# Patient Record
Sex: Female | Born: 1950 | Race: White | Hispanic: No | Smoking: Current every day smoker
Health system: Southern US, Community
[De-identification: ages and names within clinical notes are randomized; demographics above are authoritative.]

## PROBLEM LIST (undated history)

## (undated) DIAGNOSIS — C801 Malignant (primary) neoplasm, unspecified: Secondary | ICD-10-CM

## (undated) DIAGNOSIS — F319 Bipolar disorder, unspecified: Secondary | ICD-10-CM

## (undated) DIAGNOSIS — E785 Hyperlipidemia, unspecified: Secondary | ICD-10-CM

## (undated) DIAGNOSIS — F209 Schizophrenia, unspecified: Secondary | ICD-10-CM

## (undated) DIAGNOSIS — E041 Nontoxic single thyroid nodule: Secondary | ICD-10-CM

## (undated) DIAGNOSIS — I517 Cardiomegaly: Secondary | ICD-10-CM

## (undated) DIAGNOSIS — N809 Endometriosis, unspecified: Secondary | ICD-10-CM

## (undated) DIAGNOSIS — E559 Vitamin D deficiency, unspecified: Secondary | ICD-10-CM

## (undated) DIAGNOSIS — F259 Schizoaffective disorder, unspecified: Secondary | ICD-10-CM

## (undated) DIAGNOSIS — F329 Major depressive disorder, single episode, unspecified: Secondary | ICD-10-CM

## (undated) DIAGNOSIS — M810 Age-related osteoporosis without current pathological fracture: Secondary | ICD-10-CM

## (undated) DIAGNOSIS — C50919 Malignant neoplasm of unspecified site of unspecified female breast: Secondary | ICD-10-CM

## (undated) DIAGNOSIS — F32A Depression, unspecified: Secondary | ICD-10-CM

## (undated) DIAGNOSIS — J439 Emphysema, unspecified: Secondary | ICD-10-CM

## (undated) HISTORY — DX: Bipolar disorder, unspecified: F31.9

## (undated) HISTORY — DX: Schizoaffective disorder, unspecified: F25.9

## (undated) HISTORY — PX: TENDON REPAIR: SHX5111

## (undated) HISTORY — PX: ABDOMINAL HYSTERECTOMY: SHX81

## (undated) HISTORY — PX: TONSILLECTOMY: SHX5217

## (undated) HISTORY — DX: Nontoxic single thyroid nodule: E04.1

## (undated) HISTORY — DX: Vitamin D deficiency, unspecified: E55.9

## (undated) HISTORY — DX: Major depressive disorder, single episode, unspecified: F32.9

## (undated) HISTORY — PX: DILATION AND CURETTAGE OF UTERUS: SHX78

## (undated) HISTORY — DX: Depression, unspecified: F32.A

## (undated) HISTORY — DX: Hyperlipidemia, unspecified: E78.5

## (undated) HISTORY — DX: Schizophrenia, unspecified: F20.9

## (undated) HISTORY — PX: TUBAL LIGATION: SHX77

## (undated) HISTORY — DX: Age-related osteoporosis without current pathological fracture: M81.0

## (undated) HISTORY — DX: Cardiomegaly: I51.7

## (undated) HISTORY — DX: Emphysema, unspecified: J43.9

## (undated) HISTORY — DX: Endometriosis, unspecified: N80.9

## (undated) HISTORY — DX: Malignant (primary) neoplasm, unspecified: C80.1

---

## 2014-04-20 HISTORY — PX: MASTECTOMY: SHX3

## 2014-12-04 HISTORY — PX: DIAGNOSTIC MAMMOGRAM: HXRAD719

## 2014-12-11 ENCOUNTER — Encounter: Payer: Self-pay | Admitting: General Surgery

## 2014-12-11 ENCOUNTER — Ambulatory Visit (INDEPENDENT_AMBULATORY_CARE_PROVIDER_SITE_OTHER): Payer: Medicaid Other | Admitting: General Surgery

## 2014-12-11 ENCOUNTER — Other Ambulatory Visit: Payer: Medicaid Other

## 2014-12-11 VITALS — BP 110/70 | HR 84 | Resp 16 | Ht 64.0 in | Wt 123.0 lb

## 2014-12-11 DIAGNOSIS — N63 Unspecified lump in breast: Secondary | ICD-10-CM | POA: Diagnosis not present

## 2014-12-11 DIAGNOSIS — C801 Malignant (primary) neoplasm, unspecified: Secondary | ICD-10-CM

## 2014-12-11 DIAGNOSIS — N6320 Unspecified lump in the left breast, unspecified quadrant: Secondary | ICD-10-CM

## 2014-12-11 HISTORY — DX: Malignant (primary) neoplasm, unspecified: C80.1

## 2014-12-11 HISTORY — PX: BREAST BIOPSY: SHX20

## 2014-12-11 NOTE — Progress Notes (Signed)
Patient ID: Selena Pittman, female   DOB: 1951-03-10, 64 y.o.   MRN: [id number]  Chief Complaint  Patient presents with  . Other    Evaluation of Left Breast Mammogram    HPI Selena Pittman is a 64 y.o. female.  who presents for a breast evaluation. The most recent mammogram was done on 11-01-14.  She can not feel anything different in the breast.  Left breast ultrasound was 12-04-14.  She is a resident of [facility]. She is here today with her caregiver, [facility].  Mother with history of uterine cancer.   HPI  Past Medical History  Diagnosis Date  . Vitamin D deficiency   . Schizophrenia   . Bipolar affective   . Schizoaffective disorder   . Osteoporosis   . Cardiomegaly   . Emphysema of lung   . Depression   . Hyperlipidemia   . Thyroid nodule   . Endometriosis     Past Surgical History  Procedure Laterality Date  . Diagnostic mammogram  12/04/2014    Done at [facility] Imaging Category 5-Left Breast  . Tendon repair Right     hand  . Dilation and curettage of uterus    . Tubal ligation    . Tonsillectomy    . Abdominal hysterectomy      Family History  Problem Relation Age of Onset  . Cancer Mother     uterine  . Heart attack Father     Social History Social History  Substance Use Topics  . Smoking status: Current Every Day Smoker -- 0.20 packs/day for 45 years    Types: Cigarettes  . Smokeless tobacco: Never Used  . Alcohol Use: No    No Known Allergies  Current Outpatient Prescriptions  Medication Sig Dispense Refill  . alendronate (FOSAMAX) 70 MG tablet Take 70 mg by mouth once a week. Take with a full glass of water on an empty stomach.    . cetirizine (ZYRTEC) 10 MG tablet Take 10 mg by mouth daily.    . cholecalciferol (VITAMIN D) 1000 UNITS tablet Take 2,000 Units by mouth daily.    . cloZAPine (CLOZARIL) 100 MG tablet Take 400 mg by mouth daily.    . hydrOXYzine (ATARAX/VISTARIL) 25 MG tablet Take 25 mg by mouth 2 (two) times  daily.    Marland Kitchen ketoconazole (NIZORAL) 2 % cream Apply 1 application topically daily as needed for irritation.    Marland Kitchen LORazepam (ATIVAN) 0.5 MG tablet Take 0.5 mg by mouth at bedtime.    Marland Kitchen omeprazole (PRILOSEC) 20 MG capsule Take 20 mg by mouth daily.    . simvastatin (ZOCOR) 20 MG tablet Take 20 mg by mouth daily.     No current facility-administered medications for this visit.    Review of Systems Review of Systems  Constitutional: Negative.   Respiratory: Negative.   Cardiovascular: Negative.     Blood pressure 110/70, pulse 84, resp. rate 16, height 5\' 4"  (1.626 m), weight 123 lb (55.792 kg).  Physical Exam Physical Exam  Constitutional: She appears well-developed.  HENT:  Mouth/Throat: Oropharynx is clear and moist.  Eyes: Conjunctivae are normal. No scleral icterus.  Neck: Neck supple.  Cardiovascular: Normal rate, regular rhythm and normal heart sounds.   Pulmonary/Chest: Effort normal. Right breast exhibits no inverted nipple, no mass, no nipple discharge, no skin change and no tenderness. Left breast exhibits no inverted nipple, no mass, no nipple discharge, no skin change and no tenderness.  Abdominal: Soft. Bowel sounds are normal. There  is no hepatomegaly. There is no tenderness.  Lymphadenopathy:    She has no cervical adenopathy.    She has no axillary adenopathy.  Skin: Skin is warm and dry.    Data Reviewed Mammogram and ultrasound reviewed. 2 adjoining masses in UOQ of left breast, suspicious in appearance.  Assessment    Abnormal imaging of left breast.      Plan    Core biopsy indicated and discussed with pt. She was agreeable and procedure completed today. Follow up to be announced after pathology available.      PCP:  [facility]  [facility] G 12/11/2014, 1:06 PM

## 2014-12-11 NOTE — Patient Instructions (Signed)

## 2014-12-12 ENCOUNTER — Telehealth: Payer: Self-pay | Admitting: *Deleted

## 2014-12-12 NOTE — Telephone Encounter (Signed)
Call from Dr Saralyn Pilar, recent left breast biopsy showed invasive carcinoma and in situ.

## 2014-12-12 NOTE — Progress Notes (Signed)
Patient has been scheduled for an appointment on 12-20-14 at 3:30 pm.

## 2014-12-20 ENCOUNTER — Encounter: Payer: Self-pay | Admitting: General Surgery

## 2014-12-20 ENCOUNTER — Ambulatory Visit (INDEPENDENT_AMBULATORY_CARE_PROVIDER_SITE_OTHER): Payer: Medicaid Other | Admitting: General Surgery

## 2014-12-20 VITALS — BP 132/78 | HR 88 | Resp 16 | Ht 64.0 in | Wt 124.0 lb

## 2014-12-20 DIAGNOSIS — C50912 Malignant neoplasm of unspecified site of left female breast: Secondary | ICD-10-CM | POA: Diagnosis not present

## 2014-12-20 NOTE — Patient Instructions (Signed)
Patient's surgery has been scheduled for 12-31-14 at [facility].

## 2014-12-20 NOTE — Progress Notes (Signed)
Patient ID: Selena [facility]man, female   DOB: [birth year], 64 y.o.   MRN: [id number]  Chief Complaint  Patient presents with  . Follow-up    HPI Selena Pittman is a 64 y.o. female.  Here today to discuss treatment options for left breast cancer. She is here today with her son, [name] and her daughter, Maudie Mercury.   HPI  Past Medical History  Diagnosis Date  . Vitamin D deficiency   . Schizophrenia   . Bipolar affective   . Schizoaffective disorder   . Osteoporosis   . Cardiomegaly   . Emphysema of lung   . Depression   . Hyperlipidemia   . Thyroid nodule   . Endometriosis   . Cancer 12-11-14    INVASIVE MAMMARY CARCINOMA/.left breast    Past Surgical History  Procedure Laterality Date  . Diagnostic mammogram  12/04/2014    Done at [facility] Imaging Category 5-Left Breast  . Tendon repair Right     hand  . Dilation and curettage of uterus    . Tubal ligation    . Tonsillectomy    . Abdominal hysterectomy    . Breast biopsy Left 12-11-14    INVASIVE MAMMARY CARCINOMA.    Family History  Problem Relation Age of Onset  . Cancer Mother     uterine  . Heart attack Father     Social History Social History  Substance Use Topics  . Smoking status: Current Every Day Smoker -- 0.20 packs/day for 45 years    Types: Cigarettes  . Smokeless tobacco: Never Used  . Alcohol Use: No    No Known Allergies  Current Outpatient Prescriptions  Medication Sig Dispense Refill  . alendronate (FOSAMAX) 70 MG tablet Take 70 mg by mouth once a week. Take with a full glass of water on an empty stomach.    . cetirizine (ZYRTEC) 10 MG tablet Take 10 mg by mouth daily.    . cholecalciferol (VITAMIN D) 1000 UNITS tablet Take 2,000 Units by mouth daily.    . cloZAPine (CLOZARIL) 100 MG tablet Take 400 mg by mouth daily.    . hydrOXYzine (ATARAX/VISTARIL) 25 MG tablet Take 25 mg by mouth 2 (two) times daily.    Marland Kitchen ketoconazole (NIZORAL) 2 % cream Apply 1 application topically daily as needed for  irritation.    Marland Kitchen LORazepam (ATIVAN) 0.5 MG tablet Take 0.5 mg by mouth at bedtime.    Marland Kitchen omeprazole (PRILOSEC) 20 MG capsule Take 20 mg by mouth daily.    . simvastatin (ZOCOR) 20 MG tablet Take 20 mg by mouth daily.     No current facility-administered medications for this visit.    Review of Systems Review of Systems  Constitutional: Negative.   Respiratory: Negative.   Cardiovascular: Negative.   Gastrointestinal: Positive for nausea.    Blood pressure 132/78, pulse 88, resp. rate 16, height 5\' 4"  (1.626 m), weight 124 lb (56.246 kg).  Physical Exam Physical Exam  Constitutional: She is oriented to person, place, and time. She appears well-developed and well-nourished.  Pulmonary/Chest:  Left breast biopsy site with steristrips intact, small bruise noted.  Neurological: She is alert and oriented to person, place, and time.  Skin: Skin is warm and dry.  Psychiatric: She has a normal mood and affect.    Data Reviewed Pathology.  Assessment    Left breast cancer ER/PR pos, Her 2 negative     Plan    Discussed risk and benefits in detail regarding treatment options. Schedule left breast  lumpectomy with SN biopsy this was recommended and pt agreed.  Procedure explained to her   She, her son and daughter are aware of possible need for axillary dissection, role of radiation. Need for chemo can be assessed after local treatment and final staging is complete. Patient's surgery has been scheduled for 12-31-14 at [facility].   PCP:  Lorelee Market  [facility] G 12/20/2014, 4:08 PM

## 2014-12-21 ENCOUNTER — Telehealth: Payer: Self-pay

## 2014-12-21 NOTE — Telephone Encounter (Signed)
Spoke with Axel Filler, patient's caregiver and [name], the patient's son and guardian regarding her surgery. Patient is scheduled for surgery at [facility] on 12/31/14. She will pre admit at the hospital on 12/27/14 at 9:30 am. She is to arrive on 12/31/14 at 8:45 am at the Radiology desk. The patient's caregiver and son are aware of dates, times, and instructions.

## 2014-12-27 ENCOUNTER — Encounter
Admission: RE | Admit: 2014-12-27 | Discharge: 2014-12-27 | Disposition: A | Discharge status: Home / Self Care | Payer: Medicaid Other | Source: Ambulatory Visit | Attending: General Surgery | Admitting: General Surgery

## 2014-12-27 DIAGNOSIS — Z01812 Encounter for preprocedural laboratory examination: Secondary | ICD-10-CM | POA: Diagnosis not present

## 2014-12-27 DIAGNOSIS — Z01810 Encounter for preprocedural cardiovascular examination: Secondary | ICD-10-CM | POA: Diagnosis present

## 2014-12-27 DIAGNOSIS — I517 Cardiomegaly: Secondary | ICD-10-CM | POA: Insufficient documentation

## 2014-12-27 LAB — BASIC METABOLIC PANEL
Anion gap: 10 (ref 5–15)
BUN: 9 mg/dL (ref 6–20)
CALCIUM: 9.2 mg/dL (ref 8.9–10.3)
CO2: 23 mmol/L (ref 22–32)
CREATININE: 0.84 mg/dL (ref 0.44–1.00)
Chloride: 109 mmol/L (ref 101–111)
GFR calc non Af Amer: >60 mL/min (ref >60)
GLUCOSE: 180 mg/dL — AB (ref 65–99)
Potassium: 3 mmol/L — ABNORMAL LOW (ref 3.5–5.1)
Sodium: 142 mmol/L (ref 135–145)

## 2014-12-27 LAB — CBC
HEMATOCRIT: 41.7 % (ref 35.0–47.0)
Hemoglobin: 14 g/dL (ref 12.0–16.0)
MCH: 30.8 pg (ref 26.0–34.0)
MCHC: 33.6 g/dL (ref 32.0–36.0)
MCV: 91.8 fL (ref 80.0–100.0)
Platelets: 162 10*3/uL (ref 150–440)
RBC: 4.54 MIL/uL (ref 3.80–5.20)
RDW: 12.4 % (ref 11.5–14.5)
WBC: 4.1 10*3/uL (ref 3.6–11.0)

## 2014-12-27 NOTE — Patient Instructions (Addendum)
  Your procedure is scheduled on: Monday Sept 12, 2016 at 8:30am Report to radiology-nuclear medicine desk.   Remember: Instructions that are not followed completely may result in serious medical risk, up to and including death, or upon the discretion of your surgeon and anesthesiologist your surgery may need to be rescheduled.    _x___ 1. Do not eat food or drink liquids after midnight. No gum chewing or hard candies.     ____ 2. No Alcohol for 24 hours before or after surgery.   ____ 3. Bring all medications with you on the day of surgery if instructed.    _x___ 4. Notify your doctor if there is any change in your medical condition     (cold, fever, infections).     Do not wear jewelry, make-up, hairpins, clips or nail polish.  Do not wear lotions, powders, or perfumes. You may wear deodorant.  Do not shave 48 hours prior to surgery. Men may shave face and neck.  Do not bring valuables to the hospital.    [facility] is not responsible for any belongings or valuables.               Contacts, dentures or bridgework may not be worn into surgery.  Leave your suitcase in the car. After surgery it may be brought to your room.  For patients admitted to the hospital, discharge time is determined by your treatment team.   Patients discharged the day of surgery will not be allowed to drive home.    Please read over the following fact sheets that you were given:   [facility] Preparing for Surgery  _x___ Take these medicines the morning of surgery with A SIP OF WATER:    1. cloZAPine (CLOZARIL)  2. hydrOXYzine (ATARAX/VISTARIL)  3. omeprazole (PRILOSEC  4.simvastatin (ZOCOR) Please take the above meds is they are scheduled in the am with just enough water to get them down.  If the meds are schedule in the pm, take them as usual.  ____ Fleet Enema (as directed)   _x__ Use CHG Soap as directed  ____ Use inhalers on the day of surgery  ____ Stop metformin 2 days prior to  surgery    ____ Take 1/2 of usual insulin dose the night before surgery and none on the morning of surgery.   ____ Stop Coumadin/Plavix/aspirin on does not apply  _x___ Stop Anti-inflammatories on does not apply.  Tylenol ok to take for  Pain.   ____ Stop supplements until after surgery.    ____ Bring C-Pap to the hospital.

## 2014-12-28 ENCOUNTER — Telehealth: Payer: Self-pay

## 2014-12-28 DIAGNOSIS — E876 Hypokalemia: Secondary | ICD-10-CM

## 2014-12-28 MED ORDER — POTASSIUM CHLORIDE 20 MEQ PO PACK: 20.0000 meq | PACK | Freq: Two times a day (BID) | ORAL | Status: DC

## 2014-12-28 NOTE — Telephone Encounter (Signed)
Spoke with patient's caregiver Zigmund Daniel and let her know that she is to start the potassium supplement today. She is aware and will pick this up for the patient.

## 2014-12-28 NOTE — Telephone Encounter (Signed)
-----   Message from Christene Lye, MD sent at 12/27/2014  6:50 PM EDT ----- Need to correct potassium-.kcl 59meq bid, # 20.please inform pt's caregiver. I put the order in

## 2014-12-28 NOTE — Telephone Encounter (Signed)
Dr Jamal Collin unable to place order. Order placed for kcl 20 meq, bid, # 20.

## 2014-12-28 NOTE — Telephone Encounter (Signed)
-----   Message from Christene Lye, MD sent at 12/27/2014  6:50 PM EDT ----- Need to correct potassium-.kcl 70meq bid, # 20.please inform pt's caregiver. I put the order in

## 2014-12-31 ENCOUNTER — Ambulatory Visit: Payer: Medicaid Other | Admitting: Anesthesiology

## 2014-12-31 ENCOUNTER — Ambulatory Visit: Payer: Medicaid Other

## 2014-12-31 ENCOUNTER — Ambulatory Visit
Admission: RE | Admit: 2014-12-31 | Discharge: 2014-12-31 | Disposition: A | Discharge status: Home / Self Care | Payer: Medicaid Other | Source: Ambulatory Visit | Attending: General Surgery | Admitting: General Surgery

## 2014-12-31 ENCOUNTER — Encounter: Payer: Self-pay | Admitting: *Deleted

## 2014-12-31 ENCOUNTER — Encounter (HOSPITAL_BASED_OUTPATIENT_CLINIC_OR_DEPARTMENT_OTHER): Payer: Medicaid Other | Admitting: General Surgery

## 2014-12-31 ENCOUNTER — Encounter
Admission: RE | Disposition: A | Discharge status: Home / Self Care | Payer: Self-pay | Source: Ambulatory Visit | Attending: General Surgery

## 2014-12-31 DIAGNOSIS — D0502 Lobular carcinoma in situ of left breast: Secondary | ICD-10-CM | POA: Insufficient documentation

## 2014-12-31 DIAGNOSIS — F209 Schizophrenia, unspecified: Secondary | ICD-10-CM | POA: Diagnosis not present

## 2014-12-31 DIAGNOSIS — F319 Bipolar disorder, unspecified: Secondary | ICD-10-CM | POA: Insufficient documentation

## 2014-12-31 DIAGNOSIS — E785 Hyperlipidemia, unspecified: Secondary | ICD-10-CM | POA: Insufficient documentation

## 2014-12-31 DIAGNOSIS — Z170 Estrogen receptor positive status [ER+]: Secondary | ICD-10-CM | POA: Diagnosis not present

## 2014-12-31 DIAGNOSIS — J439 Emphysema, unspecified: Secondary | ICD-10-CM | POA: Insufficient documentation

## 2014-12-31 DIAGNOSIS — C50912 Malignant neoplasm of unspecified site of left female breast: Secondary | ICD-10-CM

## 2014-12-31 DIAGNOSIS — E559 Vitamin D deficiency, unspecified: Secondary | ICD-10-CM | POA: Insufficient documentation

## 2014-12-31 DIAGNOSIS — F17210 Nicotine dependence, cigarettes, uncomplicated: Secondary | ICD-10-CM | POA: Diagnosis not present

## 2014-12-31 DIAGNOSIS — Z90710 Acquired absence of both cervix and uterus: Secondary | ICD-10-CM | POA: Diagnosis not present

## 2014-12-31 DIAGNOSIS — F259 Schizoaffective disorder, unspecified: Secondary | ICD-10-CM | POA: Diagnosis not present

## 2014-12-31 DIAGNOSIS — N6320 Unspecified lump in the left breast, unspecified quadrant: Secondary | ICD-10-CM

## 2014-12-31 DIAGNOSIS — R928 Other abnormal and inconclusive findings on diagnostic imaging of breast: Secondary | ICD-10-CM | POA: Diagnosis present

## 2014-12-31 DIAGNOSIS — Z79899 Other long term (current) drug therapy: Secondary | ICD-10-CM | POA: Insufficient documentation

## 2014-12-31 DIAGNOSIS — F329 Major depressive disorder, single episode, unspecified: Secondary | ICD-10-CM | POA: Insufficient documentation

## 2014-12-31 DIAGNOSIS — I517 Cardiomegaly: Secondary | ICD-10-CM | POA: Insufficient documentation

## 2014-12-31 DIAGNOSIS — M810 Age-related osteoporosis without current pathological fracture: Secondary | ICD-10-CM | POA: Insufficient documentation

## 2014-12-31 HISTORY — PX: BREAST LUMPECTOMY WITH SENTINEL LYMPH NODE BIOPSY: SHX5597

## 2014-12-31 LAB — POCT I-STAT 4, (NA,K, GLUC, HGB,HCT): Potassium: 4 mmol/L

## 2014-12-31 SURGERY — BREAST LUMPECTOMY WITH SENTINEL LYMPH NODE BX
Anesthesia: General | Laterality: Left | Wound class: Clean

## 2014-12-31 MED ORDER — ONDANSETRON HCL 4 MG/2ML IJ SOLN: 4.0000 mg | Freq: Once | INTRAMUSCULAR | Status: DC | PRN

## 2014-12-31 MED ORDER — TRAMADOL HCL 50 MG PO TABS
50.0000 mg | ORAL_TABLET | Freq: Four times a day (QID) | ORAL | Status: DC | PRN
Start: 2014-12-31 — End: 2015-02-12

## 2014-12-31 MED ORDER — ACETAMINOPHEN 10 MG/ML IV SOLN
INTRAVENOUS | Status: AC
  Filled 2014-12-31: qty 100

## 2014-12-31 MED ORDER — METHYLENE BLUE 1 % INJ SOLN
INTRAMUSCULAR | Status: AC
  Filled 2014-12-31: qty 10

## 2014-12-31 MED ORDER — CEFAZOLIN SODIUM-DEXTROSE 2-3 GM-% IV SOLR
2.0000 g | INTRAVENOUS | Status: AC
  Administered 2014-12-31: 2 g via INTRAVENOUS

## 2014-12-31 MED ORDER — LIDOCAINE HCL (CARDIAC) 20 MG/ML IV SOLN
INTRAVENOUS | Status: DC | PRN
  Administered 2014-12-31: 50 mg via INTRAVENOUS

## 2014-12-31 MED ORDER — ACETAMINOPHEN 10 MG/ML IV SOLN
INTRAVENOUS | Status: DC | PRN
  Administered 2014-12-31: 1000 mg via INTRAVENOUS

## 2014-12-31 MED ORDER — PROPOFOL 10 MG/ML IV BOLUS
INTRAVENOUS | Status: DC | PRN
  Administered 2014-12-31: 120 mg via INTRAVENOUS

## 2014-12-31 MED ORDER — FENTANYL CITRATE (PF) 100 MCG/2ML IJ SOLN
INTRAMUSCULAR | Status: DC | PRN
  Administered 2014-12-31 (×2): 50 ug via INTRAVENOUS
  Administered 2014-12-31: 100 ug via INTRAVENOUS

## 2014-12-31 MED ORDER — FENTANYL CITRATE (PF) 100 MCG/2ML IJ SOLN: 25.0000 ug | INTRAMUSCULAR | Status: DC | PRN

## 2014-12-31 MED ORDER — CEFAZOLIN SODIUM-DEXTROSE 2-3 GM-% IV SOLR
INTRAVENOUS | Status: AC
  Administered 2014-12-31: 2 g via INTRAVENOUS
  Filled 2014-12-31: qty 50

## 2014-12-31 MED ORDER — BUPIVACAINE HCL (PF) 0.5 % IJ SOLN
INTRAMUSCULAR | Status: AC
  Filled 2014-12-31: qty 30

## 2014-12-31 MED ORDER — CHLORHEXIDINE GLUCONATE 4 % EX LIQD: 1.0000 "application " | Freq: Once | CUTANEOUS | Status: DC

## 2014-12-31 MED ORDER — MIDAZOLAM HCL 2 MG/2ML IJ SOLN
INTRAMUSCULAR | Status: DC | PRN
  Administered 2014-12-31: 2 mg via INTRAVENOUS

## 2014-12-31 MED ORDER — ONDANSETRON HCL 4 MG/2ML IJ SOLN
INTRAMUSCULAR | Status: DC | PRN
  Administered 2014-12-31: 4 mg via INTRAVENOUS

## 2014-12-31 MED ORDER — TECHNETIUM TC 99M SULFUR COLLOID
1.0200 | Freq: Once | INTRAVENOUS | Status: DC | PRN
  Administered 2014-12-31: 1.02 via INTRAVENOUS
  Filled 2014-12-31: qty 1.02

## 2014-12-31 MED ORDER — LACTATED RINGERS IV SOLN
INTRAVENOUS | Status: DC
  Administered 2014-12-31: 10:00:00 via INTRAVENOUS

## 2014-12-31 MED ORDER — SODIUM CHLORIDE 0.9 % IJ SOLN
INTRAMUSCULAR | Status: AC
  Filled 2014-12-31: qty 10

## 2014-12-31 MED ORDER — SUCCINYLCHOLINE CHLORIDE 20 MG/ML IJ SOLN
INTRAMUSCULAR | Status: DC | PRN
  Administered 2014-12-31: 80 mg via INTRAVENOUS

## 2014-12-31 MED ORDER — LIDOCAINE HCL (PF) 1 % IJ SOLN
INTRAMUSCULAR | Status: AC
  Filled 2014-12-31: qty 2

## 2014-12-31 SURGICAL SUPPLY — 33 items
BLADE SURG 15 STRL SS SAFETY (BLADE) ×6 IMPLANT
BULB RESERV EVAC DRAIN JP 100C (MISCELLANEOUS) IMPLANT
CANISTER SUCT 1200ML W/VALVE (MISCELLANEOUS) ×3 IMPLANT
CHLORAPREP W/TINT 26ML (MISCELLANEOUS) ×3 IMPLANT
CLOSURE WOUND 1/2 X4 (GAUZE/BANDAGES/DRESSINGS)
CNTNR SPEC 2.5X3XGRAD LEK (MISCELLANEOUS) ×1
CONT SPEC 4OZ STER OR WHT (MISCELLANEOUS) ×2
CONTAINER SPEC 2.5X3XGRAD LEK (MISCELLANEOUS) ×1 IMPLANT
COVER PROBE FLX POLY STRL (MISCELLANEOUS) ×3 IMPLANT
DEVICE LOCALIZATION ULTRAWIRE (WIRE) ×2 IMPLANT
DRAIN CHANNEL JP 15F RND 16 (MISCELLANEOUS) IMPLANT
DRAPE LAPAROTOMY TRNSV 106X77 (MISCELLANEOUS) ×3 IMPLANT
GLOVE BIO SURGEON STRL SZ7 (GLOVE) ×15 IMPLANT
GOWN STRL REUS W/ TWL LRG LVL3 (GOWN DISPOSABLE) ×3 IMPLANT
GOWN STRL REUS W/TWL LRG LVL3 (GOWN DISPOSABLE) ×6
HARMONIC SCALPEL FOCUS (MISCELLANEOUS) IMPLANT
KIT RM TURNOVER STRD PROC AR (KITS) ×3 IMPLANT
LABEL OR SOLS (LABEL) ×3 IMPLANT
LIQUID BAND (GAUZE/BANDAGES/DRESSINGS) ×3 IMPLANT
MARGIN MAP 10MM (MISCELLANEOUS) ×3 IMPLANT
NDL SAFETY 22GX1.5 (NEEDLE) ×3 IMPLANT
NEEDLE HYPO 25X1 1.5 SAFETY (NEEDLE) ×3 IMPLANT
PACK BASIN MINOR ARMC (MISCELLANEOUS) ×3 IMPLANT
PAD GROUND ADULT SPLIT (MISCELLANEOUS) ×3 IMPLANT
SLEVE PROBE SENORX GAMMA FIND (MISCELLANEOUS) ×3 IMPLANT
STRIP CLOSURE SKIN 1/2X4 (GAUZE/BANDAGES/DRESSINGS) IMPLANT
SUT ETH BLK MONO 3 0 FS 1 12/B (SUTURE) ×3 IMPLANT
SUT MNCRL AB 3-0 PS2 27 (SUTURE) ×9 IMPLANT
SUT VIC AB 2-0 BRD 54 (SUTURE) ×3 IMPLANT
SUT VIC AB 2-0 CT2 27 (SUTURE) ×6 IMPLANT
SYRINGE 10CC LL (SYRINGE) ×3 IMPLANT
ULTRAWIRE LOCALIZATION DEVICE (WIRE) ×6
WATER STERILE IRR 1000ML POUR (IV SOLUTION) ×3 IMPLANT

## 2014-12-31 NOTE — Interval H&P Note (Signed)
History and Physical Interval Note:  12/31/2014 11:38 AM  [facility]  has presented today for surgery, with the diagnosis of CANCER LEFT BREAST  The various methods of treatment have been discussed with the patient and family. After consideration of risks, benefits and other options for treatment, the patient has consented to  Procedure(s): BREAST LUMPECTOMY WITH SENTINEL LYMPH NODE BX (Left) as a surgical intervention .  The patient's history has been reviewed, patient examined, no change in status, stable for surgery.  I have reviewed the patient's chart and labs.  Questions were answered to the patient's satisfaction.     SANKAR,SEEPLAPUTHUR G

## 2014-12-31 NOTE — Anesthesia Preprocedure Evaluation (Addendum)
Anesthesia Evaluation  Patient identified by MRN, date of birth, ID band Patient awake    Reviewed: Allergy & Precautions, NPO status , Patient's Chart, lab work & pertinent test results  Airway Mallampati: III       Dental  (+) Poor Dentition, Missing   Pulmonary COPD,  COPD inhaler, Current Smoker,    Pulmonary exam normal breath sounds clear to auscultation       Cardiovascular negative cardio ROS Normal cardiovascular exam     Neuro/Psych Depression Bipolar Disorder Schizophrenia    GI/Hepatic Neg liver ROS, GERD  Medicated and Controlled,  Endo/Other  negative endocrine ROS  Renal/GU negative Renal ROS  negative genitourinary   Musculoskeletal negative musculoskeletal ROS (+)   Abdominal Normal abdominal exam  (+)   Peds negative pediatric ROS (+)  Hematology negative hematology ROS (+)   Anesthesia Other Findings   Reproductive/Obstetrics                            Anesthesia Physical Anesthesia Plan  ASA: III  Anesthesia Plan: General   Post-op Pain Management:    Induction: Intravenous  Airway Management Planned: Oral ETT  Additional Equipment:   Intra-op Plan:   Post-operative Plan: Extubation in OR  Informed Consent: I have reviewed the patients History and Physical, chart, labs and discussed the procedure including the risks, benefits and alternatives for the proposed anesthesia with the patient or authorized representative who has indicated his/her understanding and acceptance.   Dental advisory given  Plan Discussed with: CRNA and Surgeon  Anesthesia Plan Comments:         Anesthesia Quick Evaluation

## 2014-12-31 NOTE — Anesthesia Postprocedure Evaluation (Signed)
  Anesthesia Post-op Note  Patient: [facility]  Procedure(s) Performed: Procedure(s): LEFT BREAST LUMPECTOMY WITH ULTRASOUND GUIDED NEEDLE LOCALIZATION, SENTINEL LYMPH NODE BIOPSY  (Left)  Anesthesia type:General  Patient location: PACU  Post pain: Pain level controlled  Post assessment: Post-op Vital signs reviewed, Patient's Cardiovascular Status Stable, Respiratory Function Stable, Patent Airway and No signs of Nausea or vomiting  Post vital signs: Reviewed and stable  Last Vitals:  Filed Vitals:   12/31/14 1339  BP: 128/85  Pulse: 90  Temp: 36.4 C  Resp: 16    Level of consciousness: awake, alert  and patient cooperative  Complications: No apparent anesthesia complications

## 2014-12-31 NOTE — Transfer of Care (Signed)
Immediate Anesthesia Transfer of Care Note  Patient: [facility]  Procedure(s) Performed: Procedure(s): LEFT BREAST LUMPECTOMY WITH ULTRASOUND GUIDED NEEDLE LOCALIZATION, SENTINEL LYMPH NODE BIOPSY  (Left)  Patient Location: PACU  Anesthesia Type:General  Level of Consciousness: sedated and responds to stimulation  Airway & Oxygen Therapy: Patient Spontanous Breathing and Patient connected to face mask oxygen  Post-op Assessment: Report given to RN and Post -op Vital signs reviewed and stable  Post vital signs: Reviewed and stable  Last Vitals:  Filed Vitals:   12/31/14 1339  BP: 128/85  Pulse: 90  Temp: 36.4 C  Resp: 16    Complications: No apparent anesthesia complications

## 2014-12-31 NOTE — Anesthesia Procedure Notes (Signed)
Procedure Name: Intubation Date/Time: 12/31/2014 11:56 AM Performed by: Jonna Clark Pre-anesthesia Checklist: Patient identified, Patient being monitored, Timeout performed, Emergency Drugs available and Suction available Patient Re-evaluated:Patient Re-evaluated prior to inductionOxygen Delivery Method: Circle system utilized Preoxygenation: Pre-oxygenation with 100% oxygen Intubation Type: IV induction Ventilation: Mask ventilation without difficulty Laryngoscope Size: Mac and 3 Grade View: Grade I Tube type: Oral Tube size: 7.0 mm Number of attempts: 1 Placement Confirmation: ETT inserted through vocal cords under direct vision,  positive ETCO2 and breath sounds checked- equal and bilateral Secured at: 21 cm Tube secured with: Tape Dental Injury: Teeth and Oropharynx as per pre-operative assessment

## 2014-12-31 NOTE — Op Note (Signed)
Preop diagnosis carcinoma left breast  Post op diagnosis: Same  Operation: Left breast lumpectomy with sentinel node biopsy. Wire localization of left breast mass with ultrasound guidance  Surgeon: S.G.Tayari Yankee  Assistant:     Anesthesia: Gen.  Complications: None  EBL: About 25 mL  Drains: None  Description: Patient underwent nuclear contrast injection preoperatively. She was then brought the operating room and put to sleep with an endotracheal tube.        Patient had 2 masses very close to each other 1 of which was biopsied showing invasive cancer. These were all located about the level about the 1:00 position the periphery of the left breast. With ultrasound guidance the mass with the clip in its place was identified and Ultra Wire positioned going through this. The breast and axilla were prepped and draped as sterile field. Sentinel node was performed first. Patient had intense signal activity in the anterior inferior axilla just behind the pectoral fold. Skin incision was made and deepened through the layers down to the fat pad. And a single 1 cm node was identified with intense signal activity. This was excised out and sent off as sentinel node. Subsequent frozen section did not reveal any metastatic disease. A second 3-4 mm node was identified with no signal activity and this was sent off as a non-sentinel node. Signal activity was completely resolved after removal of the initial sentinel node and there were no other visible or palpable findings in the axilla. The wound was closed with 2-0 Vicryl in the deeper tissue and the skin with subcuticular 3-0 Monocryl. A curvilinear incision was made from 112 to 1:30 positioned going just a little past the wire entrance which was the lateral aspect. Incision was deepened through the subcutaneous tissue was elevated partially on both sides. Using finger palpation and the wire guidance an adequate lumpectomy was performed containing 2 palpable  masses. The closest margin of any was probably and the skin but it appeared to be uninvolved with in pathologic evaluation suggested subsequently that the this was close but likely not involved. After ensuring hemostasis the deeper tissue was closed 2-0 Vicryl skin closed with subcuticular 3-0 Monocryl. [place]  ban was applied on both incisions. Patient subsequently returned recovery room stable condition.

## 2014-12-31 NOTE — H&P (View-Only) (Signed)
Patient ID: Selena [facility]man, female   DOB: 02/17/1951, 64 y.o.   MRN: [id number]  Chief Complaint  Patient presents with  . Other    Evaluation of Left Breast Mammogram    HPI Selena [facility]man is a 64 y.o. female.  who presents for a breast evaluation. The most recent mammogram was done on 11-01-14.  She can not feel anything different in the breast.  Left breast ultrasound was 12-04-14.  She is a resident of [facility]. She is here today with her caregiver, Linda.  Mother with history of uterine cancer.   HPI  Past Medical History  Diagnosis Date  . Vitamin D deficiency   . Schizophrenia   . Bipolar affective   . Schizoaffective disorder   . Osteoporosis   . Cardiomegaly   . Emphysema of lung   . Depression   . Hyperlipidemia   . Thyroid nodule   . Endometriosis     Past Surgical History  Procedure Laterality Date  . Diagnostic mammogram  12/04/2014    Done at [facility] Imaging Category 5-Left Breast  . Tendon repair Right     hand  . Dilation and curettage of uterus    . Tubal ligation    . Tonsillectomy    . Abdominal hysterectomy      Family History  Problem Relation Age of Onset  . Cancer Mother     uterine  . Heart attack Father     Social History Social History  Substance Use Topics  . Smoking status: Current Every Day Smoker -- 0.20 packs/day for 45 years    Types: Cigarettes  . Smokeless tobacco: Never Used  . Alcohol Use: No    No Known Allergies  Current Outpatient Prescriptions  Medication Sig Dispense Refill  . alendronate (FOSAMAX) 70 MG tablet Take 70 mg by mouth once a week. Take with a full glass of water on an empty stomach.    . cetirizine (ZYRTEC) 10 MG tablet Take 10 mg by mouth daily.    . cholecalciferol (VITAMIN D) 1000 UNITS tablet Take 2,000 Units by mouth daily.    . cloZAPine (CLOZARIL) 100 MG tablet Take 400 mg by mouth daily.    . hydrOXYzine (ATARAX/VISTARIL) 25 MG tablet Take 25 mg by mouth 2 (two) times  daily.    . ketoconazole (NIZORAL) 2 % cream Apply 1 application topically daily as needed for irritation.    . LORazepam (ATIVAN) 0.5 MG tablet Take 0.5 mg by mouth at bedtime.    . omeprazole (PRILOSEC) 20 MG capsule Take 20 mg by mouth daily.    . simvastatin (ZOCOR) 20 MG tablet Take 20 mg by mouth daily.     No current facility-administered medications for this visit.    Review of Systems Review of Systems  Constitutional: Negative.   Respiratory: Negative.   Cardiovascular: Negative.     Blood pressure 110/70, pulse 84, resp. rate 16, height 5' 4" (1.626 m), weight 123 lb (55.792 kg).  Physical Exam Physical Exam  Constitutional: She appears well-developed.  HENT:  Mouth/Throat: Oropharynx is clear and moist.  Eyes: Conjunctivae are normal. No scleral icterus.  Neck: Neck supple.  Cardiovascular: Normal rate, regular rhythm and normal heart sounds.   Pulmonary/Chest: Effort normal. Right breast exhibits no inverted nipple, no mass, no nipple discharge, no skin change and no tenderness. Left breast exhibits no inverted nipple, no mass, no nipple discharge, no skin change and no tenderness.  Abdominal: Soft. Bowel sounds are normal. There   is no hepatomegaly. There is no tenderness.  Lymphadenopathy:    She has no cervical adenopathy.    She has no axillary adenopathy.  Skin: Skin is warm and dry.    Data Reviewed Mammogram and ultrasound reviewed. 2 adjoining masses in UOQ of left breast, suspicious in appearance.  Assessment    Abnormal imaging of left breast.      Plan    Core biopsy indicated and discussed with pt. She was agreeable and procedure completed today. Follow up to be announced after pathology available.      PCP:  Niemeyer, Meindert  SANKAR,SEEPLAPUTHUR G 12/11/2014, 1:06 PM    

## 2014-12-31 NOTE — Discharge Instructions (Signed)

## 2015-01-01 ENCOUNTER — Encounter: Payer: Self-pay | Admitting: General Surgery

## 2015-01-03 LAB — SURGICAL PATHOLOGY

## 2015-01-07 ENCOUNTER — Ambulatory Visit (INDEPENDENT_AMBULATORY_CARE_PROVIDER_SITE_OTHER): Payer: Medicaid Other | Admitting: General Surgery

## 2015-01-07 VITALS — BP 120/78 | HR 68 | Resp 12 | Ht 64.0 in | Wt 123.0 lb

## 2015-01-07 DIAGNOSIS — C50912 Malignant neoplasm of unspecified site of left female breast: Secondary | ICD-10-CM

## 2015-01-07 NOTE — Progress Notes (Signed)
This is a 64 year old female here today for her post op left lumpectomy done on 12/31/14. Patient states she is doing well.  Left breast incision is clean and healing well.  Path report- the adjoining masses were in fact one, measuring over 2cm. Margins were positive. 2 ax nodes negative. Discussed need for either reexcision with hope of negative margins or a mastectomy. All aspects regarding these two procedures were discussed. Pt is not inclined to have mastectomy. Also noted that she likely will have a notably deformity of breast with reexcision and still may end up with positve margins   Patient to discuss all options with her son and daughter. Await her final decision.

## 2015-01-07 NOTE — Patient Instructions (Addendum)
Follow up appointment to be announced.  

## 2015-01-09 ENCOUNTER — Telehealth: Payer: Self-pay | Admitting: *Deleted

## 2015-01-09 ENCOUNTER — Other Ambulatory Visit: Payer: Self-pay | Admitting: General Surgery

## 2015-01-09 ENCOUNTER — Encounter: Payer: Self-pay | Admitting: General Surgery

## 2015-01-09 DIAGNOSIS — C50912 Malignant neoplasm of unspecified site of left female breast: Secondary | ICD-10-CM

## 2015-01-09 NOTE — Telephone Encounter (Signed)
Patient's son called the office to report that they have decided on a left total mastectomy.   Arrangements have been made for this to take place on Monday, 01-14-15.

## 2015-01-11 ENCOUNTER — Encounter: Payer: Self-pay | Admitting: *Deleted

## 2015-01-11 ENCOUNTER — Inpatient Hospital Stay: Admission: RE | Admit: 2015-01-11 | Payer: Self-pay | Source: Ambulatory Visit

## 2015-01-11 NOTE — Patient Instructions (Signed)
  Your procedure is scheduled on:01/14/15 Report to Day Surgery.medical mall second floor To find out your arrival time please call [phone] between 1PM - 3PM on 01/11/15  Remember: Instructions that are not followed completely may result in serious medical risk, up to and including death, or upon the discretion of your surgeon and anesthesiologist your surgery may need to be rescheduled.    __x__ 1. Do not eat food or drink liquids after midnight. No gum chewing or hard candies.     _x___ 2. No Alcohol for 24 hours before or after surgery.   ____ 3. Bring all medications with you on the day of surgery if instructed.    __x__ 4. Notify your doctor if there is any change in your medical condition     (cold, fever, infections).     Do not wear jewelry, make-up, hairpins, clips or nail polish.  Do not wear lotions, powders, or perfumes. You may wear deodorant.  Do not shave 48 hours prior to surgery. Men may shave face and neck.  Do not bring valuables to the hospital.    [facility] is not responsible for any belongings or valuables.               Contacts, dentures or bridgework may not be worn into surgery.  Leave your suitcase in the car. After surgery it may be brought to your room.  For patients admitted to the hospital, discharge time is determined by your                treatment team.   Patients discharged the day of surgery will not be allowed to drive home.   Please read over the following fact sheets that you were given:   Surgical Site Infection Prevention   ____ Take these medicines the morning of surgery with A SIP OF WATER:    1. omeprazole  2.   3.   4.  5.  6.  ____ Fleet Enema (as directed)   ____ Use CHG Soap as directed  ____ Use inhalers on the day of surgery  ____ Stop metformin 2 days prior to surgery    ____ Take 1/2 of usual insulin dose the night before surgery and none on the morning of surgery.   ____ Stop Coumadin/Plavix/aspirin on    ____ Stop Anti-inflammatories on   ____ Stop supplements until after surgery.    ____ Bring C-Pap to the hospital.

## 2015-01-14 ENCOUNTER — Ambulatory Visit: Payer: Medicaid Other | Admitting: *Deleted

## 2015-01-14 ENCOUNTER — Encounter (HOSPITAL_BASED_OUTPATIENT_CLINIC_OR_DEPARTMENT_OTHER): Payer: Medicaid Other | Admitting: General Surgery

## 2015-01-14 ENCOUNTER — Ambulatory Visit
Admission: RE | Admit: 2015-01-14 | Discharge: 2015-01-14 | Disposition: A | Discharge status: Home / Self Care | Payer: Medicaid Other | Source: Ambulatory Visit | Attending: General Surgery | Admitting: General Surgery

## 2015-01-14 ENCOUNTER — Encounter
Admission: RE | Disposition: A | Discharge status: Home / Self Care | Payer: Self-pay | Source: Ambulatory Visit | Attending: General Surgery

## 2015-01-14 ENCOUNTER — Encounter: Payer: Self-pay | Admitting: *Deleted

## 2015-01-14 DIAGNOSIS — F259 Schizoaffective disorder, unspecified: Secondary | ICD-10-CM | POA: Diagnosis not present

## 2015-01-14 DIAGNOSIS — F17210 Nicotine dependence, cigarettes, uncomplicated: Secondary | ICD-10-CM | POA: Diagnosis not present

## 2015-01-14 DIAGNOSIS — Z8049 Family history of malignant neoplasm of other genital organs: Secondary | ICD-10-CM | POA: Insufficient documentation

## 2015-01-14 DIAGNOSIS — C50912 Malignant neoplasm of unspecified site of left female breast: Secondary | ICD-10-CM | POA: Diagnosis not present

## 2015-01-14 DIAGNOSIS — K219 Gastro-esophageal reflux disease without esophagitis: Secondary | ICD-10-CM | POA: Diagnosis not present

## 2015-01-14 DIAGNOSIS — E559 Vitamin D deficiency, unspecified: Secondary | ICD-10-CM | POA: Insufficient documentation

## 2015-01-14 DIAGNOSIS — E785 Hyperlipidemia, unspecified: Secondary | ICD-10-CM | POA: Diagnosis not present

## 2015-01-14 DIAGNOSIS — F319 Bipolar disorder, unspecified: Secondary | ICD-10-CM | POA: Insufficient documentation

## 2015-01-14 DIAGNOSIS — Z8249 Family history of ischemic heart disease and other diseases of the circulatory system: Secondary | ICD-10-CM | POA: Insufficient documentation

## 2015-01-14 DIAGNOSIS — I517 Cardiomegaly: Secondary | ICD-10-CM | POA: Diagnosis not present

## 2015-01-14 DIAGNOSIS — J439 Emphysema, unspecified: Secondary | ICD-10-CM | POA: Insufficient documentation

## 2015-01-14 DIAGNOSIS — N809 Endometriosis, unspecified: Secondary | ICD-10-CM | POA: Diagnosis not present

## 2015-01-14 DIAGNOSIS — Z79899 Other long term (current) drug therapy: Secondary | ICD-10-CM | POA: Insufficient documentation

## 2015-01-14 DIAGNOSIS — M810 Age-related osteoporosis without current pathological fracture: Secondary | ICD-10-CM | POA: Diagnosis not present

## 2015-01-14 DIAGNOSIS — C50412 Malignant neoplasm of upper-outer quadrant of left female breast: Secondary | ICD-10-CM | POA: Diagnosis not present

## 2015-01-14 DIAGNOSIS — Z170 Estrogen receptor positive status [ER+]: Secondary | ICD-10-CM | POA: Insufficient documentation

## 2015-01-14 DIAGNOSIS — Z90710 Acquired absence of both cervix and uterus: Secondary | ICD-10-CM | POA: Insufficient documentation

## 2015-01-14 HISTORY — PX: SIMPLE MASTECTOMY WITH AXILLARY SENTINEL NODE BIOPSY: SHX6098

## 2015-01-14 SURGERY — SIMPLE MASTECTOMY
Anesthesia: General | Laterality: Left | Wound class: Clean Contaminated

## 2015-01-14 MED ORDER — PHENYLEPHRINE HCL 10 MG/ML IJ SOLN
INTRAMUSCULAR | Status: DC | PRN
  Administered 2015-01-14: 100 ug via INTRAVENOUS

## 2015-01-14 MED ORDER — CHLORHEXIDINE GLUCONATE 4 % EX LIQD: 1.0000 "application " | Freq: Once | CUTANEOUS | Status: DC

## 2015-01-14 MED ORDER — ACETAMINOPHEN 10 MG/ML IV SOLN
INTRAVENOUS | Status: AC
Start: 2015-01-14 — End: 2015-01-14
  Filled 2015-01-14: qty 100

## 2015-01-14 MED ORDER — KETOROLAC TROMETHAMINE 30 MG/ML IJ SOLN
INTRAMUSCULAR | Status: DC | PRN
  Administered 2015-01-14: 30 mg via INTRAVENOUS

## 2015-01-14 MED ORDER — HYDROMORPHONE HCL 1 MG/ML IJ SOLN
0.2500 mg | INTRAMUSCULAR | Status: DC | PRN
  Administered 2015-01-14 (×4): 0.5 mg via INTRAVENOUS

## 2015-01-14 MED ORDER — DEXAMETHASONE SODIUM PHOSPHATE 4 MG/ML IJ SOLN
8.0000 mg | Freq: Once | INTRAMUSCULAR | Status: DC | PRN
  Filled 2015-01-14: qty 2

## 2015-01-14 MED ORDER — FENTANYL CITRATE (PF) 100 MCG/2ML IJ SOLN
INTRAMUSCULAR | Status: DC | PRN
  Administered 2015-01-14: 50 ug via INTRAVENOUS
  Administered 2015-01-14: 100 ug via INTRAVENOUS
  Administered 2015-01-14 (×2): 50 ug via INTRAVENOUS

## 2015-01-14 MED ORDER — LACTATED RINGERS IV SOLN
INTRAVENOUS | Status: DC
Start: 2015-01-14 — End: 2015-01-14
  Administered 2015-01-14: 07:00:00 via INTRAVENOUS

## 2015-01-14 MED ORDER — CEFAZOLIN SODIUM-DEXTROSE 2-3 GM-% IV SOLR
2.0000 g | INTRAVENOUS | Status: AC
  Administered 2015-01-14: 2 g via INTRAVENOUS

## 2015-01-14 MED ORDER — ONDANSETRON HCL 4 MG/2ML IJ SOLN
INTRAMUSCULAR | Status: DC | PRN
  Administered 2015-01-14: 4 mg via INTRAVENOUS

## 2015-01-14 MED ORDER — ACETAMINOPHEN 10 MG/ML IV SOLN
INTRAVENOUS | Status: DC | PRN
  Administered 2015-01-14: 1000 mg via INTRAVENOUS

## 2015-01-14 MED ORDER — OXYCODONE-ACETAMINOPHEN 5-325 MG PO TABS: 1.0000 | ORAL_TABLET | ORAL | Status: DC | PRN

## 2015-01-14 MED ORDER — MIDAZOLAM HCL 2 MG/2ML IJ SOLN
INTRAMUSCULAR | Status: DC | PRN
  Administered 2015-01-14: 1 mg via INTRAVENOUS

## 2015-01-14 MED ORDER — PROPOFOL 10 MG/ML IV BOLUS
INTRAVENOUS | Status: DC | PRN
Start: 2015-01-14 — End: 2015-01-14
  Administered 2015-01-14: 120 mg via INTRAVENOUS

## 2015-01-14 MED ORDER — CEFAZOLIN SODIUM-DEXTROSE 2-3 GM-% IV SOLR
INTRAVENOUS | Status: AC
  Administered 2015-01-14: 2 g via INTRAVENOUS
  Filled 2015-01-14: qty 50

## 2015-01-14 MED ORDER — HYDROMORPHONE HCL 1 MG/ML IJ SOLN
INTRAMUSCULAR | Status: AC
  Filled 2015-01-14: qty 1

## 2015-01-14 MED ORDER — HYDROMORPHONE HCL 1 MG/ML IJ SOLN
INTRAMUSCULAR | Status: DC
Start: 2015-01-14 — End: 2015-01-14
  Filled 2015-01-14: qty 1

## 2015-01-14 SURGICAL SUPPLY — 50 items
APPLIER CLIP 11 MED OPEN (CLIP)
APPLIER CLIP 13 LRG OPEN (CLIP)
BLADE SURG 10 STRL SS SAFETY (BLADE) ×3 IMPLANT
BULB RESERV EVAC DRAIN JP 100C (MISCELLANEOUS) ×3 IMPLANT
CANISTER SUCT 1200ML W/VALVE (MISCELLANEOUS) ×3 IMPLANT
CHLORAPREP W/TINT 26ML (MISCELLANEOUS) ×6 IMPLANT
CLIP APPLIE 11 MED OPEN (CLIP) IMPLANT
CLIP APPLIE 13 LRG OPEN (CLIP) IMPLANT
CNTNR SPEC 2.5X3XGRAD LEK (MISCELLANEOUS)
CONT SPEC 4OZ STER OR WHT (MISCELLANEOUS)
CONTAINER SPEC 2.5X3XGRAD LEK (MISCELLANEOUS) IMPLANT
DEVICE DISSECT PLASMABLAD 3.0S (MISCELLANEOUS) ×1 IMPLANT
DRAIN CHANNEL JP 15F RND 16 (MISCELLANEOUS) ×3 IMPLANT
DRAPE LAPAROTOMY TRNSV 106X77 (MISCELLANEOUS) ×3 IMPLANT
DRSG TEGADERM 2-3/8X2-3/4 SM (GAUZE/BANDAGES/DRESSINGS) ×3 IMPLANT
DRSG TELFA 3X8 NADH (GAUZE/BANDAGES/DRESSINGS) ×3 IMPLANT
ELECT CAUTERY BLADE 6.4 (BLADE) ×3 IMPLANT
GAUZE FLUFF 18X24 1PLY STRL (GAUZE/BANDAGES/DRESSINGS) ×3 IMPLANT
GAUZE SPONGE 4X4 12PLY STRL (GAUZE/BANDAGES/DRESSINGS) ×3 IMPLANT
GAUZE SPONGE NON-WVN 2X2 STRL (MISCELLANEOUS) IMPLANT
GLOVE BIO SURGEON STRL SZ7 (GLOVE) ×21 IMPLANT
GOWN STRL REUS W/ TWL LRG LVL3 (GOWN DISPOSABLE) ×4 IMPLANT
GOWN STRL REUS W/TWL LRG LVL3 (GOWN DISPOSABLE) ×8
HARMONIC SCALPEL FOCUS (MISCELLANEOUS) IMPLANT
KIT RM TURNOVER STRD PROC AR (KITS) ×3 IMPLANT
LABEL OR SOLS (LABEL) ×3 IMPLANT
LIQUID BAND (GAUZE/BANDAGES/DRESSINGS) ×3 IMPLANT
PACK BASIN MINOR ARMC (MISCELLANEOUS) ×3 IMPLANT
PAD ABD DERMACEA PRESS 5X9 (GAUZE/BANDAGES/DRESSINGS) ×3 IMPLANT
PAD GROUND ADULT SPLIT (MISCELLANEOUS) ×3 IMPLANT
PLASMABLADE 3.0S (MISCELLANEOUS) ×3
SLEVE PROBE SENORX GAMMA FIND (MISCELLANEOUS) IMPLANT
SPONGE LAP 18X18 5 PK (GAUZE/BANDAGES/DRESSINGS) ×3 IMPLANT
SPONGE VERSALON 2X2 STRL (MISCELLANEOUS)
SURGI-BRA LG (MISCELLANEOUS) ×3 IMPLANT
SUT ETHILON 3-0 FS-10 30 BLK (SUTURE) ×3
SUT MNCRL AB 3-0 PS2 27 (SUTURE) ×6 IMPLANT
SUT SILK 2 0 (SUTURE)
SUT SILK 2-0 18XBRD TIE 12 (SUTURE) IMPLANT
SUT SILK 3 0 (SUTURE) ×2
SUT SILK 3-0 18XBRD TIE 12 (SUTURE) ×1 IMPLANT
SUT SILK 4 0 (SUTURE)
SUT SILK 4-0 18XBRD TIE 12 (SUTURE) IMPLANT
SUT VIC AB 2-0 CT1 27 (SUTURE) ×2
SUT VIC AB 2-0 CT1 TAPERPNT 27 (SUTURE) ×1 IMPLANT
SUT VICRYL+ 3-0 144IN (SUTURE) ×3 IMPLANT
SUTURE EHLN 3-0 FS-10 30 BLK (SUTURE) ×1 IMPLANT
TUBING CONNECTING 10 (TUBING) ×2 IMPLANT
TUBING CONNECTING 10' (TUBING) ×1
WATER STERILE IRR 1000ML POUR (IV SOLUTION) ×3 IMPLANT

## 2015-01-14 NOTE — Anesthesia Postprocedure Evaluation (Signed)
  Anesthesia Post-op Note  Patient: Selena Pittman  Procedure(s) Performed: Procedure(s): SIMPLE MASTECTOMY (Left)  Anesthesia type:General  Patient location: PACU  Post pain: Pain level controlled  Post assessment: Post-op Vital signs reviewed, Patient's Cardiovascular Status Stable, Respiratory Function Stable, Patent Airway and No signs of Nausea or vomiting  Post vital signs: Reviewed and stable  Last Vitals:  Filed Vitals:   01/14/15 1020  BP: 116/73  Pulse: 84  Temp: 35.7 C  Resp: 16    Level of consciousness: awake, alert  and patient cooperative  Complications: No apparent anesthesia complications

## 2015-01-14 NOTE — Discharge Instructions (Signed)
Breast Biopsy A breast biopsy is a procedure where a sample of breast tissue is removed from your breast. The tissue is examined under a microscope to see if cancerous cells are present. A breast biopsy is done when there is:  Any undiagnosed breast mass (tumor).  Nipple abnormalities, dimpling, crusting, or ulcerations.  Abnormal discharge from the nipple, especially blood.  Redness, swelling, and pain of the breast.  Calcium deposits (calcifications) or abnormalities seen on a mammogram, ultrasound result, or results of magnetic resonance imaging (MRI).  Suspicious changes in the breast seen on your mammogram. If the tumor is found to be cancerous (malignant), a breast biopsy can help to determine what the best treatment is for you. There are many different types of breast biopsies. Talk to your caregiver about your options and which type is best for you. LET YOUR CAREGIVER KNOW ABOUT:  Allergies to food or medicine.  Medicines taken, including vitamins, herbs, eyedrops, over-the-counter medicines, and creams.  Use of steroids (by mouth or creams).  Previous problems with anesthetics or numbing medicines.  History of bleeding problems or blood clots.  Previous surgery.  Other health problems, including diabetes and kidney problems.  Any recent colds or infections.  Possibility of pregnancy, if this applies. RISKS AND COMPLICATIONS   Bleeding.  Infection.  Allergy to medicines.  Bruising and swelling of the breast.  Alteration in the shape of the breast.  Not finding the lump or abnormality.  Needing more surgery. BEFORE THE PROCEDURE  Arrange for someone to drive you home after the procedure.  Do not smoke for 2 weeks before the procedure. Stop smoking, if you smoke.  Do not drink alcohol for 24 hours before procedure.  Wear a good support bra to the procedure. PROCEDURE  You may be given a medicine to numb the breast area (local anesthesia) or a medicine  to make you sleep (general anesthesia) during the procedure. The following are the different types of biopsies that can be performed.   Fine-needle aspiration--A thin needle is attached to a syringe and inserted into the breast lump. Fluid and cells are removed and then looked at under a microscope. If the breast lump cannot be felt, an ultrasound may be used to help locate the lump and place the needle in the correct area.   Core needle biopsy--A wide, hollow needle (core needle) is inserted into the breast lump 3-6 times to get tissue samples or cores. The samples are removed. The needle is usually placed in the correct area by using an ultrasound or X-ray.   Stereotactic biopsy--X-ray equipment and a computer are used to analyze X-ray pictures of the breast lump. The computer then finds exactly where the core needle needs to be inserted. Tissue samples are removed.   Vacuum-assisted biopsy--A small incision (less than  inch) is made in your breast. A biopsy device that includes a hollow needle and vacuum is passed through the incision and into the breast tissue. The vacuum gently draws abnormal breast tissue into the needle to remove it. This type of biopsy removes a larger tissue sample than a regular core needle biopsy. No stitches are needed, and there is usually little scarring.  Ultrasound-guided core needle biopsy--A high frequency ultrasound helps guide the core needle to the area of the mass or abnormality. An incision is made to insert the needle. Tissue samples are removed.  Open biopsy--A larger incision is made in the breast. Your caregiver will attempt to remove the whole breast lump or  as much as possible. AFTER THE PROCEDURE  You will be taken to the recovery area. If you are doing well and have no problems, you will be allowed to go home.  You may notice bruising on your breast. This is normal.  Your caregiver may apply a pressure dressing on your breast for 24-48 hours. A  pressure dressing is a bandage that is wrapped tightly around the chest to stop fluid from collecting underneath tissues. Document Released: 04/06/2005 Document Revised: 08/01/2012 Document Reviewed: 05/07/2011 [facility] Patient Information 2015 [place], [place]. This information is not intended to replace advice given to you by your health care provider. Make sure you discuss any questions you have with your health care provider.  Breast Cancer Breast cancer is an abnormal growth of tissue (tumor) in the breast that is cancerous (malignant). Unlike noncancerous (benign) tumors, malignant tumors can spread to other parts of your body. The most common type of female breast cancer begins in the milk ducts (ductal carcinoma). Breast cancer is one of the most common types of cancer in women. CAUSES  The exact cause of female breast cancer is unknown.  RISK FACTORS  Age older than 59 years.  Family history of breast cancer.  Having the BRCA1 and BRCA2 genes.  Personal history of radiation exposure.  Obesity.  Menstrual periods that begin before age 25 years.  Menopause that begins after age 57 years.  Pregnant for the first time at the age of 48 years or older.  Using hormone therapy.  Drinking more than one alcoholic drink per day. SIGNS AND SYMPTOMS   A painless lump in your breast.  Changes in the size or shape of your breast.  Breast skin changes, such as puckering or dimpling.  Nipple abnormalities, such as scaling, crustiness, redness, or pulling in (retraction).  Nipple discharge that is bloody or clear. DIAGNOSIS  Your health care provider will ask about your medical history. He or she may also perform a number of procedures, such as:  A physical exam. This will involve feeling the tissue around the breast and under the arms.  Taking a sample of nipple discharge. The sample will be examined under a microscope.  Breast X-rays (mammogram), breast ultrasound exams, or an  MRI.  Taking a tissue sample (biopsy) from the breast. The sample will be examined under a microscope to look for cancer cells. Your cancer will be staged to determine its severity and extent. Staging is a careful attempt to find out the size of the tumor, whether the cancer has spread, and if so, to what parts of the body. You may need to have more tests to determine the stage of your cancer:  Stage 0--The tumor has not spread to other breast tissue.  Stage I--The cancer is only found in the breast. The tumor may be up to  in (2 cm) wide.  Stage II--The cancer has spread to nearby lymph nodes. The tumor may be up to 2 in (5 cm) wide.  Stage III--The cancer has spread to more distant lymph nodes. The tumor may be larger than 2 in (5 cm) wide.  Stage IV--The cancer has spread to other parts of the body, such as the bones, brain, liver, or lungs. TREATMENT  Depending on the type and stage, female breast cancer may be treated with one or more of the following therapies:  Surgery to remove just the tumor (lumpectomy) or the entire breast (mastectomy). Lymph nodes may also be removed.  Radiation therapy, which uses  high-energy rays to kill cancer cells.  Chemotherapy, which is the use of drugs to kill cancer cells.  Hormone therapy, which involves taking medicine to adjust the hormone levels in your body. You may take medicine to decrease your estrogen levels. This can help stop cancer cells from growing. HOME CARE INSTRUCTIONS   Take medicines only as directed by your health care provider.  Maintain a healthy diet.  Consider joining a support group. This may help you learn to cope with the stress of having breast cancer.  Keep all follow-up appointments as directed by your health care provider. SEEK MEDICAL CARE IF:  You have a sudden increase in pain.  You notice a new lump in either breast or under your arm.  You develop swelling in either arm or hand.  You lose weight without  trying.  You have a fever.  You notice new fatigue or weakness. SEEK IMMEDIATE MEDICAL CARE IF:   You have chest pain or trouble breathing.  You faint. Document Released: 07/15/2005 Document Revised: 08/21/2013 Document Reviewed: 05/31/2013 [facility] Patient Information 2015 [place], [place]. This information is not intended to replace advice given to you by your health care provider. Make sure you discuss any questions you have with your health care provider.

## 2015-01-14 NOTE — Transfer of Care (Signed)
Immediate Anesthesia Transfer of Care Note  Patient: Selena Pittman  Procedure(s) Performed: Procedure(s): SIMPLE MASTECTOMY (Left)  Patient Location: PACU  Anesthesia Type:General  Level of Consciousness: sedated and responds to stimulation  Airway & Oxygen Therapy: Patient Spontanous Breathing and Patient connected to face mask oxygen  Post-op Assessment: Report given to RN and Post -op Vital signs reviewed and stable  Post vital signs: Reviewed and stable  Last Vitals:  Filed Vitals:   01/14/15 0909  BP: 98/77  Pulse:   Temp: 35.7 C  Resp: 11    Complications: No apparent anesthesia complications

## 2015-01-14 NOTE — Op Note (Signed)
Preop diagnosis: Cancer of the left breast  Post op diagnosis: Same  Operation: Left total mastectomy  Surgeon: S.G.Sankar  Assistant:     Anesthesia: Gen.  Complications: None  EBL: Less than 50 mL  Drains: Blake drain  Description: This patient had recently undergone a left breast lumpectomy showing a large invasive lobular cancer with multiple positive margins. Given the small size of her breast and additional reexcision that wouldn't likely cause significant deformity and therefore mastectomy is being performed. She was brought to the operating room put to sleep in supine position the operating table. Timeout was performed. Left breast the adnexa regions were prepped and draped sterile field elliptical skin incision was mapped out to include the previous lumpectomy incision in the upper-outer quadrant, plasma knife was utilized skin incision was made and superior inferior flaps were created. Bleeding was controlled with the plasma knife and also with ligatures of 3-0 Vicryl. Superior dissection was performed to the infraclavicular space and inferiorly to the upper rectus fascia. The breast along the pectoral fascia was dissected dissected off the pectoralis muscle from medial to lateral aspect and removed. The lateral end of the mastectomy specimen was tagged and sent to pathology. After ensuring hemostasis the wound was irrigated and drained with a Blake drain brought out through a stab incision inferiorly. Drain was fastened to the skin with a nylon stitch. Wound was approximated with 2-0 Vicryl the subcutaneous tissue interrupted. Skin closed with 2 running subcuticulars sutures of 3-0 Monocryl. Area was covered with liqui  Ban. A Telfa along with the fluffs and ABDs and surgical bra used as dressing. Patient subsequently returned recovery room stable condition

## 2015-01-14 NOTE — Anesthesia Preprocedure Evaluation (Signed)
Anesthesia Evaluation  Patient identified by MRN, date of birth, ID band Patient awake    Reviewed: Allergy & Precautions, NPO status , Patient's Chart, lab work & pertinent test results  Airway Mallampati: I  TM Distance: >3 FB Neck ROM: Limited    Dental  (+) Poor Dentition, Missing   Pulmonary COPD,  COPD inhaler, Current Smoker,    Pulmonary exam normal        Cardiovascular negative cardio ROS Normal cardiovascular exam     Neuro/Psych Depression Schizophrenia    GI/Hepatic GERD  Medicated and Controlled,  Endo/Other    Renal/GU      Musculoskeletal   Abdominal (+)  Abdomen: soft.    Peds  Hematology   Anesthesia Other Findings   Reproductive/Obstetrics                             Anesthesia Physical Anesthesia Plan  ASA: III  Anesthesia Plan: General   Post-op Pain Management:    Induction: Intravenous  Airway Management Planned: LMA  Additional Equipment:   Intra-op Plan:   Post-operative Plan: Extubation in OR  Informed Consent: I have reviewed the patients History and Physical, chart, labs and discussed the procedure including the risks, benefits and alternatives for the proposed anesthesia with the patient or authorized representative who has indicated his/her understanding and acceptance.     Plan Discussed with: CRNA  Anesthesia Plan Comments:         Anesthesia Quick Evaluation

## 2015-01-14 NOTE — H&P (Signed)
Selena Pittman is an 64 y.o. female.   Chief Complaint: here for planned mastectomy left.  HPI: Pt with invasive lobular cancer left breast. Lumpectomy showed multiple pos margins. Given her small breast size and need for negative margins mastectomy was felt to be more acceptable. Pt has agreed.   Past Medical History  Diagnosis Date  . Vitamin D deficiency   . Schizophrenia   . Bipolar affective   . Schizoaffective disorder   . Osteoporosis   . Cardiomegaly   . Emphysema of lung   . Depression   . Hyperlipidemia   . Thyroid nodule   . Endometriosis   . Cancer 12-11-14    INVASIVE MAMMARY CARCINOMA/.left breast    Past Surgical History  Procedure Laterality Date  . Diagnostic mammogram  12/04/2014    Done at [facility] Category 5-Left Breast  . Tendon repair Right     hand  . Dilation and curettage of uterus    . Tubal ligation    . Tonsillectomy    . Abdominal hysterectomy    . Breast biopsy Left 12-11-14    INVASIVE MAMMARY CARCINOMA.  . Breast lumpectomy with sentinel lymph node biopsy Left 12/31/2014    Procedure: LEFT BREAST LUMPECTOMY WITH ULTRASOUND GUIDED NEEDLE LOCALIZATION, SENTINEL LYMPH NODE BIOPSY ;  Surgeon: Christene Lye, MD;  Location: [facility] ORS;  Service: General;  Laterality: Left;    Family History  Problem Relation Age of Onset  . Cancer Mother     uterine  . Heart attack Father    Social History:  reports that she has been smoking Cigarettes.  She has a 9 pack-year smoking history. She has never used smokeless tobacco. She reports that she does not drink alcohol or use illicit drugs.  Allergies: No Known Allergies  Medications Prior to Admission  Medication Sig Dispense Refill  . alendronate (FOSAMAX) 70 MG tablet Take 70 mg by mouth once a week. Take with a full glass of water on an empty stomach.    . cetirizine (ZYRTEC) 10 MG tablet Take 10 mg by mouth daily.    . cholecalciferol (VITAMIN D) 1000 UNITS tablet Take 2,000 Units by  mouth daily.    . cloZAPine (CLOZARIL) 100 MG tablet Take 400 mg by mouth daily.    . hydrOXYzine (ATARAX/VISTARIL) 25 MG tablet Take 25 mg by mouth 2 (two) times daily.    Marland Kitchen ketoconazole (NIZORAL) 2 % cream Apply 1 application topically daily as needed for irritation.    Marland Kitchen LORazepam (ATIVAN) 0.5 MG tablet Take 0.5 mg by mouth at bedtime.    Marland Kitchen omeprazole (PRILOSEC) 20 MG capsule Take 20 mg by mouth daily.    . potassium chloride (KLOR-CON) 20 MEQ packet Take 20 mEq by mouth 2 (two) times daily. 20 tablet 0  . simvastatin (ZOCOR) 20 MG tablet Take 20 mg by mouth daily.    . traMADol (ULTRAM) 50 MG tablet Take 1 tablet (50 mg total) by mouth every 6 (six) hours as needed. 30 tablet 0    No results found for this or any previous visit (from the past 48 hour(s)). No results found.  Review of Systems  Constitutional: Negative.   HENT: Negative.   Respiratory: Negative.   Cardiovascular: Negative.     Blood pressure 110/72, pulse 100, temperature 97.6 F (36.4 C), temperature source Oral, resp. rate 16, height 5\' 4"  (1.626 m), weight 123 lb (55.792 kg), SpO2 100 %. Physical Exam  Constitutional: She appears well-developed and well-nourished.  Eyes: Conjunctivae are normal.  Neck: Neck supple.  Cardiovascular: Normal rate, regular rhythm and normal heart sounds.   Respiratory: Effort normal and breath sounds normal.    GI: Soft. Bowel sounds are normal. There is no hepatomegaly. There is no tenderness.  Lymphadenopathy:    She has no cervical adenopathy.    She has no axillary adenopathy.     Assessment/Plan Invasive lobular cancer left breast,T2,N0. Pt decided on mastectomy. Will proceed accordingly.  SANKAR,SEEPLAPUTHUR G 01/14/2015, 7:00 AM

## 2015-01-14 NOTE — Anesthesia Procedure Notes (Signed)
Procedure Name: LMA Insertion Date/Time: 01/14/2015 7:32 AM Performed by: Jonna Clark Pre-anesthesia Checklist: Patient identified, Patient being monitored, Timeout performed, Emergency Drugs available and Suction available Patient Re-evaluated:Patient Re-evaluated prior to inductionOxygen Delivery Method: Circle system utilized Preoxygenation: Pre-oxygenation with 100% oxygen Intubation Type: IV induction Ventilation: Mask ventilation without difficulty LMA: LMA inserted LMA Size: 3.5 Tube type: Oral Number of attempts: 1 Placement Confirmation: positive ETCO2 and breath sounds checked- equal and bilateral Tube secured with: Tape Dental Injury: Teeth and Oropharynx as per pre-operative assessment

## 2015-01-17 ENCOUNTER — Ambulatory Visit (INDEPENDENT_AMBULATORY_CARE_PROVIDER_SITE_OTHER): Payer: Medicaid Other | Admitting: *Deleted

## 2015-01-17 DIAGNOSIS — C50912 Malignant neoplasm of unspecified site of left female breast: Secondary | ICD-10-CM

## 2015-01-17 LAB — SURGICAL PATHOLOGY

## 2015-01-17 NOTE — Progress Notes (Signed)
Patient came in today for a wound check.  The wound is clean, with no signs of infection noted. Derma bond intact. Drainage has been over 30 ml a day. Continue drain care. Follow up as scheduled.

## 2015-01-17 NOTE — Patient Instructions (Signed)
The patient is aware to call back for any questions or concerns.  

## 2015-01-21 ENCOUNTER — Encounter: Payer: Self-pay | Admitting: General Surgery

## 2015-01-21 ENCOUNTER — Ambulatory Visit (INDEPENDENT_AMBULATORY_CARE_PROVIDER_SITE_OTHER): Payer: Medicaid Other | Admitting: General Surgery

## 2015-01-21 VITALS — BP 116/66 | HR 68 | Resp 14 | Ht 64.0 in | Wt 121.0 lb

## 2015-01-21 DIAGNOSIS — C50919 Malignant neoplasm of unspecified site of unspecified female breast: Secondary | ICD-10-CM

## 2015-01-21 DIAGNOSIS — IMO0001 Reserved for inherently not codable concepts without codable children: Secondary | ICD-10-CM

## 2015-01-21 DIAGNOSIS — T814XXA Infection following a procedure, initial encounter: Principal | ICD-10-CM

## 2015-01-21 MED ORDER — DOXYCYCLINE HYCLATE 100 MG PO CAPS: 100.0000 mg | ORAL_CAPSULE | Freq: Two times a day (BID) | ORAL | Status: DC

## 2015-01-21 NOTE — Patient Instructions (Signed)
Patient tom return one week

## 2015-01-21 NOTE — Progress Notes (Signed)
This is a 64 year old female here today for her post op left Mastectomy done on 01/14/15. Patient states she is doing well. Drain sheet present.   Drainage 20-79ml per day but it is a bit cloudy. Mild redness around drain site. C/S sent.  Also has a loculted fluid collection superior to incision.  Rx with Doxycycline, pending culture report. Reassess in 1 week. Path showed a 2.4cm Invasive lobular CA. T2,N0.  Possibility of chemo. Will make referral to oncology.

## 2015-01-23 ENCOUNTER — Encounter: Payer: Self-pay | Admitting: General Surgery

## 2015-01-23 ENCOUNTER — Encounter: Payer: Self-pay | Admitting: *Deleted

## 2015-01-23 NOTE — Progress Notes (Signed)
MammaPrint ordered (forms faxed) per Dr Jamal Collin.

## 2015-01-24 ENCOUNTER — Telehealth: Payer: Self-pay | Admitting: *Deleted

## 2015-01-24 MED ORDER — LEVOFLOXACIN 500 MG PO TABS: 500.0000 mg | ORAL_TABLET | Freq: Every day | ORAL | Status: AC

## 2015-01-24 NOTE — Telephone Encounter (Signed)
Notified patient caregiver as instructed, patient caregiver agrees. Discussed follow-up appointments.

## 2015-01-24 NOTE — Telephone Encounter (Signed)
-----   Message from Christene Lye, MD sent at 01/24/2015  9:34 AM EDT ----- Needs to stop doxycycline. Rx with Levaquin 500 mg daily

## 2015-01-25 LAB — ANAEROBIC AND AEROBIC CULTURE

## 2015-01-31 ENCOUNTER — Ambulatory Visit (INDEPENDENT_AMBULATORY_CARE_PROVIDER_SITE_OTHER): Payer: Medicaid Other | Admitting: General Surgery

## 2015-01-31 ENCOUNTER — Encounter: Payer: Self-pay | Admitting: General Surgery

## 2015-01-31 VITALS — BP 110/62 | HR 84 | Resp 14 | Ht 64.0 in | Wt 122.0 lb

## 2015-01-31 DIAGNOSIS — C50912 Malignant neoplasm of unspecified site of left female breast: Secondary | ICD-10-CM

## 2015-01-31 NOTE — Patient Instructions (Addendum)
The patient is aware to call back for any questions or concerns. Return to return in one month.  Patient to see Dr. Oliva Bustard at the [facility] on 02-12-15 at 8 am.

## 2015-01-31 NOTE — Progress Notes (Signed)
Here today for her postoperative visit, left mastectomy done 01-14-15. Drain sheet present. Drain removed . Patient to return in one month She had a T2,N0 invasive lobular CA.  Patient to see Dr. Oliva Bustard at the [facility] for consideration of chemo or mammoprint.

## 2015-02-12 ENCOUNTER — Inpatient Hospital Stay: Payer: Medicaid Other | Attending: Oncology | Admitting: Oncology

## 2015-02-12 ENCOUNTER — Encounter: Payer: Self-pay | Admitting: *Deleted

## 2015-02-12 ENCOUNTER — Encounter: Payer: Self-pay | Admitting: Oncology

## 2015-02-12 VITALS — BP 115/84 | HR 103 | Temp 95.7°F | Wt 121.3 lb

## 2015-02-12 DIAGNOSIS — E041 Nontoxic single thyroid nodule: Secondary | ICD-10-CM | POA: Insufficient documentation

## 2015-02-12 DIAGNOSIS — E559 Vitamin D deficiency, unspecified: Secondary | ICD-10-CM | POA: Insufficient documentation

## 2015-02-12 DIAGNOSIS — J439 Emphysema, unspecified: Secondary | ICD-10-CM | POA: Diagnosis not present

## 2015-02-12 DIAGNOSIS — Z170 Estrogen receptor positive status [ER+]: Secondary | ICD-10-CM

## 2015-02-12 DIAGNOSIS — F319 Bipolar disorder, unspecified: Secondary | ICD-10-CM | POA: Insufficient documentation

## 2015-02-12 DIAGNOSIS — I517 Cardiomegaly: Secondary | ICD-10-CM | POA: Insufficient documentation

## 2015-02-12 DIAGNOSIS — Z915 Personal history of self-harm: Secondary | ICD-10-CM | POA: Diagnosis not present

## 2015-02-12 DIAGNOSIS — F329 Major depressive disorder, single episode, unspecified: Secondary | ICD-10-CM | POA: Diagnosis not present

## 2015-02-12 DIAGNOSIS — E785 Hyperlipidemia, unspecified: Secondary | ICD-10-CM | POA: Insufficient documentation

## 2015-02-12 DIAGNOSIS — C50919 Malignant neoplasm of unspecified site of unspecified female breast: Secondary | ICD-10-CM

## 2015-02-12 DIAGNOSIS — M818 Other osteoporosis without current pathological fracture: Secondary | ICD-10-CM

## 2015-02-12 DIAGNOSIS — Z8049 Family history of malignant neoplasm of other genital organs: Secondary | ICD-10-CM | POA: Diagnosis not present

## 2015-02-12 DIAGNOSIS — F209 Schizophrenia, unspecified: Secondary | ICD-10-CM | POA: Diagnosis not present

## 2015-02-12 DIAGNOSIS — Z79899 Other long term (current) drug therapy: Secondary | ICD-10-CM | POA: Diagnosis not present

## 2015-02-12 DIAGNOSIS — Z9012 Acquired absence of left breast and nipple: Secondary | ICD-10-CM | POA: Insufficient documentation

## 2015-02-12 DIAGNOSIS — F17210 Nicotine dependence, cigarettes, uncomplicated: Secondary | ICD-10-CM | POA: Diagnosis not present

## 2015-02-12 DIAGNOSIS — C50912 Malignant neoplasm of unspecified site of left female breast: Secondary | ICD-10-CM | POA: Insufficient documentation

## 2015-02-12 MED ORDER — LETROZOLE 2.5 MG PO TABS: 2.5000 mg | ORAL_TABLET | Freq: Every day | ORAL | Status: DC

## 2015-02-12 NOTE — Progress Notes (Signed)
Selena Pittman @ [facility] Telephone:(336) [phone]  Fax:(336) [place]  Selena Pittman OB: 02/20/1951  MR#: [id number]  WVP#:[id number]  Patient Care Team: Selena Market, MD as PCP - General (Family Medicine) Seeplaputhur Selena Haines, MD (General Surgery) Selena Pittman, [place] (Family Medicine)  CHIEF COMPLAINT: No chief complaint on file.  carcinoma of left breast status post modified radical mastectomy (October, 2016) T2 N0 M0 tumor estrogen receptor positive progesterone receptor positive. Multigated analysis with Mammo print has been reported to be low risk Invasive lobular carcinoma Estrogen receptor positive.  Progesterone receptor positive.  HER-2 receptor by fish negative VISIT DIAGNOSIS:  Carcinoma of left breast    No history exists.    Oncology Flowsheet 12/31/2014 01/14/2015  ondansetron (ZOFRAN) IV - -    INTERVAL HISTORY: 64 year old lady with a history of schizophrenia Patient is in family care for last 10 years.  Patient had a one episode of suicidal attempt.  Because of that she has developed disability of right upper extremity. Patient is a chronic smoker.  Had an abnormal mammogram of the left breast underwent biopsy which was positive for lobular carcinoma attempted lumpectomy had a positive margin patient underwent mastectomy.  Sentinel lymph nodes were negative.  Tumor was 24 mm in size.  Was estrogen progesterone receptor positive and HER-2 receptor negative Patient is here for ongoing evaluation and treatment consideration  According to caregiver patient had been seen by primary care physician with number of tests done in his office Results are not available. REVIEW OF SYSTEMS:   Gen. status: Patient lives in the family home care.  Not any acute distress. Consults all the questions appropriately. GENERAL:  Feels good.  Active.  No fevers, sweats or weight loss. PERFORMANCE STATUS (ECOG):  1 HEENT:  No visual changes, runny nose, sore  throat, mouth sores or tenderness. Lungs: No shortness of breath or cough.  No hemoptysis. Cardiac:  No chest pain, palpitations, orthopnea, or PND. GI:  No nausea, vomiting, diarrhea, constipation, melena or hematochezia. GU:  No urgency, frequency, dysuria, or hematuria.  Previous history of hysterectomy. Musculoskeletal:  No back pain.  No joint pain.  No muscle tenderness. Extremities:  No pain or swelling. Skin:  No rashes or skin changes. Neuro:  No headache, numbness or weakness, balance or coordination issues. Endocrine:  No diabetes, thyroid issues, hot flashes or night sweats. Psych:  No mood changes, depression or anxiety. Pain:  No focal pain. Review of systems:  All other systems reviewed and found to be negative.  As per HPI. Otherwise, a complete review of systems is negatve.  PAST MEDICAL HISTORY: Past Medical History  Diagnosis Date  . Vitamin D deficiency   . Schizophrenia ([place])   . Bipolar affective ([place])   . Schizoaffective disorder ([place])   . Osteoporosis   . Cardiomegaly   . Emphysema of lung ([place])   . Depression   . Hyperlipidemia   . Thyroid nodule   . Endometriosis   . Cancer ([place]) 12-11-14    INVASIVE MAMMARY CARCINOMA/.left breast    PAST SURGICAL HISTORY: Past Surgical History  Procedure Laterality Date  . Diagnostic mammogram  12/04/2014    Done at [facility] Imaging Category 5-Left Breast  . Tendon repair Right     hand  . Dilation and curettage of uterus    . Tubal ligation    . Tonsillectomy    . Abdominal hysterectomy    . Breast biopsy Left 12-11-14    INVASIVE MAMMARY CARCINOMA.  Selena Pittman  Breast lumpectomy with sentinel lymph node biopsy Left 12/31/2014    Procedure: LEFT BREAST LUMPECTOMY WITH ULTRASOUND GUIDED NEEDLE LOCALIZATION, SENTINEL LYMPH NODE BIOPSY ;  Surgeon: Selena Lye, MD;  Location: [facility] ORS;  Service: General;  Laterality: Left;  . Simple mastectomy with axillary sentinel node biopsy Left 01/14/2015    Procedure: SIMPLE  MASTECTOMY;  Surgeon: Selena Lye, MD;  Location: [facility] ORS;  Service: General;  Laterality: Left;    FAMILY HISTORY Family History  Problem Relation Age of Onset  . Cancer Mother     uterine  . Heart attack Father     GYNECOLOGIC HISTORY:  No LMP recorded. Patient has had a hysterectomy.     ADVANCED DIRECTIVES:    HEALTH MAINTENANCE: Social History  Substance Use Topics  . Smoking status: Current Every Day Smoker -- 0.20 packs/day for 45 years    Types: Cigarettes  . Smokeless tobacco: Never Used  . Alcohol Use: No     No Known Allergies  Current Outpatient Prescriptions  Medication Sig Dispense Refill  . alendronate (FOSAMAX) 70 MG tablet Take 70 mg by mouth once a week. Take with a full glass of water on an empty stomach.    . cetirizine (ZYRTEC) 10 MG tablet Take 10 mg by mouth daily.    . cholecalciferol (VITAMIN D) 1000 UNITS tablet Take 2,000 Units by mouth daily.    . cloZAPine (CLOZARIL) 100 MG tablet Take 400 mg by mouth daily.    Selena Pittman doxycycline (VIBRAMYCIN) 100 MG capsule Take 1 capsule (100 mg total) by mouth 2 (two) times daily. 20 capsule 0  . hydrOXYzine (ATARAX/VISTARIL) 25 MG tablet Take 25 mg by mouth 2 (two) times daily.    Selena Pittman ketoconazole (NIZORAL) 2 % cream Apply 1 application topically daily as needed for irritation.    Selena Pittman LORazepam (ATIVAN) 0.5 MG tablet Take 0.5 mg by mouth at bedtime.    Selena Pittman omeprazole (PRILOSEC) 20 MG capsule Take 20 mg by mouth daily.    Selena Pittman oxyCODONE-acetaminophen (ROXICET) 5-325 MG per tablet Take 1 tablet by mouth every 4 (four) hours as needed. 30 tablet 0  . potassium chloride (KLOR-CON) 20 MEQ packet Take 20 mEq by mouth 2 (two) times daily. 20 tablet 0  . simvastatin (ZOCOR) 20 MG tablet Take 20 mg by mouth daily.    . traMADol (ULTRAM) 50 MG tablet Take 1 tablet (50 mg total) by mouth every 6 (six) hours as needed. 30 tablet 0   No current facility-administered medications for this visit.    OBJECTIVE: PHYSICAL  EXAM: GENERAL:  Well developed, well nourished, sitting comfortably in the exam room in no acute distress. MENTAL STATUS:  Alert and oriented to person, place and time.  ENT:  Oropharynx clear without lesion.  Tongue normal. Mucous membranes moist.  RESPIRATORY:  Clear to auscultation without rales, wheezes or rhonchi. CARDIOVASCULAR:  Regular rate and rhythm without murmur, rub or gallop. BREAST:  Right breast without masses, skin changes or nipple discharge.  Left breast mastectomy wound is healing well ABDOMEN:  Soft, non-tender, with active bowel sounds, and no hepatosplenomegaly.  No masses. BACK:  No CVA tenderness.  No tenderness on percussion of the back or rib cage. SKIN:  No rashes, ulcers or lesions. EXTREMITIES: No edema, no skin discoloration or tenderness.  No palpable cords. LYMPH NODES: No palpable cervical, supraclavicular, axillary or inguinal adenopathy  NEUROLOGICAL: Unremarkable. PSYCH:  Appropriate.  Vital signs have been reviewed    ECOG FS:1 - Symptomatic  but completely ambulatory  LAB RESULTS:  All lab data and pathology has been reviewed   STUDIES: Mammogram reveals 1.9 x 1.4 cm asymmetry in the left breast (September, 2016) Diagnostic mammogram reveals a 9 mm largest asymmetry.  ASSESSMENT:   Carcinoma of left breast status post mastectomies 2 negative lymph node. Tumor is 2.4 cm in size is estrogen progesterone receptor positive HER-2 receptor negative  Mammo print has been reported to be low risk  PLAN: Considering patient's multiple previous illnesses including comorbid condition like schizophrenia bipolar disorder That would be prudent for patient to go on letrozole therapy I do not see any need for chemotherapy or radiation therapy at present time Patient is already on vitamin D.  Bone density study probably has been done by primary care physician reserved for preop pain if it is not done bone density study would be done.  We discussed side effect  of letrozole therapy Reevaluate patient in 4 months I discussed her eligibility for our available clinical trial.  Patient is not eligible for trial because of comorbid condition All previous mammogram is been reviewed independently. She chronic smoker does not want to quit smoking has been counseled She may qualify for over lung cancer screening program Patient expressed understanding and was in agreement with this plan. She also understands that She can call clinic at any time with any questions, concerns, or complaints.    No matching staging information was found for the patient.  Forest Gleason, MD   02/12/2015 8:17 AM

## 2015-02-12 NOTE — Progress Notes (Signed)
Patient here today as new evaluation referred by Dr. Jamal Collin for breast ca.

## 2015-02-12 NOTE — Progress Notes (Signed)
  Oncology Nurse Navigator Documentation  Referral date to RadOnc/MedOnc: 02/12/15 (02/12/15 0900) Navigator Encounter Type: Initial MedOnc (02/12/15 0900) Patient Visit Type: Medonc (02/12/15 0900) Treatment Phase: Other ([place]) (02/12/15 0900) Barriers/Navigation Needs: Education (02/12/15 0900)    Patient lives in a family care home.  Her care provider is present today during the treatment plan discussion and exam.  Patient refused educational literature, but did accept information regarding survivorship.  They are to call if they have any questions or needs.  Prescription for a breast prosthesis and mastectomy bras given to the patient per her request.       Education Method: Written (02/12/15 0900)      Time Spent with Patient: 60 (02/12/15 0900)

## 2015-02-20 ENCOUNTER — Encounter: Payer: Self-pay | Admitting: General Surgery

## 2015-03-05 ENCOUNTER — Ambulatory Visit (INDEPENDENT_AMBULATORY_CARE_PROVIDER_SITE_OTHER): Payer: Medicaid Other | Admitting: General Surgery

## 2015-03-05 ENCOUNTER — Encounter: Payer: Self-pay | Admitting: General Surgery

## 2015-03-05 VITALS — BP 126/70 | HR 68 | Resp 14 | Ht 64.0 in | Wt 121.0 lb

## 2015-03-05 DIAGNOSIS — C50912 Malignant neoplasm of unspecified site of left female breast: Secondary | ICD-10-CM

## 2015-03-05 NOTE — Patient Instructions (Signed)
Patient to return on three months.  

## 2015-03-05 NOTE — Progress Notes (Signed)
Here today for her postoperative visit, left mastectomy done 01-14-15.  Patient states she is doing well.    Left mastectomy site is clean and well healed.  Currently on Letrozole. Mammoprint showed low score  Patient to return three months.

## 2015-03-20 ENCOUNTER — Encounter: Payer: Self-pay | Admitting: General Surgery

## 2015-06-05 ENCOUNTER — Encounter: Payer: Self-pay | Admitting: General Surgery

## 2015-06-05 ENCOUNTER — Ambulatory Visit (INDEPENDENT_AMBULATORY_CARE_PROVIDER_SITE_OTHER): Payer: Medicaid Other | Admitting: General Surgery

## 2015-06-05 VITALS — BP 120/74 | HR 72 | Resp 14 | Ht 66.0 in | Wt 121.0 lb

## 2015-06-05 DIAGNOSIS — C50912 Malignant neoplasm of unspecified site of left female breast: Secondary | ICD-10-CM | POA: Diagnosis not present

## 2015-06-05 NOTE — Patient Instructions (Signed)
Patient to return in six Continue self breast exams. Call office for any new breast issues or concerns. month right breast diagnotic mammogram.

## 2015-06-05 NOTE — Progress Notes (Signed)
Patient ID: Selena Pittman, female   DOB: May 27, 1950, 65 y.o.   MRN: [id number]  Chief Complaint  Patient presents with  . Follow-up    left mastectomy    HPI Selena Pittman is a 65 y.o. female here today for her follow up left mastectomy done 01-14-15.Patient states she is doing well. Currently on Letrozole I have reviewed the history of present illness with the patient. HPI  Past Medical History  Diagnosis Date  . Vitamin D deficiency   . Schizophrenia ([place])   . Bipolar affective ([place])   . Schizoaffective disorder ([place])   . Osteoporosis   . Cardiomegaly   . Emphysema of lung ([place])   . Depression   . Hyperlipidemia   . Thyroid nodule   . Endometriosis   . Cancer ([place]) 12-11-14    INVASIVE MAMMARY CARCINOMA/.left breast    Past Surgical History  Procedure Laterality Date  . Diagnostic mammogram  12/04/2014    Done at [facility] Imaging Category 5-Left Breast  . Tendon repair Right     hand  . Dilation and curettage of uterus    . Tubal ligation    . Tonsillectomy    . Abdominal hysterectomy    . Breast biopsy Left 12-11-14    INVASIVE MAMMARY CARCINOMA.  . Breast lumpectomy with sentinel lymph node biopsy Left 12/31/2014    Procedure: LEFT BREAST LUMPECTOMY WITH ULTRASOUND GUIDED NEEDLE LOCALIZATION, SENTINEL LYMPH NODE BIOPSY ;  Surgeon: Christene Lye, MD;  Location: [facility] ORS;  Service: General;  Laterality: Left;  . Simple mastectomy with axillary sentinel node biopsy Left 01/14/2015    Procedure: SIMPLE MASTECTOMY;  Surgeon: Christene Lye, MD;  Location: [facility] ORS;  Service: General;  Laterality: Left;    Family History  Problem Relation Age of Onset  . Cancer Mother     uterine  . Heart attack Father     Social History Social History  Substance Use Topics  . Smoking status: Current Every Day Smoker -- 0.20 packs/day for 45 years    Types: Cigarettes  . Smokeless tobacco: Never Used  . Alcohol Use: No    No Known Allergies  Current  Outpatient Prescriptions  Medication Sig Dispense Refill  . cholecalciferol (VITAMIN D) 1000 UNITS tablet Take 2,000 Units by mouth daily.    . cloZAPine (CLOZARIL) 100 MG tablet Take 400 mg by mouth daily.    Marland Kitchen letrozole (FEMARA) 2.5 MG tablet Take 1 tablet (2.5 mg total) by mouth daily. 30 tablet 6  . Linaclotide (LINZESS) 145 MCG CAPS capsule Take 145 mcg by mouth daily.    . mometasone (ELOCON) 0.1 % cream Apply 1 application topically daily.    Marland Kitchen omeprazole (PRILOSEC) 20 MG capsule Take 20 mg by mouth daily.    . simvastatin (ZOCOR) 20 MG tablet Take 20 mg by mouth daily.     No current facility-administered medications for this visit.    Review of Systems Review of Systems  Constitutional: Negative.   Respiratory: Negative.   Cardiovascular: Negative.     Blood pressure 120/74, pulse 72, resp. rate 14, height 5\' 6"  (1.676 m), weight 121 lb (54.885 kg).  Physical Exam Physical Exam  Constitutional: She is oriented to person, place, and time. She appears well-developed and well-nourished.  Eyes: Conjunctivae are normal. No scleral icterus.  Neck: Neck supple.  Cardiovascular: Normal rate, regular rhythm and normal heart sounds.   Pulmonary/Chest: Effort normal and breath sounds normal. Right breast exhibits no inverted nipple, no mass, no  nipple discharge, no skin change and no tenderness.  Left mastectomy well healed no sign of recurrent disease.  Abdominal: Soft. Bowel sounds are normal. There is no tenderness.  Lymphadenopathy:    She has no cervical adenopathy.    She has no axillary adenopathy.  Neurological: She is alert and oriented to person, place, and time.  Skin: Skin is warm and dry.    Data Reviewed Notes reviewed  Assessment    Stable exam, six month post left total mastectomy- T2,N0 invasive lobular CA. Continued on Letrazole    Plan    Patient to return in six month right breast diagnotic mammogram.     PCP:  Brunetta Genera This information has been  scribed by Gaspar Cola CMA.    Tryone Kille G 06/05/2015, 12:26 PM

## 2015-06-17 ENCOUNTER — Inpatient Hospital Stay: Payer: Medicaid Other

## 2015-06-17 ENCOUNTER — Inpatient Hospital Stay: Payer: Medicaid Other | Admitting: Oncology

## 2015-06-21 ENCOUNTER — Inpatient Hospital Stay: Payer: Medicaid Other | Admitting: Oncology

## 2015-06-21 ENCOUNTER — Inpatient Hospital Stay: Payer: Medicaid Other

## 2015-07-01 ENCOUNTER — Inpatient Hospital Stay: Payer: Medicaid Other | Admitting: Oncology

## 2015-07-01 ENCOUNTER — Inpatient Hospital Stay: Payer: Medicaid Other

## 2015-08-06 ENCOUNTER — Encounter: Payer: Self-pay | Admitting: Family Medicine

## 2015-08-06 ENCOUNTER — Inpatient Hospital Stay: Payer: Medicaid Other | Attending: Oncology

## 2015-08-06 ENCOUNTER — Inpatient Hospital Stay (HOSPITAL_BASED_OUTPATIENT_CLINIC_OR_DEPARTMENT_OTHER): Payer: Medicaid Other | Admitting: Family Medicine

## 2015-08-06 VITALS — BP 126/82 | HR 108 | Temp 98.2°F | Wt 119.9 lb

## 2015-08-06 DIAGNOSIS — Z170 Estrogen receptor positive status [ER+]: Secondary | ICD-10-CM | POA: Insufficient documentation

## 2015-08-06 DIAGNOSIS — F209 Schizophrenia, unspecified: Secondary | ICD-10-CM | POA: Diagnosis not present

## 2015-08-06 DIAGNOSIS — Z79899 Other long term (current) drug therapy: Secondary | ICD-10-CM | POA: Insufficient documentation

## 2015-08-06 DIAGNOSIS — I517 Cardiomegaly: Secondary | ICD-10-CM | POA: Insufficient documentation

## 2015-08-06 DIAGNOSIS — E559 Vitamin D deficiency, unspecified: Secondary | ICD-10-CM

## 2015-08-06 DIAGNOSIS — Z9012 Acquired absence of left breast and nipple: Secondary | ICD-10-CM | POA: Insufficient documentation

## 2015-08-06 DIAGNOSIS — F319 Bipolar disorder, unspecified: Secondary | ICD-10-CM | POA: Insufficient documentation

## 2015-08-06 DIAGNOSIS — E785 Hyperlipidemia, unspecified: Secondary | ICD-10-CM | POA: Insufficient documentation

## 2015-08-06 DIAGNOSIS — Z8049 Family history of malignant neoplasm of other genital organs: Secondary | ICD-10-CM | POA: Diagnosis not present

## 2015-08-06 DIAGNOSIS — F418 Other specified anxiety disorders: Secondary | ICD-10-CM | POA: Diagnosis not present

## 2015-08-06 DIAGNOSIS — C50919 Malignant neoplasm of unspecified site of unspecified female breast: Secondary | ICD-10-CM

## 2015-08-06 DIAGNOSIS — E041 Nontoxic single thyroid nodule: Secondary | ICD-10-CM | POA: Insufficient documentation

## 2015-08-06 DIAGNOSIS — M818 Other osteoporosis without current pathological fracture: Secondary | ICD-10-CM

## 2015-08-06 DIAGNOSIS — F17210 Nicotine dependence, cigarettes, uncomplicated: Secondary | ICD-10-CM

## 2015-08-06 DIAGNOSIS — Z79811 Long term (current) use of aromatase inhibitors: Secondary | ICD-10-CM

## 2015-08-06 DIAGNOSIS — C50912 Malignant neoplasm of unspecified site of left female breast: Secondary | ICD-10-CM | POA: Insufficient documentation

## 2015-08-06 LAB — CBC WITH DIFFERENTIAL/PLATELET
BASOS PCT: 0 %
Basophils Absolute: 0 10*3/uL (ref 0–0.1)
EOS ABS: 0.2 10*3/uL (ref 0–0.7)
Eosinophils Relative: 4 %
HCT: 42.6 % (ref 35.0–47.0)
Hemoglobin: 14.6 g/dL (ref 12.0–16.0)
Lymphocytes Relative: 21 %
Lymphs Abs: 0.9 10*3/uL — ABNORMAL LOW (ref 1.0–3.6)
MCH: 31.1 pg (ref 26.0–34.0)
MCHC: 34.3 g/dL (ref 32.0–36.0)
MCV: 90.6 fL (ref 80.0–100.0)
MONO ABS: 0.3 10*3/uL (ref 0.2–0.9)
MONOS PCT: 6 %
Neutro Abs: 2.9 10*3/uL (ref 1.4–6.5)
Neutrophils Relative %: 69 %
Platelets: 182 10*3/uL (ref 150–440)
RBC: 4.7 MIL/uL (ref 3.80–5.20)
RDW: 12.9 % (ref 11.5–14.5)
WBC: 4.2 10*3/uL (ref 3.6–11.0)

## 2015-08-06 LAB — COMPREHENSIVE METABOLIC PANEL
ALBUMIN: 4.3 g/dL (ref 3.5–5.0)
ALT: 18 U/L (ref 14–54)
ANION GAP: 6 (ref 5–15)
AST: 29 U/L (ref 15–41)
Alkaline Phosphatase: 110 U/L (ref 38–126)
BILIRUBIN TOTAL: 0.8 mg/dL (ref 0.3–1.2)
BUN: 9 mg/dL (ref 6–20)
CO2: 28 mmol/L (ref 22–32)
Calcium: 9.3 mg/dL (ref 8.9–10.3)
Chloride: 106 mmol/L (ref 101–111)
Creatinine, Ser: 0.86 mg/dL (ref 0.44–1.00)
GFR calc non Af Amer: >60 mL/min (ref >60)
GLUCOSE: 144 mg/dL — AB (ref 65–99)
POTASSIUM: 3.6 mmol/L (ref 3.5–5.1)
SODIUM: 140 mmol/L (ref 135–145)
TOTAL PROTEIN: 6.9 g/dL (ref 6.5–8.1)

## 2015-08-06 NOTE — Progress Notes (Signed)
[facility]  Telephone:(336) [phone]  Fax:(336) [phone]     Selena Pittman DOB: November 08, 1950  MR#: [id number]  TAV#:[id number]  Patient Care Team: Lorelee Market, MD as PCP - General (Family Medicine) Seeplaputhur Robinette Haines, MD (General Surgery) Vista Mink, FNP (Family Medicine)  CHIEF COMPLAINT:  Chief Complaint  Patient presents with  . Malignant neoplasm of female breast, unspecified laterality,    INTERVAL HISTORY:  Patient is here for further follow-up and treatment consideration regarding carcinoma of left breast. Patient was originally diagnosed in September 2016 with a ER/PR positive, HER-2/neu negative invasive lobular carcinoma. She underwent modified radical mastectomy in September 2016. She is currently on letrozole as well as calcium and vitamin D supplements. Patient doesn't have in a family care home and caregiver is at her side. She reports that patient has been in her normal state of health as patient's only a fair historian. Her most recent mammogram was August 2016 and reported as negative.  REVIEW OF SYSTEMS:   Review of Systems  Constitutional: Negative for fever, chills, weight loss, malaise/fatigue and diaphoresis.  HENT: Negative.   Eyes: Negative.   Respiratory: Negative for cough, hemoptysis, sputum production, shortness of breath and wheezing.   Cardiovascular: Negative for chest pain, palpitations, orthopnea, claudication, leg swelling and PND.  Gastrointestinal: Negative for heartburn, nausea, vomiting, abdominal pain, diarrhea, constipation, blood in stool and melena.  Genitourinary: Negative.   Musculoskeletal: Negative.   Skin: Negative.   Neurological: Negative for dizziness, tingling, focal weakness, seizures and weakness.  Endo/Heme/Allergies: Does not bruise/bleed easily.  Psychiatric/Behavioral: Positive for depression. The patient is nervous/anxious. The patient does not have insomnia.     As per HPI.  Otherwise, a complete review of systems is negatve.  ONCOLOGY HISTORY: Oncology History   Carcinoma of left breast status post modified radical mastectomy (October, 2016) T2 N0 M0 tumor estrogen receptor positive progesterone receptor positive. Multigated analysis with Mammo print has been reported to be low risk Invasive lobular carcinoma Estrogen receptor positive. Progesterone receptor positive. HER-2 receptor by fish negative     Malignant neoplasm of left female breast ([place])   06/05/2015 Initial Diagnosis Malignant neoplasm of left female breast [facility])    PAST MEDICAL HISTORY: Past Medical History  Diagnosis Date  . Vitamin D deficiency   . Schizophrenia ([place])   . Bipolar affective ([place])   . Schizoaffective disorder ([place])   . Osteoporosis   . Cardiomegaly   . Emphysema of lung ([place])   . Depression   . Hyperlipidemia   . Thyroid nodule   . Endometriosis   . Cancer ([place]) 12-11-14    INVASIVE MAMMARY CARCINOMA/.left breast    PAST SURGICAL HISTORY: Past Surgical History  Procedure Laterality Date  . Diagnostic mammogram  12/04/2014    Done at [facility] Imaging Category 5-Left Breast  . Tendon repair Right     hand  . Dilation and curettage of uterus    . Tubal ligation    . Tonsillectomy    . Abdominal hysterectomy    . Breast biopsy Left 12-11-14    INVASIVE MAMMARY CARCINOMA.  . Breast lumpectomy with sentinel lymph node biopsy Left 12/31/2014    Procedure: LEFT BREAST LUMPECTOMY WITH ULTRASOUND GUIDED NEEDLE LOCALIZATION, SENTINEL LYMPH NODE BIOPSY ;  Surgeon: Christene Lye, MD;  Location: [facility] ORS;  Service: General;  Laterality: Left;  . Simple mastectomy with axillary sentinel node biopsy Left 01/14/2015    Procedure: SIMPLE MASTECTOMY;  Surgeon: Christene Lye, MD;  Location:  [facility] ORS;  Service: General;  Laterality: Left;    FAMILY HISTORY Family History  Problem Relation Age of Onset  . Cancer Mother     uterine  . Heart attack Father      GYNECOLOGIC HISTORY:  No LMP recorded. Patient has had a hysterectomy.     ADVANCED DIRECTIVES:    HEALTH MAINTENANCE: Social History  Substance Use Topics  . Smoking status: Current Every Day Smoker -- 0.20 packs/day for 45 years    Types: Cigarettes  . Smokeless tobacco: Never Used  . Alcohol Use: No     Mammogram:11/2014  No Known Allergies  Current Outpatient Prescriptions  Medication Sig Dispense Refill  . alendronate (FOSAMAX) 70 MG tablet Take 70 mg by mouth once a week. Take with a full glass of water on an empty stomach.    . cetirizine (ZYRTEC) 10 MG tablet Take 10 mg by mouth daily.    . cholecalciferol (VITAMIN D) 1000 UNITS tablet Take 2,000 Units by mouth daily.    . cloZAPine (CLOZARIL) 100 MG tablet Take 400 mg by mouth daily.    . hydrOXYzine (ATARAX/VISTARIL) 25 MG tablet Take 25 mg by mouth 3 (three) times daily as needed.    Marland Kitchen letrozole (FEMARA) 2.5 MG tablet Take 1 tablet (2.5 mg total) by mouth daily. 30 tablet 6  . Linaclotide (LINZESS) 145 MCG CAPS capsule Take 145 mcg by mouth daily.    Marland Kitchen omeprazole (PRILOSEC) 20 MG capsule Take 20 mg by mouth daily.    . simvastatin (ZOCOR) 20 MG tablet Take 20 mg by mouth daily.     No current facility-administered medications for this visit.    OBJECTIVE: BP 126/82 mmHg  Pulse 108  Temp(Src) 98.2 F (36.8 C) (Oral)  Wt 119 lb 14.9 oz (54.4 kg)  SpO2 99%   Body mass index is 19.37 kg/(m^2).    ECOG FS:0 - Asymptomatic  General: Well-developed, well-nourished, no acute distress. Eyes: Pink conjunctiva, anicteric sclera. HEENT: Normocephalic, poor dental hygiene.  Lungs: Clear to auscultation bilaterally. Heart: Regular rate and rhythm. No rubs, murmurs, or gallops. Abdomen: Soft, nontender, nondistended. No organomegaly noted, normoactive bowel sounds. Breast: Left chest wall free of masses. Right breast palpated in a circular manner in the sitting and supine positions.  No masses or fullness palpated.   Axilla palpated in both positions with no masses or fullness palpated.  Musculoskeletal: No edema, cyanosis, or clubbing. Neuro: Alert, answering all questions appropriately. Cranial nerves grossly intact. Skin: Dry flaky skin. Psych: Normal affect. Lymphatics: No cervical, clavicular, or axillary LAD.   LAB RESULTS:  Appointment on 08/06/2015  Component Date Value Ref Range Status  . WBC 08/06/2015 4.2  3.6 - 11.0 K/uL Final  . RBC 08/06/2015 4.70  3.80 - 5.20 MIL/uL Final  . Hemoglobin 08/06/2015 14.6  12.0 - 16.0 g/dL Final  . HCT 08/06/2015 42.6  35.0 - 47.0 % Final  . MCV 08/06/2015 90.6  80.0 - 100.0 fL Final  . MCH 08/06/2015 31.1  26.0 - 34.0 pg Final  . MCHC 08/06/2015 34.3  32.0 - 36.0 g/dL Final  . RDW 08/06/2015 12.9  11.5 - 14.5 % Final  . Platelets 08/06/2015 182  150 - 440 K/uL Final  . Neutrophils Relative % 08/06/2015 69   Final  . Neutro Abs 08/06/2015 2.9  1.4 - 6.5 K/uL Final  . Lymphocytes Relative 08/06/2015 21   Final  . Lymphs Abs 08/06/2015 0.9* 1.0 - 3.6 K/uL Final  . Monocytes Relative  08/06/2015 6   Final  . Monocytes Absolute 08/06/2015 0.3  0.2 - 0.9 K/uL Final  . Eosinophils Relative 08/06/2015 4   Final  . Eosinophils Absolute 08/06/2015 0.2  0 - 0.7 K/uL Final  . Basophils Relative 08/06/2015 0   Final  . Basophils Absolute 08/06/2015 0.0  0 - 0.1 K/uL Final  . Sodium 08/06/2015 140  135 - 145 mmol/L Final  . Potassium 08/06/2015 3.6  3.5 - 5.1 mmol/L Final  . Chloride 08/06/2015 106  101 - 111 mmol/L Final  . CO2 08/06/2015 28  22 - 32 mmol/L Final  . Glucose, Bld 08/06/2015 144* 65 - 99 mg/dL Final  . BUN 08/06/2015 9  6 - 20 mg/dL Final  . Creatinine, Ser 08/06/2015 0.86  0.44 - 1.00 mg/dL Final  . Calcium 08/06/2015 9.3  8.9 - 10.3 mg/dL Final  . Total Protein 08/06/2015 6.9  6.5 - 8.1 g/dL Final  . Albumin 08/06/2015 4.3  3.5 - 5.0 g/dL Final  . AST 08/06/2015 29  15 - 41 U/L Final  . ALT 08/06/2015 18  14 - 54 U/L Final  . Alkaline  Phosphatase 08/06/2015 110  38 - 126 U/L Final  . Total Bilirubin 08/06/2015 0.8  0.3 - 1.2 mg/dL Final  . GFR calc non Af Amer 08/06/2015 >60  >60 mL/min Final  . GFR calc Af Amer 08/06/2015 >60  >60 mL/min Final   Comment: (NOTE) The eGFR has been calculated using the CKD EPI equation. This calculation has not been validated in all clinical situations. eGFR's persistently <60 mL/min signify possible Chronic Kidney Disease.   . Anion gap 08/06/2015 6  5 - 15 Final    STUDIES: No results found.  ASSESSMENT:  Carcinoma of left breast, T2 N0 M0, stage II.  PLAN:   1. Carcinoma of left breast. Patient was originally diagnosed in September 2016 and had a subsequent left modified radical mastectomy. She continues to follow with Dr. Jamal Collin and surgical incision has healed completely. She denies any complaints of pain at the incision site. Patient is currently on letrozole with calcium and vitamin D and tolerating very well. Her mammogram was last performed in August 2016 and reported as negative. Patient does smoke 5 cigarettes per day as she is a resident of the family care home. Possibility of lung cancer screening program was discussed but patient and interested. Patient is a poor per story and and has multiple comorbidities including schizophrenia, bipolar disorder. She will require next mammogram in August 2017 and caregiver reports that Dr. Angie Fava office orders these for her. Patient also continues with close follow-up with primary care provider. Copy of lab report was given to caregiver for records. We will continue with routine follow-up in approximately 6 months.  Patient expressed understanding and was in agreement with this plan. She also understands that She can call clinic at any time with any questions, concerns, or complaints.   Dr. Oliva Bustard was available for consultation and review of plan of care for this patient.  Stage II, T2 N0 M0.  Evlyn Kanner, NP   08/06/2015 9:40  AM

## 2015-08-07 ENCOUNTER — Other Ambulatory Visit: Payer: Self-pay | Admitting: *Deleted

## 2015-08-07 DIAGNOSIS — C50919 Malignant neoplasm of unspecified site of unspecified female breast: Secondary | ICD-10-CM

## 2015-08-07 MED ORDER — LETROZOLE 2.5 MG PO TABS: 2.5000 mg | ORAL_TABLET | Freq: Every day | ORAL | Status: DC

## 2015-11-11 ENCOUNTER — Encounter: Payer: Self-pay | Admitting: General Surgery

## 2015-11-14 ENCOUNTER — Encounter: Payer: Self-pay | Admitting: *Deleted

## 2015-11-21 ENCOUNTER — Ambulatory Visit (INDEPENDENT_AMBULATORY_CARE_PROVIDER_SITE_OTHER): Payer: Medicaid Other | Admitting: General Surgery

## 2015-11-21 ENCOUNTER — Encounter: Payer: Self-pay | Admitting: General Surgery

## 2015-11-21 VITALS — BP 118/78 | HR 72 | Resp 12 | Ht 66.0 in | Wt 115.0 lb

## 2015-11-21 DIAGNOSIS — C50912 Malignant neoplasm of unspecified site of left female breast: Secondary | ICD-10-CM | POA: Diagnosis not present

## 2015-11-21 NOTE — Progress Notes (Signed)
Patient ID: Selena Pittman, female   DOB: December 04, 1950, 65 y.o.   MRN: [id number]  Chief Complaint  Patient presents with  . Follow-up    mammogram    HPI Selena Pittman is a 65 y.o. female who presents for a breast evaluation s/p mammogram for invasive mammary carcinoma (Stage IIA). The most recent mammogram was done on 11/08/15. Patient does perform regular self breast checks and gets regular mammograms done. She takes letrozole as prescribed and is tolerating the medication well. No new medical conditions. She is accompanied by a guardian, who was present through the duration of the visit. I have reviewed the history of present illness with the patient.   HPI  Past Medical History:  Diagnosis Date  . Bipolar affective ([place])   . Cancer ([place]) 12-11-14   INVASIVE MAMMARY CARCINOMA/.left breast/ T2 N0  . Cardiomegaly   . Depression   . Emphysema of lung ([place])   . Endometriosis   . Hyperlipidemia   . Osteoporosis   . Schizoaffective disorder ([place])   . Schizophrenia ([place])   . Thyroid nodule   . Vitamin D deficiency     Past Surgical History:  Procedure Laterality Date  . ABDOMINAL HYSTERECTOMY    . BREAST BIOPSY Left 12-11-14   INVASIVE MAMMARY CARCINOMA.  Marland Kitchen BREAST LUMPECTOMY WITH SENTINEL LYMPH NODE BIOPSY Left 12/31/2014   Procedure: LEFT BREAST LUMPECTOMY WITH ULTRASOUND GUIDED NEEDLE LOCALIZATION, SENTINEL LYMPH NODE BIOPSY ;  Surgeon: Christene Lye, MD;  Location: [facility] ORS;  Service: General;  Laterality: Left;  . DIAGNOSTIC MAMMOGRAM  12/04/2014   Done at [facility] Imaging Category 5-Left Breast  . DILATION AND CURETTAGE OF UTERUS    . SIMPLE MASTECTOMY WITH AXILLARY SENTINEL NODE BIOPSY Left 01/14/2015   Procedure: SIMPLE MASTECTOMY;  Surgeon: Christene Lye, MD;  Location: [facility] ORS;  Service: General;  Laterality: Left;  . TENDON REPAIR Right    hand  . TONSILLECTOMY    . TUBAL LIGATION      Family History  Problem Relation Age of Onset  . Cancer  Mother     uterine  . Heart attack Father     Social History Social History  Substance Use Topics  . Smoking status: Current Every Day Smoker    Packs/day: 0.20    Years: 45.00    Types: Cigarettes  . Smokeless tobacco: Never Used  . Alcohol use No    No Known Allergies  Current Outpatient Prescriptions  Medication Sig Dispense Refill  . alendronate (FOSAMAX) 70 MG tablet Take 70 mg by mouth once a week. Take with a full glass of water on an empty stomach.    . cetirizine (ZYRTEC) 10 MG tablet Take 10 mg by mouth daily.    . cholecalciferol (VITAMIN D) 1000 UNITS tablet Take 2,000 Units by mouth daily.    . cloZAPine (CLOZARIL) 100 MG tablet Take 400 mg by mouth daily.    . hydrOXYzine (ATARAX/VISTARIL) 25 MG tablet Take 25 mg by mouth 3 (three) times daily as needed.    Marland Kitchen letrozole (FEMARA) 2.5 MG tablet Take 1 tablet (2.5 mg total) by mouth daily. 90 tablet 1  . Linaclotide (LINZESS) 145 MCG CAPS capsule Take 145 mcg by mouth daily.    Marland Kitchen omeprazole (PRILOSEC) 20 MG capsule Take 20 mg by mouth daily.    . simvastatin (ZOCOR) 20 MG tablet Take 20 mg by mouth daily.     No current facility-administered medications for this visit.     Review of  Systems Review of Systems  Constitutional: Negative.   Respiratory: Negative.   Cardiovascular: Negative.     Blood pressure 118/78, pulse 72, resp. rate 12, height 5\' 6"  (1.676 m), weight 115 lb (52.2 kg).  Physical Exam Physical Exam  Constitutional: She is oriented to person, place, and time. She appears well-developed and well-nourished.  Eyes: Conjunctivae are normal.  Neck: Neck supple.  Cardiovascular: Normal rate, regular rhythm and normal heart sounds.   Pulmonary/Chest: Effort normal and breath sounds normal. Right breast exhibits no inverted nipple, no mass, no nipple discharge, no skin change and no tenderness.    Abdominal: Soft. Bowel sounds are normal.  Lymphadenopathy:    She has no cervical adenopathy.     She has no axillary adenopathy.  Neurological: She is alert and oriented to person, place, and time.  Skin: Skin is warm and dry.  Psychiatric: She has a normal mood and affect. Her behavior is normal. Thought content normal.    Data Reviewed Previous notes, mammogram reviewed, surgical pathology reviewed.  Assessment    Invasive mammary carcinoma,T2,N0 s/p mastectomy, currently on letrozole.    Plan    Patient to return in 6 months for an office visit.  CEA and CA 27.29 ordered. To be drawn at [place] with orders per PCP     This information has been scribed by Gaspar Cola CMA.   SANKAR,SEEPLAPUTHUR G 11/21/2015, 11:48 AM

## 2015-11-21 NOTE — Patient Instructions (Signed)
The patient is aware to call back for any questions or concerns.  

## 2015-11-26 ENCOUNTER — Telehealth: Payer: Self-pay | Admitting: *Deleted

## 2015-11-26 LAB — CEA: CEA: 5.6 ng/mL — ABNORMAL HIGH (ref 0.0–4.7)

## 2015-11-26 LAB — CANCER ANTIGEN 27.29: CA 27.29: 16.2 U/mL (ref 0.0–38.6)

## 2015-11-26 NOTE — Progress Notes (Signed)
Inform pt labs are normal. F/u as scheduled

## 2015-11-26 NOTE — Telephone Encounter (Signed)
-----   Message from Christene Lye, MD sent at 11/26/2015  8:34 AM EDT ----- Inform pt labs are normal. F/u as scheduled

## 2015-11-26 NOTE — Telephone Encounter (Signed)
Notified patient as instructed, patient pleased. Discussed follow-up appointments, patient agrees  

## 2016-02-04 ENCOUNTER — Inpatient Hospital Stay: Payer: Medicaid Other | Admitting: Hematology and Oncology

## 2016-02-04 ENCOUNTER — Inpatient Hospital Stay: Payer: Medicaid Other

## 2016-02-04 NOTE — Progress Notes (Unsigned)
[facility] day:  02/04/2016  Chief Complaint: Selena Pittman is a 65 y.o. female with stage IIA left breast cancer who is seen for reassessment.  HPI: She presented with an abnormal mammogram.  1.9 x 1.4 cm left breast asymmetry.  Lumpectomy with sentinel lymph node biopsy on 12/31/2014 by Dr. Jamal Collin.  Pathology revealed a 2.4 cm.  Two sentinel lymph nodes were negative.  Positive margin then She underwent mastectomy on 01/14/2015.  carcinoma of left breast status post modified radical mastectomy (October, 2016) T2 N0 M0 tumor estrogen receptor positive progesterone receptor positive. Multigated analysis with Mammo print has been reported to be low risk Invasive lobular carcinoma Estrogen receptor positive.  Progesterone receptor positive.  HER-2 receptor by fish negative  She is on letrozole.  Past Medical History:  Diagnosis Date  . Bipolar affective ([place])   . Cancer ([place]) 12-11-14   INVASIVE MAMMARY CARCINOMA/.left breast/ T2 N0  . Cardiomegaly   . Depression   . Emphysema of lung ([place])   . Endometriosis   . Hyperlipidemia   . Osteoporosis   . Schizoaffective disorder ([place])   . Schizophrenia ([place])   . Thyroid nodule   . Vitamin D deficiency     Past Surgical History:  Procedure Laterality Date  . ABDOMINAL HYSTERECTOMY    . BREAST BIOPSY Left 12-11-14   INVASIVE MAMMARY CARCINOMA.  Marland Kitchen BREAST LUMPECTOMY WITH SENTINEL LYMPH NODE BIOPSY Left 12/31/2014   Procedure: LEFT BREAST LUMPECTOMY WITH ULTRASOUND GUIDED NEEDLE LOCALIZATION, SENTINEL LYMPH NODE BIOPSY ;  Surgeon: Christene Lye, MD;  Location: [facility] ORS;  Service: General;  Laterality: Left;  . DIAGNOSTIC MAMMOGRAM  12/04/2014   Done at [facility] Imaging Category 5-Left Breast  . DILATION AND CURETTAGE OF UTERUS    . SIMPLE MASTECTOMY WITH AXILLARY SENTINEL NODE BIOPSY Left 01/14/2015   Procedure: SIMPLE MASTECTOMY;  Surgeon: Christene Lye, MD;  Location: [facility] ORS;   Service: General;  Laterality: Left;  . TENDON REPAIR Right    hand  . TONSILLECTOMY    . TUBAL LIGATION      Family History  Problem Relation Age of Onset  . Cancer Mother     uterine  . Heart attack Father     Social History:  reports that she has been smoking Cigarettes.  She has a 9.00 pack-year smoking history. She has never used smokeless tobacco. She reports that she does not drink alcohol or use drugs.  The patient has schizophrenia and has been in family care for 10 years. The patient is accompanied by *** alone today.  Allergies: No Known Allergies  Current Medications: Current Outpatient Prescriptions  Medication Sig Dispense Refill  . alendronate (FOSAMAX) 70 MG tablet Take 70 mg by mouth once a week. Take with a full glass of water on an empty stomach.    . cetirizine (ZYRTEC) 10 MG tablet Take 10 mg by mouth daily.    . cholecalciferol (VITAMIN D) 1000 UNITS tablet Take 2,000 Units by mouth daily.    . cloZAPine (CLOZARIL) 100 MG tablet Take 400 mg by mouth daily.    . hydrOXYzine (ATARAX/VISTARIL) 25 MG tablet Take 25 mg by mouth 3 (three) times daily as needed.    Marland Kitchen letrozole (FEMARA) 2.5 MG tablet Take 1 tablet (2.5 mg total) by mouth daily. 90 tablet 1  . Linaclotide (LINZESS) 145 MCG CAPS capsule Take 145 mcg by mouth daily.    Marland Kitchen omeprazole (PRILOSEC) 20 MG capsule Take 20  mg by mouth daily.    . simvastatin (ZOCOR) 20 MG tablet Take 20 mg by mouth daily.     No current facility-administered medications for this visit.     Review of Systems:  GENERAL:  Feels good.  Active.  No fevers, sweats or weight loss. PERFORMANCE STATUS (ECOG):  *** HEENT:  No visual changes, runny nose, sore throat, mouth sores or tenderness. Lungs: No shortness of breath or cough.  No hemoptysis. Cardiac:  No chest pain, palpitations, orthopnea, or PND. GI:  No nausea, vomiting, diarrhea, constipation, melena or hematochezia. GU:  No urgency, frequency, dysuria, or  hematuria. Musculoskeletal:  No back pain.  No joint pain.  No muscle tenderness. Extremities:  No pain or swelling. Skin:  No rashes or skin changes. Neuro:  No headache, numbness or weakness, balance or coordination issues. Endocrine:  No diabetes, thyroid issues, hot flashes or night sweats. Psych:  No mood changes, depression or anxiety. Pain:  No focal pain. Review of systems:  All other systems reviewed and found to be negative.   Physical Exam: There were no vitals taken for this visit. GENERAL:  Well developed, well nourished, sitting comfortably in the exam room in no acute distress. MENTAL STATUS:  Alert and oriented to person, place and time. HEAD:  *** hair.  Normocephalic, atraumatic, face symmetric, no Cushingoid features. EYES:  *** eyes.  Pupils equal round and reactive to light and accomodation.  No conjunctivitis or scleral icterus. ENT:  Oropharynx clear without lesion.  Tongue normal. Mucous membranes moist.  RESPIRATORY:  Clear to auscultation without rales, wheezes or rhonchi. CARDIOVASCULAR:  Regular rate and rhythm without murmur, rub or gallop. ABDOMEN:  Soft, non-tender, with active bowel sounds, and no hepatosplenomegaly.  No masses. SKIN:  No rashes, ulcers or lesions. EXTREMITIES: No edema, no skin discoloration or tenderness.  No palpable cords. LYMPH NODES: No palpable cervical, supraclavicular, axillary or inguinal adenopathy  NEUROLOGICAL: Unremarkable. PSYCH:  Appropriate.  No visits with results within 3 Day(s) from this visit.  Latest known visit with results is:  Office Visit on 11/21/2015  Component Date Value Ref Range Status  . CA 27.29 11/26/2015 16.2  0.0 - 38.6 U/mL Final  . CEA 11/26/2015 5.6* 0.0 - 4.7 ng/mL Final   Comment:        Roche ECLIA methodology       Nonsmokers  <3.9                                      Smokers     <5.6     Assessment:  Selena Pittman is a 65 y.o. female  ***  Plan: 1. *** 2. *** 3. *** 4. *** 5. ***  Lequita Asal, MD  02/04/2016, 5:13 AM

## 2016-02-18 ENCOUNTER — Inpatient Hospital Stay: Payer: Medicaid Other | Attending: Hematology and Oncology

## 2016-02-18 ENCOUNTER — Inpatient Hospital Stay: Payer: Medicaid Other | Admitting: Hematology and Oncology

## 2016-02-18 NOTE — Progress Notes (Deleted)
[facility] day:  02/18/2016  Chief Complaint: Selena Pittman is a 65 y.o. female with stage IIA left breast cancer who is seen for reassessment.  HPI: She presented with an abnormal mammogram.  1.9 x 1.4 cm left breast asymmetry.  Lumpectomy with sentinel lymph node biopsy on 12/31/2014 by Dr. Jamal Collin.  Pathology revealed a 2.4 cm.  Two sentinel lymph nodes were negative.  Positive margin then She underwent mastectomy on 01/14/2015.  carcinoma of left breast status post modified radical mastectomy (October, 2016) T2 N0 M0 tumor estrogen receptor positive progesterone receptor positive. Multigated analysis with Mammo print has been reported to be low risk Invasive lobular carcinoma Estrogen receptor positive.  Progesterone receptor positive.  HER-2 receptor by fish negative  She is on letrozole.  Past Medical History:  Diagnosis Date  . Bipolar affective ([place])   . Cancer ([place]) 12-11-14   INVASIVE MAMMARY CARCINOMA/.left breast/ T2 N0  . Cardiomegaly   . Depression   . Emphysema of lung ([place])   . Endometriosis   . Hyperlipidemia   . Osteoporosis   . Schizoaffective disorder ([place])   . Schizophrenia ([place])   . Thyroid nodule   . Vitamin D deficiency     Past Surgical History:  Procedure Laterality Date  . ABDOMINAL HYSTERECTOMY    . BREAST BIOPSY Left 12-11-14   INVASIVE MAMMARY CARCINOMA.  Marland Kitchen BREAST LUMPECTOMY WITH SENTINEL LYMPH NODE BIOPSY Left 12/31/2014   Procedure: LEFT BREAST LUMPECTOMY WITH ULTRASOUND GUIDED NEEDLE LOCALIZATION, SENTINEL LYMPH NODE BIOPSY ;  Surgeon: Christene Lye, MD;  Location: [facility] ORS;  Service: General;  Laterality: Left;  . DIAGNOSTIC MAMMOGRAM  12/04/2014   Done at [facility] Imaging Category 5-Left Breast  . DILATION AND CURETTAGE OF UTERUS    . SIMPLE MASTECTOMY WITH AXILLARY SENTINEL NODE BIOPSY Left 01/14/2015   Procedure: SIMPLE MASTECTOMY;  Surgeon: Christene Lye, MD;  Location: [facility] ORS;   Service: General;  Laterality: Left;  . TENDON REPAIR Right    hand  . TONSILLECTOMY    . TUBAL LIGATION      Family History  Problem Relation Age of Onset  . Cancer Mother     uterine  . Heart attack Father     Social History:  reports that she has been smoking Cigarettes.  She has a 9.00 pack-year smoking history. She has never used smokeless tobacco. She reports that she does not drink alcohol or use drugs.  The patient has schizophrenia and has been in family care for 10 years. The patient is accompanied by *** alone today.  Allergies: No Known Allergies  Current Medications: Current Outpatient Prescriptions  Medication Sig Dispense Refill  . alendronate (FOSAMAX) 70 MG tablet Take 70 mg by mouth once a week. Take with a full glass of water on an empty stomach.    . cetirizine (ZYRTEC) 10 MG tablet Take 10 mg by mouth daily.    . cholecalciferol (VITAMIN D) 1000 UNITS tablet Take 2,000 Units by mouth daily.    . cloZAPine (CLOZARIL) 100 MG tablet Take 400 mg by mouth daily.    . hydrOXYzine (ATARAX/VISTARIL) 25 MG tablet Take 25 mg by mouth 3 (three) times daily as needed.    Marland Kitchen letrozole (FEMARA) 2.5 MG tablet Take 1 tablet (2.5 mg total) by mouth daily. 90 tablet 1  . Linaclotide (LINZESS) 145 MCG CAPS capsule Take 145 mcg by mouth daily.    Marland Kitchen omeprazole (PRILOSEC) 20 MG capsule Take 20  mg by mouth daily.    . simvastatin (ZOCOR) 20 MG tablet Take 20 mg by mouth daily.     No current facility-administered medications for this visit.     Review of Systems:  GENERAL:  Feels good.  Active.  No fevers, sweats or weight loss. PERFORMANCE STATUS (ECOG):  *** HEENT:  No visual changes, runny nose, sore throat, mouth sores or tenderness. Lungs: No shortness of breath or cough.  No hemoptysis. Cardiac:  No chest pain, palpitations, orthopnea, or PND. GI:  No nausea, vomiting, diarrhea, constipation, melena or hematochezia. GU:  No urgency, frequency, dysuria, or  hematuria. Musculoskeletal:  No back pain.  No joint pain.  No muscle tenderness. Extremities:  No pain or swelling. Skin:  No rashes or skin changes. Neuro:  No headache, numbness or weakness, balance or coordination issues. Endocrine:  No diabetes, thyroid issues, hot flashes or night sweats. Psych:  No mood changes, depression or anxiety. Pain:  No focal pain. Review of systems:  All other systems reviewed and found to be negative.   Physical Exam: There were no vitals taken for this visit. GENERAL:  Well developed, well nourished, sitting comfortably in the exam room in no acute distress. MENTAL STATUS:  Alert and oriented to person, place and time. HEAD:  *** hair.  Normocephalic, atraumatic, face symmetric, no Cushingoid features. EYES:  *** eyes.  Pupils equal round and reactive to light and accomodation.  No conjunctivitis or scleral icterus. ENT:  Oropharynx clear without lesion.  Tongue normal. Mucous membranes moist.  RESPIRATORY:  Clear to auscultation without rales, wheezes or rhonchi. CARDIOVASCULAR:  Regular rate and rhythm without murmur, rub or gallop. ABDOMEN:  Soft, non-tender, with active bowel sounds, and no hepatosplenomegaly.  No masses. SKIN:  No rashes, ulcers or lesions. EXTREMITIES: No edema, no skin discoloration or tenderness.  No palpable cords. LYMPH NODES: No palpable cervical, supraclavicular, axillary or inguinal adenopathy  NEUROLOGICAL: Unremarkable. PSYCH:  Appropriate.  No visits with results within 3 Day(s) from this visit.  Latest known visit with results is:  Office Visit on 11/21/2015  Component Date Value Ref Range Status  . CA 27.29 11/26/2015 16.2  0.0 - 38.6 U/mL Final  . CEA 11/26/2015 5.6* 0.0 - 4.7 ng/mL Final   Comment:        Roche ECLIA methodology       Nonsmokers  <3.9                                      Smokers     <5.6     Assessment:  Selena Pittman is a 65 y.o. female  ***  Plan: 1. *** 2. *** 3. *** 4. *** 5. ***  Lequita Asal, MD  02/18/2016, 5:08 AM

## 2016-05-14 ENCOUNTER — Encounter: Payer: Self-pay | Admitting: *Deleted

## 2016-05-20 ENCOUNTER — Ambulatory Visit: Payer: Medicaid Other | Admitting: General Surgery

## 2016-07-16 ENCOUNTER — Encounter: Payer: Self-pay | Admitting: *Deleted

## 2016-07-20 ENCOUNTER — Ambulatory Visit: Payer: Medicaid Other | Admitting: General Surgery

## 2016-07-22 ENCOUNTER — Ambulatory Visit (INDEPENDENT_AMBULATORY_CARE_PROVIDER_SITE_OTHER): Payer: Medicaid Other | Admitting: General Surgery

## 2016-07-22 ENCOUNTER — Encounter: Payer: Self-pay | Admitting: General Surgery

## 2016-07-22 VITALS — BP 114/70 | HR 84 | Resp 16 | Ht 64.0 in | Wt 108.0 lb

## 2016-07-22 DIAGNOSIS — C50212 Malignant neoplasm of upper-inner quadrant of left female breast: Secondary | ICD-10-CM

## 2016-07-22 DIAGNOSIS — Z170 Estrogen receptor positive status [ER+]: Secondary | ICD-10-CM

## 2016-07-22 NOTE — Patient Instructions (Signed)
This information has been scribed by Karie Fetch RN, BSN,BC.

## 2016-07-22 NOTE — Progress Notes (Signed)
Patient ID: Selena Pittman, female   DOB: Jan 22, 1951, 66 y.o.   MRN: [id number]  Chief Complaint  Patient presents with  . Follow-up    HPI Selena Pittman is a 66 y.o. female.  who presents for her follow up left breast cancer and a breast evaluation. The most recent mammogram was done in July 2017 .  Patient does perform regular self breast checks and gets regular mammograms done.   Tolerating the Femara.   HPI  Past Medical History:  Diagnosis Date  . Bipolar affective ([place])   . Cancer ([place]) 12-11-14   INVASIVE MAMMARY CARCINOMA/.left breast/ T2 N0  . Cardiomegaly   . Depression   . Emphysema of lung ([place])   . Endometriosis   . Hyperlipidemia   . Osteoporosis   . Schizoaffective disorder ([place])   . Schizophrenia ([place])   . Thyroid nodule   . Vitamin D deficiency     Past Surgical History:  Procedure Laterality Date  . ABDOMINAL HYSTERECTOMY    . BREAST BIOPSY Left 12-11-14   INVASIVE MAMMARY CARCINOMA.  Marland Kitchen BREAST LUMPECTOMY WITH SENTINEL LYMPH NODE BIOPSY Left 12/31/2014   Procedure: LEFT BREAST LUMPECTOMY WITH ULTRASOUND GUIDED NEEDLE LOCALIZATION, SENTINEL LYMPH NODE BIOPSY ;  Surgeon: Christene Lye, MD;  Location: [facility] ORS;  Service: General;  Laterality: Left;  . DIAGNOSTIC MAMMOGRAM  12/04/2014   Done at [facility] Imaging Category 5-Left Breast  . DILATION AND CURETTAGE OF UTERUS    . SIMPLE MASTECTOMY WITH AXILLARY SENTINEL NODE BIOPSY Left 01/14/2015   Procedure: SIMPLE MASTECTOMY;  Surgeon: Christene Lye, MD;  Location: [facility] ORS;  Service: General;  Laterality: Left;  . TENDON REPAIR Right    hand  . TONSILLECTOMY    . TUBAL LIGATION      Family History  Problem Relation Age of Onset  . Cancer Mother     uterine  . Heart attack Father     Social History Social History  Substance Use Topics  . Smoking status: Current Every Day Smoker    Packs/day: 0.20    Years: 45.00    Types: Cigarettes  . Smokeless tobacco: Never Used  . Alcohol  use No    No Known Allergies  Current Outpatient Prescriptions  Medication Sig Dispense Refill  . alendronate (FOSAMAX) 70 MG tablet Take 70 mg by mouth once a week. Take with a full glass of water on an empty stomach.    . cetirizine (ZYRTEC) 10 MG tablet Take 10 mg by mouth daily.    . cholecalciferol (VITAMIN D) 1000 UNITS tablet Take 2,000 Units by mouth daily.    . cloZAPine (CLOZARIL) 100 MG tablet Take 400 mg by mouth daily.    . hydrOXYzine (ATARAX/VISTARIL) 25 MG tablet Take 25 mg by mouth 3 (three) times daily as needed.    Marland Kitchen letrozole (FEMARA) 2.5 MG tablet Take 1 tablet (2.5 mg total) by mouth daily. 90 tablet 1  . Linaclotide (LINZESS) 145 MCG CAPS capsule Take 145 mcg by mouth daily.    Marland Kitchen omeprazole (PRILOSEC) 20 MG capsule Take 20 mg by mouth daily.    . simvastatin (ZOCOR) 20 MG tablet Take 20 mg by mouth daily.     No current facility-administered medications for this visit.     Review of Systems Review of Systems  Blood pressure 114/70, pulse 84, resp. rate 16, height 5\' 4"  (1.626 m), weight 108 lb (49 kg).  Physical Exam Physical Exam  Constitutional: She is oriented to person, place, and  time. She appears well-developed and well-nourished.  Eyes: Conjunctivae are normal. No scleral icterus.  Neck: Neck supple.  Cardiovascular: Normal rate, regular rhythm and normal heart sounds.   Pulmonary/Chest: Effort normal and breath sounds normal. Right breast exhibits no inverted nipple, no mass, no nipple discharge, no skin change and no tenderness.  Left mastectomy site remains well healed with no sign of local recurrence  Abdominal: Soft. Bowel sounds are normal. There is no hepatomegaly. There is no tenderness.  Lymphadenopathy:    She has no cervical adenopathy.  Neurological: She is alert and oriented to person, place, and time.  Skin: Skin is warm and dry.    Data Reviewed Prior notes  Assessment    S/P left mastectomy, 2.4cm Invasive lobular CA. T2,N0.  She is 20 mos out from her initial treatment    Plan    Obtain CEA CA 27.29 today The patient has been asked to return to the office in 5 months with a unilateral right breast diagnostic mammogram.      This information has been scribed by Karie Fetch RN, BSN,BC.  I have completed the exam and reviewed the above documentation for accuracy and completeness.  I agree with the above.  Haematologist has been used and any errors in dictation or transcription are unintentional.  [place] Camps G. Jamal Collin, M.D., F.A.C.S.  Junie Panning G 07/27/2016, 2:27 PM

## 2016-07-27 ENCOUNTER — Encounter: Payer: Self-pay | Admitting: General Surgery

## 2016-08-12 ENCOUNTER — Other Ambulatory Visit: Payer: Self-pay | Admitting: Oncology

## 2016-08-12 DIAGNOSIS — C50919 Malignant neoplasm of unspecified site of unspecified female breast: Secondary | ICD-10-CM

## 2016-09-12 LAB — CEA: CEA: 5.8 ng/mL — AB (ref 0.0–4.7)

## 2016-09-12 LAB — CANCER ANTIGEN 27.29: CA 27.29: 20.4 U/mL (ref 0.0–38.6)

## 2016-10-09 ENCOUNTER — Other Ambulatory Visit: Payer: Self-pay | Admitting: Oncology

## 2016-10-09 DIAGNOSIS — C50919 Malignant neoplasm of unspecified site of unspecified female breast: Secondary | ICD-10-CM

## 2016-10-15 ENCOUNTER — Telehealth: Payer: Self-pay | Admitting: *Deleted

## 2016-10-15 NOTE — Telephone Encounter (Signed)
May discontinue percocet and tramadol medications per Dr Jamal Collin, not filled since 2016.

## 2016-10-27 ENCOUNTER — Other Ambulatory Visit: Payer: Self-pay

## 2016-10-27 DIAGNOSIS — Z1231 Encounter for screening mammogram for malignant neoplasm of breast: Secondary | ICD-10-CM

## 2016-10-29 IMAGING — MG MM BREAST SURGICAL SPECIMEN
2 series · 2 of 2 positions shown · non-contrast
Comparison: none

[L SPECIMEN (1 of 2)]
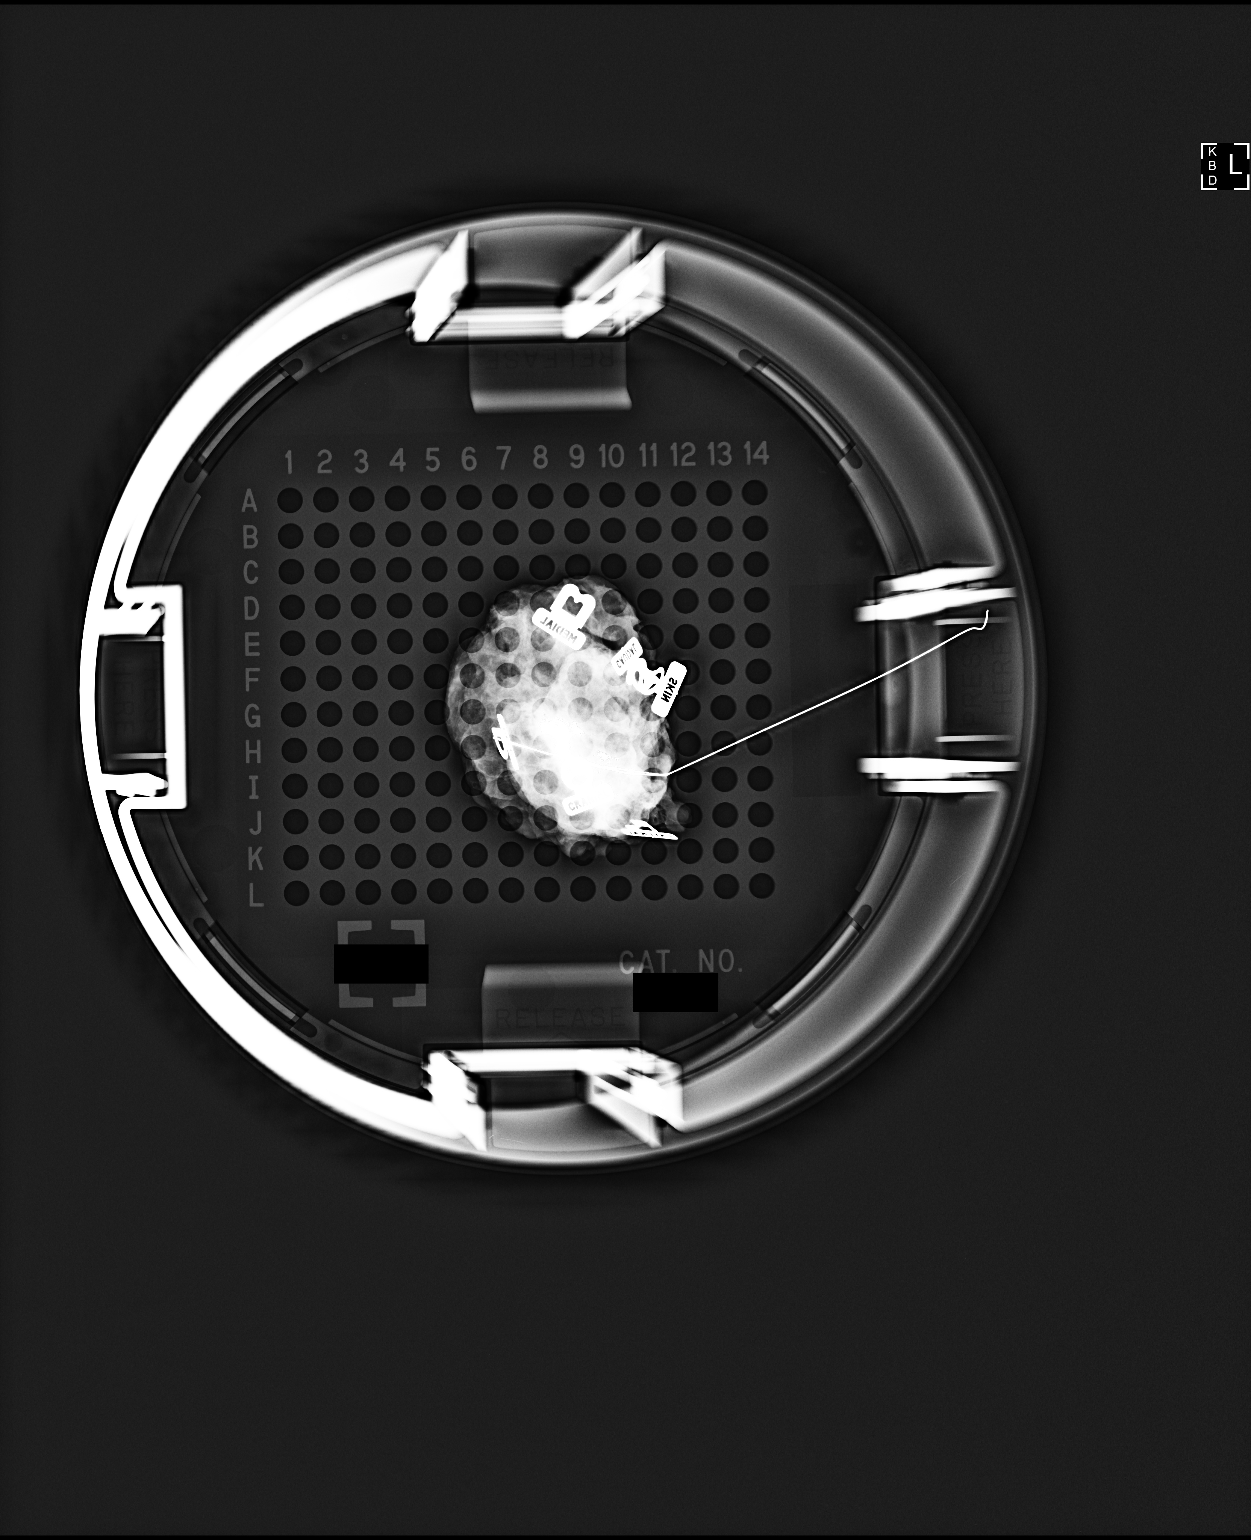

[L SPECIMEN (2 of 2)]
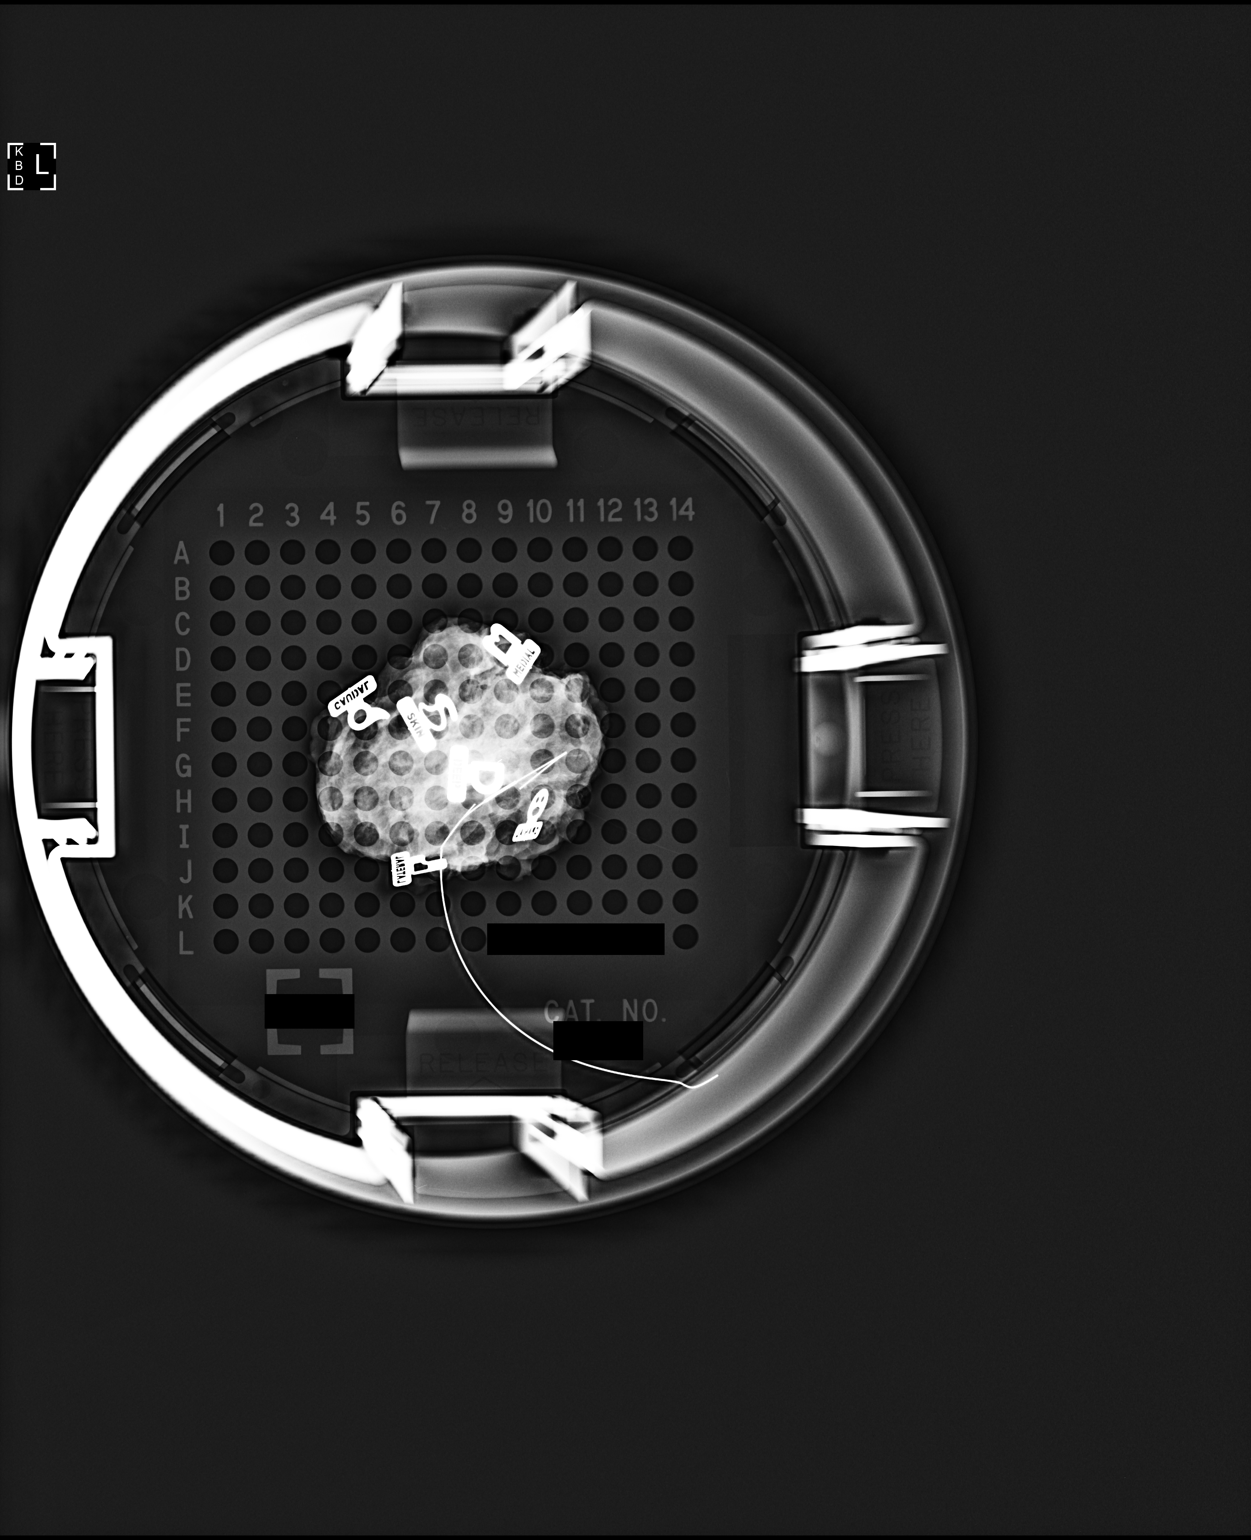

[2 of 2 positions shown; findings below may reference images not displayed]

Canned report from images found in remote index.

Refer to host system for actual result text.

## 2016-10-30 ENCOUNTER — Other Ambulatory Visit: Payer: Self-pay | Admitting: *Deleted

## 2016-10-30 ENCOUNTER — Inpatient Hospital Stay
Admission: RE | Admit: 2016-10-30 | Discharge: 2016-10-30 | Disposition: A | Discharge status: Home / Self Care | Payer: Self-pay | Source: Ambulatory Visit | Attending: *Deleted | Admitting: *Deleted

## 2016-10-30 DIAGNOSIS — Z9289 Personal history of other medical treatment: Secondary | ICD-10-CM

## 2016-11-13 ENCOUNTER — Other Ambulatory Visit: Payer: Self-pay | Admitting: Oncology

## 2016-11-13 DIAGNOSIS — C50919 Malignant neoplasm of unspecified site of unspecified female breast: Secondary | ICD-10-CM

## 2016-11-19 ENCOUNTER — Ambulatory Visit
Admission: RE | Admit: 2016-11-19 | Discharge: 2016-11-19 | Disposition: A | Discharge status: Home / Self Care | Payer: Medicaid Other | Source: Ambulatory Visit | Attending: General Surgery | Admitting: General Surgery

## 2016-11-19 DIAGNOSIS — Z1231 Encounter for screening mammogram for malignant neoplasm of breast: Secondary | ICD-10-CM | POA: Diagnosis not present

## 2016-12-01 ENCOUNTER — Ambulatory Visit (INDEPENDENT_AMBULATORY_CARE_PROVIDER_SITE_OTHER): Payer: Medicaid Other | Admitting: General Surgery

## 2016-12-01 ENCOUNTER — Encounter: Payer: Self-pay | Admitting: General Surgery

## 2016-12-01 VITALS — BP 118/74 | HR 76 | Resp 12 | Ht 62.0 in | Wt 106.0 lb

## 2016-12-01 DIAGNOSIS — C50212 Malignant neoplasm of upper-inner quadrant of left female breast: Secondary | ICD-10-CM

## 2016-12-01 DIAGNOSIS — Z170 Estrogen receptor positive status [ER+]: Secondary | ICD-10-CM | POA: Diagnosis not present

## 2016-12-01 NOTE — Progress Notes (Signed)
Patient ID: Selena Pittman, female   DOB: 15-Jun-1950, 66 y.o.   MRN: [id number]  Chief Complaint  Patient presents with  . Follow-up    mammogram    HPI Selena Pittman is a 66 y.o. female who presents for a breast cancer follow up. The most recent mammogram was done on 11/19/16. Patient does perform regular self breast checks and gets regular mammograms done.  Patient states no new breast issues.  Selena Pittman her caregiver is present.    HPI  Past Medical History:  Diagnosis Date  . Bipolar affective ([place])   . Cancer ([place]) 12-11-14   INVASIVE MAMMARY CARCINOMA/.left breast/ T2 N0  . Cardiomegaly   . Depression   . Emphysema of lung ([place])   . Endometriosis   . Hyperlipidemia   . Osteoporosis   . Schizoaffective disorder ([place])   . Schizophrenia ([place])   . Thyroid nodule   . Vitamin D deficiency     Past Surgical History:  Procedure Laterality Date  . ABDOMINAL HYSTERECTOMY    . BREAST BIOPSY Left 12-11-14   INVASIVE MAMMARY CARCINOMA.  Marland Kitchen BREAST LUMPECTOMY WITH SENTINEL LYMPH NODE BIOPSY Left 12/31/2014   Procedure: LEFT BREAST LUMPECTOMY WITH ULTRASOUND GUIDED NEEDLE LOCALIZATION, SENTINEL LYMPH NODE BIOPSY ;  Surgeon: Christene Lye, MD;  Location: [facility] ORS;  Service: General;  Laterality: Left;  . DIAGNOSTIC MAMMOGRAM  12/04/2014   Done at [facility] Category 5-Left Breast  . DILATION AND CURETTAGE OF UTERUS    . SIMPLE MASTECTOMY WITH AXILLARY SENTINEL NODE BIOPSY Left 01/14/2015   Procedure: SIMPLE MASTECTOMY;  Surgeon: Christene Lye, MD;  Location: [facility] ORS;  Service: General;  Laterality: Left;  . TENDON REPAIR Right    hand  . TONSILLECTOMY    . TUBAL LIGATION      Family History  Problem Relation Age of Onset  . Cancer Mother        uterine  . Heart attack Father     Social History Social History  Substance Use Topics  . Smoking status: Current Every Day Smoker    Packs/day: 0.20    Years: 45.00    Types: Cigarettes  . Smokeless tobacco:  Never Used  . Alcohol use No    No Known Allergies  Current Outpatient Prescriptions  Medication Sig Dispense Refill  . alendronate (FOSAMAX) 70 MG tablet Take 70 mg by mouth once a week. Take with a full glass of water on an empty stomach.    . cetirizine (ZYRTEC) 10 MG tablet Take 10 mg by mouth daily.    . cholecalciferol (VITAMIN D) 1000 UNITS tablet Take 2,000 Units by mouth daily.    . cloZAPine (CLOZARIL) 100 MG tablet Take 400 mg by mouth daily.    . hydrOXYzine (ATARAX/VISTARIL) 25 MG tablet Take 25 mg by mouth 3 (three) times daily as needed.    Marland Kitchen letrozole (FEMARA) 2.5 MG tablet Take 1 tablet (2.5 mg total) by mouth daily. 90 tablet 1  . Linaclotide (LINZESS) 145 MCG CAPS capsule Take 145 mcg by mouth daily.    Marland Kitchen omeprazole (PRILOSEC) 20 MG capsule Take 20 mg by mouth daily.    . simvastatin (ZOCOR) 20 MG tablet Take 20 mg by mouth daily.     No current facility-administered medications for this visit.     Review of Systems Review of Systems  Constitutional: Negative.   Respiratory: Negative.   Cardiovascular: Negative.     Blood pressure 118/74, pulse 76, resp. rate 12, height 5\' 2"  (  1.575 m), weight 106 lb (48.1 kg).  Physical Exam Physical Exam  Constitutional: She is oriented to person, place, and time. She appears well-developed and well-nourished.  Eyes: Conjunctivae are normal. No scleral icterus.  Neck: Neck supple.  Cardiovascular: Normal rate, regular rhythm and normal heart sounds.   Pulmonary/Chest: Effort normal and breath sounds normal. Right breast exhibits no inverted nipple, no mass, no nipple discharge, no skin change and no tenderness.    Abdominal: Soft. Bowel sounds are normal. There is no tenderness.  Lymphadenopathy:    She has no cervical adenopathy.    She has no axillary adenopathy.  Neurological: She is alert and oriented to person, place, and time.  Skin: Skin is warm and dry.    Data Reviewed  prior notes  CEA and CA 27-29 done  in may of 2018.   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     Plan Follow-up in 1 year with Dr. Bary Castilla, will be due for right screening mammogram      HPI, Physical Exam, Assessment and Plan have been scribed under the direction and in the presence of Mckinley Jewel, MD.  Verlene Mayer, CMA  I have completed the exam and reviewed the above documentation for accuracy and completeness.  I agree with the above.  Haematologist has been used and any errors in dictation or transcription are unintentional.  Seeplaputhur G. Jamal Collin, M.D., F.A.C.S.  Junie Panning G 12/01/2016, 12:05 PM

## 2016-12-01 NOTE — Patient Instructions (Addendum)
Follow-up in 1 year with Dr. Bary Castilla, will be due for unilateral right screening mammogram

## 2017-10-13 ENCOUNTER — Other Ambulatory Visit: Payer: Self-pay

## 2017-10-13 DIAGNOSIS — Z1231 Encounter for screening mammogram for malignant neoplasm of breast: Secondary | ICD-10-CM

## 2017-11-23 ENCOUNTER — Ambulatory Visit: Payer: Medicaid Other | Attending: General Surgery

## 2017-11-24 ENCOUNTER — Telehealth: Payer: Self-pay | Admitting: *Deleted

## 2017-11-24 NOTE — Telephone Encounter (Signed)
Called patient to reschedule her appointment on 11/30/17 for f/u mammogram, patient did not have her mammogram, I was unable to leave a message due to the only number being disconnected

## 2017-11-30 ENCOUNTER — Ambulatory Visit: Payer: Self-pay | Admitting: General Surgery

## 2017-12-07 ENCOUNTER — Ambulatory Visit
Admission: RE | Admit: 2017-12-07 | Discharge: 2017-12-07 | Disposition: A | Discharge status: Home / Self Care | Payer: Medicare Other | Source: Ambulatory Visit | Attending: General Surgery | Admitting: General Surgery

## 2017-12-07 DIAGNOSIS — Z1231 Encounter for screening mammogram for malignant neoplasm of breast: Secondary | ICD-10-CM | POA: Insufficient documentation

## 2017-12-07 HISTORY — DX: Malignant neoplasm of unspecified site of unspecified female breast: C50.919

## 2018-01-18 ENCOUNTER — Encounter: Payer: Self-pay | Admitting: General Surgery

## 2018-01-18 ENCOUNTER — Ambulatory Visit (INDEPENDENT_AMBULATORY_CARE_PROVIDER_SITE_OTHER): Payer: Medicare Other | Admitting: General Surgery

## 2018-01-18 VITALS — BP 126/72 | HR 92 | Resp 18 | Ht 62.0 in | Wt 104.0 lb

## 2018-01-18 DIAGNOSIS — C50212 Malignant neoplasm of upper-inner quadrant of left female breast: Secondary | ICD-10-CM

## 2018-01-18 DIAGNOSIS — Z170 Estrogen receptor positive status [ER+]: Secondary | ICD-10-CM

## 2018-01-18 NOTE — Progress Notes (Signed)
Patient ID: Selena Pittman, female   DOB: Oct 08, 1950, 67 y.o.   MRN: [id number]  Chief Complaint  Patient presents with  . Follow-up    HPI Selena Pittman is a 67 y.o. female who presents for her follow up left breast cancer and a breast evaluation, former patient of Dr Jamal Collin. The most recent mammogram was done on 12/07/2017.   Patient does perform regular self breast checks and gets regular mammograms done.  No new breast issues. She is here with caregiver of the [place].  HPI  Past Medical History:  Diagnosis Date  . Bipolar affective ([place])   . Breast cancer ([place])   . Cancer ([place]) 12-11-14   INVASIVE MAMMARY CARCINOMA/.left breast/ T2 N0  . Cardiomegaly   . Depression   . Emphysema of lung ([place])   . Endometriosis   . Hyperlipidemia   . Osteoporosis   . Schizoaffective disorder ([place])   . Schizophrenia ([place])   . Thyroid nodule   . Vitamin D deficiency     Past Surgical History:  Procedure Laterality Date  . ABDOMINAL HYSTERECTOMY    . BREAST BIOPSY Left 12-11-14   INVASIVE MAMMARY CARCINOMA.  Marland Kitchen BREAST LUMPECTOMY WITH SENTINEL LYMPH NODE BIOPSY Left 12/31/2014   Procedure: LEFT BREAST LUMPECTOMY WITH ULTRASOUND GUIDED NEEDLE LOCALIZATION, SENTINEL LYMPH NODE BIOPSY ;  Surgeon: Christene Lye, MD;  Location: [facility] ORS;  Service: General;  Laterality: Left;  . DIAGNOSTIC MAMMOGRAM  12/04/2014   Done at [facility] Category 5-Left Breast  . DILATION AND CURETTAGE OF UTERUS    . MASTECTOMY Left 2016  . SIMPLE MASTECTOMY WITH AXILLARY SENTINEL NODE BIOPSY Left 01/14/2015   Procedure: SIMPLE MASTECTOMY;  Surgeon: Christene Lye, MD;  Location: [facility] ORS;  Service: General;  Laterality: Left;  . TENDON REPAIR Right    hand  . TONSILLECTOMY    . TUBAL LIGATION      Family History  Problem Relation Age of Onset  . Cancer Mother        uterine  . Heart attack Father   . Breast cancer Neg Hx     Social History Social History    Tobacco Use  . Smoking status: Current Every Day Smoker    Packs/day: 0.20    Years: 45.00    Pack years: 9.00    Types: Cigarettes  . Smokeless tobacco: Never Used  Substance Use Topics  . Alcohol use: No    Alcohol/week: 0.0 standard drinks  . Drug use: No    No Known Allergies  Current Outpatient Medications  Medication Sig Dispense Refill  . alendronate (FOSAMAX) 70 MG tablet Take 70 mg by mouth once a week. Take with a full glass of water on an empty stomach.    . cetirizine (ZYRTEC) 10 MG tablet Take 10 mg by mouth daily.    . cholecalciferol (VITAMIN D) 1000 UNITS tablet Take 2,000 Units by mouth daily.    . cloZAPine (CLOZARIL) 100 MG tablet Take 400 mg by mouth daily.    . hydrOXYzine (ATARAX/VISTARIL) 25 MG tablet Take 25 mg by mouth 3 (three) times daily as needed.    Marland Kitchen letrozole (FEMARA) 2.5 MG tablet Take 1 tablet (2.5 mg total) by mouth daily. 90 tablet 1  . Linaclotide (LINZESS) 145 MCG CAPS capsule Take 280 mcg by mouth daily.     Marland Kitchen nystatin-triamcinolone (MYCOLOG II) cream Apply 1 application topically 2 (two) times daily.   2  . omeprazole (PRILOSEC) 20 MG capsule Take  20 mg by mouth daily.    . simvastatin (ZOCOR) 20 MG tablet Take 20 mg by mouth daily.     No current facility-administered medications for this visit.     Review of Systems Review of Systems  Constitutional: Negative.   Respiratory: Negative.   Cardiovascular: Negative.     Blood pressure 126/72, pulse 92, resp. rate 18, height 5\' 2"  (1.575 m), weight 104 lb (47.2 kg), SpO2 95 %.  Physical Exam Physical Exam  Constitutional: She is oriented to person, place, and time. She appears well-developed and well-nourished.  HENT:  Mouth/Throat: Oropharynx is clear and moist.  Eyes: Conjunctivae are normal. No scleral icterus.  Neck: Neck supple.  Cardiovascular: Normal rate, regular rhythm and normal heart sounds.  Pulmonary/Chest: Effort normal and breath sounds normal. Right breast  exhibits no inverted nipple, no mass, no nipple discharge, no skin change and no tenderness. Left breast exhibits no inverted nipple, no mass, no nipple discharge, no skin change and no tenderness.    Lymphadenopathy:    She has no cervical adenopathy.    She has no axillary adenopathy.       Right: No supraclavicular adenopathy present.       Left: No supraclavicular adenopathy present.  Neurological: She is alert and oriented to person, place, and time.  Skin: Skin is warm and dry.  Psychiatric: Her behavior is normal.    Data Reviewed Screening right breast mammogram dated December 07, 2017 was reviewed.  BI-RADS-1.  Assessment    Doing well post left mastectomy.  No evidence of contralateral disease.    Plan    Patient will be asked to return to the office in one year with a right screening mammogram.  The patient continues to tolerate letrozole well.  This will be continued for an additional 2 years.      HPI, Physical Exam, Assessment and Plan have been scribed under the direction and in the presence of Robert Bellow, MD. Karie Fetch, RN   I have completed the exam and reviewed the above documentation for accuracy and completeness.  I agree with the above.  Haematologist has been used and any errors in dictation or transcription are unintentional.  Hervey Ard, M.D., F.A.C.S.  Forest Gleason Senta Kantor 01/19/2018, 9:18 PM

## 2018-01-18 NOTE — Patient Instructions (Addendum)
The patient is aware to call back for any questions or new concerns. Patient will be asked to return to the office in one year with a right screening mammogram.

## 2018-07-21 ENCOUNTER — Emergency Department
Admission: EM | Admit: 2018-07-21 | Discharge: 2018-07-21 | Disposition: A | Discharge status: Home / Self Care | Payer: Medicare Other | Attending: Emergency Medicine | Admitting: Emergency Medicine

## 2018-07-21 ENCOUNTER — Emergency Department: Payer: Medicare Other

## 2018-07-21 ENCOUNTER — Other Ambulatory Visit: Payer: Self-pay

## 2018-07-21 DIAGNOSIS — Y929 Unspecified place or not applicable: Secondary | ICD-10-CM | POA: Insufficient documentation

## 2018-07-21 DIAGNOSIS — S42212A Unspecified displaced fracture of surgical neck of left humerus, initial encounter for closed fracture: Secondary | ICD-10-CM | POA: Diagnosis not present

## 2018-07-21 DIAGNOSIS — Z79899 Other long term (current) drug therapy: Secondary | ICD-10-CM | POA: Insufficient documentation

## 2018-07-21 DIAGNOSIS — I6789 Other cerebrovascular disease: Secondary | ICD-10-CM | POA: Diagnosis not present

## 2018-07-21 DIAGNOSIS — Y939 Activity, unspecified: Secondary | ICD-10-CM | POA: Diagnosis not present

## 2018-07-21 DIAGNOSIS — M5030 Other cervical disc degeneration, unspecified cervical region: Secondary | ICD-10-CM | POA: Insufficient documentation

## 2018-07-21 DIAGNOSIS — Z853 Personal history of malignant neoplasm of breast: Secondary | ICD-10-CM | POA: Insufficient documentation

## 2018-07-21 DIAGNOSIS — S40922A Unspecified superficial injury of left upper arm, initial encounter: Secondary | ICD-10-CM | POA: Diagnosis present

## 2018-07-21 DIAGNOSIS — Y999 Unspecified external cause status: Secondary | ICD-10-CM | POA: Diagnosis not present

## 2018-07-21 DIAGNOSIS — F17210 Nicotine dependence, cigarettes, uncomplicated: Secondary | ICD-10-CM | POA: Diagnosis not present

## 2018-07-21 MED ORDER — OXYCODONE-ACETAMINOPHEN 5-325 MG PO TABS: 1.0000 | ORAL_TABLET | ORAL | 0 refills | Status: AC | PRN

## 2018-07-21 MED ORDER — OXYCODONE-ACETAMINOPHEN 5-325 MG PO TABS: 1.0000 | ORAL_TABLET | ORAL | 0 refills | Status: DC | PRN

## 2018-07-21 MED ORDER — OXYCODONE-ACETAMINOPHEN 5-325 MG PO TABS
1.0000 | ORAL_TABLET | Freq: Once | ORAL | Status: AC
  Administered 2018-07-21: 20:00:00 1 via ORAL
  Filled 2018-07-21: qty 1
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
## 2018-07-21 NOTE — ED Notes (Signed)
Patient transported to CT 
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
## 2018-07-21 NOTE — Discharge Instructions (Addendum)
Please seek medical attention for any high fevers, chest pain, shortness of breath, change in behavior, persistent vomiting, bloody stool or any other new or concerning symptoms.  

## 2018-07-21 NOTE — ED Notes (Signed)
Patient transported to X-ray & CT °

## 2018-07-21 NOTE — ED Notes (Signed)
Called to [facility] report given. Confirmed pharmacy and resent prescription to correct pharmacy

## 2018-11-07 ENCOUNTER — Encounter: Payer: Self-pay | Admitting: General Surgery

## 2018-11-10 ENCOUNTER — Other Ambulatory Visit: Payer: Self-pay

## 2018-11-10 DIAGNOSIS — Z1231 Encounter for screening mammogram for malignant neoplasm of breast: Secondary | ICD-10-CM

## 2018-11-15 ENCOUNTER — Telehealth: Payer: Self-pay | Admitting: *Deleted

## 2018-11-15 NOTE — Telephone Encounter (Signed)
Patient states she no longer want to have her mammogram and dose not want to see any other doctor in your office

## 2018-12-12 ENCOUNTER — Ambulatory Visit: Payer: Medicare Other

## 2019-12-11 ENCOUNTER — Ambulatory Visit (INDEPENDENT_AMBULATORY_CARE_PROVIDER_SITE_OTHER): Payer: Medicare Other | Admitting: Dermatology

## 2019-12-11 ENCOUNTER — Other Ambulatory Visit: Payer: Self-pay

## 2019-12-11 DIAGNOSIS — L409 Psoriasis, unspecified: Secondary | ICD-10-CM | POA: Diagnosis not present

## 2019-12-11 MED ORDER — SALICYLIC ACID 2 % EX SHAM: MEDICATED_SHAMPOO | CUTANEOUS | 5 refills | Status: DC

## 2019-12-11 MED ORDER — MOMETASONE FUROATE 0.1 % EX SOLN: CUTANEOUS | 2 refills | Status: DC

## 2019-12-11 MED ORDER — KETOCONAZOLE 2 % EX SHAM: MEDICATED_SHAMPOO | CUTANEOUS | 5 refills | Status: DC

## 2019-12-11 NOTE — Progress Notes (Signed)
   New Patient Visit  Subjective  Selena Pittman is a 69 y.o. female who presents for the following: Skin Problem (Patient here today for itching and scale at the scalp. She is using Selsun Blue shampoo every other day and leaves it on for 5 minutes before rinsing. ). Patient's paperwork also shows ketoconazole 2% shampoo but patient advises she is using [employer].   The following portions of the chart were reviewed this encounter and updated as appropriate:  Tobacco  Allergies  Meds  Problems  Med Hx  Surg Hx  Fam Hx     Review of Systems:  No other skin or systemic complaints except as noted in HPI or Assessment and Plan.  Objective  Well appearing patient in no apparent distress; mood and affect are within normal limits.  A focused examination was performed including scalp. Relevant physical exam findings are noted in the Assessment and Plan.  Objective  Scalp: Hyperkeratotic scale at scalp and forehead.   Images       Assessment & Plan  Psoriasis - severe Scalp  Start ketoconazole 2% shampoo Monday, Wednesday and Friday Start salicylic shampoo Tuesday, Thursday and Saturday Continue Selsun Blue or Head & Shoulders on Sunday  Start mometasone solution once daily at bedtime Monday through Friday (5 days per week).   coal tar-salicylic acid 2 % shampoo - Scalp - Wednesdays to wash scalp  ketoconazole (NIZORAL) 2 % shampoo - Scalp  mometasone (ELOCON) 0.1 % lotion - Scalp  Return in about 6 weeks (around 01/22/2020).  Graciella Belton, RMA, am acting as scribe for Sarina Ser, MD . Documentation: I have reviewed the above documentation for accuracy and completeness, and I agree with the above.  Sarina Ser, MD

## 2019-12-11 NOTE — Patient Instructions (Addendum)
Start ketoconazole 2% shampoo Monday, Wednesday and Friday Start salicylic shampoo (T-Sal recommended) Tuesday, Thursday and Saturday Continue Selsun Blue or Head & Shoulders on Sunday  Start mometasone solution once daily at bedtime Monday through Friday.

## 2019-12-19 ENCOUNTER — Encounter: Payer: Self-pay | Admitting: Dermatology

## 2020-01-22 ENCOUNTER — Ambulatory Visit (INDEPENDENT_AMBULATORY_CARE_PROVIDER_SITE_OTHER): Payer: Medicare Other | Admitting: Dermatology

## 2020-01-22 ENCOUNTER — Other Ambulatory Visit: Payer: Self-pay

## 2020-01-22 DIAGNOSIS — L409 Psoriasis, unspecified: Secondary | ICD-10-CM | POA: Diagnosis not present

## 2020-01-22 NOTE — Progress Notes (Signed)
   Follow-Up Visit   Subjective  Selena Pittman is a 69 y.o. female who presents for the following: Psoriasis (6 week follow up of scalp - Ketconazole 2% shampoo 3 times per week, Sal acid shampoo 3 times per week, Selsun Blue shampoo 1 time per week, Mometasone lotion 5 times per week - she feels like it is doing better).  The following portions of the chart were reviewed this encounter and updated as appropriate:  Tobacco  Allergies  Meds  Problems  Med Hx  Surg Hx  Fam Hx     Review of Systems:  No other skin or systemic complaints except as noted in HPI or Assessment and Plan.  Objective  Well appearing patient in no apparent distress; mood and affect are within normal limits.  A focused examination was performed including scalp. Relevant physical exam findings are noted in the Assessment and Plan.  Objective  Scalp: Some scale of scalp.  Images       Assessment & Plan  Psoriasis Scalp Improved but not clear.   Continue Ketoconazole 2% shampoo 3 times per week, Sal acid shampoo 3 times per week and Selsun Blue shampoo on Sunday.  Continue Mometasone lotion 5 times per week.  coal tar-salicylic acid 2 % shampoo - Scalp  ketoconazole (NIZORAL) 2 % shampoo - Scalp  mometasone (ELOCON) 0.1 % lotion - Scalp  Return in about 6 months (around 07/22/2020).   I, Ashok Cordia, CMA, am acting as scribe for Sarina Ser, MD .  Documentation: I have reviewed the above documentation for accuracy and completeness, and I agree with the above.  Sarina Ser, MD

## 2020-01-23 ENCOUNTER — Encounter: Payer: Self-pay | Admitting: Dermatology

## 2020-02-20 ENCOUNTER — Other Ambulatory Visit: Payer: Self-pay | Admitting: Dermatology

## 2020-02-20 DIAGNOSIS — L409 Psoriasis, unspecified: Secondary | ICD-10-CM

## 2020-09-10 ENCOUNTER — Other Ambulatory Visit: Payer: Self-pay | Admitting: Dermatology

## 2020-09-10 DIAGNOSIS — L409 Psoriasis, unspecified: Secondary | ICD-10-CM

## 2021-05-15 ENCOUNTER — Other Ambulatory Visit: Payer: Self-pay | Admitting: Family Medicine

## 2021-05-15 DIAGNOSIS — Z1231 Encounter for screening mammogram for malignant neoplasm of breast: Secondary | ICD-10-CM

## 2021-06-20 ENCOUNTER — Other Ambulatory Visit: Payer: Self-pay | Admitting: Family Medicine

## 2021-06-20 DIAGNOSIS — F17200 Nicotine dependence, unspecified, uncomplicated: Secondary | ICD-10-CM

## 2021-08-06 ENCOUNTER — Ambulatory Visit
Admission: RE | Admit: 2021-08-06 | Discharge: 2021-08-06 | Disposition: A | Discharge status: Home / Self Care | Payer: Medicare Other | Source: Ambulatory Visit | Attending: Family Medicine | Admitting: Family Medicine

## 2021-08-06 DIAGNOSIS — Z1231 Encounter for screening mammogram for malignant neoplasm of breast: Secondary | ICD-10-CM | POA: Diagnosis present

## 2021-12-03 ENCOUNTER — Other Ambulatory Visit: Payer: Self-pay

## 2021-12-03 ENCOUNTER — Emergency Department: Payer: Medicare Other

## 2021-12-03 ENCOUNTER — Emergency Department
Admission: EM | Admit: 2021-12-03 | Discharge: 2021-12-03 | Disposition: A | Discharge status: Home / Self Care | Payer: Medicare Other | Attending: Emergency Medicine | Admitting: Emergency Medicine

## 2021-12-03 DIAGNOSIS — R531 Weakness: Secondary | ICD-10-CM

## 2021-12-03 DIAGNOSIS — N3000 Acute cystitis without hematuria: Secondary | ICD-10-CM

## 2021-12-03 DIAGNOSIS — G8929 Other chronic pain: Secondary | ICD-10-CM | POA: Diagnosis not present

## 2021-12-03 LAB — BASIC METABOLIC PANEL
Anion gap: 7 (ref 5–15)
BUN: 8 mg/dL (ref 8–23)
CO2: 29 mmol/L (ref 22–32)
Calcium: 9.5 mg/dL (ref 8.9–10.3)
Chloride: 107 mmol/L (ref 98–111)
Creatinine, Ser: 0.78 mg/dL (ref 0.44–1.00)
GFR, Estimated: >60 mL/min (ref >60)
Glucose, Bld: 110 mg/dL — ABNORMAL HIGH (ref 70–99)
Potassium: 3.8 mmol/L (ref 3.5–5.1)
Sodium: 143 mmol/L (ref 135–145)

## 2021-12-03 LAB — URINALYSIS, ROUTINE W REFLEX MICROSCOPIC
Bilirubin Urine: NEGATIVE
Glucose, UA: NEGATIVE mg/dL
Hgb urine dipstick: NEGATIVE
Ketones, ur: NEGATIVE mg/dL
Nitrite: NEGATIVE
Protein, ur: NEGATIVE mg/dL
Specific Gravity, Urine: 1.005 (ref 1.005–1.030)
pH: 7 (ref 5.0–8.0)

## 2021-12-03 LAB — CBC WITH DIFFERENTIAL/PLATELET
Abs Immature Granulocytes: 0.02 10*3/uL (ref 0.00–0.07)
Basophils Absolute: 0 10*3/uL (ref 0.0–0.1)
Basophils Relative: 1 %
Eosinophils Absolute: 0.1 10*3/uL (ref 0.0–0.5)
Eosinophils Relative: 4 %
HCT: 44.7 % (ref 36.0–46.0)
Hemoglobin: 14.7 g/dL (ref 12.0–15.0)
Immature Granulocytes: 1 %
Lymphocytes Relative: 22 %
Lymphs Abs: 0.7 10*3/uL (ref 0.7–4.0)
MCH: 29.8 pg (ref 26.0–34.0)
MCHC: 32.9 g/dL (ref 30.0–36.0)
MCV: 90.5 fL (ref 80.0–100.0)
Monocytes Absolute: 0.3 10*3/uL (ref 0.1–1.0)
Monocytes Relative: 9 %
Neutro Abs: 2.1 10*3/uL (ref 1.7–7.7)
Neutrophils Relative %: 63 %
Platelets: 215 10*3/uL (ref 150–400)
RBC: 4.94 MIL/uL (ref 3.87–5.11)
RDW: 12.7 % (ref 11.5–15.5)
WBC: 3.3 10*3/uL — ABNORMAL LOW (ref 4.0–10.5)
nRBC: 0 % (ref 0.0–0.2)

## 2021-12-03 LAB — TROPONIN I (HIGH SENSITIVITY): Troponin I (High Sensitivity): 3 ng/L (ref <18)

## 2021-12-03 MED ORDER — CEPHALEXIN 500 MG PO CAPS: 500.0000 mg | ORAL_CAPSULE | Freq: Two times a day (BID) | ORAL | 0 refills | Status: AC

## 2021-12-03 MED ORDER — CEPHALEXIN 500 MG PO CAPS: 500.0000 mg | ORAL_CAPSULE | Freq: Once | ORAL | Status: DC

## 2021-12-03 NOTE — ED Notes (Addendum)
Pt assisted OOB multiple times to urinate, pt maintained steady gait as well.   Pt given lunch tray.   Pt also had small BM with no issues.

## 2021-12-03 NOTE — ED Notes (Signed)
Ninfa Linden (pt's caregiver) given report and is here to transport pt back to retirement home

## 2021-12-03 NOTE — ED Notes (Signed)
Pt is up for D/C at this time, this RN spoke to Lynette's husband who is the pt's caregiver. Caregiver educated on that pt does not qualify for a EMS ride due to pt being ambulatory, caregiver states they will come and get pt within the hour.

## 2021-12-03 NOTE — ED Provider Notes (Addendum)
[facility] Provider Note    Event Date/Time   First MD Initiated Contact with Patient 12/03/21 1105     (approximate)   History   Weakness   HPI Selena Pittman is a 71 y.o. female medical history of bipolar, cancer, emphysema of the lung, schizoaffective who comes in from a nursing facility  Weakness and pain chief complaint, from nursing facility, she states that the symptoms are chronic and unchanged.  Unknown why presenting today, patient states that she has had the symptoms for years and nothing is new today " I have 14 different medical problems" Denies falls, trauma, fever, chest pain, shortness of breath - no acute complaints.       Physical Exam   Triage Vital Signs: ED Triage Vitals  Enc Vitals Group     BP 12/03/21 1056 (!) 115/92     Pulse Rate 12/03/21 1056 89     Resp 12/03/21 1056 17     Temp 12/03/21 1056 98 F (36.7 C)     Temp Source 12/03/21 1056 Oral     SpO2 12/03/21 1056 98 %     Weight --      Height --      Head Circumference --      Peak Flow --      Pain Score 12/03/21 1057 10     Pain Loc --      Pain Edu? --      Excl. in [place]? --     Most recent vital signs: Vitals:   12/03/21 1400 12/03/21 1500  BP: 124/86 120/80  Pulse: 88 82  Resp: 19 17  Temp: 98.2 F (36.8 C)   SpO2: 99% 100%     General: Awake, no distress.  Signs of head trauma.  Supple neck with full range of motion. CV:  Good peripheral perfusion.  Resp:  Normal effort.  Abd:  No distention.  Soft and nontender to deep palpation in all quadrants. Other:  Moves all extremities, extraocular movements intact, no facial asymmetry.   ED Results / Procedures / Treatments   Labs (all labs ordered are listed, but only abnormal results are displayed) Labs Reviewed  BASIC METABOLIC PANEL - Abnormal; Notable for the following components:      Result Value   Glucose, Bld 110 (*)    All other components within normal limits  CBC WITH  DIFFERENTIAL/PLATELET - Abnormal; Notable for the following components:   WBC 3.3 (*)    All other components within normal limits  URINALYSIS, ROUTINE W REFLEX MICROSCOPIC - Abnormal; Notable for the following components:   Color, Urine YELLOW (*)    APPearance HAZY (*)    Leukocytes,Ua MODERATE (*)    Bacteria, UA FEW (*)    All other components within normal limits  URINE CULTURE  TROPONIN I (HIGH SENSITIVITY)    EKG  ED ECG REPORT I, Lucillie Garfinkel, the attending physician, personally viewed and interpreted this ECG.   Date: 12/03/2021  EKG Time: 1058  Rate: 89  Rhythm: normal EKG, normal sinus rhythm, unchanged from previous tracings, nonspecific ST and T waves changes  Axis: Normal  Intervals:none  ST&T Change: Nonspecific T wave changes V1 and V2    RADIOLOGY I independently reviewed and interpreted chest x-ray and see no overt pneumothorax or focal consolidation   PROCEDURES:  Critical Care performed: No  Procedures   MEDICATIONS ORDERED IN ED: Medications  cephALEXin (KEFLEX) capsule 500 mg (has no administration in time range)  IMPRESSION / MDM / ASSESSMENT AND PLAN / ED COURSE  I reviewed the triage vital signs and the nursing notes.                              Differential diagnosis includes, but is not limited to chronic pain, Bolick derangement, urinary tract infection, pneumonia, considered but less likely cardiac or traumatic.  Patient's presentation is most consistent with acute presentation with potential threat to life or bodily function.    I reviewed labs noted a white blood cell count, consistent with prior.  Patient remains in stable condition.  Chronic symptoms ongoing for many years.  No acute presenting symptoms per patient who looks well and is ambulating with a steady gait in the emergency department.  Reviewed labs and imaging as plan above. I considered hospitalization and observation for this patient but given chronicity of  symptoms and  findings assistant with urinary tract infection and this otherwise stable patient on work-up today, safe for discharge back to facility with antibiotics.       FINAL CLINICAL IMPRESSION(S) / ED DIAGNOSES   Final diagnoses:  Generalized weakness  Other chronic pain  Acute cystitis without hematuria     Rx / DC Orders   ED Discharge Orders          Ordered    cephALEXin (KEFLEX) 500 MG capsule  2 times daily        12/03/21 1441             Note:  This document was prepared using Dragon voice recognition software and may include unintentional dictation errors.    Lucillie Garfinkel, MD 12/03/21 4270    Lucillie Garfinkel, MD 12/03/21 [phone]

## 2021-12-03 NOTE — ED Triage Notes (Addendum)
Pt presents to ED with c/o of weakness and generalized pain for the past 26 years. Pt is A&Ox4. Pt denies any changes in weakness or pain.

## 2021-12-03 NOTE — Discharge Instructions (Addendum)
Take antibiotics for full course as prescribed.

## 2021-12-05 LAB — URINE CULTURE: Culture: >=100000 — AB

## 2021-12-11 ENCOUNTER — Encounter: Payer: Self-pay | Admitting: Intensive Care

## 2021-12-11 ENCOUNTER — Emergency Department
Admission: EM | Admit: 2021-12-11 | Discharge: 2021-12-11 | Disposition: A | Discharge status: Skilled Nursing Facility | Payer: Medicare Other | Attending: Emergency Medicine | Admitting: Emergency Medicine

## 2021-12-11 ENCOUNTER — Emergency Department: Payer: Medicare Other

## 2021-12-11 ENCOUNTER — Other Ambulatory Visit: Payer: Self-pay

## 2021-12-11 DIAGNOSIS — Z9012 Acquired absence of left breast and nipple: Secondary | ICD-10-CM | POA: Diagnosis not present

## 2021-12-11 DIAGNOSIS — Z87440 Personal history of urinary (tract) infections: Secondary | ICD-10-CM | POA: Insufficient documentation

## 2021-12-11 DIAGNOSIS — R109 Unspecified abdominal pain: Secondary | ICD-10-CM | POA: Insufficient documentation

## 2021-12-11 DIAGNOSIS — M47816 Spondylosis without myelopathy or radiculopathy, lumbar region: Secondary | ICD-10-CM | POA: Diagnosis not present

## 2021-12-11 DIAGNOSIS — R110 Nausea: Secondary | ICD-10-CM | POA: Insufficient documentation

## 2021-12-11 DIAGNOSIS — R531 Weakness: Secondary | ICD-10-CM | POA: Diagnosis not present

## 2021-12-11 DIAGNOSIS — R197 Diarrhea, unspecified: Secondary | ICD-10-CM | POA: Diagnosis not present

## 2021-12-11 DIAGNOSIS — M4316 Spondylolisthesis, lumbar region: Secondary | ICD-10-CM | POA: Insufficient documentation

## 2021-12-11 DIAGNOSIS — I700 Atherosclerosis of aorta: Secondary | ICD-10-CM | POA: Diagnosis not present

## 2021-12-11 DIAGNOSIS — Z90710 Acquired absence of both cervix and uterus: Secondary | ICD-10-CM | POA: Diagnosis not present

## 2021-12-11 DIAGNOSIS — Z20822 Contact with and (suspected) exposure to covid-19: Secondary | ICD-10-CM | POA: Insufficient documentation

## 2021-12-11 LAB — COMPREHENSIVE METABOLIC PANEL
ALT: 16 U/L (ref 0–44)
AST: 17 U/L (ref 15–41)
Albumin: 3.8 g/dL (ref 3.5–5.0)
Alkaline Phosphatase: 76 U/L (ref 38–126)
Anion gap: 10 (ref 5–15)
BUN: 11 mg/dL (ref 8–23)
CO2: 23 mmol/L (ref 22–32)
Calcium: 8.9 mg/dL (ref 8.9–10.3)
Chloride: 110 mmol/L (ref 98–111)
Creatinine, Ser: 0.79 mg/dL (ref 0.44–1.00)
GFR, Estimated: >60 mL/min (ref >60)
Glucose, Bld: 94 mg/dL (ref 70–99)
Potassium: 4.3 mmol/L (ref 3.5–5.1)
Sodium: 143 mmol/L (ref 135–145)
Total Bilirubin: 1 mg/dL (ref 0.3–1.2)
Total Protein: 6.2 g/dL — ABNORMAL LOW (ref 6.5–8.1)

## 2021-12-11 LAB — URINALYSIS, COMPLETE (UACMP) WITH MICROSCOPIC
Bacteria, UA: NONE SEEN
Bilirubin Urine: NEGATIVE
Glucose, UA: NEGATIVE mg/dL
Hgb urine dipstick: NEGATIVE
Ketones, ur: 20 mg/dL — AB
Leukocytes,Ua: NEGATIVE
Nitrite: NEGATIVE
Protein, ur: NEGATIVE mg/dL
Specific Gravity, Urine: 1.012 (ref 1.005–1.030)
Squamous Epithelial / HPF: NONE SEEN (ref 0–5)
pH: 6 (ref 5.0–8.0)

## 2021-12-11 LAB — SARS CORONAVIRUS 2 BY RT PCR: SARS Coronavirus 2 by RT PCR: NEGATIVE

## 2021-12-11 LAB — CBC
HCT: 45.6 % (ref 36.0–46.0)
Hemoglobin: 15 g/dL (ref 12.0–15.0)
MCH: 29.8 pg (ref 26.0–34.0)
MCHC: 32.9 g/dL (ref 30.0–36.0)
MCV: 90.7 fL (ref 80.0–100.0)
Platelets: 213 10*3/uL (ref 150–400)
RBC: 5.03 MIL/uL (ref 3.87–5.11)
RDW: 12.7 % (ref 11.5–15.5)
WBC: 3.9 10*3/uL — ABNORMAL LOW (ref 4.0–10.5)
nRBC: 0 % (ref 0.0–0.2)

## 2021-12-11 LAB — TROPONIN I (HIGH SENSITIVITY): Troponin I (High Sensitivity): 3 ng/L (ref <18)

## 2021-12-11 MED ORDER — SODIUM CHLORIDE 0.9 % IV BOLUS
1000.0000 mL | Freq: Once | INTRAVENOUS | Status: AC
  Administered 2021-12-11: 1000 mL via INTRAVENOUS

## 2021-12-11 MED ORDER — IOHEXOL 300 MG/ML  SOLN
75.0000 mL | Freq: Once | INTRAMUSCULAR | Status: AC | PRN
  Administered 2021-12-11: 75 mL via INTRAVENOUS

## 2021-12-11 MED ORDER — ONDANSETRON HCL 4 MG/2ML IJ SOLN
4.0000 mg | Freq: Once | INTRAMUSCULAR | Status: AC
  Administered 2021-12-11: 4 mg via INTRAVENOUS
  Filled 2021-12-11: qty 2

## 2021-12-11 MED ORDER — ACETAMINOPHEN 500 MG PO TABS
1000.0000 mg | ORAL_TABLET | Freq: Once | ORAL | Status: AC
  Administered 2021-12-11: 1000 mg via ORAL
  Filled 2021-12-11: qty 2

## 2021-12-11 NOTE — ED Notes (Signed)
[facility]  CALLED  FOR  TRANSPORT 

## 2021-12-11 NOTE — ED Triage Notes (Signed)
Arrived from [facility] by [employer]. Staff called for weakness. Seen for same on 12/03/21.   Patient reports she has not been getting out of bed to go eat due to feeling sick. Patient reports some diarrhea for months. Staff reports patient has been getting antibiotic for UTI  Blood sugar 116 142/92 blood pressure 98% RA

## 2021-12-11 NOTE — ED Notes (Signed)
Pt taken to CT.

## 2021-12-11 NOTE — ED Notes (Signed)
Pt refused to sign DC instructions.

## 2021-12-11 NOTE — ED Notes (Signed)
Pt back from CT

## 2021-12-11 NOTE — ED Provider Notes (Signed)
[facility] Provider Note    Event Date/Time   First MD Initiated Contact with Patient 12/11/21 [phone]     (approximate)  History   Chief Complaint: Weakness  HPI  Selena Pittman is a 71 y.o. female with a past medical history of bipolar, hyperlipidemia, schizophrenia, presents from her nursing facility for reported weakness.  According to EMS staff at the patient's nursing facility reported that the patient seemed more weak over the last few days.  They also state patient has been experiencing loose stool at times states this has been ongoing for months, patient states 1 year of diarrhea intermittently.  Denies any vomiting although states nausea this morning.  Per report patient recently placed on a antibiotic for urinary tract infection.  Patient denies any fever cough or congestion.  Overall the patient appears well, she is in no distress answers questions appropriately and is requesting that we turn her TV on to [employer].  Physical Exam   Triage Vital Signs: ED Triage Vitals  Enc Vitals Group     BP 12/11/21 0854 (!) 149/88     Pulse Rate 12/11/21 0854 (!) 108     Resp 12/11/21 0854 20     Temp 12/11/21 0854 98.2 F (36.8 C)     Temp Source 12/11/21 0854 Oral     SpO2 12/11/21 0854 98 %     Weight 12/11/21 0848 119 lb (54 kg)     Height 12/11/21 0848 '5\' 4"'$  (1.626 m)     Head Circumference --      Peak Flow --      Pain Score 12/11/21 0848 10     Pain Loc --      Pain Edu? --      Excl. in [place]? --     Most recent vital signs: Vitals:   12/11/21 0854  BP: (!) 149/88  Pulse: (!) 108  Resp: 20  Temp: 98.2 F (36.8 C)  SpO2: 98%    General: Awake, no distress.  CV:  Good peripheral perfusion.  Regular rate and rhythm  Resp:  Normal effort.  Equal breath sounds bilaterally.  Abd:  No distention.  Soft, nontender.  No rebound or guarding. Other:  Chronic appearing right upper extremity contracture.   ED Results / Procedures / Treatments    EKG  EKG viewed and interpreted by myself shows sinus tachycardia at 108 bpm with a narrow QRS, normal axis, normal intervals, nonspecific but no concerning ST changes.   MEDICATIONS ORDERED IN ED: Medications  sodium chloride 0.9 % bolus 1,000 mL (has no administration in time range)     IMPRESSION / MDM / ASSESSMENT AND PLAN / ED COURSE  I reviewed the triage vital signs and the nursing notes.  Patient's presentation is most consistent with acute presentation with potential threat to life or bodily function.  Patient presents to the emergency department for reported increased weakness.  Overall the patient appears well, no distress, reassuring physical exam.  Slight tachycardia otherwise reassuring vitals.  We will check labs including blood work, urinalysis, COVID swab.  We will IV hydrate while awaiting results.  Patient agreeable to plan.  Patient's work-up is reassuring.  Patient's troponin is negative.  CBC is normal, chemistry does not show any concerning findings.  COVID test is negative.  Patient's urinalysis is normal, no signs of urinary tract infection at least.  Patient continues to appear well.  Patient is eating lunch.  Given the patient's reassuring work-up I believe  the patient is safe for discharge home/to her nursing facility with PCP follow-up.  Patient will be discharged shortly.  FINAL CLINICAL IMPRESSION(S) / ED DIAGNOSES   Weakness   Note:  This document was prepared using Dragon voice recognition software and may include unintentional dictation errors.   Harvest Dark, MD 12/11/21 1323

## 2021-12-11 NOTE — ED Notes (Signed)
Pt was requesting food at this time. Pt was given graham crackers and cola. Pt tolerating food well.

## 2021-12-11 NOTE — ED Notes (Signed)
Ems  called to transport patient to [facility]

## 2021-12-11 NOTE — ED Notes (Signed)
ED Provider at bedside. PT has been dc'd. EMS is at bedside and pt is refusing to leave. Pt is yelling and screaming. Charge RN is at bedside.

## 2021-12-11 NOTE — ED Notes (Signed)
This RN called and spoke with care home owner, Willette Cluster. Per Willette Cluster, they have not been able to get in touch with pts psychiatrist and they are concerned because pt has not been getting out of bed to eat. Willette Cluster was informed that pt has been eating and asking for food while she has been here. Willette Cluster states that she thinks that pt needs to be evaluated by psych since she is not able to get in touch with the patients regular psychiatrist. Willette Cluster denies the patient having psych issues at this time, and states that pt has medication and it does not need to be refilled. Willette Cluster states that she is concerned for dehydration which is why she sent the pt here. Willette Cluster was informed that pts labs did not indicate that pt was dehydrated. Willette Cluster stated that she would accept pt back but if the patient refused to get out of bed and eat breakfast in the morning that she was going to call EMS again for pt to come back to the hospital.

## 2022-03-28 ENCOUNTER — Emergency Department: Payer: Medicare Other

## 2022-03-28 ENCOUNTER — Emergency Department
Admission: EM | Admit: 2022-03-28 | Discharge: 2022-03-28 | Disposition: A | Discharge status: Home / Self Care | Payer: Medicare Other | Attending: Emergency Medicine | Admitting: Emergency Medicine

## 2022-03-28 ENCOUNTER — Encounter: Payer: Self-pay | Admitting: Emergency Medicine

## 2022-03-28 DIAGNOSIS — S0181XA Laceration without foreign body of other part of head, initial encounter: Secondary | ICD-10-CM | POA: Diagnosis not present

## 2022-03-28 DIAGNOSIS — W01198A Fall on same level from slipping, tripping and stumbling with subsequent striking against other object, initial encounter: Secondary | ICD-10-CM | POA: Insufficient documentation

## 2022-03-28 DIAGNOSIS — R42 Dizziness and giddiness: Secondary | ICD-10-CM | POA: Diagnosis present

## 2022-03-28 DIAGNOSIS — W19XXXA Unspecified fall, initial encounter: Secondary | ICD-10-CM

## 2022-03-28 NOTE — ED Notes (Signed)
Retirement home was called. They will be here in about 20 minutes to pick up the patient.

## 2022-03-28 NOTE — ED Triage Notes (Signed)
Pt presents via POV with complaints of facial laceration on the right side of her face following a fall tonight. Pt states that she stood up to go the restroom and lost her balance and hit her head on the wall. Not on thinners, no LOC. Denies dizziness, CP, or SOB.

## 2022-03-28 NOTE — ED Provider Triage Note (Signed)
Emergency Medicine Provider Triage Evaluation Note  Selena Pittman , a 71 y.o. female  was evaluated in triage.  Pt complains of fall and forehead laceration.  Reports some "months and months" of chronic abdominal pain without any changes.  Reports getting up to void this morning when she got up too quickly and accidentally fell forward and struck her forehead on a nearby wall.  Denies any syncope.  She called out and her husband found her on the ground without signs of syncope or seizure activity.  They saw the blood on her right brow and brought her to the ED.  She reports feeling fine and he acknowledges that she seems at baseline..  Review of Systems  Positive:  Negative:   Physical Exam  Ht '5\' 4"'$  (1.626 m)   Wt 55.3 kg   BMI 20.93 kg/m  Gen:   Awake, no distress   Resp:  Normal effort  MSK:   Moves extremities without difficulty  Other:    Medical Decision Making  Medically screening exam initiated at 3:53 AM.  Appropriate orders placed.  Selena Pittman was informed that the remainder of the evaluation will be completed by another provider, this initial triage assessment does not replace that evaluation, and the importance of remaining in the ED until their evaluation is complete.  Twelve-lead EKG and CT head to begin   Selena Crofts, MD 03/28/22 [phone]

## 2022-03-28 NOTE — ED Notes (Signed)
Discharged by other provider. Facility came and picked up pt.

## 2022-03-28 NOTE — ED Notes (Signed)
Facility contacted again in regards to transportation for this patient. Was informed that the patient was on the way home. I informed the caregiver that this was indeed not the fact and that the patient was still waiting for transportation home in the ED. Was again informed that the caregivers husband had "left 20 minutes ago" but that they would call them and see what was going on.

## 2022-03-28 NOTE — Discharge Instructions (Addendum)
Patient had reassuring CT head and CT cervical spine, laceration closed with skin glue

## 2022-03-28 NOTE — ED Provider Notes (Signed)
[facility] Provider Note    Event Date/Time   First MD Initiated Contact with Patient 03/28/22 [phone]     (approximate)   History   Fall   HPI  Selena Pittman is a 71 y.o. female with a history of schizophrenia who presents after a fall.  Patient reports he subsequently got lightheaded and fell and hit her head.  Not on blood thinners.  No other injuries reported.     Physical Exam   Triage Vital Signs: ED Triage Vitals  Enc Vitals Group     BP 03/28/22 0348 110/74     Pulse Rate 03/28/22 0348 (!) 106     Resp 03/28/22 0348 16     Temp 03/28/22 0348 97.9 F (36.6 C)     Temp Source 03/28/22 0348 Oral     SpO2 03/28/22 0348 96 %     Weight 03/28/22 0344 55.3 kg (121 lb 14.6 oz)     Height 03/28/22 0344 1.626 m ('5\' 4"'$ )     Head Circumference --      Peak Flow --      Pain Score 03/28/22 0826 0     Pain Loc --      Pain Edu? --      Excl. in [place]? --     Most recent vital signs: Vitals:   03/28/22 0348  BP: 110/74  Pulse: (!) 106  Resp: 16  Temp: 97.9 F (36.6 C)  SpO2: 96%     General: Awake, no distress.  CV:  Good peripheral perfusion.  Resp:  Normal effort.  Abd:  No distention.  Other:  Laceration above the right eye approximately 3 cm, moves all extremities well, no pain with axial load on both legs/hips.  No vertebral tenderness palpation.  No abdominal tenderness to palpation.  No chest wall tenderness palpation.   ED Results / Procedures / Treatments   Labs (all labs ordered are listed, but only abnormal results are displayed) Labs Reviewed - No data to display   EKG  ED ECG REPORT I, Lavonia Drafts, the attending physician, personally viewed and interpreted this ECG.  Date: 03/28/2022  Rhythm: normal sinus rhythm QRS Axis: normal Intervals: normal ST/T Wave abnormalities: normal Narrative Interpretation: no evidence of acute ischemia    RADIOLOGY CT head viewed interpret by me, no  ICH    PROCEDURES:  Critical Care performed:   Marland KitchenMarland KitchenLaceration Repair  Date/Time: 03/28/2022 8:31 AM  Performed by: Lavonia Drafts, MD Authorized by: Lavonia Drafts, MD   Consent:    Consent obtained:  Verbal   Risks discussed:  Pain and infection Anesthesia:    Anesthesia method:  None Laceration details:    Location:  Face   Face location:  R eyebrow   Length (cm):  3 Pre-procedure details:    Preparation:  Imaging obtained to evaluate for foreign bodies Treatment:    Amount of cleaning:  Standard   Irrigation solution:  Sterile water Skin repair:    Repair method:  Tissue adhesive Approximation:    Approximation:  Close Repair type:    Repair type:  Simple Post-procedure details:    Dressing:  Open (no dressing)   Procedure completion:  Tolerated    MEDICATIONS ORDERED IN ED: Medications - No data to display   IMPRESSION / MDM / [place] / ED COURSE  I reviewed the triage vital signs and the nursing notes. Patient's presentation is most consistent with acute presentation with potential threat to life  or bodily function.  Patient presents after a fall as described above with head injury.  Not on blood thinners.  Laceration above the right eye.  CT imaging is reassuring, no evidence of ICH.  Neuro and physical exam is reassuring.  Laceration repaired        FINAL CLINICAL IMPRESSION(S) / ED DIAGNOSES   Final diagnoses:  Fall, initial encounter  Facial laceration, initial encounter     Rx / DC Orders   ED Discharge Orders     None        Note:  This document was prepared using Dragon voice recognition software and may include unintentional dictation errors.   Lavonia Drafts, MD 03/28/22 [phone]

## 2022-03-31 ENCOUNTER — Ambulatory Visit: Payer: Medicare Other | Admitting: Family Medicine

## 2022-04-02 ENCOUNTER — Inpatient Hospital Stay
Admission: EM | Admit: 2022-04-02 | Discharge: 2022-04-12 | DRG: 871 | Disposition: A | Discharge status: Nursing Home (Includes ICF) | Payer: Medicare Other | Source: Skilled Nursing Facility | Attending: Internal Medicine | Admitting: Internal Medicine

## 2022-04-02 ENCOUNTER — Emergency Department: Payer: Medicare Other

## 2022-04-02 ENCOUNTER — Other Ambulatory Visit: Payer: Self-pay

## 2022-04-02 DIAGNOSIS — F17210 Nicotine dependence, cigarettes, uncomplicated: Secondary | ICD-10-CM | POA: Diagnosis present

## 2022-04-02 DIAGNOSIS — R531 Weakness: Secondary | ICD-10-CM | POA: Diagnosis not present

## 2022-04-02 DIAGNOSIS — E8809 Other disorders of plasma-protein metabolism, not elsewhere classified: Secondary | ICD-10-CM | POA: Diagnosis present

## 2022-04-02 DIAGNOSIS — R918 Other nonspecific abnormal finding of lung field: Secondary | ICD-10-CM | POA: Diagnosis not present

## 2022-04-02 DIAGNOSIS — L821 Other seborrheic keratosis: Secondary | ICD-10-CM | POA: Diagnosis present

## 2022-04-02 DIAGNOSIS — J850 Gangrene and necrosis of lung: Secondary | ICD-10-CM | POA: Diagnosis present

## 2022-04-02 DIAGNOSIS — Z720 Tobacco use: Secondary | ICD-10-CM | POA: Diagnosis not present

## 2022-04-02 DIAGNOSIS — Z9012 Acquired absence of left breast and nipple: Secondary | ICD-10-CM

## 2022-04-02 DIAGNOSIS — Z79811 Long term (current) use of aromatase inhibitors: Secondary | ICD-10-CM

## 2022-04-02 DIAGNOSIS — E559 Vitamin D deficiency, unspecified: Secondary | ICD-10-CM | POA: Diagnosis present

## 2022-04-02 DIAGNOSIS — Z170 Estrogen receptor positive status [ER+]: Secondary | ICD-10-CM | POA: Diagnosis not present

## 2022-04-02 DIAGNOSIS — J851 Abscess of lung with pneumonia: Secondary | ICD-10-CM | POA: Diagnosis present

## 2022-04-02 DIAGNOSIS — J439 Emphysema, unspecified: Secondary | ICD-10-CM | POA: Diagnosis present

## 2022-04-02 DIAGNOSIS — Z853 Personal history of malignant neoplasm of breast: Secondary | ICD-10-CM

## 2022-04-02 DIAGNOSIS — B961 Klebsiella pneumoniae [K. pneumoniae] as the cause of diseases classified elsewhere: Secondary | ICD-10-CM | POA: Diagnosis present

## 2022-04-02 DIAGNOSIS — E785 Hyperlipidemia, unspecified: Secondary | ICD-10-CM | POA: Diagnosis present

## 2022-04-02 DIAGNOSIS — R6520 Severe sepsis without septic shock: Secondary | ICD-10-CM

## 2022-04-02 DIAGNOSIS — F209 Schizophrenia, unspecified: Secondary | ICD-10-CM | POA: Diagnosis not present

## 2022-04-02 DIAGNOSIS — Z7983 Long term (current) use of bisphosphonates: Secondary | ICD-10-CM

## 2022-04-02 DIAGNOSIS — Z90710 Acquired absence of both cervix and uterus: Secondary | ICD-10-CM | POA: Diagnosis not present

## 2022-04-02 DIAGNOSIS — Z8049 Family history of malignant neoplasm of other genital organs: Secondary | ICD-10-CM | POA: Diagnosis not present

## 2022-04-02 DIAGNOSIS — D649 Anemia, unspecified: Secondary | ICD-10-CM | POA: Diagnosis present

## 2022-04-02 DIAGNOSIS — M810 Age-related osteoporosis without current pathological fracture: Secondary | ICD-10-CM | POA: Diagnosis present

## 2022-04-02 DIAGNOSIS — A419 Sepsis, unspecified organism: Secondary | ICD-10-CM

## 2022-04-02 DIAGNOSIS — D72829 Elevated white blood cell count, unspecified: Secondary | ICD-10-CM | POA: Insufficient documentation

## 2022-04-02 DIAGNOSIS — K529 Noninfective gastroenteritis and colitis, unspecified: Secondary | ICD-10-CM | POA: Diagnosis present

## 2022-04-02 DIAGNOSIS — C50912 Malignant neoplasm of unspecified site of left female breast: Secondary | ICD-10-CM | POA: Diagnosis present

## 2022-04-02 DIAGNOSIS — Z1152 Encounter for screening for COVID-19: Secondary | ICD-10-CM

## 2022-04-02 DIAGNOSIS — F259 Schizoaffective disorder, unspecified: Secondary | ICD-10-CM | POA: Diagnosis present

## 2022-04-02 DIAGNOSIS — E876 Hypokalemia: Secondary | ICD-10-CM | POA: Diagnosis present

## 2022-04-02 DIAGNOSIS — Z8249 Family history of ischemic heart disease and other diseases of the circulatory system: Secondary | ICD-10-CM | POA: Diagnosis not present

## 2022-04-02 DIAGNOSIS — K5900 Constipation, unspecified: Secondary | ICD-10-CM | POA: Diagnosis present

## 2022-04-02 DIAGNOSIS — N3000 Acute cystitis without hematuria: Secondary | ICD-10-CM | POA: Diagnosis present

## 2022-04-02 DIAGNOSIS — D75838 Other thrombocytosis: Secondary | ICD-10-CM | POA: Diagnosis present

## 2022-04-02 DIAGNOSIS — K219 Gastro-esophageal reflux disease without esophagitis: Secondary | ICD-10-CM | POA: Diagnosis present

## 2022-04-02 HISTORY — DX: Sepsis, unspecified organism: A41.9

## 2022-04-02 HISTORY — DX: Severe sepsis without septic shock: R65.20

## 2022-04-02 LAB — RESPIRATORY PANEL BY PCR

## 2022-04-02 LAB — COMPREHENSIVE METABOLIC PANEL
ALT: 40 U/L (ref 0–44)
AST: 43 U/L — ABNORMAL HIGH (ref 15–41)
Albumin: 2.6 g/dL — ABNORMAL LOW (ref 3.5–5.0)
Alkaline Phosphatase: 88 U/L (ref 38–126)
Anion gap: 8 (ref 5–15)
BUN: 17 mg/dL (ref 8–23)
CO2: 27 mmol/L (ref 22–32)
Calcium: 7.8 mg/dL — ABNORMAL LOW (ref 8.9–10.3)
Chloride: 104 mmol/L (ref 98–111)
Creatinine, Ser: 0.8 mg/dL (ref 0.44–1.00)
GFR, Estimated: >60 mL/min (ref >60)
Glucose, Bld: 108 mg/dL — ABNORMAL HIGH (ref 70–99)
Potassium: 2.4 mmol/L — CL (ref 3.5–5.1)
Sodium: 139 mmol/L (ref 135–145)
Total Bilirubin: 0.6 mg/dL (ref 0.3–1.2)
Total Protein: 6.6 g/dL (ref 6.5–8.1)

## 2022-04-02 LAB — CBC WITH DIFFERENTIAL/PLATELET
Abs Immature Granulocytes: 0.13 10*3/uL — ABNORMAL HIGH (ref 0.00–0.07)
Basophils Absolute: 0 10*3/uL (ref 0.0–0.1)
Basophils Relative: 0 %
Eosinophils Absolute: 0 10*3/uL (ref 0.0–0.5)
Eosinophils Relative: 0 %
HCT: 34.8 % — ABNORMAL LOW (ref 36.0–46.0)
Hemoglobin: 11.3 g/dL — ABNORMAL LOW (ref 12.0–15.0)
Immature Granulocytes: 1 %
Lymphocytes Relative: 4 %
Lymphs Abs: 0.6 10*3/uL — ABNORMAL LOW (ref 0.7–4.0)
MCH: 29.4 pg (ref 26.0–34.0)
MCHC: 32.5 g/dL (ref 30.0–36.0)
MCV: 90.4 fL (ref 80.0–100.0)
Monocytes Absolute: 1 10*3/uL (ref 0.1–1.0)
Monocytes Relative: 6 %
Neutro Abs: 15.1 10*3/uL — ABNORMAL HIGH (ref 1.7–7.7)
Neutrophils Relative %: 89 %
Platelets: 473 10*3/uL — ABNORMAL HIGH (ref 150–400)
RBC: 3.85 MIL/uL — ABNORMAL LOW (ref 3.87–5.11)
RDW: 12.8 % (ref 11.5–15.5)
WBC: 17 10*3/uL — ABNORMAL HIGH (ref 4.0–10.5)
nRBC: 0 % (ref 0.0–0.2)

## 2022-04-02 LAB — PROCALCITONIN
Procalcitonin: <0.1 ng/mL
Procalcitonin: 0.1 ng/mL

## 2022-04-02 LAB — URINALYSIS, ROUTINE W REFLEX MICROSCOPIC
Bilirubin Urine: NEGATIVE
Glucose, UA: NEGATIVE mg/dL
Hgb urine dipstick: NEGATIVE
Ketones, ur: 5 mg/dL — AB
Nitrite: NEGATIVE
Protein, ur: 100 mg/dL — AB
Specific Gravity, Urine: 1.032 — ABNORMAL HIGH (ref 1.005–1.030)
WBC, UA: >50 WBC/hpf — ABNORMAL HIGH (ref 0–5)
pH: 5 (ref 5.0–8.0)

## 2022-04-02 LAB — MRSA NEXT GEN BY PCR, NASAL: MRSA by PCR Next Gen: DETECTED — AB

## 2022-04-02 LAB — TROPONIN I (HIGH SENSITIVITY)
Troponin I (High Sensitivity): 6 ng/L (ref <18)
Troponin I (High Sensitivity): 5 ng/L (ref <18)

## 2022-04-02 LAB — MAGNESIUM: Magnesium: 2.1 mg/dL (ref 1.7–2.4)

## 2022-04-02 LAB — HIV ANTIBODY (ROUTINE TESTING W REFLEX): HIV Screen 4th Generation wRfx: NONREACTIVE

## 2022-04-02 LAB — C-REACTIVE PROTEIN: CRP: 22.4 mg/dL — ABNORMAL HIGH (ref <1.0)

## 2022-04-02 LAB — RESP PANEL BY RT-PCR (RSV, FLU A&B, COVID)  RVPGX2
Influenza A by PCR: NEGATIVE
Influenza B by PCR: NEGATIVE
Resp Syncytial Virus by PCR: NEGATIVE
SARS Coronavirus 2 by RT PCR: NEGATIVE

## 2022-04-02 LAB — POTASSIUM: Potassium: 3.1 mmol/L — ABNORMAL LOW (ref 3.5–5.1)

## 2022-04-02 MED ORDER — ACETAMINOPHEN 325 MG PO TABS: 650.0000 mg | ORAL_TABLET | Freq: Four times a day (QID) | ORAL | Status: AC | PRN

## 2022-04-02 MED ORDER — SIMVASTATIN 20 MG PO TABS
20.0000 mg | ORAL_TABLET | Freq: Every day | ORAL | Status: DC
  Administered 2022-04-03 – 2022-04-12 (×10): 20 mg via ORAL
  Filled 2022-04-02 (×4): qty 1
  Filled 2022-04-02: qty 2
  Filled 2022-04-02 (×6): qty 1

## 2022-04-02 MED ORDER — POTASSIUM CHLORIDE CRYS ER 20 MEQ PO TBCR
40.0000 meq | EXTENDED_RELEASE_TABLET | Freq: Once | ORAL | Status: AC
  Administered 2022-04-02: 40 meq via ORAL
  Filled 2022-04-02: qty 2

## 2022-04-02 MED ORDER — CLOZAPINE 100 MG PO TABS
400.0000 mg | ORAL_TABLET | Freq: Every day | ORAL | Status: DC
  Administered 2022-04-02 – 2022-04-11 (×10): 400 mg via ORAL
  Filled 2022-04-02 (×11): qty 4

## 2022-04-02 MED ORDER — LETROZOLE 2.5 MG PO TABS
2.5000 mg | ORAL_TABLET | Freq: Every day | ORAL | Status: DC
  Administered 2022-04-03 – 2022-04-12 (×10): 2.5 mg via ORAL
  Filled 2022-04-02 (×11): qty 1

## 2022-04-02 MED ORDER — ONDANSETRON HCL 4 MG PO TABS: 4.0000 mg | ORAL_TABLET | Freq: Four times a day (QID) | ORAL | Status: AC | PRN

## 2022-04-02 MED ORDER — POTASSIUM CHLORIDE CRYS ER 20 MEQ PO TBCR: 40.0000 meq | EXTENDED_RELEASE_TABLET | Freq: Once | ORAL | Status: DC

## 2022-04-02 MED ORDER — LACTATED RINGERS IV BOLUS
1000.0000 mL | Freq: Once | INTRAVENOUS | Status: AC
  Administered 2022-04-02: 1000 mL via INTRAVENOUS

## 2022-04-02 MED ORDER — MAGNESIUM SULFATE 2 GM/50ML IV SOLN
2.0000 g | Freq: Once | INTRAVENOUS | Status: AC
  Administered 2022-04-02: 2 g via INTRAVENOUS
  Filled 2022-04-02: qty 50

## 2022-04-02 MED ORDER — HYDROXYZINE HCL 25 MG PO TABS
25.0000 mg | ORAL_TABLET | Freq: Two times a day (BID) | ORAL | Status: DC
  Administered 2022-04-03 – 2022-04-12 (×19): 25 mg via ORAL
  Filled 2022-04-02: qty 1
  Filled 2022-04-02: qty 3
  Filled 2022-04-02 (×2): qty 1
  Filled 2022-04-02 (×6): qty 3
  Filled 2022-04-02: qty 1
  Filled 2022-04-02 (×10): qty 3

## 2022-04-02 MED ORDER — SODIUM CHLORIDE 0.9 % IV SOLN: 3.0000 g | Freq: Once | INTRAVENOUS | Status: DC

## 2022-04-02 MED ORDER — ACETAMINOPHEN 650 MG RE SUPP: 650.0000 mg | Freq: Four times a day (QID) | RECTAL | Status: AC | PRN

## 2022-04-02 MED ORDER — MONTELUKAST SODIUM 5 MG PO CHEW
5.0000 mg | CHEWABLE_TABLET | Freq: Every day | ORAL | Status: DC
  Filled 2022-04-02 (×2): qty 1

## 2022-04-02 MED ORDER — PANTOPRAZOLE SODIUM 40 MG PO TBEC
40.0000 mg | DELAYED_RELEASE_TABLET | Freq: Every day | ORAL | Status: DC
  Administered 2022-04-03 – 2022-04-12 (×10): 40 mg via ORAL
  Filled 2022-04-02 (×11): qty 1

## 2022-04-02 MED ORDER — PIPERACILLIN-TAZOBACTAM 3.375 G IVPB
3.3750 g | Freq: Three times a day (TID) | INTRAVENOUS | Status: AC
  Administered 2022-04-02 – 2022-04-03 (×4): 3.375 g via INTRAVENOUS
  Filled 2022-04-02 (×4): qty 50

## 2022-04-02 MED ORDER — SODIUM CHLORIDE 0.9 % IV SOLN: 2.0000 g | Freq: Once | INTRAVENOUS | Status: DC

## 2022-04-02 MED ORDER — VANCOMYCIN HCL IN DEXTROSE 1-5 GM/200ML-% IV SOLN: 1000.0000 mg | INTRAVENOUS | Status: DC

## 2022-04-02 MED ORDER — SODIUM CHLORIDE 0.9 % IV SOLN: 2.0000 g | Freq: Two times a day (BID) | INTRAVENOUS | Status: DC

## 2022-04-02 MED ORDER — IOHEXOL 300 MG/ML  SOLN
75.0000 mL | Freq: Once | INTRAMUSCULAR | Status: AC | PRN
  Administered 2022-04-02: 75 mL via INTRAVENOUS

## 2022-04-02 MED ORDER — VITAMIN D 25 MCG (1000 UNIT) PO TABS
2000.0000 [IU] | ORAL_TABLET | Freq: Every day | ORAL | Status: DC
  Administered 2022-04-03 – 2022-04-12 (×10): 2000 [IU] via ORAL
  Filled 2022-04-02 (×10): qty 2

## 2022-04-02 MED ORDER — NICOTINE 21 MG/24HR TD PT24
21.0000 mg | MEDICATED_PATCH | Freq: Every day | TRANSDERMAL | Status: DC | PRN
  Administered 2022-04-07: 21 mg via TRANSDERMAL
  Filled 2022-04-02: qty 1

## 2022-04-02 MED ORDER — POTASSIUM CHLORIDE 10 MEQ/100ML IV SOLN
10.0000 meq | INTRAVENOUS | Status: DC
  Administered 2022-04-02 (×3): 10 meq via INTRAVENOUS
  Filled 2022-04-02 (×3): qty 100

## 2022-04-02 MED ORDER — SENNOSIDES-DOCUSATE SODIUM 8.6-50 MG PO TABS: 1.0000 | ORAL_TABLET | Freq: Every evening | ORAL | Status: DC | PRN

## 2022-04-02 MED ORDER — VANCOMYCIN HCL IN DEXTROSE 1-5 GM/200ML-% IV SOLN
1000.0000 mg | Freq: Once | INTRAVENOUS | Status: AC
  Administered 2022-04-02: 1000 mg via INTRAVENOUS
  Filled 2022-04-02: qty 200

## 2022-04-02 MED ORDER — ONDANSETRON HCL 4 MG/2ML IJ SOLN
4.0000 mg | Freq: Four times a day (QID) | INTRAMUSCULAR | Status: AC | PRN
  Administered 2022-04-05 – 2022-04-06 (×2): 4 mg via INTRAVENOUS
  Filled 2022-04-02 (×2): qty 2

## 2022-04-02 MED ORDER — LINACLOTIDE 145 MCG PO CAPS
145.0000 ug | ORAL_CAPSULE | Freq: Every day | ORAL | Status: DC
  Administered 2022-04-03 – 2022-04-12 (×10): 145 ug via ORAL
  Filled 2022-04-02 (×10): qty 1

## 2022-04-02 NOTE — Consult Note (Signed)
PULMONOLOGY         Date: 04/02/2022,   MRN# [id number] Selena Pittman 1950-05-21     AdmissionWeight: 55.3 kg                 CurrentWeight: 55.3 kg  Referring provider: Dr. Tobie Poet   CHIEF COMPLAINT:   Lung mass versus abscess   HISTORY OF PRESENT ILLNESS   This is a pleasant 71 year old female with a history of bipolar disorder, history of breast cancer, major depressive disorder, COPD with emphysema, dyslipidemia, osteoporosis, schizophrenia, thyroid nodules, vitamin D deficiency who came in due to undue fatigue and disequilibrium.  She apparently does have some confusion at baseline per family.  She did have a mechanical fall but denies having loss of consciousness or any injury.  She reports being too weak to be ambulatory and has been on bedrest for the last few days.  She does report having cough denies vomiting abdominal pain.  While in the ER she did have vitals done which were essentially normal except for tachypnea in the upper 20s.  She does have SpO2 of over 95% on room air.  She had urinalysis performed in the ER with a large amount of leukocyte Estrace and many bacteria consistent with possible UTI.  Her COVID-19 testing is negative.  CBC shows leukocytosis with a white count of 17,000, chronic anemia with a hemoglobin of 11 with a baseline around 15 and reactive thrombocytosis suggestive of ongoing bleed versus IDA.  CMP was performed with hypokalemia, hypoalbuminemia as well as transaminitis with increased AST. CT chest was performed which was reviewed by me independently with findings of right upper lobe anterior mass which is new from previous study which was abdominal CT scan performed in August with absence of this mass.  PAST MEDICAL HISTORY   Past Medical History:  Diagnosis Date   Bipolar affective ([place])    Breast cancer ([place])    Cancer ([place]) 12-11-14   INVASIVE MAMMARY CARCINOMA/.left breast/ T2 N0   Cardiomegaly    Depression    Emphysema of  lung ([place])    Endometriosis    Hyperlipidemia    Osteoporosis    Schizoaffective disorder ([place])    Schizophrenia ([facility])    Thyroid nodule    Vitamin D deficiency      SURGICAL HISTORY   Past Surgical History:  Procedure Laterality Date   ABDOMINAL HYSTERECTOMY     BREAST BIOPSY Left 12-11-14   INVASIVE MAMMARY CARCINOMA.   BREAST LUMPECTOMY WITH SENTINEL LYMPH NODE BIOPSY Left 12/31/2014   Procedure: LEFT BREAST LUMPECTOMY WITH ULTRASOUND GUIDED NEEDLE LOCALIZATION, SENTINEL LYMPH NODE BIOPSY ;  Surgeon: Christene Lye, MD;  Location: [facility] ORS;  Service: General;  Laterality: Left;   DIAGNOSTIC MAMMOGRAM  12/04/2014   Done at [facility] Category 5-Left Breast   DILATION AND CURETTAGE OF UTERUS     MASTECTOMY Left 2016   SIMPLE MASTECTOMY WITH AXILLARY SENTINEL NODE BIOPSY Left 01/14/2015   Procedure: SIMPLE MASTECTOMY;  Surgeon: Christene Lye, MD;  Location: [facility] ORS;  Service: General;  Laterality: Left;   TENDON REPAIR Right    hand   TONSILLECTOMY     TUBAL LIGATION       FAMILY HISTORY   Family History  Problem Relation Age of Onset   Cancer Mother        uterine   Heart attack Father    Breast cancer Neg Hx      SOCIAL HISTORY   Social  History   Tobacco Use   Smoking status: Every Day    Packs/day: 0.20    Years: 45.00    Total pack years: 9.00    Types: Cigarettes   Smokeless tobacco: Never  Vaping Use   Vaping Use: Never used  Substance Use Topics   Alcohol use: No    Alcohol/week: 0.0 standard drinks of alcohol   Drug use: No     MEDICATIONS    Home Medication:  Current Outpatient Rx   Order #: [id number] Class: Historical Med   Order #: [id number] Class: Historical Med   Order #: [id number] Class: Historical Med   Order #: [id number] Class: Historical Med   Order #: [id number] Class: Normal   Order #: [id number] Class: Historical Med   Order #: [id number] Class: Historical Med   Order #: [id number] Class: Historical Med   Order #:  [id number] Class: Normal   Order #: [id number] Class: Normal   Order #: [id number] Class: Historical Med   Order #: [id number] Class: Normal   Order #: [id number] Class: Historical Med   Order #: [id number] Class: Historical Med   Order #: [id number] Class: Historical Med    Current Medication:  Current Facility-Administered Medications:    acetaminophen (TYLENOL) tablet 650 mg, 650 mg, Oral, Q6H PRN **OR** acetaminophen (TYLENOL) suppository 650 mg, 650 mg, Rectal, Q6H PRN, Cox, Amy N, DO   Ampicillin-Sulbactam (UNASYN) 3 g in sodium chloride 0.9 % 100 mL IVPB, 3 g, Intravenous, Once, Cox, Amy N, DO   magnesium sulfate IVPB 2 g 50 mL, 2 g, Intravenous, Once, Blake Divine, MD, Last Rate: 50 mL/hr at 04/02/22 1438, 2 g at 04/02/22 1438   ondansetron (ZOFRAN) tablet 4 mg, 4 mg, Oral, Q6H PRN **OR** ondansetron (ZOFRAN) injection 4 mg, 4 mg, Intravenous, Q6H PRN, Cox, Amy N, DO   potassium chloride 10 mEq in 100 mL IVPB, 10 mEq, Intravenous, Q1 Hr x 4, Cox, Amy N, DO, Last Rate: 100 mL/hr at 04/02/22 1438, 10 mEq at 04/02/22 1438   potassium chloride SA (KLOR-CON M) CR tablet 40 mEq, 40 mEq, Oral, Once, Blake Divine, MD   senna-docusate (Senokot-S) tablet 1 tablet, 1 tablet, Oral, QHS PRN, Cox, Amy N, DO   vancomycin (VANCOCIN) IVPB 1000 mg/200 mL premix, 1,000 mg, Intravenous, Once, Cox, Amy N, DO  Current Outpatient Medications:    alendronate (FOSAMAX) 70 MG tablet, Take 70 mg by mouth once a week. Take with a full glass of water on an empty stomach., Disp: , Rfl:    cetirizine (ZYRTEC) 10 MG tablet, Take 10 mg by mouth daily., Disp: , Rfl:    cholecalciferol (VITAMIN D) 1000 UNITS tablet, Take 2,000 Units by mouth daily., Disp: , Rfl:    cloZAPine (CLOZARIL) 100 MG tablet, Take 400 mg by mouth daily., Disp: , Rfl:    coal tar-salicylic acid 2 % shampoo, Use to wash scalp Tuesday, Thursday and Saturdays., Disp: 120 mL, Rfl: 5   hydrOXYzine (ATARAX/VISTARIL) 25 MG tablet, Take 25 mg by mouth 3  (three) times daily as needed., Disp: , Rfl:    hydrOXYzine (VISTARIL) 25 MG capsule, Take 25 mg by mouth 2 (two) times daily., Disp: , Rfl:    ketoconazole (NIZORAL) 2 % cream, Apply 1 application topically 2 (two) times daily., Disp: , Rfl:    ketoconazole (NIZORAL) 2 % shampoo, APPLY TO AFFECTED AREA TWICE A WEEK FOR 8 WEEKS; THEN USE AS NEEDED., Disp: 120 mL, Rfl: 1   letrozole (FEMARA) 2.5 MG tablet, Take 1 tablet (2.5 mg total) by mouth daily., Disp: 90 tablet, Rfl:  1   LINZESS 290 MCG CAPS capsule, Take 290 mcg by mouth daily., Disp: , Rfl:    mometasone (ELOCON) 0.1 % lotion, Apply to scalp at bedtime Monday - Fridays, Disp: 60 mL, Rfl: 3   nystatin-triamcinolone (MYCOLOG II) cream, Apply 1 application topically 2 (two) times daily. , Disp: , Rfl: 2   omeprazole (PRILOSEC) 20 MG capsule, Take 20 mg by mouth daily., Disp: , Rfl:    simvastatin (ZOCOR) 20 MG tablet, Take 20 mg by mouth daily., Disp: , Rfl:     ALLERGIES   Cat hair extract, Milk-related compounds, Mixed ragweed, and Peanut-containing drug products     REVIEW OF SYSTEMS    Review of Systems:  Gen:  Denies  fever, sweats, chills weigh loss  HEENT: Denies blurred vision, double vision, ear pain, eye pain, hearing loss, nose bleeds, sore throat Cardiac:  No dizziness, chest pain or heaviness, chest tightness,edema Resp:   reports dyspnea chronically  Gi: Denies swallowing difficulty, stomach pain, nausea or vomiting, diarrhea, constipation, bowel incontinence Gu:  Denies bladder incontinence, burning urine Ext:   Denies Joint pain, stiffness or swelling Skin: Denies  skin rash, easy bruising or bleeding or hives Endoc:  Denies polyuria, polydipsia , polyphagia or weight change Psych:   Denies depression, insomnia or hallucinations   Other:  All other systems negative   VS: BP 110/75   Pulse 94   Temp 98.5 F (36.9 C) (Oral)   Resp (!) 22   Ht '5\' 4"'$  (1.626 m)   Wt 55.3 kg   SpO2 100%   BMI 20.93 kg/m       PHYSICAL EXAM    GENERAL:NAD, no fevers, chills, no weakness no fatigue HEAD: Normocephalic, atraumatic.  EYES: Pupils equal, round, reactive to light. Extraocular muscles intact. No scleral icterus.  MOUTH: Moist mucosal membrane. Dentition intact. No abscess noted.  EAR, NOSE, THROAT: Clear without exudates. No external lesions.  NECK: Supple. No thyromegaly. No nodules. No JVD.  PULMONARY: decreased breath sounds with mild rhonchi worse at bases bilaterally.  CARDIOVASCULAR: S1 and S2. Regular rate and rhythm. No murmurs, rubs, or gallops. No edema. Pedal pulses 2+ bilaterally.  GASTROINTESTINAL: Soft, nontender, nondistended. No masses. Positive bowel sounds. No hepatosplenomegaly.  MUSCULOSKELETAL: No swelling, clubbing, or edema. Range of motion full in all extremities.  NEUROLOGIC: Cranial nerves II through XII are intact. No gross focal neurological deficits. Sensation intact. Reflexes intact.  SKIN: No ulceration, lesions, rashes, or cyanosis. Skin warm and dry. Turgor intact.  PSYCHIATRIC: Mood, affect within normal limits. The patient is awake, alert and oriented x 3. Insight, judgment intact.       IMAGING     ASSESSMENT/PLAN   Large anterior right lung mass -Appears to be new since August  -Patient does have moderate pretest probability for lung cancer -Due to elevated WBC count would empirically treat for possible abscess -Patient does have urinalysis suggestive of UTI and may have leukocytosis related to that as well -Currently the patient is on Unasyn and vancomycin there has been consultation placed for infectious disease-input is appreciated. -Will order respiratory culture -Procalcitonin trend -MRSA PCR has been done would recommend de-escalation of vancomycin if negative -Blood cultures x 2 have been ordered -Aspiratory viral panel should be also done -Additional etiologies include fungus and aspergilloma, will perform Aspergillus antibodies via  serology -Possible histoplasma exposure will perform galactomannan testing as well as urine histoplasma antigen -Due to mental illness and COPD will also obtain cryptococcal antigen -Sputum cytology for  possible malignancy -Likely will need PET scan on outpatient basis -Due to altered mental status may consider MRI brain to rule out metastatic lesions   Thank you for allowing me to participate in the care of this patient.   Patient/Family are satisfied with care plan and all questions have been answered.    Provider disclosure: Patient with at least one acute or chronic illness or injury that poses a threat to life or bodily function and is being managed actively during this encounter.  All of the below services have been performed independently by signing provider:  review of prior documentation from internal and or external health records.  Review of previous and current lab results.  Interview and comprehensive assessment during patient visit today. Review of current and previous chest radiographs/CT scans. Discussion of management and test interpretation with health care team and patient/family.   This document was prepared using Dragon voice recognition software and may include unintentional dictation errors.     Ottie Glazier, M.D.  Division of Pulmonary & Critical Care Medicine

## 2022-04-02 NOTE — ED Notes (Signed)
Verbal report given to Alyssa, RN.

## 2022-04-02 NOTE — Assessment & Plan Note (Addendum)
-   Well circumscribed heterogeneous mass anteromedially in the right hemothorax with central low density suspicious for necrosis versus pulmonary abscess or other atypical infection - Pulmonologist and infectious disease specialist were consulted - Cefepime and vancomycin per pharmacy - Check Legionella antigen, procalcitonin, 20 pathogen respiratory panel - Check MRSA PCR, it is positive, we will continue with vancomycin - Admit to inpatient, progressive cardiac

## 2022-04-02 NOTE — Hospital Course (Addendum)
Selena Pittman is a 71 year old female with history of bipolar disorder,, hyperlipidemia, schizophrenia, to hospital with generalized weakness and ambulatory dysfunction.  In the ED patient had stable vitals.  Laboratory data showed potassium of 2.4.  WBC was elevated at 17.0.  COVID and influenza RSV PCR was negative.  Magnesium was 2.1.  Chest x-ray showed large possibly pleural-based mass projecting over the right perihilar region.  CT scan of the chest was recommended which showed a well-circumscribed homogeneous mass anteromedially in the right hemithorax with central low-density suspicious for necrosis.  Pulmonary was notified.  In the ED, patient received 1 L of IV fluid bolus, magnesium sulfate 2 g, potassium and was admitted to the hospital for further evaluation and treatment.    Assessment and plan.   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  Unable to rule out pulmonary abscess/atypical infection.  Pulmonary and infectious disease been consulted.  On cefepime and vancomycin.  Blood cultures negative in 12 hours.  Respiratory viral panel was negative.  COVID and influenza negative as well.  Pending Legionella Aspergillus, cryptococcal, histoplasma tests.  Procalcitonin was 0.1.  MRSA PCR positive.  HIV was nonreactive.  CRP elevated at 22.4.   Sepsis - Patient met sepsis criteria with elevated respiration rate, heart rate, leukocytosis of 17, possible source  pulmonary abscess Continue vancomycin and cefepime.  Follow pulmonary recommendations  Hypokalemia Improved at this time.  Potassium of 4.2 today.   Leukocytosis WBC at 13.6.  Blood cultures negative so far.  Urinalysis abnormal.  Nitrate negative.   Schizophrenia ([place]) Continue clozapine.   GERD (gastroesophageal reflux disease) Continue Protonix.   Tobacco use On nicotine patch.    Malignant neoplasm of left female breast Continue letrozole from home.

## 2022-04-02 NOTE — ED Triage Notes (Signed)
Pt to ED via [facility]. Family stated pt had new onset immobility, generalized weakness. HR 106, RR 25, BP 106/70.

## 2022-04-02 NOTE — Assessment & Plan Note (Signed)
-   Repeat CBC in a.m. - Blood cultures x 2 have been ordered and pending collection

## 2022-04-02 NOTE — Assessment & Plan Note (Signed)
-   Patient meets sepsis criteria with elevated respiration rate, heart rate, leukocytosis of 17, possible source is pulmonary abscess - Treat per above

## 2022-04-02 NOTE — Assessment & Plan Note (Signed)
-   As needed nicotine patch ordered ?

## 2022-04-02 NOTE — Assessment & Plan Note (Signed)
PPI ?

## 2022-04-02 NOTE — Assessment & Plan Note (Addendum)
-   Status post potassium chloride 10 mill equivalent, every hour, 4 doses ordered by EDP - I have ordered potassium chloride 40 mEq p.o. one-time dose - Check magnesium level daily - Schedule potassium recheck at 1800 on day of admission - Repeat BMP in the a.m.

## 2022-04-02 NOTE — H&P (Addendum)
History and Physical   Selena Pittman XBD:[id number] DOB: Oct 12, 1950 DOA: 04/02/2022  PCP: Remi Haggard, FNP  Patient coming from: [facility]  I have personally briefly reviewed patient's old medical records in [place].  Chief Concern: Generalized weakness  HPI: Selena Pittman is a 71 year old female with history of bipolar, hyperlipidemia, schizophrenia, who presents emergency department for chief concerns of generalized weakness and new onset immobility.  Initial vitals in the emergency department showed temperature of 98.5, respiration rate 20, heart rate of 99, blood pressure 115/79, SpO2 of 97% on room air.  Serum sodium is 139, potassium 2.4, chloride 104, bicarb 27, BUN of 17, serum creatinine of 0.80.  Nonfasting blood glucose is 108.  eGFR greater than 60.  WBC was 17, hemoglobin 11.3, platelets of 473.  COVID/influenza A/influenza B/RSV PCR were negative.  Magnesium level was 2.1 on admission.  ED treatment: LR 1 L bolus, magnesium 2 g IV, potassium chloride 10 mill equivalent x 4, Augmentin 3 g IV.  At bedside patient was able to tell me her name, her age, she knows she is in the hospital.  Patient has a flat affect.  She reports she is in the hospital because she was feeling sick.  She endorses abdominal pain.  She denies cough, shortness of breath, chest pain.  She endorses loss of appetite.  She endorses chronic diarrhea.  She does not know how long she has lost her appetite.  She reports no changes to her diarrhea.  Social history: Patient is from [facility].  She endorses current tobacco use, smoking half pack per day.  She denies EtOH and recreational drug use.  ROS: Constitutional: no weight change, no fever ENT/Mouth: no sore throat, no rhinorrhea Eyes: no eye pain, no vision changes Cardiovascular: no chest pain, no dyspnea,  no edema, no palpitations Respiratory: no cough, no sputum, no wheezing Gastrointestinal: no nausea,  no vomiting, no diarrhea, no constipation Genitourinary: no urinary incontinence, no dysuria, no hematuria Musculoskeletal: no arthralgias, no myalgias Skin: no skin lesions, no pruritus, Neuro: + weakness, no loss of consciousness, no syncope Psych: no anxiety, no depression, + decrease appetite Heme/Lymph: no bruising, no bleeding  ED Course: Discussed with emergency medicine provider, patient requiring hospitalization for chief concerns of large thoracic mass, concerning for pulmonary abscess.  Assessment/Plan  Principal Problem:   Pulmonary parenchymal mass Active Problems:   Malignant neoplasm of left female breast (HCC)   Sepsis (HCC)   Tobacco use   GERD (gastroesophageal reflux disease)   Schizophrenia (HCC)   Leukocytosis   Hypokalemia   Assessment and Plan:  * Pulmonary parenchymal mass - Well circumscribed heterogeneous mass anteromedially in the right hemothorax with central low density suspicious for necrosis versus pulmonary abscess or other atypical infection - Pulmonologist and infectious disease specialist were consulted - Cefepime and vancomycin per pharmacy - Check Legionella antigen, procalcitonin, 20 pathogen respiratory panel - Check MRSA PCR, it is positive, we will continue with vancomycin - Admit to inpatient, progressive cardiac  Hypokalemia - Status post potassium chloride 10 mill equivalent, every hour, 4 doses ordered by EDP - I have ordered potassium chloride 40 mEq p.o. one-time dose - Check magnesium level daily - Schedule potassium recheck at 1800 on day of admission - Repeat BMP in the a.m.  Leukocytosis - Repeat CBC in a.m. - Blood cultures x 2 have been ordered and pending collection  Schizophrenia ([place]) - Resumed home clozapine 400 mg daily  GERD (gastroesophageal reflux disease) - PPI  Tobacco use - As needed nicotine patch ordered  Sepsis ([place]) - Patient meets sepsis criteria with elevated respiration rate, heart rate,  leukocytosis of 17, possible source is pulmonary abscess - Treat per above  Malignant neoplasm of left female breast (HCC) - Resumed home letrozole 2.5 mg daily  Chart reviewed.   DVT prophylaxis: TED hose; pharmacologic DVT prophylaxis not initiated on admission.  Patient may need thoracentesis versus bronchoscopy and pending pulmonology evaluation.  AM team to initiate pharmacologic DVT prophylaxis when the benefits outweigh the risk. Code Status: Full code Diet: Heart healthy Family Communication: No Disposition Plan: Pending clinical course Consults called: Neurology, infectious disease Admission status: Progressive cardiac, inpatient  Past Medical History:  Diagnosis Date   Bipolar affective ([place])    Breast cancer ([place])    Cancer ([place]) 12-11-14   INVASIVE MAMMARY CARCINOMA/.left breast/ T2 N0   Cardiomegaly    Depression    Emphysema of lung ([place])    Endometriosis    Hyperlipidemia    Osteoporosis    Schizoaffective disorder ([place])    Schizophrenia ([place])    Thyroid nodule    Vitamin D deficiency    Past Surgical History:  Procedure Laterality Date   ABDOMINAL HYSTERECTOMY     BREAST BIOPSY Left 12-11-14   INVASIVE MAMMARY CARCINOMA.   BREAST LUMPECTOMY WITH SENTINEL LYMPH NODE BIOPSY Left 12/31/2014   Procedure: LEFT BREAST LUMPECTOMY WITH ULTRASOUND GUIDED NEEDLE LOCALIZATION, SENTINEL LYMPH NODE BIOPSY ;  Surgeon: Christene Lye, MD;  Location: [facility] ORS;  Service: General;  Laterality: Left;   DIAGNOSTIC MAMMOGRAM  12/04/2014   Done at [facility] Category 5-Left Breast   DILATION AND CURETTAGE OF UTERUS     MASTECTOMY Left 2016   SIMPLE MASTECTOMY WITH AXILLARY SENTINEL NODE BIOPSY Left 01/14/2015   Procedure: SIMPLE MASTECTOMY;  Surgeon: Christene Lye, MD;  Location: [facility] ORS;  Service: General;  Laterality: Left;   TENDON REPAIR Right    hand   TONSILLECTOMY     TUBAL LIGATION     Social History:  reports that she has been smoking cigarettes. She  has a 9.00 pack-year smoking history. She has never used smokeless tobacco. She reports that she does not drink alcohol and does not use drugs.  Allergies  Allergen Reactions   Cat Hair Extract    Milk-Related Compounds    Mixed Ragweed    Peanut-Containing Drug Products    Family History  Problem Relation Age of Onset   Cancer Mother        uterine   Heart attack Father    Breast cancer Neg Hx    Family history: Family history reviewed and not pertinent  Prior to Admission medications   Medication Sig Start Date End Date Taking? Authorizing Provider  alendronate (FOSAMAX) 70 MG tablet Take 70 mg by mouth once a week. Take with a full glass of water on an empty stomach.    [provider]  cetirizine (ZYRTEC) 10 MG tablet Take 10 mg by mouth daily.    [provider]  cholecalciferol (VITAMIN D) 1000 UNITS tablet Take 2,000 Units by mouth daily.    [provider]  cloZAPine (CLOZARIL) 100 MG tablet Take 400 mg by mouth daily.    [provider]  coal tar-salicylic acid 2 % shampoo Use to wash scalp Tuesday, Thursday and Saturdays. 12/11/19   Ralene Bathe, MD  hydrOXYzine (ATARAX/VISTARIL) 25 MG tablet Take 25 mg by mouth 3 (three) times daily as needed.  [provider]  hydrOXYzine (VISTARIL) 25 MG capsule Take 25 mg by mouth 2 (two) times daily. 07/06/18   [provider]  ketoconazole (NIZORAL) 2 % cream Apply 1 application topically 2 (two) times daily. 07/13/18   [provider]  ketoconazole (NIZORAL) 2 % shampoo APPLY TO AFFECTED AREA TWICE A WEEK FOR 8 WEEKS; THEN USE AS NEEDED. 09/10/20   Ralene Bathe, MD  letrozole [facility]) 2.5 MG tablet Take 1 tablet (2.5 mg total) by mouth daily. 08/07/15   Forest Gleason, MD  LINZESS 290 MCG CAPS capsule Take 290 mcg by mouth daily. 07/06/18   [provider]  mometasone (ELOCON) 0.1 % lotion Apply to scalp at bedtime Monday - Fridays 02/20/20   Ralene Bathe,  MD  nystatin-triamcinolone [facility]) cream Apply 1 application topically 2 (two) times daily.  11/19/17   [provider]  omeprazole (PRILOSEC) 20 MG capsule Take 20 mg by mouth daily.    [provider]  simvastatin (ZOCOR) 20 MG tablet Take 20 mg by mouth daily.    [provider]   Physical Exam: Vitals:   04/02/22 1430 04/02/22 1730 04/02/22 1745 04/02/22 1800  BP: 110/75 105/67  109/76  Pulse: 94 93  93  Resp: (!) 22 (!) 23  (!) 25  Temp:   99.8 F (37.7 C)   TempSrc:   Oral   SpO2: 100% 98%  100%  Weight:      Height:       Constitutional: appears frail, older than chronological age, cachectic, NAD, calm, comfortable Eyes: PERRL, lids and conjunctivae normal ENMT: Mucous membranes are moist. Posterior pharynx clear of any exudate or lesions. Age-appropriate dentition. Hearing appropriate Neck: normal, supple, no masses, no thyromegaly Respiratory:no wheezing, no crackles. Normal respiratory effort. No accessory muscle use.  Decreased lung sounds on the right middle lobe. Cardiovascular: Regular rate and rhythm, no murmurs / rubs / gallops. No extremity edema. 2+ pedal pulses. No carotid bruits.  Abdomen: no tenderness, no masses palpated, no hepatosplenomegaly. Bowel sounds positive.  Musculoskeletal: no clubbing / cyanosis. No joint deformity upper and lower extremities. Good ROM, no contractures, no atrophy. Normal muscle tone.  Skin: no rashes, lesions, ulcers. No induration Neurologic: Sensation intact. Strength 5/5 in all 4.  Psychiatric: Lacks judgment and insight. Alert and oriented x 3.  Flat affect.  Depressed mood.   EKG: independently reviewed, showing sinus tachycardia with rate of 100, QTc 483.  Chest x-ray on Admission: I personally reviewed and I agree with radiologist reading as below.  CT Chest W Contrast  Result Date: 04/02/2022 CLINICAL DATA:  Abnormal x-ray. Right chest mass. * Tracking Code: BO * EXAM: CT CHEST WITH CONTRAST  TECHNIQUE: Multidetector CT imaging of the chest was performed during intravenous contrast administration. RADIATION DOSE REDUCTION: This exam was performed according to the departmental dose-optimization program which includes automated exposure control, adjustment of the mA and/or kV according to patient size and/or use of iterative reconstruction technique. CONTRAST:  16m OMNIPAQUE IOHEXOL 300 MG/ML  SOLN COMPARISON:  Chest radiographs 04/02/2022 and 12/03/2021. Abdominal CT 12/11/2021. FINDINGS: Cardiovascular: No acute vascular findings. Diffuse coronary artery atherosclerosis with lesser involvement of the aorta and great vessels. The heart size is normal. There is a small amount of pericardial fluid. Mediastinum/Nodes: There are no enlarged mediastinal, hilar or axillary lymph nodes. The thyroid gland, trachea and esophagus demonstrate no significant findings. Lungs/Pleura: No pleural effusion or pneumothorax. As seen on earlier radiographs, there is a well-circumscribed mass  anteromedially in the right hemithorax which measures approximately 7.7 x 5.4 x 8.2 cm. This abuts the anterior chest wall and anterior mediastinum and has components within both the upper and middle lobes. This mass is heterogeneous with central low-density suspicious for necrosis. There is a peripheral irregular thick wall. There is no air within this collection. Minimal surrounding airspace disease which may reflect atelectasis. There is additional mild dependent atelectasis in both lung bases. Mild underlying centrilobular and paraseptal emphysema. No other suspicious pulmonary nodules. Upper abdomen: No significant findings are demonstrated within the visualized upper abdomen. Musculoskeletal/Chest wall: There is no chest wall mass or suspicious osseous finding. No chest wall involvement by the mass is demonstrated. Thoracolumbar spondylosis noted. IMPRESSION: 1. Well-circumscribed heterogeneous mass anteromedially in the right  hemithorax with central low-density suspicious for necrosis. By imaging, this is highly suspicious for malignancy. However, this mass is not apparent on the portable chest x-ray or abdominal CT done less than 4 months ago such that pulmonary abscess or other atypical infection should be considered in this patient presenting with a cough. Management options include short-term CT follow-up after appropriate antibiotic therapy and tissue sampling to assess for infection and malignancy. 2. No evidence of metastatic disease. 3. No pleural effusion or pneumothorax. 4. Diffuse coronary artery atherosclerosis. Aortic Atherosclerosis (ICD10-I70.0) and Emphysema (ICD10-J43.9). Electronically Signed   By: Richardean Sale M.D.   On: 04/02/2022 14:31   CT Head Wo Contrast  Result Date: 04/02/2022 CLINICAL DATA:  Head trauma, minor (Age >= 65y); Neck trauma (Age >= 65y). Generalized weakness EXAM: CT HEAD WITHOUT CONTRAST CT CERVICAL SPINE WITHOUT CONTRAST TECHNIQUE: Multidetector CT imaging of the head and cervical spine was performed following the standard protocol without intravenous contrast. Multiplanar CT image reconstructions of the cervical spine were also generated. RADIATION DOSE REDUCTION: This exam was performed according to the departmental dose-optimization program which includes automated exposure control, adjustment of the mA and/or kV according to patient size and/or use of iterative reconstruction technique. COMPARISON:  03/28/2022 FINDINGS: CT HEAD FINDINGS Brain: No evidence of acute infarction, hemorrhage, hydrocephalus, extra-axial collection or mass lesion/mass effect. Moderate low-density changes within the periventricular and subcortical white matter compatible with chronic microvascular ischemic change. Mild diffuse cerebral volume loss. Vascular: No hyperdense vessel or unexpected calcification. Skull: Normal. Negative for fracture or focal lesion. Sinuses/Orbits: No acute finding. Other: Negative  for scalp hematoma. CT CERVICAL SPINE FINDINGS Alignment: Facet joints are aligned without dislocation or traumatic listhesis. Dens and lateral masses are aligned. Unchanged reversal of the cervical lordosis. Skull base and vertebrae: No acute fracture. No primary bone lesion or focal pathologic process. Soft tissues and spinal canal: No prevertebral fluid or swelling. No visible canal hematoma. Disc levels: Advanced multilevel degenerative disc disease, unchanged. Upper chest: Included lung apices are clear. Other: None. IMPRESSION: 1. No acute intracranial abnormality. 2. No acute fracture or subluxation of the cervical spine. 3. Chronic microvascular ischemic change and cerebral volume loss. 4. 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 Electronically Signed   By: Davina Poke D.O.   On: 04/02/2022 13:29   CT Cervical Spine Wo Contrast  Result Date: 04/02/2022 CLINICAL DATA:  Head trauma, minor (Age >= 65y); Neck trauma (Age >= 65y). Generalized weakness EXAM: CT HEAD WITHOUT CONTRAST CT CERVICAL SPINE WITHOUT CONTRAST TECHNIQUE: Multidetector CT imaging of the head and cervical spine was performed following the standard protocol without intravenous contrast. Multiplanar CT image reconstructions of the cervical spine were also generated. RADIATION DOSE REDUCTION:  This exam was performed according to the departmental dose-optimization program which includes automated exposure control, adjustment of the mA and/or kV according to patient size and/or use of iterative reconstruction technique. COMPARISON:  03/28/2022 FINDINGS: CT HEAD FINDINGS Brain: No evidence of acute infarction, hemorrhage, hydrocephalus, extra-axial collection or mass lesion/mass effect. Moderate low-density changes within the periventricular and subcortical white matter compatible with chronic microvascular ischemic change. Mild diffuse cerebral volume loss. Vascular: No hyperdense vessel or  unexpected calcification. Skull: Normal. Negative for fracture or focal lesion. Sinuses/Orbits: No acute finding. Other: Negative for scalp hematoma. CT CERVICAL SPINE FINDINGS Alignment: Facet joints are aligned without dislocation or traumatic listhesis. Dens and lateral masses are aligned. Unchanged reversal of the cervical lordosis. Skull base and vertebrae: No acute fracture. No primary bone lesion or focal pathologic process. Soft tissues and spinal canal: No prevertebral fluid or swelling. No visible canal hematoma. Disc levels: Advanced multilevel degenerative disc disease, unchanged. Upper chest: Included lung apices are clear. Other: None. IMPRESSION: 1. No acute intracranial abnormality. 2. No acute fracture or subluxation of the cervical spine. 3. Chronic microvascular ischemic change and cerebral volume loss. 4. 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 Electronically Signed   By: Davina Poke D.O.   On: 04/02/2022 13:29   DG Chest 2 View  Result Date: 04/02/2022 CLINICAL DATA:  Weakness and cough. EXAM: CHEST - 2 VIEW COMPARISON:  CXR 12/03/21 FINDINGS: There is a large mass projecting over the right perihilar region that is new compared to 12/03/2021. The heart size is normal. No pleural effusion. No pneumothorax. No displaced rib fractures. Visualized upper abdomen is unremarkable. IMPRESSION: Large, possibly pleural based, mass projecting over the right perihilar region. Recommend further evaluation with chest CT with contrast. This is new compared to 12/03/21. Electronically Signed   By: Marin Roberts M.D.   On: 04/02/2022 13:29    Labs on Admission: I have personally reviewed following labs  CBC: Recent Labs  Lab 04/02/22 1252  WBC 17.0*  NEUTROABS 15.1*  HGB 11.3*  HCT 34.8*  MCV 90.4  PLT 641*   Basic Metabolic Panel: Recent Labs  Lab 04/02/22 1252 04/02/22 1652  NA 139  --   K 2.4* 3.1*  CL 104  --   CO2 27  --   GLUCOSE 108*   --   BUN 17  --   CREATININE 0.80  --   CALCIUM 7.8*  --   MG 2.1  --    GFR: Estimated Creatinine Clearance: 57.3 mL/min (by C-G formula based on SCr of 0.8 mg/dL).  Liver Function Tests: Recent Labs  Lab 04/02/22 1252  AST 43*  ALT 40  ALKPHOS 88  BILITOT 0.6  PROT 6.6  ALBUMIN 2.6*   Anemia Panel: No results for input(s): "VITAMINB12", "FOLATE", "FERRITIN", "TIBC", "IRON", "RETICCTPCT" in the last 72 hours.  Urine analysis:    Component Value Date/Time   COLORURINE AMBER (A) 04/02/2022 1444   APPEARANCEUR CLOUDY (A) 04/02/2022 1444   LABSPEC 1.032 (H) 04/02/2022 1444   PHURINE 5.0 04/02/2022 1444   GLUCOSEU NEGATIVE 04/02/2022 1444   HGBUR NEGATIVE 04/02/2022 1444   BILIRUBINUR NEGATIVE 04/02/2022 1444   KETONESUR 5 (A) 04/02/2022 1444   PROTEINUR 100 (A) 04/02/2022 1444   NITRITE NEGATIVE 04/02/2022 1444   LEUKOCYTESUR LARGE (A) 04/02/2022 1444   This document was prepared using Dragon Voice Recognition software and may include unintentional dictation errors.  Dr. Tobie Poet [facility]  If 7PM-7AM, please contact overnight-coverage provider  If 7AM-7PM, please contact day coverage provider [email or web address]  04/02/2022, 7:28 PM

## 2022-04-02 NOTE — Progress Notes (Signed)
PHARMACIST - PHYSICIAN ORDER COMMUNICATION  Selena Pittman is a 71 y.o. year old female with a history of schizophrenia on Clozapine PTA. Continuing this medication order as an inpatient requires that monitoring parameters per REMS requirements must be met.   Clozapine REMS Dispense Authorization was obtained, and will dispense inpatient.  RDA code [id number].  Verified Clozapine dose: 400 mg po nightly  Last ANC value and date reported on the Clozapine REMS website: 04/02/22 [place] monitoring frequency: once weekly Next ANC reporting is due on (date) 04/09/22.  [name] 04/02/2022, 7:13 PM

## 2022-04-02 NOTE — Progress Notes (Addendum)
Pharmacy Antibiotic Note  Selena Pittman is a 71 y.o. female admitted on 04/02/2022 with sepsis.  Pharmacy has been consulted for vancomycin and Zosyn dosing.  Assessment:  71 y.o. F with h/o schizoaffective disorder, COPD, breast cancer, and endometriosis who presents to the ED complaining of weakness  Tmax 98.5, WBC 17.0, Scr 0.8   Plan: Vancomycin 1000 mg IV Q 24 hrs. Goal AUC 400-550. Expected AUC: 483.3, Cmin 10.7, Cmax 35.2 SCr used: 0.8  Vd 0.72  Zosyn 3.375 gm IV Q8H  Height: '5\' 4"'$  (162.6 cm) Weight: 55.3 kg (121 lb 14.6 oz) IBW/kg (Calculated) : 54.7  Temp (24hrs), Avg:98.5 F (36.9 C), Min:98.5 F (36.9 C), Max:98.5 F (36.9 C)  Recent Labs  Lab 04/02/22 1252  WBC 17.0*  CREATININE 0.80    Estimated Creatinine Clearance: 57.3 mL/min (by C-G formula based on SCr of 0.8 mg/dL).    Allergies  Allergen Reactions   Cat Hair Extract    Milk-Related Compounds    Mixed Ragweed    Peanut-Containing Drug Products     Antimicrobials this admission: Vancomycin 12/14 >>  Cefepime 12/14 >>   Dose adjustments this admission: N/A  Microbiology results: 12/14 BCx: pending 12/14 Sputum: ordered  12/14 MRSA PCR: pending  Thank you for allowing pharmacy to be a part of this patient's care.  Alison Murray 04/02/2022 4:03 PM

## 2022-04-02 NOTE — Assessment & Plan Note (Addendum)
-   Resumed home clozapine 400 mg daily

## 2022-04-02 NOTE — ED Provider Notes (Signed)
[facility] Provider Note    Event Date/Time   First MD Initiated Contact with Patient 04/02/22 1239     (approximate)   History   Chief Complaint Weakness   HPI  Selena Pittman is a 71 y.o. female with past medical history of schizoaffective disorder, COPD, breast cancer, and endometriosis who presents to the ED complaining of weakness.  Per EMS, patient had new onset of immobility with generalized weakness noticed by family earlier today.  She has some baseline confusion per EMS, but family had stated she is at her baseline today.  Patient reports that she fell earlier today, does not remember how she fell and is not sure whether she hit her head or lost consciousness.  She does state that she was unable to get herself up off of the ground and has been unable to walk since she fell.  She was seen in the ED 5 days ago for fall, reports ongoing headache since then but denies any other areas of pain.  She reports recent cough, denies nausea, vomiting, abdominal pain, dysuria, chest pain, or shortness of breath.     Physical Exam   Triage Vital Signs: ED Triage Vitals  Enc Vitals Group     BP      Pulse      Resp      Temp      Temp src      SpO2      Weight      Height      Head Circumference      Peak Flow      Pain Score      Pain Loc      Pain Edu?      Excl. in [place]?     Most recent vital signs: Vitals:   04/02/22 1415 04/02/22 1430  BP:  110/75  Pulse: 94 94  Resp: (!) 22 (!) 22  Temp:    SpO2: 99% 100%    Constitutional: Alert and oriented to person, place, time, and situation. Eyes: Conjunctivae are normal. Head: Laceration above the right eye status post repair, no scalp hematomas or step-offs noted. Nose: No congestion/rhinnorhea. Mouth/Throat: Mucous membranes are moist.  Neck: Midline cervical spine tenderness to palpation noted. Cardiovascular: Normal rate, regular rhythm. Grossly normal heart sounds.  2+ radial pulses  bilaterally. Respiratory: Normal respiratory effort.  No retractions. Lungs CTAB. Gastrointestinal: Soft and nontender. No distention. Musculoskeletal: No lower extremity tenderness nor edema.  No upper extremity bony tenderness to palpation noted. Neurologic:  Normal speech and language.  Global weakness noted with no gross focal neurologic deficits appreciated.    ED Results / Procedures / Treatments   Labs (all labs ordered are listed, but only abnormal results are displayed) Labs Reviewed  CBC WITH DIFFERENTIAL/PLATELET - Abnormal; Notable for the following components:      Result Value   WBC 17.0 (*)    RBC 3.85 (*)    Hemoglobin 11.3 (*)    HCT 34.8 (*)    Platelets 473 (*)    Neutro Abs 15.1 (*)    Lymphs Abs 0.6 (*)    Abs Immature Granulocytes 0.13 (*)    All other components within normal limits  COMPREHENSIVE METABOLIC PANEL - Abnormal; Notable for the following components:   Potassium 2.4 (*)    Glucose, Bld 108 (*)    Calcium 7.8 (*)    Albumin 2.6 (*)    AST 43 (*)    All other  components within normal limits  RESP PANEL BY RT-PCR (RSV, FLU A&B, COVID)  [id number]  MAGNESIUM  URINALYSIS, ROUTINE W REFLEX MICROSCOPIC  PROCALCITONIN  TROPONIN I (HIGH SENSITIVITY)  TROPONIN I (HIGH SENSITIVITY)     EKG  ED ECG REPORT I, Blake Divine, the attending physician, personally viewed and interpreted this ECG.   Date: 04/02/2022  EKG Time: 12:38  Rate: 100  Rhythm: sinus tachycardia  Axis: Normal  Intervals:none  ST&T Change: None  RADIOLOGY Chest x-ray reviewed and interpreted by me with large right-sided mass.  CT head reviewed and interpreted by me with no hemorrhage or midline shift.  PROCEDURES:  Critical Care performed: No  Procedures   MEDICATIONS ORDERED IN ED: Medications  magnesium sulfate IVPB 2 g 50 mL (2 g Intravenous New Bag/Given 04/02/22 1438)  potassium chloride 10 mEq in 100 mL IVPB (10 mEq Intravenous New Bag/Given 04/02/22 1438)   Ampicillin-Sulbactam (UNASYN) 3 g in sodium chloride 0.9 % 100 mL IVPB (has no administration in time range)  vancomycin (VANCOCIN) IVPB 1000 mg/200 mL premix (has no administration in time range)  lactated ringers bolus 1,000 mL (1,000 mLs Intravenous New Bag/Given 04/02/22 1437)  iohexol (OMNIPAQUE) 300 MG/ML solution 75 mL (75 mLs Intravenous Contrast Given 04/02/22 1352)     IMPRESSION / MDM / ASSESSMENT AND PLAN / ED COURSE  I reviewed the triage vital signs and the nursing notes.                              71 y.o. female with past medical history of schizoaffective disorder, COPD, endometriosis, and breast cancer who presents to the ED complaining of generalized weakness and unwitnessed fall earlier today following ED visit for fall 5 days ago.  Patient's presentation is most consistent with acute presentation with potential threat to life or bodily function.  Differential diagnosis includes, but is not limited to, intracranial injury, cervical spine injury, pneumonia, UTI, AKI, electrode abnormality, anemia, stroke, medication effect.  Patient nontoxic-appearing and in no acute distress, vital signs are unremarkable and patient is alert and oriented, no focal neurologic deficits noted.  Patient does have midline cervical spine tenderness on exam and we will check CT head and cervical spine, no evidence of traumatic injury to her trunk or extremities.  We will screen labs and check for potential infectious process with chest x-ray and urinalysis.  Plan to hydrate with IV fluids and reassess following labs and imaging.  CT head and cervical spine are negative for traumatic injury or other acute process.  Labs remarkable for leukocytosis and mild anemia, along with significant hypokalemia.  Magnesium level added on and within normal limits, will replete potassium via IV and oral route.  Chest x-ray does show large right-sided lung mass, which was further assessed with CT imaging.   Findings concerning for malignancy versus abscess, will add on procalcitonin but would favor infectious process given acute change from prior imaging.  Additionally, when patient was asked whether she had been vomiting, she states "only in my mouth."  We will start her on Vanco and Unasyn, case discussed with hospitalist for admission.      FINAL CLINICAL IMPRESSION(S) / ED DIAGNOSES   Final diagnoses:  Lung mass  Generalized weakness     Rx / DC Orders   ED Discharge Orders     None        Note:  This document was prepared using Dragon voice recognition software  and may include unintentional dictation errors.   Blake Divine, MD 04/02/22 [phone]

## 2022-04-02 NOTE — Assessment & Plan Note (Signed)
-   Resumed home letrozole 2.5 mg daily

## 2022-04-02 NOTE — Consult Note (Signed)
CODE SEPSIS - PHARMACY COMMUNICATION  **Broad Spectrum Antibiotics should be administered within 1 hour of Sepsis diagnosis**  Time Code Sepsis Called/Page Received: 1507  Antibiotics Ordered: vancomycin, zosyn  Time of 1st antibiotic administration: 5784  Additional action taken by pharmacy: N/A    Alison Murray ,PharmD Clinical Pharmacist  04/02/2022  4:14 PM

## 2022-04-03 ENCOUNTER — Encounter: Payer: Self-pay | Admitting: Internal Medicine

## 2022-04-03 ENCOUNTER — Inpatient Hospital Stay: Payer: Medicare Other

## 2022-04-03 DIAGNOSIS — K219 Gastro-esophageal reflux disease without esophagitis: Secondary | ICD-10-CM | POA: Diagnosis not present

## 2022-04-03 DIAGNOSIS — A419 Sepsis, unspecified organism: Secondary | ICD-10-CM | POA: Diagnosis not present

## 2022-04-03 DIAGNOSIS — R531 Weakness: Secondary | ICD-10-CM | POA: Diagnosis not present

## 2022-04-03 DIAGNOSIS — C50912 Malignant neoplasm of unspecified site of left female breast: Secondary | ICD-10-CM

## 2022-04-03 DIAGNOSIS — R918 Other nonspecific abnormal finding of lung field: Secondary | ICD-10-CM | POA: Diagnosis not present

## 2022-04-03 DIAGNOSIS — E876 Hypokalemia: Secondary | ICD-10-CM

## 2022-04-03 DIAGNOSIS — Z170 Estrogen receptor positive status [ER+]: Secondary | ICD-10-CM | POA: Diagnosis not present

## 2022-04-03 DIAGNOSIS — D72829 Elevated white blood cell count, unspecified: Secondary | ICD-10-CM | POA: Diagnosis not present

## 2022-04-03 DIAGNOSIS — J850 Gangrene and necrosis of lung: Secondary | ICD-10-CM | POA: Diagnosis not present

## 2022-04-03 DIAGNOSIS — Z720 Tobacco use: Secondary | ICD-10-CM

## 2022-04-03 LAB — CBC
HCT: 32.1 % — ABNORMAL LOW (ref 36.0–46.0)
Hemoglobin: 10.3 g/dL — ABNORMAL LOW (ref 12.0–15.0)
MCH: 29.3 pg (ref 26.0–34.0)
MCHC: 32.1 g/dL (ref 30.0–36.0)
MCV: 91.2 fL (ref 80.0–100.0)
Platelets: 441 10*3/uL — ABNORMAL HIGH (ref 150–400)
RBC: 3.52 MIL/uL — ABNORMAL LOW (ref 3.87–5.11)
RDW: 13 % (ref 11.5–15.5)
WBC: 13.6 10*3/uL — ABNORMAL HIGH (ref 4.0–10.5)
nRBC: 0 % (ref 0.0–0.2)

## 2022-04-03 LAB — BASIC METABOLIC PANEL
Anion gap: 6 (ref 5–15)
BUN: 8 mg/dL (ref 8–23)
CO2: 26 mmol/L (ref 22–32)
Calcium: 7.6 mg/dL — ABNORMAL LOW (ref 8.9–10.3)
Chloride: 106 mmol/L (ref 98–111)
Creatinine, Ser: 0.6 mg/dL (ref 0.44–1.00)
GFR, Estimated: >60 mL/min (ref >60)
Glucose, Bld: 130 mg/dL — ABNORMAL HIGH (ref 70–99)
Potassium: 4.2 mmol/L (ref 3.5–5.1)
Sodium: 138 mmol/L (ref 135–145)

## 2022-04-03 LAB — CORTISOL-AM, BLOOD: Cortisol - AM: 30.1 ug/dL — ABNORMAL HIGH (ref 6.7–22.6)

## 2022-04-03 LAB — PROCALCITONIN: Procalcitonin: <0.1 ng/mL

## 2022-04-03 LAB — MAGNESIUM: Magnesium: 2.3 mg/dL (ref 1.7–2.4)

## 2022-04-03 MED ORDER — POLYETHYLENE GLYCOL 3350 17 G PO PACK
17.0000 g | PACK | Freq: Every day | ORAL | Status: DC
  Administered 2022-04-07 – 2022-04-12 (×5): 17 g via ORAL
  Filled 2022-04-03 (×4): qty 1

## 2022-04-03 MED ORDER — SODIUM CHLORIDE 0.9 % IV SOLN
3.0000 g | Freq: Four times a day (QID) | INTRAVENOUS | Status: AC
  Administered 2022-04-04 – 2022-04-11 (×32): 3 g via INTRAVENOUS
  Filled 2022-04-03: qty 8
  Filled 2022-04-03: qty 3
  Filled 2022-04-03 (×5): qty 8
  Filled 2022-04-03 (×2): qty 3
  Filled 2022-04-03: qty 8
  Filled 2022-04-03 (×2): qty 3
  Filled 2022-04-03 (×2): qty 8
  Filled 2022-04-03: qty 3
  Filled 2022-04-03 (×5): qty 8
  Filled 2022-04-03: qty 3
  Filled 2022-04-03 (×3): qty 8
  Filled 2022-04-03: qty 3
  Filled 2022-04-03 (×7): qty 8

## 2022-04-03 MED ORDER — OXYCODONE HCL 5 MG PO TABS
5.0000 mg | ORAL_TABLET | Freq: Four times a day (QID) | ORAL | Status: DC | PRN
  Administered 2022-04-03 – 2022-04-12 (×5): 5 mg via ORAL
  Filled 2022-04-03 (×5): qty 1

## 2022-04-03 MED ORDER — DOCUSATE SODIUM 100 MG PO CAPS
100.0000 mg | ORAL_CAPSULE | Freq: Two times a day (BID) | ORAL | Status: DC
  Administered 2022-04-05 – 2022-04-12 (×13): 100 mg via ORAL
  Filled 2022-04-03 (×14): qty 1

## 2022-04-03 MED ORDER — MONTELUKAST SODIUM 10 MG PO TABS
5.0000 mg | ORAL_TABLET | Freq: Every day | ORAL | Status: DC
  Administered 2022-04-04 – 2022-04-11 (×9): 5 mg via ORAL
  Filled 2022-04-03 (×8): qty 1
  Filled 2022-04-03: qty 0.5
  Filled 2022-04-03: qty 1

## 2022-04-03 MED ORDER — LINEZOLID 600 MG/300ML IV SOLN
600.0000 mg | Freq: Two times a day (BID) | INTRAVENOUS | Status: DC
  Administered 2022-04-03 – 2022-04-09 (×12): 600 mg via INTRAVENOUS
  Filled 2022-04-03 (×12): qty 300

## 2022-04-03 MED ORDER — IOHEXOL 300 MG/ML  SOLN
75.0000 mL | Freq: Once | INTRAMUSCULAR | Status: AC | PRN
  Administered 2022-04-03: 75 mL via INTRAVENOUS

## 2022-04-03 NOTE — Progress Notes (Signed)
PULMONOLOGY         Date: 04/03/2022,   MRN# [id number] Selena Pittman [birth year]     AdmissionWeight: 55.3 kg                 CurrentWeight: 55.3 kg  Referring provider: Dr. Tobie Poet   CHIEF COMPLAINT:   Lung mass versus abscess   HISTORY OF PRESENT ILLNESS   This is a pleasant 71 year old female with a history of bipolar disorder, history of breast cancer, major depressive disorder, COPD with emphysema, dyslipidemia, osteoporosis, schizophrenia, thyroid nodules, vitamin D deficiency who came in due to undue fatigue and disequilibrium.  She apparently does have some confusion at baseline per family.  She did have a mechanical fall but denies having loss of consciousness or any injury.  She reports being too weak to be ambulatory and has been on bedrest for the last few days.  She does report having cough denies vomiting abdominal pain.  While in the ER she did have vitals done which were essentially normal except for tachypnea in the upper 20s.  She does have SpO2 of over 95% on room air.  She had urinalysis performed in the ER with a large amount of leukocyte Estrace and many bacteria consistent with possible UTI.  Her COVID-19 testing is negative.  CBC shows leukocytosis with a white count of 17,000, chronic anemia with a hemoglobin of 11 with a baseline around 15 and reactive thrombocytosis suggestive of ongoing bleed versus IDA.  CMP was performed with hypokalemia, hypoalbuminemia as well as transaminitis with increased AST. CT chest was performed which was reviewed by me independently with findings of right upper lobe anterior mass which is new from previous study which was abdominal CT scan performed in August with absence of this mass.  PAST MEDICAL HISTORY   Past Medical History:  Diagnosis Date   Bipolar affective ([place])    Breast cancer ([place])    Cancer ([place]) 12-11-14   INVASIVE MAMMARY CARCINOMA/.left breast/ T2 N0   Cardiomegaly    Depression    Emphysema of  lung ([place])    Endometriosis    Hyperlipidemia    Osteoporosis    Schizoaffective disorder ([place])    Schizophrenia ([facility])    Thyroid nodule    Vitamin D deficiency      SURGICAL HISTORY   Past Surgical History:  Procedure Laterality Date   ABDOMINAL HYSTERECTOMY     BREAST BIOPSY Left 12-11-14   INVASIVE MAMMARY CARCINOMA.   BREAST LUMPECTOMY WITH SENTINEL LYMPH NODE BIOPSY Left 12/31/2014   Procedure: LEFT BREAST LUMPECTOMY WITH ULTRASOUND GUIDED NEEDLE LOCALIZATION, SENTINEL LYMPH NODE BIOPSY ;  Surgeon: Christene Lye, MD;  Location: [facility] ORS;  Service: General;  Laterality: Left;   DIAGNOSTIC MAMMOGRAM  12/04/2014   Done at [facility] Category 5-Left Breast   DILATION AND CURETTAGE OF UTERUS     MASTECTOMY Left 2016   SIMPLE MASTECTOMY WITH AXILLARY SENTINEL NODE BIOPSY Left 01/14/2015   Procedure: SIMPLE MASTECTOMY;  Surgeon: Christene Lye, MD;  Location: [facility] ORS;  Service: General;  Laterality: Left;   TENDON REPAIR Right    hand   TONSILLECTOMY     TUBAL LIGATION       FAMILY HISTORY   Family History  Problem Relation Age of Onset   Cancer Mother        uterine   Heart attack Father    Breast cancer Neg Hx      SOCIAL HISTORY   Social  History   Tobacco Use   Smoking status: Every Day    Packs/day: 0.20    Years: 45.00    Total pack years: 9.00    Types: Cigarettes   Smokeless tobacco: Never  Vaping Use   Vaping Use: Never used  Substance Use Topics   Alcohol use: No    Alcohol/week: 0.0 standard drinks of alcohol   Drug use: No     MEDICATIONS    Home Medication:  Current Outpatient Rx   Order #: [id number] Class: Historical Med   Order #: [id number] Class: Historical Med   Order #: [id number] Class: Historical Med   Order #: [id number] Class: Historical Med   Order #: [id number] Class: Historical Med   Order #: [id number] Class: Historical Med   Order #: [id number] Class: Normal   Order #: [id number] Class: Historical Med   Order #:  [id number] Class: Historical Med   Order #: [id number] Class: Historical Med   Order #: [id number] Class: Historical Med   Order #: [id number] Class: Historical Med   Order #: [id number] Class: Normal   Order #: [id number] Class: Historical Med   Order #: [id number] Class: Historical Med   Order #: [id number] Class: Normal   Order #: [id number] Class: Historical Med   Order #: [id number] Class: Normal   Order #: [id number] Class: Historical Med    Current Medication:  Current Facility-Administered Medications:    acetaminophen (TYLENOL) tablet 650 mg, 650 mg, Oral, Q6H PRN **OR** acetaminophen (TYLENOL) suppository 650 mg, 650 mg, Rectal, Q6H PRN, Cox, Amy N, DO   cholecalciferol (VITAMIN D3) 25 MCG (1000 UNIT) tablet 2,000 Units, 2,000 Units, Oral, Daily, Cox, Amy N, DO   cloZAPine (CLOZARIL) tablet 400 mg, 400 mg, Oral, QHS, Cox, Amy N, DO, 400 mg at 04/02/22 2235   hydrOXYzine (ATARAX) tablet 25 mg, 25 mg, Oral, BID, Cox, Amy N, DO   letrozole (FEMARA) tablet 2.5 mg, 2.5 mg, Oral, Daily, Cox, Amy N, DO   linaclotide (LINZESS) capsule 145 mcg, 145 mcg, Oral, QAC breakfast, Cox, Amy N, DO   montelukast (SINGULAIR) chewable tablet 5 mg, 5 mg, Oral, QHS, Cox, Amy N, DO   nicotine (NICODERM CQ - dosed in mg/24 hours) patch 21 mg, 21 mg, Transdermal, Daily PRN, Cox, Amy N, DO   ondansetron (ZOFRAN) tablet 4 mg, 4 mg, Oral, Q6H PRN **OR** ondansetron (ZOFRAN) injection 4 mg, 4 mg, Intravenous, Q6H PRN, Cox, Amy N, DO   pantoprazole (PROTONIX) EC tablet 40 mg, 40 mg, Oral, Daily, Cox, Amy N, DO   piperacillin-tazobactam (ZOSYN) IVPB 3.375 g, 3.375 g, Intravenous, Q8H, Cox, Amy N, DO, Last Rate: 12.5 mL/hr at 04/03/22 0543, 3.375 g at 04/03/22 0543   senna-docusate (Senokot-S) tablet 1 tablet, 1 tablet, Oral, QHS PRN, Cox, Amy N, DO   simvastatin (ZOCOR) tablet 20 mg, 20 mg, Oral, Daily, Cox, Amy N, DO   vancomycin (VANCOCIN) IVPB 1000 mg/200 mL premix, 1,000 mg, Intravenous, Q24H, Cox, Amy N, DO  Current  Outpatient Medications:    alendronate (FOSAMAX) 70 MG tablet, Take 70 mg by mouth once a week. Take with a full glass of water on an empty stomach., Disp: , Rfl:    cetirizine (ZYRTEC) 10 MG tablet, Take 10 mg by mouth daily., Disp: , Rfl:    cholecalciferol (VITAMIN D) 1000 UNITS tablet, Take 2,000 Units by mouth daily., Disp: , Rfl:    cloZAPine (CLOZARIL) 100 MG tablet, Take 400 mg by mouth daily., Disp: , Rfl:    docusate sodium (COLACE) 100 MG capsule, Take 100 mg by mouth 2 (two) times daily., Disp: ,  Rfl:    hydrOXYzine (VISTARIL) 25 MG capsule, Take 25 mg by mouth 2 (two) times daily., Disp: , Rfl:    letrozole (FEMARA) 2.5 MG tablet, Take 1 tablet (2.5 mg total) by mouth daily., Disp: 90 tablet, Rfl: 1   LINZESS 145 MCG CAPS capsule, Take 145 mcg by mouth daily., Disp: , Rfl:    montelukast (SINGULAIR) 5 MG chewable tablet, Chew 5 mg by mouth at bedtime., Disp: , Rfl:    omeprazole (PRILOSEC) 20 MG capsule, Take 20 mg by mouth daily., Disp: , Rfl:    simvastatin (ZOCOR) 20 MG tablet, Take 20 mg by mouth daily., Disp: , Rfl:    calcium carbonate (OSCAL) 1500 (600 Ca) MG TABS tablet, Take 1 tablet by mouth 2 (two) times daily. (Patient not taking: Reported on 04/02/2022), Disp: , Rfl:    coal tar-salicylic acid 2 % shampoo, Use to wash scalp Tuesday, Thursday and Saturdays., Disp: 120 mL, Rfl: 5   hydrOXYzine (ATARAX/VISTARIL) 25 MG tablet, Take 25 mg by mouth 3 (three) times daily as needed. (Patient not taking: Reported on 04/02/2022), Disp: , Rfl:    ketoconazole (NIZORAL) 2 % cream, Apply 1 application topically 2 (two) times daily. (Patient not taking: Reported on 04/02/2022), Disp: , Rfl:    ketoconazole (NIZORAL) 2 % shampoo, APPLY TO AFFECTED AREA TWICE A WEEK FOR 8 WEEKS; THEN USE AS NEEDED., Disp: 120 mL, Rfl: 1   LINZESS 290 MCG CAPS capsule, Take 290 mcg by mouth daily. (Patient not taking: Reported on 04/02/2022), Disp: , Rfl:    mometasone (ELOCON) 0.1 % lotion, Apply to scalp  at bedtime Monday - Fridays (Patient not taking: Reported on 04/02/2022), Disp: 60 mL, Rfl: 3   nystatin-triamcinolone (MYCOLOG II) cream, Apply 1 application topically 2 (two) times daily.  (Patient not taking: Reported on 04/02/2022), Disp: , Rfl: 2    ALLERGIES   Cat hair extract, Milk-related compounds, Mixed ragweed, and Peanut-containing drug products     REVIEW OF SYSTEMS    Review of Systems:  Gen:  Denies  fever, sweats, chills weigh loss  HEENT: Denies blurred vision, double vision, ear pain, eye pain, hearing loss, nose bleeds, sore throat Cardiac:  No dizziness, chest pain or heaviness, chest tightness,edema Resp:   reports dyspnea chronically  Gi: Denies swallowing difficulty, stomach pain, nausea or vomiting, diarrhea, constipation, bowel incontinence Gu:  Denies bladder incontinence, burning urine Ext:   Denies Joint pain, stiffness or swelling Skin: Denies  skin rash, easy bruising or bleeding or hives Endoc:  Denies polyuria, polydipsia , polyphagia or weight change Psych:   Denies depression, insomnia or hallucinations   Other:  All other systems negative   VS: BP 92/61   Pulse 85   Temp 98.3 F (36.8 C) (Axillary)   Resp 20   Ht '5\' 4"'$  (1.626 m)   Wt 55.3 kg   SpO2 98%   BMI 20.93 kg/m      PHYSICAL EXAM    GENERAL:NAD, no fevers, chills, no weakness no fatigue HEAD: Normocephalic, atraumatic.  EYES: Pupils equal, round, reactive to light. Extraocular muscles intact. No scleral icterus.  MOUTH: Moist mucosal membrane. Dentition intact. No abscess noted.  EAR, NOSE, THROAT: Clear without exudates. No external lesions.  NECK: Supple. No thyromegaly. No nodules. No JVD.  PULMONARY: decreased breath sounds with mild rhonchi worse at bases bilaterally.  CARDIOVASCULAR: S1 and S2. Regular rate and rhythm. No murmurs, rubs, or gallops. No edema. Pedal pulses 2+ bilaterally.  GASTROINTESTINAL: Soft, nontender,  nondistended. No masses. Positive bowel  sounds. No hepatosplenomegaly.  MUSCULOSKELETAL: No swelling, clubbing, or edema. Range of motion full in all extremities.  NEUROLOGIC: Cranial nerves II through XII are intact. No gross focal neurological deficits. Sensation intact. Reflexes intact.  SKIN: No ulceration, lesions, rashes, or cyanosis. Skin warm and dry. Turgor intact.  PSYCHIATRIC: Mood, affect within normal limits. The patient is awake, alert and oriented x 3. Insight, judgment intact.       IMAGING     ASSESSMENT/PLAN   Large anterior right lung mass -Appears to be new since August  -Patient does have moderate pretest probability for lung cancer -Due to elevated WBC count would empirically treat for possible abscess -Patient does have urinalysis suggestive of UTI and may have leukocytosis related to that as well -Currently the patient is on Unasyn and vancomycin there has been consultation placed for infectious disease-input is appreciated. -Will order respiratory culture -Procalcitonin trend -MRSA PCR has been done would recommend de-escalation of vancomycin if negative -Blood cultures x 2 have been ordered -Aspiratory viral panel should be also done -Additional etiologies include fungus and aspergilloma, will perform Aspergillus antibodies via serology -Possible histoplasma exposure will perform galactomannan testing as well as urine histoplasma antigen -Due to mental illness and COPD will also obtain cryptococcal antigen -Sputum cytology for possible malignancy -Likely will need PET scan on outpatient basis -Due to altered mental status may consider MRI brain to rule out metastatic lesions   Thank you for allowing me to participate in the care of this patient.   Patient/Family are satisfied with care plan and all questions have been answered.    Provider disclosure: Patient with at least one acute or chronic illness or injury that poses a threat to life or bodily function and is being managed actively  during this encounter.  All of the below services have been performed independently by signing provider:  review of prior documentation from internal and or external health records.  Review of previous and current lab results.  Interview and comprehensive assessment during patient visit today. Review of current and previous chest radiographs/CT scans. Discussion of management and test interpretation with health care team and patient/family.   This document was prepared using Dragon voice recognition software and may include unintentional dictation errors.     Ottie Glazier, M.D.  Division of Pulmonary & Critical Care Medicine

## 2022-04-03 NOTE — ED Notes (Signed)
Delayed on administration of letrozole due to waiting on in to arrive from Main ED

## 2022-04-03 NOTE — ED Notes (Signed)
Delayed in administering the patient's linaclotide due to waiting on it to arrive from the pharmacy

## 2022-04-03 NOTE — ED Notes (Addendum)
Patient okayed for PO medications with small sips of water by Dr

## 2022-04-03 NOTE — Progress Notes (Signed)
Pharmacy Antibiotic Note  Selena Pittman is a 71 y.o. female admitted on 04/02/2022 with sepsis.  Pharmacy has been consulted for vancomycin and Zosyn dosing.  Assessment:  71 y.o. F with h/o schizoaffective disorder, COPD, breast cancer, and endometriosis who presents to the ED complaining of weakness  Tmax 98.5, WBC 17.0 >> 13.6, Scr 0.8 >>0.6 MRSA PCR +   Plan: Continue Vancomycin 1000 mg IV Q 24 hrs (day 2). Goal AUC 400-550. Expected AUC: 483.3, Cmin 10.7, Cmax 35.2 SCr used: 0.8  Vd 0.72  Continue Zosyn 3.375 gm IV Q8H  Height: '5\' 4"'$  (162.6 cm) Weight: 55.3 kg (121 lb 14.6 oz) IBW/kg (Calculated) : 54.7  Temp (24hrs), Avg:98.7 F (37.1 C), Min:98.3 F (36.8 C), Max:99.8 F (37.7 C)  Recent Labs  Lab 04/02/22 1252 04/03/22 0446  WBC 17.0* 13.6*  CREATININE 0.80 0.60     Estimated Creatinine Clearance: 57.3 mL/min (by C-G formula based on SCr of 0.6 mg/dL).    Allergies  Allergen Reactions   Cat Hair Extract    Milk-Related Compounds    Mixed Ragweed    Peanut-Containing Drug Products     Antimicrobials this admission: Vancomycin 12/14 >>  Zosyn 12/14 >>    Dose adjustments this admission: N/A  Microbiology results: 12/14 BCx: pending  12/14 MRSA PCR: positive 12/14 Resp panel: neg for Covid, flu and RSV 12/15 Aspergillus: pending  Thank you for allowing pharmacy to be a part of this patient's care.  Alison Murray 04/03/2022 10:46 AM

## 2022-04-03 NOTE — Plan of Care (Signed)

## 2022-04-03 NOTE — Progress Notes (Signed)
PROGRESS NOTE    Selena Pittman  SHU:[id number] DOB: 02-08-1951 DOA: 04/02/2022 PCP: Remi Haggard, FNP    Brief Narrative:  [name] is a 71 year old female with history of bipolar disorder,, hyperlipidemia, schizophrenia, to hospital with generalized weakness and ambulatory dysfunction.  In the ED patient had stable vitals.  Laboratory data showed potassium of 2.4.  WBC was elevated at 17.0.  COVID and influenza RSV PCR was negative.  Magnesium was 2.1.  Chest x-ray showed large possibly pleural-based mass projecting over the right perihilar region.  CT scan of the chest was recommended which showed a well-circumscribed homogeneous mass anteromedially in the right hemithorax with central low-density suspicious for necrosis. In the ED, patient received 1 L of IV fluid bolus, magnesium sulfate 2 g, potassium and was admitted to the hospital for further evaluation and treatment.  Pulmonary and ID was notified.  Assessment and plan.   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  Unable to rule out pulmonary abscess/atypical infection.  Pulmonary and infectious disease been consulted.  On Zosyn and vancomycin.  Blood cultures negative in 12 hours.  Respiratory viral panel was negative.  COVID and influenza negative as well.  Pending Legionella Aspergillus, cryptococcal, histoplasma tests.  Procalcitonin was 0.1.  MRSA PCR positive.  HIV was nonreactive.  CRP elevated at 22.4.  Pulmonary has recommended a CT-guided biopsy.  Will follow pulmonary recommendation.   Sepsis Patient met sepsis criteria with elevated respiration rate, heart rate, leukocytosis of 17, possible source  pulmonary abscess. Continue vancomycin and Zosyn.  Follow pulmonary recommendations.  ID has been consulted as well.  Hypokalemia Improved at this time.  Potassium of 4.2 today.  Monitor BMP.   Leukocytosis WBC at 13.6.  Blood cultures negative so far.  Urinalysis abnormal.   Nitrate negative.  Check CBC in AM.  Nonspecific abdominal pain constipation patient will work on bowel regimen.   Schizophrenia ([place]) Continue clozapine.   GERD (gastroesophageal reflux disease) Continue Protonix.   Tobacco use On nicotine patch.  Malignant neoplasm of left female breast Continue letrozole from home.     DVT prophylaxis: Place TED hose Start: 04/02/22 1507   Code Status:     Code Status: Full Code  Disposition: Home likely in 2 to 3 days.  Follow ID and pulmonary recommendation.  Status is: Inpatient  Remains inpatient appropriate because: IV antibiotic, lung mass, pulmonary follow-up,   Family Communication: None at bedside  Consultants:  Pulmonary Infectious disease  Procedures:  None so far  Antimicrobials:  Vancomycin and Zosyn 12/15>  Anti-infectives (From admission, onward)    Start     Dose/Rate Route Frequency Ordered Stop   04/03/22 1800  vancomycin (VANCOCIN) IVPB 1000 mg/200 mL premix        1,000 mg 200 mL/hr over 60 Minutes Intravenous Every 24 hours 04/02/22 1617     04/03/22 0500  ceFEPIme (MAXIPIME) 2 g in sodium chloride 0.9 % 100 mL IVPB  Status:  Discontinued        2 g 200 mL/hr over 30 Minutes Intravenous Every 12 hours 04/02/22 1617 04/02/22 1642   04/02/22 1645  piperacillin-tazobactam (ZOSYN) IVPB 3.375 g        3.375 g 12.5 mL/hr over 240 Minutes Intravenous Every 8 hours 04/02/22 1642     04/02/22 1600  ceFEPIme (MAXIPIME) 2 g in sodium chloride 0.9 % 100 mL IVPB  Status:  Discontinued        2 g 200 mL/hr over 30 Minutes  Intravenous  Once 04/02/22 1554 04/02/22 1642   04/02/22 1500  Ampicillin-Sulbactam (UNASYN) 3 g in sodium chloride 0.9 % 100 mL IVPB  Status:  Discontinued        3 g 200 mL/hr over 30 Minutes Intravenous  Once 04/02/22 1452 04/02/22 1609   04/02/22 1500  vancomycin (VANCOCIN) IVPB 1000 mg/200 mL premix        1,000 mg 200 mL/hr over 60 Minutes Intravenous  Once 04/02/22 1452 04/02/22 1857       Subjective: Today, patient was seen and examined at bedside.  Patient states that she does not feel too well.  Complains of abdominal discomfort and has not had a bowel movement in 4 to 5 days.  Denies any nausea or vomiting.  Has mild chest discomfort but denies dyspnea or shortness of breath.  Objective: Vitals:   04/03/22 0200 04/03/22 0536 04/03/22 0615 04/03/22 0836  BP: 99/65 92/61  94/62  Pulse: 89 85  82  Resp: _0 Temp:   98.3 F (36.8 C) 98.3 F (36.8 C)  TempSrc:   Axillary Axillary  SpO2: 97% 98%  97%  Weight:      Height:        Intake/Output Summary (Last 24 hours) at 04/03/2022 1112 Last data filed at 04/03/2022 0921 Gross per 24 hour  Intake 252.95 ml  Output --  Net 252.95 ml   Filed Weights   04/02/22 1245  Weight: 55.3 kg    Physical Examination: Body mass index is 20.93 kg/m.  General: Thinly built built, not in obvious distress, closes her eyes, appears weak and frail. HENT:   No scleral pallor or icterus noted. Oral mucosa is moist.  Chest:    Diminished breath sounds bilaterally.  CVS: S1 &S2 heard. No murmur.  Regular rate and rhythm. Abdomen: Soft, nonspecific tenderness on palpation, nondistended.  Bowel sounds are heard.   Extremities: No cyanosis, clubbing or edema.  Peripheral pulses are palpable. Psych: Alert, awake and oriented, normal mood, poor insight.  Flat affect. CNS:  No cranial nerve deficits.  Power equal in all extremities.   Skin: Warm and dry.  No rashes noted.  Data Reviewed:   CBC: Recent Labs  Lab 04/02/22 1252 04/03/22 0446  WBC 17.0* 13.6*  NEUTROABS 15.1*  --   HGB 11.3* 10.3*  HCT 34.8* 32.1*  MCV 90.4 91.2  PLT 473* 441*    Basic Metabolic Panel: Recent Labs  Lab 04/02/22 1252 04/02/22 1652 04/03/22 0446  NA 139  --  138  K 2.4* 3.1* 4.2  CL 104  --  106  CO2 27  --  26  GLUCOSE 108*  --  130*  BUN 17  --  8  CREATININE 0.80  --  0.60  CALCIUM 7.8*  --  7.6*  MG 2.1  --  2.3     Liver Function Tests: Recent Labs  Lab 04/02/22 1252  AST 43*  ALT 40  ALKPHOS 88  BILITOT 0.6  PROT 6.6  ALBUMIN 2.6*     Radiology Studies: CT Chest W Contrast  Result Date: 04/02/2022 CLINICAL DATA:  Abnormal x-ray. Right chest mass. * Tracking Code: BO * EXAM: CT CHEST WITH CONTRAST TECHNIQUE: Multidetector CT imaging of the chest was performed during intravenous contrast administration. RADIATION DOSE REDUCTION: This exam was performed according to the departmental dose-optimization program which includes automated exposure control, adjustment of the mA and/or kV according to patient size and/or use of iterative reconstruction technique.  CONTRAST:  77m OMNIPAQUE IOHEXOL 300 MG/ML  SOLN COMPARISON:  Chest radiographs 04/02/2022 and 12/03/2021. Abdominal CT 12/11/2021. FINDINGS: Cardiovascular: No acute vascular findings. Diffuse coronary artery atherosclerosis with lesser involvement of the aorta and great vessels. The heart size is normal. There is a small amount of pericardial fluid. Mediastinum/Nodes: There are no enlarged mediastinal, hilar or axillary lymph nodes. The thyroid gland, trachea and esophagus demonstrate no significant findings. Lungs/Pleura: No pleural effusion or pneumothorax. As seen on earlier radiographs, there is a well-circumscribed mass anteromedially in the right hemithorax which measures approximately 7.7 x 5.4 x 8.2 cm. This abuts the anterior chest wall and anterior mediastinum and has components within both the upper and middle lobes. This mass is heterogeneous with central low-density suspicious for necrosis. There is a peripheral irregular thick wall. There is no air within this collection. Minimal surrounding airspace disease which may reflect atelectasis. There is additional mild dependent atelectasis in both lung bases. Mild underlying centrilobular and paraseptal emphysema. No other suspicious pulmonary nodules. Upper abdomen: No significant findings  are demonstrated within the visualized upper abdomen. Musculoskeletal/Chest wall: There is no chest wall mass or suspicious osseous finding. No chest wall involvement by the mass is demonstrated. Thoracolumbar spondylosis noted. IMPRESSION: 1. Well-circumscribed heterogeneous mass anteromedially in the right hemithorax with central low-density suspicious for necrosis. By imaging, this is highly suspicious for malignancy. However, this mass is not apparent on the portable chest x-ray or abdominal CT done less than 4 months ago such that pulmonary abscess or other atypical infection should be considered in this patient presenting with a cough. Management options include short-term CT follow-up after appropriate antibiotic therapy and tissue sampling to assess for infection and malignancy. 2. No evidence of metastatic disease. 3. No pleural effusion or pneumothorax. 4. Diffuse coronary artery atherosclerosis. Aortic Atherosclerosis (ICD10-I70.0) and Emphysema (ICD10-J43.9). Electronically Signed   By: WRichardean SaleM.D.   On: 04/02/2022 14:31   CT Head Wo Contrast  Result Date: 04/02/2022 CLINICAL DATA:  Head trauma, minor (Age >= 65y); Neck trauma (Age >= 65y). Generalized weakness EXAM: CT HEAD WITHOUT CONTRAST CT CERVICAL SPINE WITHOUT CONTRAST TECHNIQUE: Multidetector CT imaging of the head and cervical spine was performed following the standard protocol without intravenous contrast. Multiplanar CT image reconstructions of the cervical spine were also generated. RADIATION DOSE REDUCTION: This exam was performed according to the departmental dose-optimization program which includes automated exposure control, adjustment of the mA and/or kV according to patient size and/or use of iterative reconstruction technique. COMPARISON:  03/28/2022 FINDINGS: CT HEAD FINDINGS Brain: No evidence of acute infarction, hemorrhage, hydrocephalus, extra-axial collection or mass lesion/mass effect. Moderate low-density changes  within the periventricular and subcortical white matter compatible with chronic microvascular ischemic change. Mild diffuse cerebral volume loss. Vascular: No hyperdense vessel or unexpected calcification. Skull: Normal. Negative for fracture or focal lesion. Sinuses/Orbits: No acute finding. Other: Negative for scalp hematoma. CT CERVICAL SPINE FINDINGS Alignment: Facet joints are aligned without dislocation or traumatic listhesis. Dens and lateral masses are aligned. Unchanged reversal of the cervical lordosis. Skull base and vertebrae: No acute fracture. No primary bone lesion or focal pathologic process. Soft tissues and spinal canal: No prevertebral fluid or swelling. No visible canal hematoma. Disc levels: Advanced multilevel degenerative disc disease, unchanged. Upper chest: Included lung apices are clear. Other: None. IMPRESSION: 1. No acute intracranial abnormality. 2. No acute fracture or subluxation of the cervical spine. 3. Chronic microvascular ischemic change and cerebral volume loss. 4. Advanced multilevel degenerative disc disease of  the cervical spine, unchanged. Electronically Signed   By: Davina Poke D.O.   On: 04/02/2022 13:29   CT Cervical Spine Wo Contrast  Result Date: 04/02/2022 CLINICAL DATA:  Head trauma, minor (Age >= 65y); Neck trauma (Age >= 65y). Generalized weakness EXAM: CT HEAD WITHOUT CONTRAST CT CERVICAL SPINE WITHOUT CONTRAST TECHNIQUE: Multidetector CT imaging of the head and cervical spine was performed following the standard protocol without intravenous contrast. Multiplanar CT image reconstructions of the cervical spine were also generated. RADIATION DOSE REDUCTION: This exam was performed according to the departmental dose-optimization program which includes automated exposure control, adjustment of the mA and/or kV according to patient size and/or use of iterative reconstruction technique. COMPARISON:  03/28/2022 FINDINGS: CT HEAD FINDINGS Brain: No evidence of  acute infarction, hemorrhage, hydrocephalus, extra-axial collection or mass lesion/mass effect. Moderate low-density changes within the periventricular and subcortical white matter compatible with chronic microvascular ischemic change. Mild diffuse cerebral volume loss. Vascular: No hyperdense vessel or unexpected calcification. Skull: Normal. Negative for fracture or focal lesion. Sinuses/Orbits: No acute finding. Other: Negative for scalp hematoma. CT CERVICAL SPINE FINDINGS Alignment: Facet joints are aligned without dislocation or traumatic listhesis. Dens and lateral masses are aligned. Unchanged reversal of the cervical lordosis. Skull base and vertebrae: No acute fracture. No primary bone lesion or focal pathologic process. Soft tissues and spinal canal: No prevertebral fluid or swelling. No visible canal hematoma. Disc levels: Advanced multilevel degenerative disc disease, unchanged. Upper chest: Included lung apices are clear. Other: None. IMPRESSION: 1. No acute intracranial abnormality. 2. No acute fracture or subluxation of the cervical spine. 3. Chronic microvascular ischemic change and cerebral volume loss. 4. 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 Electronically Signed   By: Davina Poke D.O.   On: 04/02/2022 13:29   DG Chest 2 View  Result Date: 04/02/2022 CLINICAL DATA:  Weakness and cough. EXAM: CHEST - 2 VIEW COMPARISON:  CXR 12/03/21 FINDINGS: There is a large mass projecting over the right perihilar region that is new compared to 12/03/2021. The heart size is normal. No pleural effusion. No pneumothorax. No displaced rib fractures. Visualized upper abdomen is unremarkable. IMPRESSION: Large, possibly pleural based, mass projecting over the right perihilar region. Recommend further evaluation with chest CT with contrast. This is new compared to 12/03/21. Electronically Signed   By: Marin Roberts M.D.   On: 04/02/2022 13:29      LOS: 1 day    Flora Lipps, MD [facility] Available via Epic secure chat 7am-7pm After these hours, please refer to coverage provider listed on [email or web address] 04/03/2022, 11:12 AM

## 2022-04-03 NOTE — ED Notes (Signed)
Pt brought to ED rm 37 at this time, this RN now assuming care.

## 2022-04-03 NOTE — Plan of Care (Signed)
  Problem: Elimination: Goal: Will not experience complications related to bowel motility Outcome: Progressing Goal: Will not experience complications related to urinary retention Outcome: Progressing   Problem: Nutrition: Goal: Adequate nutrition will be maintained Outcome: Progressing   Problem: Coping: Goal: Level of anxiety will decrease Outcome: Progressing

## 2022-04-03 NOTE — ED Notes (Signed)
Delay of administration of polyethylene glycol until after the patient's procedure

## 2022-04-03 NOTE — Consult Note (Signed)
NAME: Selena Pittman  DOB: 13-Jul-1950  MRN: [id number]  Date/Time: 04/03/2022 10:37 AM  REQUESTING PROVIDER: Dr. Tobie Poet Subjective:  REASON FOR CONSULT: necrotizing pneumonia ? Selena Pittman is a 70 y.o. female with a history of Bipolar disorder, breast ca s.p simple mastectomy left , now on letrazole, presents to the ED on 04/02/22 with weakness, pain abdomen Pt gives some history , but limited, chart reviewed She lives in a retirement home She says she was not feeling well for some time Yesterday she was very weak She is also having lower abdominal pain Has no cough or sob Smoker In the Ed vitals  04/02/22  BP 104/67  Temp 98.5 F (36.9 C)  Pulse Rate 88  Resp 19  SpO2 98 %    Latest Reference Range & Units 04/02/22  WBC 4.0 - 10.5 K/uL 17.0 (H)  Hemoglobin 12.0 - 15.0 g/dL 11.3 (L)  HCT 36.0 - 46.0 % 34.8 (L)  Platelets 150 - 400 K/uL 473 (H)  Creatinine 0.44 - 1.00 mg/dL 0.80   Cxr revealed a rt lung mass Blood culture sent, UA sent Started on vanco and cefepime CT chest showed a necrotic mass I am asked to see her for the same   Past Medical History:  Diagnosis Date   Bipolar affective ([place])    Breast cancer ([place])    Cancer ([place]) 12-11-14   INVASIVE MAMMARY CARCINOMA/.left breast/ T2 N0   Cardiomegaly    Depression    Emphysema of lung ([place])    Endometriosis    Hyperlipidemia    Osteoporosis    Schizoaffective disorder ([place])    Schizophrenia ([place])    Thyroid nodule    Vitamin D deficiency     Past Surgical History:  Procedure Laterality Date   ABDOMINAL HYSTERECTOMY     BREAST BIOPSY Left 12-11-14   INVASIVE MAMMARY CARCINOMA.   BREAST LUMPECTOMY WITH SENTINEL LYMPH NODE BIOPSY Left 12/31/2014   Procedure: LEFT BREAST LUMPECTOMY WITH ULTRASOUND GUIDED NEEDLE LOCALIZATION, SENTINEL LYMPH NODE BIOPSY ;  Surgeon: Christene Lye, MD;  Location: [facility] ORS;  Service: General;  Laterality: Left;   DIAGNOSTIC MAMMOGRAM  12/04/2014   Done at [facility]-Left Breast   DILATION AND CURETTAGE OF UTERUS     MASTECTOMY Left 2016   SIMPLE MASTECTOMY WITH AXILLARY SENTINEL NODE BIOPSY Left 01/14/2015   Procedure: SIMPLE MASTECTOMY;  Surgeon: Christene Lye, MD;  Location: [facility] ORS;  Service: General;  Laterality: Left;   TENDON REPAIR Right    hand   TONSILLECTOMY     TUBAL LIGATION      Social History   Socioeconomic History   Marital status: Divorced    Spouse name: Not on file   Number of children: Not on file   Years of education: Not on file   Highest education level: Not on file  Occupational History   Not on file  Tobacco Use   Smoking status: Every Day    Packs/day: 0.20    Years: 45.00    Total pack years: 9.00    Types: Cigarettes   Smokeless tobacco: Never  Vaping Use   Vaping Use: Never used  Substance and Sexual Activity   Alcohol use: No    Alcohol/week: 0.0 standard drinks of alcohol   Drug use: No   Sexual activity: Not Currently  Other Topics Concern   Not on file  Social History Narrative   Not on file   Social Determinants of Health   Financial  Resource Strain: Not on file  Food Insecurity: Not on file  Transportation Needs: Not on file  Physical Activity: Not on file  Stress: Not on file  Social Connections: Not on file  Intimate Partner Violence: Not on file    Family History  Problem Relation Age of Onset   Cancer Mother        uterine   Heart attack Father    Breast cancer Neg Hx    Allergies  Allergen Reactions   Cat Hair Extract    Milk-Related Compounds    Mixed Ragweed    Peanut-Containing Drug Products    I? Current Facility-Administered Medications  Medication Dose Route Frequency Provider Last Rate Last Admin   acetaminophen (TYLENOL) tablet 650 mg  650 mg Oral Q6H PRN Cox, Amy N, DO       Or   acetaminophen (TYLENOL) suppository 650 mg  650 mg Rectal Q6H PRN Cox, Amy N, DO       cholecalciferol (VITAMIN D3) 25 MCG (1000 UNIT) tablet 2,000 Units   2,000 Units Oral Daily Cox, Amy N, DO   2,000 Units at 04/03/22 1024   cloZAPine (CLOZARIL) tablet 400 mg  400 mg Oral QHS Cox, Amy N, DO   400 mg at 04/02/22 2235   hydrOXYzine (ATARAX) tablet 25 mg  25 mg Oral BID Cox, Amy N, DO   25 mg at 04/03/22 1030   letrozole [facility]) tablet 2.5 mg  2.5 mg Oral Daily Cox, Amy N, DO       linaclotide (LINZESS) capsule 145 mcg  145 mcg Oral QAC breakfast Cox, Amy N, DO   145 mcg at 04/03/22 0912   montelukast (SINGULAIR) chewable tablet 5 mg  5 mg Oral QHS Cox, Amy N, DO       nicotine (NICODERM CQ - dosed in mg/24 hours) patch 21 mg  21 mg Transdermal Daily PRN Cox, Amy N, DO       ondansetron (ZOFRAN) tablet 4 mg  4 mg Oral Q6H PRN Cox, Amy N, DO       Or   ondansetron (ZOFRAN) injection 4 mg  4 mg Intravenous Q6H PRN Cox, Amy N, DO       pantoprazole (PROTONIX) EC tablet 40 mg  40 mg Oral Daily Cox, Amy N, DO   40 mg at 04/03/22 1026   piperacillin-tazobactam (ZOSYN) IVPB 3.375 g  3.375 g Intravenous Q8H Cox, Amy N, DO   Stopped at 04/03/22 3810   senna-docusate (Senokot-S) tablet 1 tablet  1 tablet Oral QHS PRN Cox, Amy N, DO       simvastatin (ZOCOR) tablet 20 mg  20 mg Oral Daily Cox, Amy N, DO   20 mg at 04/03/22 1027   vancomycin (VANCOCIN) IVPB 1000 mg/200 mL premix  1,000 mg Intravenous Q24H Cox, Amy N, DO       Current Outpatient Medications  Medication Sig Dispense Refill   alendronate (FOSAMAX) 70 MG tablet Take 70 mg by mouth once a week. Take with a full glass of water on an empty stomach.     cetirizine (ZYRTEC) 10 MG tablet Take 10 mg by mouth daily.     cholecalciferol (VITAMIN D) 1000 UNITS tablet Take 2,000 Units by mouth daily.     cloZAPine (CLOZARIL) 100 MG tablet Take 400 mg by mouth daily.     docusate sodium (COLACE) 100 MG capsule Take 100 mg by mouth 2 (two) times daily.     hydrOXYzine (VISTARIL) 25 MG capsule  Take 25 mg by mouth 2 (two) times daily.     letrozole (FEMARA) 2.5 MG tablet Take 1 tablet (2.5 mg total) by mouth  daily. 90 tablet 1   LINZESS 145 MCG CAPS capsule Take 145 mcg by mouth daily.     montelukast (SINGULAIR) 5 MG chewable tablet Chew 5 mg by mouth at bedtime.     omeprazole (PRILOSEC) 20 MG capsule Take 20 mg by mouth daily.     simvastatin (ZOCOR) 20 MG tablet Take 20 mg by mouth daily.     calcium carbonate (OSCAL) 1500 (600 Ca) MG TABS tablet Take 1 tablet by mouth 2 (two) times daily. (Patient not taking: Reported on 04/02/2022)     coal tar-salicylic acid 2 % shampoo Use to wash scalp Tuesday, Thursday and Saturdays. 120 mL 5   hydrOXYzine (ATARAX/VISTARIL) 25 MG tablet Take 25 mg by mouth 3 (three) times daily as needed. (Patient not taking: Reported on 04/02/2022)     ketoconazole (NIZORAL) 2 % cream Apply 1 application topically 2 (two) times daily. (Patient not taking: Reported on 04/02/2022)     ketoconazole (NIZORAL) 2 % shampoo APPLY TO AFFECTED AREA TWICE A WEEK FOR 8 WEEKS; THEN USE AS NEEDED. 120 mL 1   LINZESS 290 MCG CAPS capsule Take 290 mcg by mouth daily. (Patient not taking: Reported on 04/02/2022)     mometasone (ELOCON) 0.1 % lotion Apply to scalp at bedtime Monday - Fridays (Patient not taking: Reported on 04/02/2022) 60 mL 3   nystatin-triamcinolone (MYCOLOG II) cream Apply 1 application topically 2 (two) times daily.  (Patient not taking: Reported on 04/02/2022)  2     Abtx:  Anti-infectives (From admission, onward)    Start     Dose/Rate Route Frequency Ordered Stop   04/03/22 1800  vancomycin (VANCOCIN) IVPB 1000 mg/200 mL premix        1,000 mg 200 mL/hr over 60 Minutes Intravenous Every 24 hours 04/02/22 1617     04/03/22 0500  ceFEPIme (MAXIPIME) 2 g in sodium chloride 0.9 % 100 mL IVPB  Status:  Discontinued        2 g 200 mL/hr over 30 Minutes Intravenous Every 12 hours 04/02/22 1617 04/02/22 1642   04/02/22 1645  piperacillin-tazobactam (ZOSYN) IVPB 3.375 g        3.375 g 12.5 mL/hr over 240 Minutes Intravenous Every 8 hours 04/02/22 1642     04/02/22  1600  ceFEPIme (MAXIPIME) 2 g in sodium chloride 0.9 % 100 mL IVPB  Status:  Discontinued        2 g 200 mL/hr over 30 Minutes Intravenous  Once 04/02/22 1554 04/02/22 1642   04/02/22 1500  Ampicillin-Sulbactam (UNASYN) 3 g in sodium chloride 0.9 % 100 mL IVPB  Status:  Discontinued        3 g 200 mL/hr over 30 Minutes Intravenous  Once 04/02/22 1452 04/02/22 1609   04/02/22 1500  vancomycin (VANCOCIN) IVPB 1000 mg/200 mL premix        1,000 mg 200 mL/hr over 60 Minutes Intravenous  Once 04/02/22 1452 04/02/22 1857       REVIEW OF SYSTEMS:  Const: negative fever, negative chills, says weight loss of 10 pounds in 1 year Eyes: negative diplopia or visual changes, negative eye pain ENT: negative coryza, negative sore throat Resp: negative cough, hemoptysis, dyspnea Cards: negative for chest pain, palpitations, lower extremity edema GU: negative for frequency, dysuria and hematuria GI:+abdominal pain, +, constipation Skin: negative for rash and  pruritus Heme: negative for easy bruising and gum/nose bleeding MS: general weakness, falls Neurolo:negative for headaches, dizziness, vertigo, some memory problems  Psych: denies any psych history Endocrine: negative for thyroid, diabetes Allergy/Immunology- as above Objective:  VITALS:  BP 94/62   Pulse 82   Temp 98.3 F (36.8 C) (Axillary)   Resp 20   Ht '5\' 4"'$  (1.626 m)   Wt 55.3 kg   SpO2 97%   BMI 20.93 kg/m   PHYSICAL EXAM:  General: Alert, cooperative, no distress, chronically ill.  Head: Normocephalic, without obvious abnormality, atraumatic.androgenic alopecia- seb keratosis scalp Eyes: Conjunctivae clear, anicteric sclerae. Pupils are equal ENT Nares normal. No drainage or sinus tenderness. Very poor dentiton Neck: Supple, symmetrical, no adenopathy, thyroid: non tender no carotid bruit and no JVD. Back: No CVA tenderness. Lungs: b/l air entry. Heart: Regular rate and rhythm, no murmur, rub or gallop. Abdomen: Soft,  distended. Tender to palpation Bowel sounds normal. No masses Extremities: atraumatic, no cyanosis. No edema. No clubbing Skin: dry and scaly skin  Lymph: Cervical, supraclavicular normal. Neurologic: rt hand ? Clonus  Pertinent Labs Lab Results CBC    Component Value Date/Time   WBC 13.6 (H) 04/03/2022 0446   RBC 3.52 (L) 04/03/2022 0446   HGB 10.3 (L) 04/03/2022 0446   HCT 32.1 (L) 04/03/2022 0446   PLT 441 (H) 04/03/2022 0446   MCV 91.2 04/03/2022 0446   MCH 29.3 04/03/2022 0446   MCHC 32.1 04/03/2022 0446   RDW 13.0 04/03/2022 0446   LYMPHSABS 0.6 (L) 04/02/2022 1252   MONOABS 1.0 04/02/2022 1252   EOSABS 0.0 04/02/2022 1252   BASOSABS 0.0 04/02/2022 1252       Latest Ref Rng & Units 04/03/2022    4:46 AM 04/02/2022    4:52 PM 04/02/2022   12:52 PM  CMP  Glucose 70 - 99 mg/dL 130   108   BUN 8 - 23 mg/dL 8   17   Creatinine 0.44 - 1.00 mg/dL 0.60   0.80   Sodium 135 - 145 mmol/L 138   139   Potassium 3.5 - 5.1 mmol/L 4.2  3.1  2.4   Chloride 98 - 111 mmol/L 106   104   CO2 22 - 32 mmol/L 26   27   Calcium 8.9 - 10.3 mg/dL 7.6   7.8   Total Protein 6.5 - 8.1 g/dL   6.6   Total Bilirubin 0.3 - 1.2 mg/dL   0.6   Alkaline Phos 38 - 126 U/L   88   AST 15 - 41 U/L   43   ALT 0 - 44 U/L   40       Microbiology: Recent Results (from the past 240 hour(s))  Resp panel by RT-PCR (RSV, Flu A&B, Covid) Anterior Nasal Swab     Status: None   Collection Time: 04/02/22 12:56 PM   Specimen: Anterior Nasal Swab  Result Value Ref Range Status   SARS Coronavirus 2 by RT PCR NEGATIVE NEGATIVE Final    Comment: (NOTE) SARS-CoV-2 target nucleic acids are NOT DETECTED.  The SARS-CoV-2 RNA is generally detectable in upper respiratory specimens during the acute phase of infection. The lowest concentration of SARS-CoV-2 viral copies this assay can detect is 138 copies/mL. A negative result does not preclude SARS-Cov-2 infection and should not be used as the sole basis for  treatment or other patient management decisions. A negative result may occur with  improper specimen collection/handling, submission of specimen other than nasopharyngeal  swab, presence of viral mutation(s) within the areas targeted by this assay, and inadequate number of viral copies(<138 copies/mL). A negative result must be combined with clinical observations, patient history, and epidemiological information. The expected result is Negative.  Fact Sheet for Patients:  EntrepreneurPulse.com.au  Fact Sheet for Healthcare Providers:  IncredibleEmployment.be  This test is no t yet approved or cleared by the [place] FDA and  has been authorized for detection and/or diagnosis of SARS-CoV-2 by FDA under an Emergency Use Authorization (EUA). This EUA will remain  in effect (meaning this test can be used) for the duration of the COVID-19 declaration under Section 564(b)(1) of the Act, 21 U.S.C.section 360bbb-3(b)(1), unless the authorization is terminated  or revoked sooner.       Influenza A by PCR NEGATIVE NEGATIVE Final   Influenza B by PCR NEGATIVE NEGATIVE Final    Comment: (NOTE) The Xpert Xpress SARS-CoV-2/FLU/RSV plus assay is intended as an aid in the diagnosis of influenza from Nasopharyngeal swab specimens and should not be used as a sole basis for treatment. Nasal washings and aspirates are unacceptable for Xpert Xpress SARS-CoV-2/FLU/RSV testing.  Fact Sheet for Patients: EntrepreneurPulse.com.au  Fact Sheet for Healthcare Providers: IncredibleEmployment.be  This test is not yet approved or cleared by the [place] FDA and has been authorized for detection and/or diagnosis of SARS-CoV-2 by FDA under an Emergency Use Authorization (EUA). This EUA will remain in effect (meaning this test can be used) for the duration of the COVID-19 declaration under Section 564(b)(1) of the Act, 21  U.S.C. section 360bbb-3(b)(1), unless the authorization is terminated or revoked.     Resp Syncytial Virus by PCR NEGATIVE NEGATIVE Final    Comment: (NOTE) Fact Sheet for Patients: EntrepreneurPulse.com.au  Fact Sheet for Healthcare Providers: IncredibleEmployment.be  This test is not yet approved or cleared by the [place] FDA and has been authorized for detection and/or diagnosis of SARS-CoV-2 by FDA under an Emergency Use Authorization (EUA). This EUA will remain in effect (meaning this test can be used) for the duration of the COVID-19 declaration under Section 564(b)(1) of the Act, 21 U.S.C. section 360bbb-3(b)(1), unless the authorization is terminated or revoked.  Performed at [facility], [address] [place], [place] [address]   Culture, blood (Routine X 2) w Reflex to ID Panel     Status: None (Preliminary result)   Collection Time: 04/02/22  3:50 PM   Specimen: BLOOD  Result Value Ref Range Status   Specimen Description BLOOD BLOOD LEFT ARM  Final   Special Requests   Final    BOTTLES DRAWN AEROBIC AND ANAEROBIC Blood Culture results may not be optimal due to an inadequate volume of blood received in culture bottles   Culture   Final    NO GROWTH < 24 HOURS Performed at [facility], [address] [place], [place] [address]    Report Status PENDING  Incomplete  Respiratory (~20 pathogens) panel by PCR     Status: None   Collection Time: 04/02/22  3:50 PM   Specimen: Respiratory  Result Value Ref Range Status   Adenovirus NOT DETECTED NOT DETECTED Final   Coronavirus 229E NOT DETECTED NOT DETECTED Final    Comment: (NOTE) The Coronavirus on the Respiratory Panel, DOES NOT test for the novel  Coronavirus (2019 nCoV)    Coronavirus HKU1 NOT DETECTED NOT DETECTED Final   Coronavirus NL63 NOT DETECTED NOT DETECTED Final   Coronavirus OC43 NOT DETECTED NOT DETECTED Final  Metapneumovirus NOT  DETECTED NOT DETECTED Final   Rhinovirus / Enterovirus NOT DETECTED NOT DETECTED Final   Influenza A NOT DETECTED NOT DETECTED Final   Influenza B NOT DETECTED NOT DETECTED Final   Parainfluenza Virus 1 NOT DETECTED NOT DETECTED Final   Parainfluenza Virus 2 NOT DETECTED NOT DETECTED Final   Parainfluenza Virus 3 NOT DETECTED NOT DETECTED Final   Parainfluenza Virus 4 NOT DETECTED NOT DETECTED Final   Respiratory Syncytial Virus NOT DETECTED NOT DETECTED Final   Bordetella pertussis NOT DETECTED NOT DETECTED Final   Bordetella Parapertussis NOT DETECTED NOT DETECTED Final   Chlamydophila pneumoniae NOT DETECTED NOT DETECTED Final   Mycoplasma pneumoniae NOT DETECTED NOT DETECTED Final    Comment: Performed at [facility], [place] [address] [place], [place] [address]  Culture, blood (Routine X 2) w Reflex to ID Panel     Status: None (Preliminary result)   Collection Time: 04/02/22  4:52 PM   Specimen: BLOOD  Result Value Ref Range Status   Specimen Description BLOOD BLOOD RIGHT ARM  Final   Special Requests   Final    BOTTLES DRAWN AEROBIC AND ANAEROBIC Blood Culture adequate volume   Culture   Final    NO GROWTH < 12 HOURS Performed at [facility], [address] [place], [place] [address]    Report Status PENDING  Incomplete  MRSA Next Gen by PCR, Nasal     Status: Abnormal   Collection Time: 04/02/22  5:50 PM  Result Value Ref Range Status   MRSA by PCR Next Gen DETECTED (A) NOT DETECTED Final    Comment: RESULT CALLED TO, READ BACK BY AND VERIFIED WITH: Butler Denmark '@[birth year]21'$  on 03/03/22 skl (NOTE) The GeneXpert MRSA Assay (FDA approved for NASAL specimens only), is one component of a comprehensive MRSA colonization surveillance program. It is not intended to diagnose MRSA infection nor to guide or monitor treatment for MRSA infections. Test performance is not FDA approved in patients less than 54 years old. Performed at [facility], [address] [place],  [address]     IMAGING RESULTS:  Well circumscribed mass 7X 8cm rt hemithorax- involving both upper and middle lobe CT head N I have personally reviewed the films ? Impression/Recommendation 71 y.o. female with a history of Bipolar disorder, breast ca s.p simple mastectomy left , now on letrazole, presents to the ED on 04/02/22 with weakness, pain abdome? ? Necrotic mass rt lung - this was not present in Aug CXR She does not have resp symptoms She has bad dentition R/o necrotizing pneumonia- r/o bacterial VS actinomyces/fungal Has MRSA nares positive but she is not sick enough for MRSA But will treatr for it with linezolid and will do unasyn  Dc zosyn and vanco She will get lung biopsy  Anemia Poor nutrition  Abdominal pain- CT abdomen shows stool in descending colon and rectum  R/o UTI  H/o ca breast left mastectomy. On letrazole  Bipolar/schizoaffective - on clozapine? ___________________________________________________ Discussed with patient, requesting provider ID will follow her peripherally this weekend- call if needed Note:  This document was prepared using Dragon voice recognition software and may include unintentional dictation errors.

## 2022-04-04 DIAGNOSIS — A419 Sepsis, unspecified organism: Secondary | ICD-10-CM | POA: Diagnosis not present

## 2022-04-04 DIAGNOSIS — R918 Other nonspecific abnormal finding of lung field: Secondary | ICD-10-CM | POA: Diagnosis not present

## 2022-04-04 DIAGNOSIS — K219 Gastro-esophageal reflux disease without esophagitis: Secondary | ICD-10-CM | POA: Diagnosis not present

## 2022-04-04 DIAGNOSIS — R531 Weakness: Secondary | ICD-10-CM | POA: Diagnosis not present

## 2022-04-04 DIAGNOSIS — E876 Hypokalemia: Secondary | ICD-10-CM | POA: Diagnosis not present

## 2022-04-04 DIAGNOSIS — F209 Schizophrenia, unspecified: Secondary | ICD-10-CM

## 2022-04-04 DIAGNOSIS — D72829 Elevated white blood cell count, unspecified: Secondary | ICD-10-CM | POA: Diagnosis not present

## 2022-04-04 LAB — CBC
HCT: 29.7 % — ABNORMAL LOW (ref 36.0–46.0)
Hemoglobin: 9.7 g/dL — ABNORMAL LOW (ref 12.0–15.0)
MCH: 29.3 pg (ref 26.0–34.0)
MCHC: 32.7 g/dL (ref 30.0–36.0)
MCV: 89.7 fL (ref 80.0–100.0)
Platelets: 434 10*3/uL — ABNORMAL HIGH (ref 150–400)
RBC: 3.31 MIL/uL — ABNORMAL LOW (ref 3.87–5.11)
RDW: 13.1 % (ref 11.5–15.5)
WBC: 11.8 10*3/uL — ABNORMAL HIGH (ref 4.0–10.5)
nRBC: 0 % (ref 0.0–0.2)

## 2022-04-04 LAB — BASIC METABOLIC PANEL
Anion gap: 5 (ref 5–15)
BUN: 7 mg/dL — ABNORMAL LOW (ref 8–23)
CO2: 24 mmol/L (ref 22–32)
Calcium: 7.5 mg/dL — ABNORMAL LOW (ref 8.9–10.3)
Chloride: 108 mmol/L (ref 98–111)
Creatinine, Ser: 0.46 mg/dL (ref 0.44–1.00)
GFR, Estimated: >60 mL/min (ref >60)
Glucose, Bld: 111 mg/dL — ABNORMAL HIGH (ref 70–99)
Potassium: 3.7 mmol/L (ref 3.5–5.1)
Sodium: 137 mmol/L (ref 135–145)

## 2022-04-04 LAB — MAGNESIUM: Magnesium: 2.1 mg/dL (ref 1.7–2.4)

## 2022-04-04 NOTE — Progress Notes (Signed)
PROGRESS NOTE    Gunhild Bautch  OVZ:[id number] DOB: [birth year] DOA: 04/02/2022 PCP: Remi Haggard, FNP    Brief Narrative:  Ms. Thora Scherman is a 71 year old female with history of bipolar disorder,, hyperlipidemia, schizophrenia, to hospital with generalized weakness and ambulatory dysfunction.  In the ED patient had stable vitals.  Laboratory data showed potassium of 2.4.  WBC was elevated at 17.0.  COVID and influenza RSV PCR was negative.  Magnesium was 2.1.  Chest x-ray showed large possibly pleural-based mass projecting over the right perihilar region.  CT scan of the chest was recommended which showed a well-circumscribed homogeneous mass anteromedially in the right hemithorax with central low-density suspicious for necrosis. In the ED, patient received 1 L of IV fluid bolus, magnesium sulfate 2 g, potassium and was admitted to the hospital for further evaluation and treatment.  Pulmonary and ID was notified.  At this time patient is awaiting for CT-guided biopsy.  ID has started the patient on broad-spectrum antibiotic.  Assessment and plan.   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  Unable to rule out pulmonary abscess/atypical infection.  Pulmonary and infectious disease on board.  On Unasyn and linezolid. Blood cultures negative in 2 days.  Respiratory viral panel,  COVID and influenza negative as well.  Pending Legionella, Aspergillus, cryptococcal, histoplasma tests.  Procalcitonin was 0.1.  MRSA PCR positive.  HIV was nonreactive.  CRP elevated at 22.4.  Pulmonary has recommended a CT-guided biopsy.  Will follow pulmonary recommendation.  Pending CT-guided biopsy   Sepsis Patient met sepsis criteria with elevated respiration rate, heart rate, leukocytosis of 17, possible source  pulmonary abscess. Continue linezolid and Unasyn.  Follow pulmonary recommendations.  ID on board.  Hypokalemia Improved at this time.  Potassium of 3.7 today.    Leukocytosis WBC at 11.8 from 13.6.  Blood cultures negative so far.  Urinalysis abnormal.  Will monitor CBC in AM.  Nonspecific abdominal pain constipation.  CT scan of the abdomen and pelvis with contrast did not show any acute findings but stool in the colon..  Will focus on constipation resolution.  Continue MiraLAX and Colace.   Schizophrenia ([place]) Continue clozapine.   GERD (gastroesophageal reflux disease) Continue Protonix.   Tobacco use On nicotine patch.  Malignant neoplasm of left female breast Continue letrozole from home.     DVT prophylaxis: Place TED hose Start: 04/02/22 1507   Code Status:     Code Status: Full Code  Disposition:  Home likely in 2 to 3 days.  Follow ID and pulmonary recommendation.  Status is: Inpatient  Remains inpatient appropriate because: IV antibiotic, lung mass, pulmonary and ID follow-up,   Family Communication: None at bedside  Consultants:  Pulmonary Infectious disease  Procedures:  None so far  Antimicrobials:  Unasyn and linezolid  Anti-infectives (From admission, onward)    Start     Dose/Rate Route Frequency Ordered Stop   04/04/22 0000  Ampicillin-Sulbactam (UNASYN) 3 g in sodium chloride 0.9 % 100 mL IVPB        3 g 200 mL/hr over 30 Minutes Intravenous Every 6 hours 04/03/22 1403     04/03/22 1800  vancomycin (VANCOCIN) IVPB 1000 mg/200 mL premix  Status:  Discontinued        1,000 mg 200 mL/hr over 60 Minutes Intravenous Every 24 hours 04/02/22 1617 04/03/22 1403   04/03/22 1800  linezolid (ZYVOX) IVPB 600 mg        600 mg 300 mL/hr over 60 Minutes Intravenous  Every 12 hours 04/03/22 1403     04/03/22 0500  ceFEPIme (MAXIPIME) 2 g in sodium chloride 0.9 % 100 mL IVPB  Status:  Discontinued        2 g 200 mL/hr over 30 Minutes Intravenous Every 12 hours 04/02/22 1617 04/02/22 1642   04/02/22 1645  piperacillin-tazobactam (ZOSYN) IVPB 3.375 g        3.375 g 12.5 mL/hr over 240 Minutes Intravenous Every 8  hours 04/02/22 1642 04/03/22 1802   04/02/22 1600  ceFEPIme (MAXIPIME) 2 g in sodium chloride 0.9 % 100 mL IVPB  Status:  Discontinued        2 g 200 mL/hr over 30 Minutes Intravenous  Once 04/02/22 1554 04/02/22 1642   04/02/22 1500  Ampicillin-Sulbactam (UNASYN) 3 g in sodium chloride 0.9 % 100 mL IVPB  Status:  Discontinued        3 g 200 mL/hr over 30 Minutes Intravenous  Once 04/02/22 1452 04/02/22 1609   04/02/22 1500  vancomycin (VANCOCIN) IVPB 1000 mg/200 mL premix        1,000 mg 200 mL/hr over 60 Minutes Intravenous  Once 04/02/22 1452 04/02/22 1857      Subjective: Today, patient was seen and examined at bedside.  Still complains of mild abdominal discomfort.  States that she had a bowel movement few days back.  Denies any nausea vomiting cough chest pain.   Objective: Vitals:   04/03/22 2327 04/04/22 0431 04/04/22 0905 04/04/22 1235  BP: 94/65 109/69 107/69 107/67  Pulse: 83 86 91 92  Resp: _0 Temp: (!) 97.3 F (36.3 C) 98.9 F (37.2 C) 99.9 F (37.7 C) 98.2 F (36.8 C)  TempSrc:   Oral   SpO2: 97% 96% 94% 97%  Weight:      Height:        Intake/Output Summary (Last 24 hours) at 04/04/2022 1319 Last data filed at 04/04/2022 0551 Gross per 24 hour  Intake 1155.92 ml  Output 400 ml  Net 755.92 ml    Filed Weights   04/02/22 1245  Weight: 55.3 kg    Physical Examination: Body mass index is 20.93 kg/m.   General: Thinly built, deconditioned, alert awake and Communicative  HENT:   No scleral pallor or icterus noted. Oral mucosa is moist.  Chest:    Diminished breath sounds bilaterally.  No wheezes or crackles noted. CVS: S1 &S2 heard. No murmur.  Regular rate and rhythm. Abdomen: Soft, nondistended.  Bowel sounds are heard.  Nonspecific tenderness on palpation. Extremities: No cyanosis, clubbing or edema.  Peripheral pulses are palpable. Psych: Alert, awake and oriented, normal mood, poor insight.  Flat affect CNS:  No cranial nerve deficits.   Power equal in all extremities.   Skin: Warm and dry.  No rashes noted.  Data Reviewed:   CBC: Recent Labs  Lab 04/02/22 1252 04/03/22 0446 04/04/22 0456  WBC 17.0* 13.6* 11.8*  NEUTROABS 15.1*  --   --   HGB 11.3* 10.3* 9.7*  HCT 34.8* 32.1* 29.7*  MCV 90.4 91.2 89.7  PLT 473* 441* 434*     Basic Metabolic Panel: Recent Labs  Lab 04/02/22 1252 04/02/22 1652 04/03/22 0446 04/04/22 0456  NA 139  --  138 137  K 2.4* 3.1* 4.2 3.7  CL 104  --  106 108  CO2 27  --  26 24  GLUCOSE 108*  --  130* 111*  BUN 17  --  8 7*  CREATININE 0.80  --  0.60 0.46  CALCIUM 7.8*  --  7.6* 7.5*  MG 2.1  --  2.3 2.1     Liver Function Tests: Recent Labs  Lab 04/02/22 1252  AST 43*  ALT 40  ALKPHOS 88  BILITOT 0.6  PROT 6.6  ALBUMIN 2.6*      Radiology Studies: CT ABDOMEN PELVIS W CONTRAST  Result Date: 04/03/2022 CLINICAL DATA:  Abdominal pain, acute, nonlocalized. EXAM: CT ABDOMEN AND PELVIS WITH CONTRAST TECHNIQUE: Multidetector CT imaging of the abdomen and pelvis was performed using the standard protocol following bolus administration of intravenous contrast. RADIATION DOSE REDUCTION: This exam was performed according to the departmental dose-optimization program which includes automated exposure control, adjustment of the mA and/or kV according to patient size and/or use of iterative reconstruction technique. CONTRAST:  22m OMNIPAQUE IOHEXOL 300 MG/ML  SOLN COMPARISON:  CT examination dated December 11, 2021 FINDINGS: Lower chest: Bibasilar atelectasis. Hepatobiliary: No focal liver abnormality is seen. No gallstones, gallbladder wall thickening, or biliary dilatation. Pancreas: Unremarkable. No pancreatic ductal dilatation or surrounding inflammatory changes. Spleen: Normal in size without focal abnormality. Adrenals/Urinary Tract: Adrenal glands are unremarkable. Kidneys are normal, without renal calculi, focal lesion, or hydronephrosis. Bladder is unremarkable. Stomach/Bowel:  Stomach is within normal limits. Small bowel loops are normal in caliber. Appendix not identified. There is moderate amount of stool in the descending and rectosigmoid colon. There is gaseous distention of the transverse colon. No bowel wall thickening or inflammatory changes. Vascular/Lymphatic: Moderate aortic atherosclerosis. No enlarged abdominal or pelvic lymph nodes. Reproductive: Status post hysterectomy. No adnexal masses. Other: No abdominal wall hernia or abnormality. No abdominopelvic ascites. Musculoskeletal: Multilevel degenerate disc disease with disc height loss and marginal osteophytes. Grade 1 anterolisthesis of L4, unchanged. Levoscoliosis of the lumbar spine. IMPRESSION: 1. Moderate amount of stool in the descending and rectosigmoid colon with gaseous distention of the transverse colon. No evidence of bowel obstruction or inflammatory changes. 2. Moderate aortic atherosclerosis. 3. Multilevel degenerate disc disease with disc height loss and marginal osteophytes. Grade 1 anterolisthesis of L4, unchanged. Levoscoliosis of the lumbar spine. Electronically Signed   By: IKeane PoliceD.O.   On: 04/03/2022 14:46   CT Chest W Contrast  Result Date: 04/02/2022 CLINICAL DATA:  Abnormal x-ray. Right chest mass. * Tracking Code: BO * EXAM: CT CHEST WITH CONTRAST TECHNIQUE: Multidetector CT imaging of the chest was performed during intravenous contrast administration. RADIATION DOSE REDUCTION: This exam was performed according to the departmental dose-optimization program which includes automated exposure control, adjustment of the mA and/or kV according to patient size and/or use of iterative reconstruction technique. CONTRAST:  745mOMNIPAQUE IOHEXOL 300 MG/ML  SOLN COMPARISON:  Chest radiographs 04/02/2022 and 12/03/2021. Abdominal CT 12/11/2021. FINDINGS: Cardiovascular: No acute vascular findings. Diffuse coronary artery atherosclerosis with lesser involvement of the aorta and great vessels. The  heart size is normal. There is a small amount of pericardial fluid. Mediastinum/Nodes: There are no enlarged mediastinal, hilar or axillary lymph nodes. The thyroid gland, trachea and esophagus demonstrate no significant findings. Lungs/Pleura: No pleural effusion or pneumothorax. As seen on earlier radiographs, there is a well-circumscribed mass anteromedially in the right hemithorax which measures approximately 7.7 x 5.4 x 8.2 cm. This abuts the anterior chest wall and anterior mediastinum and has components within both the upper and middle lobes. This mass is heterogeneous with central low-density suspicious for necrosis. There is a peripheral irregular thick wall. There is no air within this collection. Minimal surrounding airspace disease which may reflect atelectasis. There  is additional mild dependent atelectasis in both lung bases. Mild underlying centrilobular and paraseptal emphysema. No other suspicious pulmonary nodules. Upper abdomen: No significant findings are demonstrated within the visualized upper abdomen. Musculoskeletal/Chest wall: There is no chest wall mass or suspicious osseous finding. No chest wall involvement by the mass is demonstrated. Thoracolumbar spondylosis noted. IMPRESSION: 1. Well-circumscribed heterogeneous mass anteromedially in the right hemithorax with central low-density suspicious for necrosis. By imaging, this is highly suspicious for malignancy. However, this mass is not apparent on the portable chest x-ray or abdominal CT done less than 4 months ago such that pulmonary abscess or other atypical infection should be considered in this patient presenting with a cough. Management options include short-term CT follow-up after appropriate antibiotic therapy and tissue sampling to assess for infection and malignancy. 2. No evidence of metastatic disease. 3. No pleural effusion or pneumothorax. 4. Diffuse coronary artery atherosclerosis. Aortic Atherosclerosis (ICD10-I70.0) and  Emphysema (ICD10-J43.9). Electronically Signed   By: Richardean Sale M.D.   On: 04/02/2022 14:31      LOS: 2 days   Flora Lipps, MD [facility] Available via Epic secure chat 7am-7pm After these hours, please refer to coverage provider listed on [email or web address] 04/04/2022, 1:19 PM

## 2022-04-04 NOTE — TOC Initial Note (Addendum)
Transition of Care [facility]) - Initial/Assessment Note    Patient Details  Name: Selena Pittman MRN: [id number] Date of Birth: 11-02-1950  [facility] of [facility]) CM/SW Contact:    Magnus Ivan, LCSW Phone Number: 04/04/2022, 12:00 PM  Clinical Narrative:                 Per chart review, patient resides at a facility. Checked with RN who states patient is from [facility]. CSW called [facility] at [phone]. Spoke to staff member Lanette who confirmed patient resides there. Lanette states they are an Emergency planning/management officer.  Lanette stated patient was mostly independent at baseline and did not use DME prior to hospitalization.  Lanette stated patient son, [name] should be the main contact for patient. She provided the number [phone] for [facility]. Son not listed in chart currently.  Per chart review and speaking with RN, patient does have some confusion but would be able to tell if we can talk to her son. Asked RN to check with patient before staff reaches out to son. Patient confirms it is ok to reach out to son.   Expected Discharge Plan: Assisted Living Barriers to Discharge: Continued Medical Work up   Patient Goals and CMS Choice        Expected Discharge Plan and Services Expected Discharge Plan: Assisted Living       Living arrangements for the past 2 months: [place]                                      Prior Living Arrangements/Services Living arrangements for the past 2 months: [place] Lives with:: Facility Resident              Current home services: DME    Activities of Daily Living Home Assistive Devices/Equipment: None ADL Screening (condition at time of admission) Patient's cognitive ability adequate to safely complete daily activities?: Yes Is the patient deaf or have difficulty hearing?: No Does the patient have difficulty seeing, even when wearing glasses/contacts?: No Does the  patient have difficulty concentrating, remembering, or making decisions?: No Patient able to express need for assistance with ADLs?: Yes Does the patient have difficulty dressing or bathing?: Yes Independently performs ADLs?: No Toileting: Needs assistance Does the patient have difficulty walking or climbing stairs?: Yes Weakness of Legs: Both Weakness of Arms/Hands: None  Permission Sought/Granted                  Emotional Assessment       Orientation: : Fluctuating Orientation (Suspected and/or reported Sundowners) Alcohol / Substance Use: Not Applicable Psych Involvement: No (comment)  Admission diagnosis:  Lung mass [R91.8] Generalized weakness [R53.1] Sepsis ([place]) [A41.9] Patient Active Problem List   Diagnosis Date Noted   Sepsis ([place]) 04/02/2022   Tobacco use 04/02/2022   GERD (gastroesophageal reflux disease) 04/02/2022   Schizophrenia ([place]) 04/02/2022   Leukocytosis 04/02/2022   Hypokalemia 04/02/2022   Pulmonary parenchymal mass 04/02/2022   Malignant neoplasm of left female breast ([place]) 06/05/2015   PCP:  Remi Haggard, FNP Pharmacy:   [place], [place] - [address] [place] [address] Phone: [phone] Fax: [phone]  [place], [place] - [place] [address] [place] [address] Phone: [phone] Fax: [phone]     Social Determinants of Health ([place])  Interventions    Readmission Risk Interventions     No data to display

## 2022-04-05 DIAGNOSIS — E876 Hypokalemia: Secondary | ICD-10-CM | POA: Diagnosis not present

## 2022-04-05 DIAGNOSIS — R918 Other nonspecific abnormal finding of lung field: Secondary | ICD-10-CM | POA: Diagnosis not present

## 2022-04-05 DIAGNOSIS — D72829 Elevated white blood cell count, unspecified: Secondary | ICD-10-CM | POA: Diagnosis not present

## 2022-04-05 DIAGNOSIS — K219 Gastro-esophageal reflux disease without esophagitis: Secondary | ICD-10-CM | POA: Diagnosis not present

## 2022-04-05 NOTE — Progress Notes (Signed)
PROGRESS NOTE    Selena Pittman  JTT:[id number] DOB: Jun 03, 1950 DOA: 04/02/2022 PCP: Remi Haggard, FNP    Brief Narrative:  [name] is a 71 year old female with history of bipolar disorder,, hyperlipidemia, schizophrenia presented to the hospital with generalized weakness and ambulatory dysfunction.  In the ED patient had stable vitals.  Laboratory data showed potassium of 2.4.  WBC was elevated at 17.0.  COVID and influenza RSV PCR was negative.  Magnesium was 2.1.  Chest x-ray showed large possibly pleural-based mass projecting over the right perihilar region.  CT scan of the chest was recommended which showed a well-circumscribed homogeneous mass anteromedially in the right hemithorax with central low-density suspicious for necrosis. In the ED, patient received 1 L of IV fluid bolus, magnesium sulfate 2 g, potassium and was admitted to the hospital for further evaluation and treatment.  Pulmonary and ID was notified.  At this time patient is awaiting for CT-guided biopsy.  ID has started the patient on broad-spectrum antibiotic.  Assessment and plan.   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  Unable to rule out pulmonary abscess/atypical infection.  Pulmonary and infectious disease on board.  On Unasyn and linezolid. Blood cultures negative in 2 days.  Respiratory viral panel,  COVID and influenza negative as well.  Pending Legionella, Aspergillus, cryptococcal, histoplasma tests.  Procalcitonin was 0.1.  MRSA PCR positive.  HIV was nonreactive.  CRP elevated at 22.4.  Pulmonary has recommended a CT-guided biopsy. Pending CT-guided biopsy   Sepsis Patient met sepsis criteria with elevated respiration rate, heart rate, leukocytosis of 17, possible source as pulmonary abscess. Continue linezolid and Unasyn.  Follow pulmonary recommendations.  ID on board.  Hypokalemia Improved at this time.  Potassium of 3.7 today.   Leukocytosis WBC at  11.8 from 13.6.  Blood cultures negative so far.  Urinalysis abnormal.  Will monitor CBC in AM.  Nonspecific abdominal pain constipation.  CT scan of the abdomen and pelvis with contrast did not show any acute findings but stool in the colon..  Improved today.  Continue MiraLAX and Colace.   Schizophrenia ([place]) Continue clozapine.   GERD (gastroesophageal reflux disease) Continue Protonix.   Tobacco use On nicotine patch.  Malignant neoplasm of left female breast Continue letrozole from home.     DVT prophylaxis: Place TED hose Start: 04/02/22 1507   Code Status:     Code Status: Full Code  Disposition:  Home likely in 2 to 3 days.  Follow ID and pulmonary recommendation.  Await CT-guided biopsy.  Status is: Inpatient  Remains inpatient appropriate because: IV antibiotic, lung mass, pulmonary and ID follow-up, CT-guided biopsy   Family Communication: None at bedside  Consultants:  Pulmonary Infectious disease  Procedures:  None so far  Antimicrobials:  Unasyn and linezolid-  Anti-infectives (From admission, onward)    Start     Dose/Rate Route Frequency Ordered Stop   04/04/22 0000  Ampicillin-Sulbactam (UNASYN) 3 g in sodium chloride 0.9 % 100 mL IVPB        3 g 200 mL/hr over 30 Minutes Intravenous Every 6 hours 04/03/22 1403     04/03/22 1800  vancomycin (VANCOCIN) IVPB 1000 mg/200 mL premix  Status:  Discontinued        1,000 mg 200 mL/hr over 60 Minutes Intravenous Every 24 hours 04/02/22 1617 04/03/22 1403   04/03/22 1800  linezolid (ZYVOX) IVPB 600 mg        600 mg 300 mL/hr over 60 Minutes Intravenous Every  12 hours 04/03/22 1403     04/03/22 0500  ceFEPIme (MAXIPIME) 2 g in sodium chloride 0.9 % 100 mL IVPB  Status:  Discontinued        2 g 200 mL/hr over 30 Minutes Intravenous Every 12 hours 04/02/22 1617 04/02/22 1642   04/02/22 1645  piperacillin-tazobactam (ZOSYN) IVPB 3.375 g        3.375 g 12.5 mL/hr over 240 Minutes Intravenous Every 8 hours  04/02/22 1642 04/03/22 1802   04/02/22 1600  ceFEPIme (MAXIPIME) 2 g in sodium chloride 0.9 % 100 mL IVPB  Status:  Discontinued        2 g 200 mL/hr over 30 Minutes Intravenous  Once 04/02/22 1554 04/02/22 1642   04/02/22 1500  Ampicillin-Sulbactam (UNASYN) 3 g in sodium chloride 0.9 % 100 mL IVPB  Status:  Discontinued        3 g 200 mL/hr over 30 Minutes Intravenous  Once 04/02/22 1452 04/02/22 1609   04/02/22 1500  vancomycin (VANCOCIN) IVPB 1000 mg/200 mL premix        1,000 mg 200 mL/hr over 60 Minutes Intravenous  Once 04/02/22 1452 04/02/22 1857      Subjective:  Today, patient was seen and examined at bedside.  Feels okay today.  No abdominal pain nausea vomiting.  Denies any shortness of breath chest pain fever chills.    Objective: Vitals:   04/04/22 1958 04/05/22 0034 04/05/22 0405 04/05/22 0807  BP: 114/74 111/71 106/74 110/78  Pulse: 88 94 90 90  Resp: _0 Temp: 99.5 F (37.5 C) 98.3 F (36.8 C) 100 F (37.8 C) 99.9 F (37.7 C)  TempSrc: Oral Axillary Oral   SpO2: 96% 96% 95% 96%  Weight:      Height:        Intake/Output Summary (Last 24 hours) at 04/05/2022 1136 Last data filed at 04/05/2022 0409 Gross per 24 hour  Intake 120 ml  Output 1700 ml  Net -1580 ml    Filed Weights   04/02/22 1245  Weight: 55.3 kg    Physical Examination: Body mass index is 20.93 kg/m.   General: Alert awake and Communicative, appears thinly built. HENT:   No scleral pallor or icterus noted. Oral mucosa is moist.  Right supraorbital region with laceration. Chest:    Diminished breath sounds bilaterally.  No wheezes or crackles noted. CVS: S1 &S2 heard. No murmur.  Regular rate and rhythm. Abdomen: Soft, nondistended.  Bowel sounds are heard.   Extremities: No cyanosis, clubbing or edema.  Peripheral pulses are palpable. Psych: Alert, awake and oriented, normal mood, has poor insight, flat affect. CNS:  No cranial nerve deficits.  Power equal in all  extremities.   Skin: Warm and dry.  No rashes noted.  Scalp wart noted.  Data Reviewed:   CBC: Recent Labs  Lab 04/02/22 1252 04/03/22 0446 04/04/22 0456  WBC 17.0* 13.6* 11.8*  NEUTROABS 15.1*  --   --   HGB 11.3* 10.3* 9.7*  HCT 34.8* 32.1* 29.7*  MCV 90.4 91.2 89.7  PLT 473* 441* 434*     Basic Metabolic Panel: Recent Labs  Lab 04/02/22 1252 04/02/22 1652 04/03/22 0446 04/04/22 0456  NA 139  --  138 137  K 2.4* 3.1* 4.2 3.7  CL 104  --  106 108  CO2 27  --  26 24  GLUCOSE 108*  --  130* 111*  BUN 17  --  8 7*  CREATININE 0.80  --  0.60 0.46  CALCIUM 7.8*  --  7.6* 7.5*  MG 2.1  --  2.3 2.1     Liver Function Tests: Recent Labs  Lab 04/02/22 1252  AST 43*  ALT 40  ALKPHOS 88  BILITOT 0.6  PROT 6.6  ALBUMIN 2.6*      Radiology Studies: CT ABDOMEN PELVIS W CONTRAST  Result Date: 04/03/2022 CLINICAL DATA:  Abdominal pain, acute, nonlocalized. EXAM: CT ABDOMEN AND PELVIS WITH CONTRAST TECHNIQUE: Multidetector CT imaging of the abdomen and pelvis was performed using the standard protocol following bolus administration of intravenous contrast. RADIATION DOSE REDUCTION: This exam was performed according to the departmental dose-optimization program which includes automated exposure control, adjustment of the mA and/or kV according to patient size and/or use of iterative reconstruction technique. CONTRAST:  48m OMNIPAQUE IOHEXOL 300 MG/ML  SOLN COMPARISON:  CT examination dated December 11, 2021 FINDINGS: Lower chest: Bibasilar atelectasis. Hepatobiliary: No focal liver abnormality is seen. No gallstones, gallbladder wall thickening, or biliary dilatation. Pancreas: Unremarkable. No pancreatic ductal dilatation or surrounding inflammatory changes. Spleen: Normal in size without focal abnormality. Adrenals/Urinary Tract: Adrenal glands are unremarkable. Kidneys are normal, without renal calculi, focal lesion, or hydronephrosis. Bladder is unremarkable. Stomach/Bowel:  Stomach is within normal limits. Small bowel loops are normal in caliber. Appendix not identified. There is moderate amount of stool in the descending and rectosigmoid colon. There is gaseous distention of the transverse colon. No bowel wall thickening or inflammatory changes. Vascular/Lymphatic: Moderate aortic atherosclerosis. No enlarged abdominal or pelvic lymph nodes. Reproductive: Status post hysterectomy. No adnexal masses. Other: No abdominal wall hernia or abnormality. No abdominopelvic ascites. Musculoskeletal: Multilevel degenerate disc disease with disc height loss and marginal osteophytes. Grade 1 anterolisthesis of L4, unchanged. Levoscoliosis of the lumbar spine. IMPRESSION: 1. Moderate amount of stool in the descending and rectosigmoid colon with gaseous distention of the transverse colon. No evidence of bowel obstruction or inflammatory changes. 2. Moderate aortic atherosclerosis. 3. Multilevel degenerate disc disease with disc height loss and marginal osteophytes. Grade 1 anterolisthesis of L4, unchanged. Levoscoliosis of the lumbar spine. Electronically Signed   By: IKeane PoliceD.O.   On: 04/03/2022 14:46      LOS: 3 days   LFlora Lipps MD [facility] Available via Epic secure chat 7am-7pm After these hours, please refer to coverage provider listed on [email or web address] 04/05/2022, 11:36 AM

## 2022-04-06 ENCOUNTER — Inpatient Hospital Stay: Payer: Medicare Other

## 2022-04-06 DIAGNOSIS — J850 Gangrene and necrosis of lung: Secondary | ICD-10-CM

## 2022-04-06 DIAGNOSIS — E876 Hypokalemia: Secondary | ICD-10-CM | POA: Diagnosis not present

## 2022-04-06 DIAGNOSIS — A419 Sepsis, unspecified organism: Secondary | ICD-10-CM | POA: Diagnosis not present

## 2022-04-06 DIAGNOSIS — D72829 Elevated white blood cell count, unspecified: Secondary | ICD-10-CM | POA: Diagnosis not present

## 2022-04-06 DIAGNOSIS — K219 Gastro-esophageal reflux disease without esophagitis: Secondary | ICD-10-CM | POA: Diagnosis not present

## 2022-04-06 DIAGNOSIS — R531 Weakness: Secondary | ICD-10-CM | POA: Diagnosis not present

## 2022-04-06 DIAGNOSIS — R918 Other nonspecific abnormal finding of lung field: Secondary | ICD-10-CM | POA: Diagnosis not present

## 2022-04-06 LAB — URINE CULTURE: Culture: 60000 — AB

## 2022-04-06 LAB — BASIC METABOLIC PANEL
Anion gap: 10 (ref 5–15)
BUN: 8 mg/dL (ref 8–23)
CO2: 24 mmol/L (ref 22–32)
Calcium: 8 mg/dL — ABNORMAL LOW (ref 8.9–10.3)
Chloride: 105 mmol/L (ref 98–111)
Creatinine, Ser: 0.62 mg/dL (ref 0.44–1.00)
GFR, Estimated: >60 mL/min (ref >60)
Glucose, Bld: 111 mg/dL — ABNORMAL HIGH (ref 70–99)
Potassium: 3.7 mmol/L (ref 3.5–5.1)
Sodium: 139 mmol/L (ref 135–145)

## 2022-04-06 LAB — LEGIONELLA PNEUMOPHILA SEROGP 1 UR AG: L. pneumophila Serogp 1 Ur Ag: NEGATIVE

## 2022-04-06 LAB — MAGNESIUM: Magnesium: 1.9 mg/dL (ref 1.7–2.4)

## 2022-04-06 MED ORDER — SODIUM CHLORIDE 0.9 % IV SOLN: INTRAVENOUS | Status: DC | PRN

## 2022-04-06 MED ORDER — MUPIROCIN 2 % EX OINT
TOPICAL_OINTMENT | Freq: Two times a day (BID) | CUTANEOUS | Status: DC
  Filled 2022-04-06: qty 22

## 2022-04-06 NOTE — Progress Notes (Signed)
   Date of Admission:  04/02/2022     ID: Selena Pittman is a 71 y.o. female Principal Problem:   Pulmonary parenchymal mass Active Problems:   Malignant neoplasm of left female breast ([place])   Sepsis ([place])   Tobacco use   GERD (gastroesophageal reflux disease)   Schizophrenia ([facility])   Leukocytosis   Hypokalemia    Subjective: Says she is not feeling well  Medications:   cholecalciferol  2,000 Units Oral Daily   cloZAPine  400 mg Oral QHS   docusate sodium  100 mg Oral BID   hydrOXYzine  25 mg Oral BID   letrozole  2.5 mg Oral Daily   linaclotide  145 mcg Oral QAC breakfast   montelukast  5 mg Oral QHS   pantoprazole  40 mg Oral Daily   polyethylene glycol  17 g Oral Daily   simvastatin  20 mg Oral Daily    Objective: Vital signs in last 24 hours: Temp:  [99 F (37.2 C)-100.7 F (38.2 C)] 99 F (37.2 C) (12/18 0827) Pulse Rate:  [85-98] 91 (12/18 0827) Resp:  [15-20] 20 (12/18 0827) BP: (102-115)/(72-83) 111/74 (12/18 0827) SpO2:  [94 %-97 %] 95 % (12/18 0827)    PHYSICAL EXAM:  General: Alert, cooperative, no distress,  Lungs: b/l air entry- decreased rt side Heart: Regular rate and rhythm, no murmur, rub or gallop. Abdomen: Soft, non-tender,not distended. Bowel sounds normal. No masses Extremities: atraumatic, no cyanosis. No edema. No clubbing Skin: No rashes or lesions. Or bruising Lymph: Cervical, supraclavicular normal. Neurologic: Grossly non-focal  Lab Results Recent Labs    04/04/22 0456 04/06/22 0258  WBC 11.8*  --   HGB 9.7*  --   HCT 29.7*  --   NA 137 139  K 3.7 3.7  CL 108 105  CO2 24 24  BUN 7* 8  CREATININE 0.46 0.62  Microbiology:  Studies/Results: No results found.   Assessment/Plan: 71 y.o. female with a history of Bipolar disorder, breast ca s.p simple mastectomy left , now on letrazole, presents to the ED on 04/02/22 with weakness, pain abdome? ? Necrotic mass rt lung - this was not present in Aug CXR She does not  have resp symptoms She has bad dentition R/o necrotizing pneumonia- r/o bacterial VS actinomyces/fungal Has MRSA nares positive  Pt currently on linezolid + unasyn Low grade fever   Anemia Poor nutrition   Abdominal pain- CT abdomen shows stool in descending colon and rectum   Uti with klebsiella- unasyn should treat it   H/o ca breast left mastectomy. On letrazole   Bipolar/schizoaffective - on clozapine?  Will discuss with pulmonologist regarding biopsy

## 2022-04-06 NOTE — Evaluation (Signed)
Occupational Therapy Evaluation Patient Details Name: Selena Pittman MRN: [id number] DOB: 08/16/1950 Today's Date: 04/06/2022   History of Present Illness presented to ER secondary to generalized weakness and difficulty with mobility; admitted for management/work up of pulmonary mass (necrosis vs pulmonary abscess vs atypical infection), hypokalemia   Clinical Impression   Ms Ulloa was seen for OT evaluation this date. Prior to hospital admission, pt was mobile at ALF without AD per chart. Pt lives ALF. Pt presents to acute OT demonstrating impaired ADL performance and functional mobility 2/2 decreased activity tolerance and functional strength/ROM/balance deficits. Upon arrival pt noted to have wet bed, agreeable to bed change. Pt currently requires MIN A exit bed, fair sitting balance. MIN A sit<>stand at EOB x2 for clean linen change, poor tolerance and refuses attempts to progress mobility. Pt would benefit from skilled OT to address noted impairments and functional limitations (see below for any additional details). Upon hospital discharge, recommend STR to maximize pt safety and return to PLOF.    Recommendations for follow up therapy are one component of a multi-disciplinary discharge planning process, led by the attending physician.  Recommendations may be updated based on patient status, additional functional criteria and insurance authorization.   Follow Up Recommendations  Skilled nursing-short term rehab (<3 hours/day)     Assistance Recommended at Discharge Frequent or constant Supervision/Assistance  Patient can return home with the following A lot of help with walking and/or transfers;A lot of help with bathing/dressing/bathroom    Functional Status Assessment  Patient has had a recent decline in their functional status and demonstrates the ability to make significant improvements in function in a reasonable and predictable amount of time.  Equipment Recommendations   Other (comment) (defer)    Recommendations for Other Services       Precautions / Restrictions Precautions Precautions: Fall Restrictions Weight Bearing Restrictions: No      Mobility Bed Mobility Overal bed mobility: Needs Assistance Bed Mobility: Supine to Sit, Sit to Supine     Supine to sit: Min assist Sit to supine: Min guard        Transfers Overall transfer level: Needs assistance Equipment used: 1 person hand held assist Transfers: Sit to/from Stand Sit to Stand: Min assist           General transfer comment: poor tolerance      Balance Overall balance assessment: Needs assistance Sitting-balance support: Feet supported, Single extremity supported Sitting balance-Leahy Scale: Fair     Standing balance support: Bilateral upper extremity supported Standing balance-Leahy Scale: Poor                             ADL either performed or assessed with clinical judgement   ADL Overall ADL's : Needs assistance/impaired                                       General ADL Comments: MAX A for LB access seated EOB. SETUP self-drinking seated EOB, single UE support      Pertinent Vitals/Pain Pain Assessment Pain Assessment: Faces Faces Pain Scale: Hurts little more Pain Location: stomach Pain Descriptors / Indicators: Aching, Discomfort Pain Intervention(s): Limited activity within patient's tolerance     Hand Dominance Left   Extremity/Trunk Assessment Upper Extremity Assessment Upper Extremity Assessment: Generalized weakness   Lower Extremity Assessment Lower Extremity Assessment: Generalized weakness   Cervical /  Trunk Assessment Cervical / Trunk Assessment: Kyphotic   Communication Communication Communication: No difficulties   Cognition Arousal/Alertness: Awake/alert Behavior During Therapy: Flat affect Overall Cognitive Status: No family/caregiver present to determine baseline cognitive functioning                                  General Comments: follows 1 step commands      Home Living Family/patient expects to be discharged to:: Assisted living                                        Prior Functioning/Environment Prior Level of Function : Independent/Modified Independent             Mobility Comments: Per TOC documentation, patient ambulatory without assist device at facility. Patient does endorse recent fall (most recent approx 2-3 weeks prior)          OT Problem List: Decreased strength;Decreased range of motion;Decreased activity tolerance;Impaired balance (sitting and/or standing);Decreased safety awareness      OT Treatment/Interventions: Self-care/ADL training;Therapeutic exercise;DME and/or AE instruction;Energy conservation;Therapeutic activities;Patient/family education;Balance training;Cognitive remediation/compensation    OT Goals(Current goals can be found in the care plan section) Acute Rehab OT Goals Patient Stated Goal: to sleep OT Goal Formulation: With patient Time For Goal Achievement: 04/20/22 Potential to Achieve Goals: Good ADL Goals Pt Will Perform Grooming: with modified independence;sitting Pt Will Perform Lower Body Dressing: sit to/from stand;with min assist Pt Will Transfer to Toilet: with min assist;ambulating;bedside commode  OT Frequency: Min 2X/week    Co-evaluation              AM-PAC OT "6 Clicks" Daily Activity     Outcome Measure Help from another person eating meals?: None Help from another person taking care of personal grooming?: A Little Help from another person toileting, which includes using toliet, bedpan, or urinal?: A Lot Help from another person bathing (including washing, rinsing, drying)?: A Lot Help from another person to put on and taking off regular upper body clothing?: A Little Help from another person to put on and taking off regular lower body clothing?: A Lot 6 Click Score:  16   End of Session Nurse Communication: Mobility status  Activity Tolerance: Patient tolerated treatment well Patient left: in bed;with call bell/phone within reach;with bed alarm set  OT Visit Diagnosis: Other abnormalities of gait and mobility (R26.89);Muscle weakness (generalized) (M62.81)                Time: 2876-8115 OT Time Calculation (min): 14 min Charges:  OT General Charges $OT Visit: 1 Visit OT Evaluation $OT Eval Moderate Complexity: 1 Mod  Dessie Coma, M.S. OTR/L  04/06/22, 2:53 PM  ascom [phone]

## 2022-04-06 NOTE — Evaluation (Signed)
Physical Therapy Evaluation Patient Details Name: Glendoris Nodarse MRN: [id number] DOB: 12-Oct-1950 Today's Date: 04/06/2022  History of Present Illness  presented to ER secondary to generalized weakness and difficulty with mobility; admitted for management/work up of pulmonary mass (necrosis vs pulmonary abscess vs atypical infection), hypokalemia  Clinical Impression  Patient resting in bed upon arrival to room; alert and oriented to place, location; follows simple commands, but does requires consistent encouragement from therapist for active effort/participation throughout session (quick to decline otherwise).  Denies localized pain.  Globally weak and deconditioned throughout all extremities; baseline weakness/contracture to R shoulder/hand noted (unchanged with this admission).  Requires act assist for movement of all extremities against gravity; however, limited active effort (significant give-way weakness) noted with mobility efforts.  Currently requiring mod assist for bed mobility; min/mod assist for unsupported sitting balance edge of bed. Maintains very forward flexed, kyphotic posture; L lateral lean with fatigue.  Absent attempts at self-correction; insisting upon return to bed, spontaneously transition self there despite encouragement from therapist.  Additional mobility tasks refused; will continue to assess/progress next session as appropriate. Would benefit from skilled PT to address above deficits and promote optimal return to PLOF.; recommend transition to STR upon discharge from acute hospitalization.        Recommendations for follow up therapy are one component of a multi-disciplinary discharge planning process, led by the attending physician.  Recommendations may be updated based on patient status, additional functional criteria and insurance authorization.  Follow Up Recommendations Skilled nursing-short term rehab (<3 hours/day) Can patient physically be transported by  private vehicle: No    Assistance Recommended at Discharge Frequent or constant Supervision/Assistance  Patient can return home with the following  A lot of help with walking and/or transfers;A lot of help with bathing/dressing/bathroom    Equipment Recommendations    Recommendations for Other Services       Functional Status Assessment Patient has had a recent decline in their functional status and demonstrates the ability to make significant improvements in function in a reasonable and predictable amount of time.     Precautions / Restrictions Precautions Precautions: Fall Restrictions Weight Bearing Restrictions: No      Mobility  Bed Mobility Overal bed mobility: Needs Assistance Bed Mobility: Supine to Sit, Sit to Supine     Supine to sit: Mod assist Sit to supine: Mod assist   General bed mobility comments: limited active effort with movement transition; constant cuing/assist required.  Min/mod assist for unsupported sitting balance once upright    Transfers                   General transfer comment: refused despite max cuing    Ambulation/Gait               General Gait Details: refused despite max cuing  Stairs            Wheelchair Mobility    Modified Rankin (Stroke Patients Only)       Balance Overall balance assessment: Needs assistance Sitting-balance support: No upper extremity supported, Feet supported Sitting balance-Leahy Scale: Poor Sitting balance - Comments: limited attempts at balance correction; min/mod assist from therapist to maintain Postural control: Posterior lean, Left lateral lean     Standing balance comment: refused                             Pertinent Vitals/Pain Pain Assessment Pain Assessment: No/denies pain    Home  Living Family/patient expects to be discharged to:: Assisted living                        Prior Function Prior Level of Function : Independent/Modified  Independent             Mobility Comments: Per TOC documentation, patient ambulatory without assist device at facility. Patient does endorse recent fall (most recent approx 2-3 weeks prior)       Hand Dominance   Dominant Hand: Left    Extremity/Trunk Assessment   Upper Extremity Assessment Upper Extremity Assessment: Generalized weakness (R shoulder elevation 3-/5, R hand contratures (previous orthopedic surgery); L LE grossly WFL)    Lower Extremity Assessment Lower Extremity Assessment:  (grossly 4-/5 throughout; inconsistent active effort with formal assessment and mobility efforts)    Cervical / Trunk Assessment Cervical / Trunk Assessment: Kyphotic  Communication   Communication: No difficulties  Cognition Arousal/Alertness: Awake/alert Behavior During Therapy: Flat affect (generally disengaged with therapist, session) Overall Cognitive Status: No family/caregiver present to determine baseline cognitive functioning                                 General Comments: Patient alert and oriented to self, location; follows simple commands.  Do question higher level orientation at times; difficult to fully assess due to limited participation with interview/assessment        General Comments      Exercises     Assessment/Plan    PT Assessment Patient needs continued PT services  PT Problem List Decreased strength;Decreased activity tolerance;Decreased balance;Decreased range of motion;Decreased mobility;Decreased coordination;Decreased cognition;Decreased knowledge of use of DME;Decreased safety awareness;Decreased knowledge of precautions;Decreased skin integrity       PT Treatment Interventions DME instruction;Functional mobility training;Therapeutic activities;Therapeutic exercise;Balance training;Gait training;Cognitive remediation;Patient/family education    PT Goals (Current goals can be found in the Care Plan section)  Acute Rehab PT  Goals Patient Stated Goal: to lay back down PT Goal Formulation: With patient Time For Goal Achievement: 04/20/22 Potential to Achieve Goals: Fair    Frequency Min 2X/week     Co-evaluation               AM-PAC PT "6 Clicks" Mobility  Outcome Measure Help needed turning from your back to your side while in a flat bed without using bedrails?: A Lot Help needed moving from lying on your back to sitting on the side of a flat bed without using bedrails?: A Lot Help needed moving to and from a bed to a chair (including a wheelchair)?: A Lot Help needed standing up from a chair using your arms (e.g., wheelchair or bedside chair)?: A Lot Help needed to walk in hospital room?: A Lot Help needed climbing 3-5 steps with a railing? : Total 6 Click Score: 11    End of Session   Activity Tolerance:  (generally disinterested with session) Patient left: in bed;with call bell/phone within reach;with bed alarm set Nurse Communication: Mobility status PT Visit Diagnosis: Muscle weakness (generalized) (M62.81);Difficulty in walking, not elsewhere classified (R26.2)    Time: 3009-2330 PT Time Calculation (min) (ACUTE ONLY): 17 min   Charges:   PT Evaluation $PT Eval Moderate Complexity: 1 Mod          Virgina Deakins H. Owens Shark, PT, DPT, NCS 04/06/22, 12:01 PM [phone]

## 2022-04-06 NOTE — Progress Notes (Signed)
PROGRESS NOTE    Selena Pittman  ZWC:[id number] DOB: 03/02/1951 DOA: 04/02/2022 PCP: Remi Haggard, FNP    Brief Narrative:  Selena Pittman is a 71 year old female with history of bipolar disorder,, hyperlipidemia, schizophrenia presented to the hospital with generalized weakness and ambulatory dysfunction.  In the ED patient had stable vitals.  Laboratory data showed potassium of 2.4.  WBC was elevated at 17.0.  COVID and influenza RSV PCR was negative.  Magnesium was 2.1.  Chest x-ray showed large possibly pleural-based mass projecting over the right perihilar region.  CT scan of the chest was recommended which showed a well-circumscribed homogeneous mass anteromedially in the right hemithorax with central low-density suspicious for necrosis. In the ED, patient received 1 L of IV fluid bolus, magnesium sulfate 2 g, potassium and was admitted to the hospital for further evaluation and treatment.  Pulmonary and ID was notified.  At this time patient is awaiting for CT-guided biopsy.  ID has started the patient on broad-spectrum antibiotic.  Assessment and plan.   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  Unable to rule out pulmonary abscess/atypical infection.  Pulmonary and infectious disease on board.  On Unasyn and linezolid. Blood cultures negative in 4 days.  Respiratory viral panel,  COVID and influenza negative as well.  Pending Legionella, Aspergillus, cryptococcal, histoplasma tests.  Procalcitonin was 0.1.  MRSA PCR positive.  HIV was nonreactive.  CRP elevated at 22.4.  Pulmonary has recommended a CT-guided biopsy. Pending CT-guided biopsy   Sepsis Patient met sepsis criteria with elevated respiration rate, heart rate, leukocytosis of 17, possible source as pulmonary abscess. Continue linezolid and Unasyn.  Follow pulmonary recommendations.  ID on board.  Hypokalemia Improved at this time.  Latest potassium of 3.7   Leukocytosis Latest WBC  at 11.8 from 13.6.  Blood cultures negative so far.  Urinalysis abnormal and Klebsiella pneumoniae:.  Will monitor CBC in AM.  Nonspecific abdominal pain, constipation.  CT scan of the abdomen and pelvis with contrast did not show any acute findings but stool in the colon..  Improved today.  Continue MiraLAX and Colace.  Improved at this time.   Schizophrenia ([place]) Continue clozapine.   GERD (gastroesophageal reflux disease) Continue Protonix.   Tobacco use On nicotine patch.  Malignant neoplasm of left female breast Continue letrozole from home.     DVT prophylaxis: Place TED hose Start: 04/02/22 1507   Code Status:     Code Status: Full Code  Disposition:  Assisted living facility likely in 2 to 3 days.  Follow ID and pulmonary recommendation.  Await CT-guided biopsy.  Status is: Inpatient  Remains inpatient appropriate because: IV antibiotic, lung mass, pulmonary and ID follow-up, CT-guided biopsy   Family Communication:  Spoke with the patient's son on the phone and updated him about the clinical condition of the patient.  Consultants:  Pulmonary Infectious disease  Procedures:  None so far  Antimicrobials:  Unasyn and linezolid-  Subjective:  Today, patient was seen and examined at bedside.  Denies any nausea vomiting fever chills or rigor.  Denies any abdominal pain.  Has had a bowel movement.    Objective: Vitals:   04/05/22 1957 04/05/22 2324 04/06/22 0323 04/06/22 0827  BP: 115/83 102/76 114/75 111/74  Pulse: 97 98 85 91  Resp: _0 Temp: 100.2 F (37.9 C) (!) 100.7 F (38.2 C) 99.3 F (37.4 C) 99 F (37.2 C)  TempSrc: Oral Oral Oral Oral  SpO2: 95% 94% 97%  95%  Weight:      Height:        Intake/Output Summary (Last 24 hours) at 04/06/2022 1130 Last data filed at 04/05/2022 1855 Gross per 24 hour  Intake --  Output 1000 ml  Net -1000 ml    Filed Weights   04/02/22 1245  Weight: 55.3 kg    Physical Examination: Body mass  index is 20.93 kg/m.   General: Alert awake and Communicative, thinly built, not in obvious distress.  Thinly built. HENT:   No scleral pallor or icterus noted. Oral mucosa is moist.  Right supraorbital region with laceration.   Chest:    Diminished breath sounds bilaterally.   CVS: S1 &S2 heard. No murmur.  Regular rate and rhythm. Abdomen: Soft, nondistended.  Bowel sounds are heard.   Extremities: No cyanosis, clubbing or edema.  Peripheral pulses are palpable.  Deformity of the right hand noted. Psych: Alert, awake and oriented, poor insight, flat affect. CNS:  No cranial nerve deficits.  Power equal in all extremities.   Skin: Warm and dry.  No rashes noted.  Scalp wart noted.  Right supraorbital region with laceration.  Data Reviewed:   CBC: Recent Labs  Lab 04/02/22 1252 04/03/22 0446 04/04/22 0456  WBC 17.0* 13.6* 11.8*  NEUTROABS 15.1*  --   --   HGB 11.3* 10.3* 9.7*  HCT 34.8* 32.1* 29.7*  MCV 90.4 91.2 89.7  PLT 473* 441* 434*     Basic Metabolic Panel: Recent Labs  Lab 04/02/22 1252 04/02/22 1652 04/03/22 0446 04/04/22 0456 04/06/22 0258  NA 139  --  138 137 139  K 2.4* 3.1* 4.2 3.7 3.7  CL 104  --  106 108 105  CO2 27  --  _0 GLUCOSE 108*  --  130* 111* 111*  BUN 17  --  8 7* 8  CREATININE 0.80  --  0.60 0.46 0.62  CALCIUM 7.8*  --  7.6* 7.5* 8.0*  MG 2.1  --  2.3 2.1 1.9     Liver Function Tests: Recent Labs  Lab 04/02/22 1252  AST 43*  ALT 40  ALKPHOS 88  BILITOT 0.6  PROT 6.6  ALBUMIN 2.6*     Radiology Studies: No results found.    LOS: 4 days   Flora Lipps, MD [facility] Available via Epic secure chat 7am-7pm After these hours, please refer to coverage provider listed on [email or web address] 04/06/2022, 11:30 AM

## 2022-04-06 NOTE — Progress Notes (Addendum)
Mobility Specialist - Progress Note   04/06/22 1000  Mobility  Range of Motion/Exercises All extremities  $Mobility charge 1 Mobility     Pt lying in bed upon arrival, utilizing RA. Reports abdominal pain upon entry. Also reports pain elsewhere but does not specify further. Pt declined OOB mobility despite max education and encouragement. Participated in [place] bed-level therex. Decreased strength and ROM in all extremities with R > L. Does report pain in R shoulder with arm raises. Good grip in L hand---contractures in R hand. Follows commands fairly well. Pt left in bed with alarm set, needs in reach. RN notified.    Kathee Delton Mobility Specialist 04/06/22, 10:53 AM

## 2022-04-06 NOTE — Progress Notes (Signed)
Patient was seen to discuss image-guided lung biopsy with IR team. Patient stated that she was not interested in having a procedure done and that she will be declining the ordered lung biopsy. Please contact IR with any questions or concerns.  Kennieth Francois, PA-C

## 2022-04-06 NOTE — Progress Notes (Addendum)
PULMONOLOGY         Date: 04/06/2022,   MRN# [id number] Selena Pittman December 24, 1950     AdmissionWeight: 55.3 kg                 CurrentWeight: 55.3 kg  Referring provider: Dr. Tobie Poet   CHIEF COMPLAINT:   Lung mass versus abscess   HISTORY OF PRESENT ILLNESS   This is a pleasant 71 year old female with a history of bipolar disorder, history of breast cancer, major depressive disorder, COPD with emphysema, dyslipidemia, osteoporosis, schizophrenia, thyroid nodules, vitamin D deficiency who came in due to undue fatigue and disequilibrium.  She apparently does have some confusion at baseline per family.  She did have a mechanical fall but denies having loss of consciousness or any injury.  She reports being too weak to be ambulatory and has been on bedrest for the last few days.  She does report having cough denies vomiting abdominal pain.  While in the ER she did have vitals done which were essentially normal except for tachypnea in the upper 20s.  She does have SpO2 of over 95% on room air.  She had urinalysis performed in the ER with a large amount of leukocyte Estrace and many bacteria consistent with possible UTI.  Her COVID-19 testing is negative.  CBC shows leukocytosis with a white count of 17,000, chronic anemia with a hemoglobin of 11 with a baseline around 15 and reactive thrombocytosis suggestive of ongoing bleed versus IDA.  CMP was performed with hypokalemia, hypoalbuminemia as well as transaminitis with increased AST. CT chest was performed which was reviewed by me independently with findings of right upper lobe anterior mass which is new from previous study which was abdominal CT scan performed in August with absence of this mass.  04/03/22-  patient seen and examined, remains on room air. Complains of abd pain but improved resp status. Discussed possible biopsy with patient. 04/06/22- patient reports minimal dyspnea.  She is on room air.  Her WBC count has improved  with Unasyn.  She reports ongoing abdominopelvic discomfort. She has abnormal UA suggestive of UTI with Klebsiella + urine culture that is resistant to multiple drugs. This may be cause of abd pain.  She has CT chest and abd done.  She has declined lung mass biopsy.  She seems to be responding to antibiotics so that may be acceptable to re-image later.   PAST MEDICAL HISTORY   Past Medical History:  Diagnosis Date   Bipolar affective ([place])    Breast cancer ([place])    Cancer ([place]) 12-11-14   INVASIVE MAMMARY CARCINOMA/.left breast/ T2 N0   Cardiomegaly    Depression    Emphysema of lung ([place])    Endometriosis    Hyperlipidemia    Osteoporosis    Schizoaffective disorder ([place])    Schizophrenia ([facility])    Thyroid nodule    Vitamin D deficiency      SURGICAL HISTORY   Past Surgical History:  Procedure Laterality Date   ABDOMINAL HYSTERECTOMY     BREAST BIOPSY Left 12-11-14   INVASIVE MAMMARY CARCINOMA.   BREAST LUMPECTOMY WITH SENTINEL LYMPH NODE BIOPSY Left 12/31/2014   Procedure: LEFT BREAST LUMPECTOMY WITH ULTRASOUND GUIDED NEEDLE LOCALIZATION, SENTINEL LYMPH NODE BIOPSY ;  Surgeon: Christene Lye, MD;  Location: [facility] ORS;  Service: General;  Laterality: Left;   DIAGNOSTIC MAMMOGRAM  12/04/2014   Done at [facility] Category 5-Left Breast   DILATION AND CURETTAGE OF UTERUS  MASTECTOMY Left 2016   SIMPLE MASTECTOMY WITH AXILLARY SENTINEL NODE BIOPSY Left 01/14/2015   Procedure: SIMPLE MASTECTOMY;  Surgeon: Christene Lye, MD;  Location: [facility] ORS;  Service: General;  Laterality: Left;   TENDON REPAIR Right    hand   TONSILLECTOMY     TUBAL LIGATION       FAMILY HISTORY   Family History  Problem Relation Age of Onset   Cancer Mother        uterine   Heart attack Father    Breast cancer Neg Hx      SOCIAL HISTORY   Social History   Tobacco Use   Smoking status: Every Day    Packs/day: 0.20    Years: 45.00    Total pack years: 9.00    Types:  Cigarettes   Smokeless tobacco: Never  Vaping Use   Vaping Use: Never used  Substance Use Topics   Alcohol use: No    Alcohol/week: 0.0 standard drinks of alcohol   Drug use: No     MEDICATIONS    Home Medication:     Current Medication:  Current Facility-Administered Medications:    acetaminophen (TYLENOL) tablet 650 mg, 650 mg, Oral, Q6H PRN **OR** acetaminophen (TYLENOL) suppository 650 mg, 650 mg, Rectal, Q6H PRN, Cox, Amy N, DO   Ampicillin-Sulbactam (UNASYN) 3 g in sodium chloride 0.9 % 100 mL IVPB, 3 g, Intravenous, Q6H, Ravishankar, Jayashree, MD, Last Rate: 200 mL/hr at 04/06/22 0457, 3 g at 04/06/22 0457   cholecalciferol (VITAMIN D3) 25 MCG (1000 UNIT) tablet 2,000 Units, 2,000 Units, Oral, Daily, Cox, Amy N, DO, 2,000 Units at 04/06/22 1024   cloZAPine (CLOZARIL) tablet 400 mg, 400 mg, Oral, QHS, Cox, Amy N, DO, 400 mg at 04/05/22 2113   docusate sodium (COLACE) capsule 100 mg, 100 mg, Oral, BID, Pokhrel, Laxman, MD, 100 mg at 04/05/22 2112   hydrOXYzine (ATARAX) tablet 25 mg, 25 mg, Oral, BID, Cox, Amy N, DO, 25 mg at 04/06/22 1024   letrozole [facility]) tablet 2.5 mg, 2.5 mg, Oral, Daily, Cox, Amy N, DO, 2.5 mg at 04/06/22 1024   linaclotide (LINZESS) capsule 145 mcg, 145 mcg, Oral, QAC breakfast, Cox, Amy N, DO, 145 mcg at 04/06/22 1025   linezolid (ZYVOX) IVPB 600 mg, 600 mg, Intravenous, Q12H, Ravishankar, Joellyn Quails, MD, Last Rate: 300 mL/hr at 04/06/22 1127, 600 mg at 04/06/22 1127   montelukast (SINGULAIR) tablet 5 mg, 5 mg, Oral, QHS, Vira Blanco, RPH, 5 mg at 04/05/22 2113   nicotine (NICODERM CQ - dosed in mg/24 hours) patch 21 mg, 21 mg, Transdermal, Daily PRN, Cox, Amy N, DO   ondansetron (ZOFRAN) tablet 4 mg, 4 mg, Oral, Q6H PRN **OR** ondansetron (ZOFRAN) injection 4 mg, 4 mg, Intravenous, Q6H PRN, Cox, Amy N, DO, 4 mg at 04/05/22 1228   oxyCODONE (Oxy IR/ROXICODONE) immediate release tablet 5 mg, 5 mg, Oral, Q6H PRN, Pokhrel, Laxman, MD, 5 mg at 04/04/22  0019   pantoprazole (PROTONIX) EC tablet 40 mg, 40 mg, Oral, Daily, Cox, Amy N, DO, 40 mg at 04/06/22 1024   polyethylene glycol (MIRALAX / GLYCOLAX) packet 17 g, 17 g, Oral, Daily, Pokhrel, Laxman, MD   senna-docusate (Senokot-S) tablet 1 tablet, 1 tablet, Oral, QHS PRN, Cox, Amy N, DO   simvastatin (ZOCOR) tablet 20 mg, 20 mg, Oral, Daily, Cox, Amy N, DO, 20 mg at 04/06/22 1024    ALLERGIES   Cat hair extract, Milk-related compounds, Mixed ragweed, and Peanut-containing drug products  REVIEW OF SYSTEMS    Review of Systems:  Gen:  Denies  fever, sweats, chills weigh loss  HEENT: Denies blurred vision, double vision, ear pain, eye pain, hearing loss, nose bleeds, sore throat Cardiac:  No dizziness, chest pain or heaviness, chest tightness,edema Resp:   reports dyspnea chronically  Gi: Denies swallowing difficulty, stomach pain, nausea or vomiting, diarrhea, constipation, bowel incontinence Gu:  Denies bladder incontinence, burning urine Ext:   Denies Joint pain, stiffness or swelling Skin: Denies  skin rash, easy bruising or bleeding or hives Endoc:  Denies polyuria, polydipsia , polyphagia or weight change Psych:   Denies depression, insomnia or hallucinations   Other:  All other systems negative   VS: BP 104/70 (BP Location: Left Arm)   Pulse 91   Temp 98.7 F (37.1 C) (Oral)   Resp 18   Ht '5\' 4"'$  (1.626 m)   Wt 55.3 kg   SpO2 95%   BMI 20.93 kg/m      PHYSICAL EXAM    GENERAL:NAD, no fevers, chills, no weakness no fatigue HEAD: Normocephalic, atraumatic.  EYES: Pupils equal, round, reactive to light. Extraocular muscles intact. No scleral icterus.  MOUTH: Moist mucosal membrane. Dentition intact. No abscess noted.  EAR, NOSE, THROAT: Clear without exudates. No external lesions.  NECK: Supple. No thyromegaly. No nodules. No JVD.  PULMONARY: decreased breath sounds with mild rhonchi worse at bases bilaterally.  CARDIOVASCULAR: S1 and S2. Regular rate and  rhythm. No murmurs, rubs, or gallops. No edema. Pedal pulses 2+ bilaterally.  GASTROINTESTINAL: Soft, nontender, nondistended. No masses. Positive bowel sounds. No hepatosplenomegaly.  MUSCULOSKELETAL: No swelling, clubbing, or edema. Range of motion full in all extremities.  NEUROLOGIC: Cranial nerves II through XII are intact. No gross focal neurological deficits. Sensation intact. Reflexes intact.  SKIN: No ulceration, lesions, rashes, or cyanosis. Skin warm and dry. Turgor intact.  PSYCHIATRIC: Mood, affect within normal limits. The patient is awake, alert and oriented x 3. Insight, judgment intact.       IMAGING     ASSESSMENT/PLAN   Large anterior right lung mass -Appears to be new since August  -Patient does have moderate pretest probability for lung cancer -Due to elevated WBC count would empirically treat for possible abscess -Patient does have urinalysis suggestive of UTI and may have leukocytosis related to that as well -Currently the patient is on Unasyn and vancomycin there has been consultation placed for infectious disease-input is appreciated. -Will order respiratory culture -Procalcitonin trend -MRSA PCR has been done would recommend de-escalation of vancomycin if negative -Blood cultures x 2 have been ordered -Aspiratory viral panel should be also done -Additional etiologies include fungus and aspergilloma, will perform Aspergillus antibodies via serology -Possible histoplasma exposure will perform galactomannan testing as well as urine histoplasma antigen -Due to mental illness and COPD will also obtain cryptococcal antigen -Sputum cytology for possible malignancy -Likely will need PET scan on outpatient basis -Due to altered mental status may consider MRI brain to rule out metastatic lesions   Thank you for allowing me to participate in the care of this patient.  Patient/Family are satisfied with care plan and all questions have been answered.    Provider  disclosure: Patient with at least one acute or chronic illness or injury that poses a threat to life or bodily function and is being managed actively during this encounter.  All of the below services have been performed independently by signing provider:  review of prior documentation from internal and or external health  records.  Review of previous and current lab results.  Interview and comprehensive assessment during patient visit today. Review of current and previous chest radiographs/CT scans. Discussion of management and test interpretation with health care team and patient/family.   This document was prepared using Dragon voice recognition software and may include unintentional dictation errors.     Ottie Glazier, M.D.  Division of Pulmonary & Critical Care Medicine

## 2022-04-07 DIAGNOSIS — R918 Other nonspecific abnormal finding of lung field: Secondary | ICD-10-CM | POA: Diagnosis not present

## 2022-04-07 DIAGNOSIS — E876 Hypokalemia: Secondary | ICD-10-CM | POA: Diagnosis not present

## 2022-04-07 DIAGNOSIS — A419 Sepsis, unspecified organism: Secondary | ICD-10-CM | POA: Diagnosis not present

## 2022-04-07 DIAGNOSIS — J850 Gangrene and necrosis of lung: Secondary | ICD-10-CM | POA: Diagnosis not present

## 2022-04-07 DIAGNOSIS — R531 Weakness: Secondary | ICD-10-CM | POA: Diagnosis not present

## 2022-04-07 DIAGNOSIS — D72829 Elevated white blood cell count, unspecified: Secondary | ICD-10-CM | POA: Diagnosis not present

## 2022-04-07 DIAGNOSIS — K219 Gastro-esophageal reflux disease without esophagitis: Secondary | ICD-10-CM | POA: Diagnosis not present

## 2022-04-07 LAB — CBC WITH DIFFERENTIAL/PLATELET
Abs Immature Granulocytes: 0.15 10*3/uL — ABNORMAL HIGH (ref 0.00–0.07)
Basophils Absolute: 0 10*3/uL (ref 0.0–0.1)
Basophils Relative: 0 %
Eosinophils Absolute: 0.1 10*3/uL (ref 0.0–0.5)
Eosinophils Relative: 1 %
HCT: 32 % — ABNORMAL LOW (ref 36.0–46.0)
Hemoglobin: 10.3 g/dL — ABNORMAL LOW (ref 12.0–15.0)
Immature Granulocytes: 1 %
Lymphocytes Relative: 5 %
Lymphs Abs: 0.7 10*3/uL (ref 0.7–4.0)
MCH: 28.9 pg (ref 26.0–34.0)
MCHC: 32.2 g/dL (ref 30.0–36.0)
MCV: 89.9 fL (ref 80.0–100.0)
Monocytes Absolute: 0.7 10*3/uL (ref 0.1–1.0)
Monocytes Relative: 6 %
Neutro Abs: 10.5 10*3/uL — ABNORMAL HIGH (ref 1.7–7.7)
Neutrophils Relative %: 87 %
Platelets: 424 10*3/uL — ABNORMAL HIGH (ref 150–400)
RBC: 3.56 MIL/uL — ABNORMAL LOW (ref 3.87–5.11)
RDW: 13 % (ref 11.5–15.5)
WBC: 12.1 10*3/uL — ABNORMAL HIGH (ref 4.0–10.5)
nRBC: 0 % (ref 0.0–0.2)

## 2022-04-07 LAB — MAGNESIUM: Magnesium: 2 mg/dL (ref 1.7–2.4)

## 2022-04-07 LAB — BASIC METABOLIC PANEL
Anion gap: 9 (ref 5–15)
BUN: 10 mg/dL (ref 8–23)
CO2: 25 mmol/L (ref 22–32)
Calcium: 8.2 mg/dL — ABNORMAL LOW (ref 8.9–10.3)
Chloride: 104 mmol/L (ref 98–111)
Creatinine, Ser: 0.59 mg/dL (ref 0.44–1.00)
GFR, Estimated: >60 mL/min (ref >60)
Glucose, Bld: 127 mg/dL — ABNORMAL HIGH (ref 70–99)
Potassium: 4.1 mmol/L (ref 3.5–5.1)
Sodium: 138 mmol/L (ref 135–145)

## 2022-04-07 LAB — ASPERGILLUS ANTIGEN, BAL/SERUM: Aspergillus Ag, BAL/Serum: 0.04 Index (ref 0.00–0.49)

## 2022-04-07 LAB — CULTURE, BLOOD (ROUTINE X 2)
Culture: NO GROWTH
Culture: NO GROWTH
Special Requests: ADEQUATE

## 2022-04-07 LAB — CRYPTOCOCCUS ANTIGEN, SERUM: Cryptococcus Antigen, Serum: NEGATIVE

## 2022-04-07 NOTE — Progress Notes (Signed)
Mobility Specialist - Progress Note   04/07/22 1100  Mobility  Activity Stood at bedside;Dangled on edge of bed  Level of Assistance Maximum assist, patient does 25-49%  Assistive Device Other (Comment)  Distance Ambulated (ft) 0 ft  Activity Response Tolerated fair  $Mobility charge 1 Mobility     Pt sleeping in bed upon arrival, utilizing RA. Pt awakened by voice, breakfast tray arriving beginning of session. Encouraged pt to transfer to chair for meal, pt initially agreeable. Pt sat EOB with maxA. Limited active participation. Poor sitting balance with significant post lean, maxA for balance. Noted jerking movements this date while sitting, pt reports this is new for her. Attempted STS and mid-transfer pt becomes anxious and adamantly requests to return to bed. Pt returned to EOB and refused further activity despite encouragement. Pt returned semi-supine with alarm set, needs in reach. Assisted with breakfast tray set-up prior to exit. RN notified.    Kathee Delton Mobility Specialist 04/07/22, 11:32 AM

## 2022-04-07 NOTE — TOC Initial Note (Signed)
Transition of Care [facility]) - Initial/Assessment Note    Patient Details  Name: Selena Pittman MRN: [id number] Date of Birth: 07/14/1950  [facility]) CM/SW Contact:    Tiburcio Bash, LCSW Phone Number: 04/07/2022, 11:32 AM  Clinical Narrative:                  CSW spoke with patient's son [name] regarding dc planning. He reports patient is from [facility] however the facility is closing the end of this year.   Reports he has found patient a bed at [facility]. Reports the contact for facility is Devin Going at [phone]. Reports plan at dc is for patient to go there. Aware of SNF rec however barriers in medicaid check needing to go to family care home for bed instead of snf.   CSW has reached out to [facility] to identify if they can assess patient to ensure they can meet her current needs at dc. She reports they will attempt to come by sometime today to assess patient's mobility and their ability to meet her needs at dc.   Expected Discharge Plan:  ([place]) Barriers to Discharge: Continued Medical Work up   Patient Goals and CMS Choice Patient states their goals for this hospitalization and ongoing recovery are:: to go home CMS Medicare.gov Compare Post Acute Care list provided to:: Patient Choice offered to / list presented to : Patient  Expected Discharge Plan and Services Expected Discharge Plan:  ([place])       Living arrangements for the past 2 months: [place] ([place])                                      Prior Living Arrangements/Services Living arrangements for the past 2 months: [place] ([place]) Lives with:: Facility Resident              Current home services: DME    Activities of Daily Living Home Assistive Devices/Equipment: None ADL Screening (condition at time of admission) Patient's cognitive ability adequate to safely  complete daily activities?: Yes Is the patient deaf or have difficulty hearing?: No Does the patient have difficulty seeing, even when wearing glasses/contacts?: No Does the patient have difficulty concentrating, remembering, or making decisions?: No Patient able to express need for assistance with ADLs?: Yes Does the patient have difficulty dressing or bathing?: Yes Independently performs ADLs?: No Toileting: Needs assistance Does the patient have difficulty walking or climbing stairs?: Yes Weakness of Legs: Both Weakness of Arms/Hands: None  Permission Sought/Granted                  Emotional Assessment       Orientation: : Fluctuating Orientation (Suspected and/or reported Sundowners) Alcohol / Substance Use: Not Applicable Psych Involvement: No (comment)  Admission diagnosis:  Lung mass [R91.8] Generalized weakness [R53.1] Sepsis ([place]) [A41.9] Patient Active Problem List   Diagnosis Date Noted   Necrotizing pneumonia ([place]) 04/06/2022   Sepsis ([place]) 04/02/2022   Tobacco use 04/02/2022   GERD (gastroesophageal reflux disease) 04/02/2022   Schizophrenia ([place]) 04/02/2022   Leukocytosis 04/02/2022   Hypokalemia 04/02/2022   Lung mass 04/02/2022   Malignant neoplasm of left female breast ([place]) 06/05/2015   PCP:  Remi Haggard, FNP Pharmacy:   [place], [address]  [place] [address] Phone: [phone] Fax: [phone]  [place], [place] - [place] [address] [place] [address] Phone: [phone] Fax: [phone]     Social Determinants of Health (SDOH) Interventions    Readmission Risk Interventions     No data to display

## 2022-04-07 NOTE — Progress Notes (Signed)
   Date of Admission:  04/02/2022     ID: Selena Pittman is a 71 y.o. female Principal Problem:   Lung mass Active Problems:   Malignant neoplasm of left female breast (Selena Pittman)   Sepsis ([place])   Tobacco use   GERD (gastroesophageal reflux disease)   Schizophrenia ([facility])   Leukocytosis   Hypokalemia   Necrotizing pneumonia ([facility])    Subjective: Refusing lung biopsy Says it will be painful  Medications:   cholecalciferol  2,000 Units Oral Daily   cloZAPine  400 mg Oral QHS   docusate sodium  100 mg Oral BID   hydrOXYzine  25 mg Oral BID   letrozole  2.5 mg Oral Daily   linaclotide  145 mcg Oral QAC breakfast   montelukast  5 mg Oral QHS   mupirocin ointment   Nasal BID   pantoprazole  40 mg Oral Daily   polyethylene glycol  17 g Oral Daily   simvastatin  20 mg Oral Daily    Objective: Vital signs in last 24 hours: Temp:  [98.3 F (36.8 C)-99.9 F (37.7 C)] 98.3 F (36.8 C) (12/19 1152) Pulse Rate:  [93-97] 94 (12/19 1152) Resp:  [16-20] 16 (12/19 1152) BP: (107-114)/(70-75) 109/75 (12/19 1152) SpO2:  [95 %-97 %] 97 % (12/19 1152)    PHYSICAL EXAM:  General: Alert, cooperative, no distress,  Scalp seborrheic keratosis Lungs: b/l air entry- decreased rt side Heart: Regular rate and rhythm, no murmur, rub or gallop. Abdomen: Soft, non-tender,not distended. Bowel sounds normal. No masses Extremities: atraumatic, no cyanosis. No edema. No clubbing Skin: No rashes or lesions. Or bruising Lymph: Cervical, supraclavicular normal. Neurologic: Grossly non-focal  Lab Results Recent Labs    04/06/22 0258 04/07/22 0437  WBC  --  12.1*  HGB  --  10.3*  HCT  --  32.0*  NA 139 138  K 3.7 4.1  CL 105 104  CO2 24 25  BUN 8 10  CREATININE 0.62 0.59    Studies/Results:  Rt upper lobe mass with central necrosis   Assessment/Plan: 71 y.o. female with a history of Bipolar disorder, breast ca s.p simple mastectomy left , now on letrazole, presents to the ED on  04/02/22 with weakness, pain abdome? ? Necrotic mass rt lung - this was not present in Aug CXR She does not have resp symptoms She has bad dentition R/o necrotizing pneumonia- r/o bacterial VS actinomyces/fungal R/o malignancy Has MRSA nares positive  Pt currently on linezolid + unasyn Low grade fever  She is refusing lung biopsy Would have to talk to her son  Anemia Poor nutrition   Abdominal pain- CT abdomen shows stool in descending colon and rectum   Uti with klebsiella- unasyn should treat it   H/o ca breast left mastectomy. On letrazole   Bipolar/schizoaffective - on clozapine?  Will discuss with care team

## 2022-04-07 NOTE — Progress Notes (Signed)
PT Cancellation Note  Patient Details Name: Selena Pittman MRN: [id number] DOB: April 12, 1951   Cancelled Treatment:    Reason Eval/Treat Not Completed:  (Treatment session attempted.  Patient sleeping upon arrival.  Immediately upon waking, voicing "no!", "don't do that" and "no, no, no!" to therapist with any attempts at interaction (bed adjustment, moving covers).  Refused participation; unable to redirect.  Will continue efforts next date as appropriate and available.)   Doristine Shehan H. Owens Shark, PT, DPT, NCS 04/07/22, 4:48 PM [phone]

## 2022-04-07 NOTE — Progress Notes (Signed)
PROGRESS NOTE    Selena Pittman  ZDG:[id number] DOB: 05-28-1950 DOA: 04/02/2022 PCP: Remi Haggard, FNP    Brief Narrative:  [name] is a 71 year old female with history of bipolar disorder,, hyperlipidemia, schizophrenia presented to the hospital with generalized weakness and ambulatory dysfunction.  In the ED, patient had stable vitals.  Laboratory data showed potassium of 2.4.  WBC was elevated at 17.0.  COVID and influenza RSV PCR was negative.  Magnesium was 2.1.  Chest x-ray showed large possibly pleural-based mass projecting over the right perihilar region.  CT scan of the chest was recommended which showed a well-circumscribed homogeneous mass anteromedially in the right hemithorax with central low-density suspicious for necrosis. In the ED, patient received 1 L of IV fluid bolus, magnesium sulfate 2 g, potassium and was admitted to the hospital for further evaluation and treatment.  Pulmonary and ID was notified.  CT-guided biopsy was planned but patient has been refusing it..  ID has started the patient on broad-spectrum antibiotic.  At this time pulmonary is treating for possible pulmonary abscess.  Physical therapy has seen the patient and recommending skilled nursing facility.  Assessment and plan.   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 Right-sided lesion with central necrosis.  Unable to rule out pulmonary abscess/atypical infection.  Pulmonary and infectious disease on board.  On Unasyn and linezolid. Blood cultures negative in 5 days.  Respiratory viral panel,  COVID and influenza negative as well.  Aspergillus antibody negative.  Urinary histoplasma antigen negative.  Cryptococcal antigen negative.  MRSA PCR positive.  HIV was nonreactive.  CRP elevated at 22.4.  Pulmonary had initially recommended CT-guided biopsy but at this time since patient has refused this has been reported.  Patient will likely need PET scan as outpatient.   Sepsis  secondary to pulmonary infection. Patient met sepsis criteria with elevated respiration rate, heart rate, leukocytosis of 17, possible source as pulmonary abscess. Continue linezolid and Unasyn.  Follow pulmonary and ID recommendations.  Hypokalemia Improved at this time.  Latest potassium of 4.1.   Leukocytosis Latest WBC at 12.1 from 11.8 <13.6.  Blood cultures negative so far.  Urinalysis abnormal and showed insignificant colony of Klebsiella pneumoniae: Is already on antibiotic.  Will monitor CBC in AM.  Nonspecific abdominal pain, constipation.  CT scan of the abdomen and pelvis with contrast did not show any acute findings but stool in the colon. Improved today.  Continue MiraLAX and Colace.  Had bowel movements.   Schizophrenia ([place]) Continue clozapine.   GERD (gastroesophageal reflux disease) Continue Protonix.   Tobacco use On nicotine patch.  Malignant neoplasm of left female breast Continue letrozole from home.     DVT prophylaxis: Place TED hose Start: 04/02/22 1507   Code Status:     Code Status: Full Code  Disposition:  Assisted living facility likely in 2 to 3 days.  Follow ID and pulmonary recommendation.    Status is: Inpatient  Remains inpatient appropriate because: IV antibiotic, lung mass, pulmonary and ID follow-up,    Family Communication:  Spoke with the patient's son on the phone and updated him about the clinical condition of the patient on 04/06/2022.  Consultants:  Pulmonary Infectious disease  Procedures:  None so far  Antimicrobials:  Unasyn and linezolid-  Subjective:  Today, patient was seen and examined at bedside.  Patient denies any vomiting but feels slightly sick in her stomach.  Denies fever chills chest pain hemoptysis or sputum production.  Has had a bowel movement.  Objective: Vitals:   04/07/22 0110 04/07/22 0430 04/07/22 0801 04/07/22 1152  BP: 109/73 107/70 114/75 109/75  Pulse: 93 95 97 94  Resp: _0 Temp: 98.7 F (37.1 C) 99.7 F (37.6 C) 99.9 F (37.7 C) 98.3 F (36.8 C)  TempSrc: Oral Oral    SpO2: 96% 95% 96% 97%  Weight:      Height:        Intake/Output Summary (Last 24 hours) at 04/07/2022 1508 Last data filed at 04/07/2022 0508 Gross per 24 hour  Intake --  Output 1650 ml  Net -1650 ml    Filed Weights   04/02/22 1245  Weight: 55.3 kg    Physical Examination: Body mass index is 20.93 kg/m.   General:  Average built, not in obvious distress, thinly built, alert awake and Communicative, HENT:   No scleral pallor or icterus noted. Oral mucosa is moist.  Supraorbital region with laceration. Chest:  .  Diminished breath sounds bilaterally.  CVS: S1 &S2 heard. No murmur.  Regular rate and rhythm. Abdomen: Soft, nontender, nondistended.  Bowel sounds are heard.   Extremities: No cyanosis, clubbing or edema.  Peripheral pulses are palpable.  Deformity of the right hand Psych: Alert, awake and oriented, poor insight, flat affect CNS:  No cranial nerve deficits.  Power equal in all extremities.   Skin: Warm and dry.  Scalp wart noted, right supra orbital region with laceration.  Data Reviewed:   CBC: Recent Labs  Lab 04/02/22 1252 04/03/22 0446 04/04/22 0456 04/07/22 0437  WBC 17.0* 13.6* 11.8* 12.1*  NEUTROABS 15.1*  --   --  10.5*  HGB 11.3* 10.3* 9.7* 10.3*  HCT 34.8* 32.1* 29.7* 32.0*  MCV 90.4 91.2 89.7 89.9  PLT 473* 441* 434* 424*     Basic Metabolic Panel: Recent Labs  Lab 04/02/22 1252 04/02/22 1652 04/03/22 0446 04/04/22 0456 04/06/22 0258 04/07/22 0437  NA 139  --  138 137 139 138  K 2.4* 3.1* 4.2 3.7 3.7 4.1  CL 104  --  106 108 105 104  CO2 27  --  _1 GLUCOSE 108*  --  130* 111* 111* 127*  BUN 17  --  8 7* 8 10  CREATININE 0.80  --  0.60 0.46 0.62 0.59  CALCIUM 7.8*  --  7.6* 7.5* 8.0* 8.2*  MG 2.1  --  2.3 2.1 1.9 2.0     Liver Function Tests: Recent Labs  Lab 04/02/22 1252  AST 43*  ALT 40  ALKPHOS 88   BILITOT 0.6  PROT 6.6  ALBUMIN 2.6*     Radiology Studies: No results found.    LOS: 5 days   Flora Lipps, MD [facility] Available via Epic secure chat 7am-7pm After these hours, please refer to coverage provider listed on [email or web address] 04/07/2022, 3:08 PM

## 2022-04-07 NOTE — Progress Notes (Signed)
Lvm with son to discuss snf rec as patient is slightly disoriented.   Pending call back.   [place] Fam, [place], MSW, [place]

## 2022-04-07 NOTE — Progress Notes (Signed)
PULMONOLOGY         Date: 04/07/2022,   MRN# [id number] Selena Pittman [birth year]     AdmissionWeight: 55.3 kg                 CurrentWeight: 55.3 kg  Referring provider: Dr. Tobie Poet   CHIEF COMPLAINT:   Lung mass versus abscess   HISTORY OF PRESENT ILLNESS   This is a pleasant 71 year old female with a history of bipolar disorder, history of breast cancer, major depressive disorder, COPD with emphysema, dyslipidemia, osteoporosis, schizophrenia, thyroid nodules, vitamin D deficiency who came in due to undue fatigue and disequilibrium.  She apparently does have some confusion at baseline per family.  She did have a mechanical fall but denies having loss of consciousness or any injury.  She reports being too weak to be ambulatory and has been on bedrest for the last few days.  She does report having cough denies vomiting abdominal pain.  While in the ER she did have vitals done which were essentially normal except for tachypnea in the upper 20s.  She does have SpO2 of over 95% on room air.  She had urinalysis performed in the ER with a large amount of leukocyte Estrace and many bacteria consistent with possible UTI.  Her COVID-19 testing is negative.  CBC shows leukocytosis with a white count of 17,000, chronic anemia with a hemoglobin of 11 with a baseline around 15 and reactive thrombocytosis suggestive of ongoing bleed versus IDA.  CMP was performed with hypokalemia, hypoalbuminemia as well as transaminitis with increased AST. CT chest was performed which was reviewed by me independently with findings of right upper lobe anterior mass which is new from previous study which was abdominal CT scan performed in August with absence of this mass.  04/03/22-  patient seen and examined, remains on room air. Complains of abd pain but improved resp status. Discussed possible biopsy with patient. 04/06/22- patient reports minimal dyspnea.  She is on room air.  Her WBC count has improved  with Unasyn.  She reports ongoing abdominopelvic discomfort. She has abnormal UA suggestive of UTI with Klebsiella + urine culture that is resistant to multiple drugs. This may be cause of abd pain.  She has CT chest and abd done.  She has declined lung mass biopsy.  She seems to be responding to antibiotics so that may be acceptable to re-image later.  04/07/22- patient is stable, laying in bed comfortably.  She reports improvement in abdominal pain.  Very weak and deconditioned clinically.  Blood work essentially stable from prior with mild uptrend in wbc count. She remains on zyvox unasyn combination. Still refusing lung biopsy at this time. Will continue to follow.   PAST MEDICAL HISTORY   Past Medical History:  Diagnosis Date   Bipolar affective ([place])    Breast cancer ([place])    Cancer ([place]) 12-11-14   INVASIVE MAMMARY CARCINOMA/.left breast/ T2 N0   Cardiomegaly    Depression    Emphysema of lung ([place])    Endometriosis    Hyperlipidemia    Osteoporosis    Schizoaffective disorder ([place])    Schizophrenia ([facility])    Thyroid nodule    Vitamin D deficiency      SURGICAL HISTORY   Past Surgical History:  Procedure Laterality Date   ABDOMINAL HYSTERECTOMY     BREAST BIOPSY Left 12-11-14   INVASIVE MAMMARY CARCINOMA.   BREAST LUMPECTOMY WITH SENTINEL LYMPH NODE BIOPSY Left 12/31/2014   Procedure: LEFT BREAST  LUMPECTOMY WITH ULTRASOUND GUIDED NEEDLE LOCALIZATION, SENTINEL LYMPH NODE BIOPSY ;  Surgeon: Christene Lye, MD;  Location: [facility] ORS;  Service: General;  Laterality: Left;   DIAGNOSTIC MAMMOGRAM  12/04/2014   Done at [facility] Imaging Category 5-Left Breast   DILATION AND CURETTAGE OF UTERUS     MASTECTOMY Left 2016   SIMPLE MASTECTOMY WITH AXILLARY SENTINEL NODE BIOPSY Left 01/14/2015   Procedure: SIMPLE MASTECTOMY;  Surgeon: Christene Lye, MD;  Location: [facility] ORS;  Service: General;  Laterality: Left;   TENDON REPAIR Right    hand   TONSILLECTOMY     TUBAL LIGATION        FAMILY HISTORY   Family History  Problem Relation Age of Onset   Cancer Mother        uterine   Heart attack Father    Breast cancer Neg Hx      SOCIAL HISTORY   Social History   Tobacco Use   Smoking status: Every Day    Packs/day: 0.20    Years: 45.00    Total pack years: 9.00    Types: Cigarettes   Smokeless tobacco: Never  Vaping Use   Vaping Use: Never used  Substance Use Topics   Alcohol use: No    Alcohol/week: 0.0 standard drinks of alcohol   Drug use: No     MEDICATIONS    Home Medication:     Current Medication:  Current Facility-Administered Medications:    0.9 %  sodium chloride infusion, , Intravenous, PRN, Pokhrel, Laxman, MD, Last Rate: 10 mL/hr at 04/06/22 2115, New Bag at 04/06/22 2115   acetaminophen (TYLENOL) tablet 650 mg, 650 mg, Oral, Q6H PRN **OR** acetaminophen (TYLENOL) suppository 650 mg, 650 mg, Rectal, Q6H PRN, Cox, Amy N, DO   Ampicillin-Sulbactam (UNASYN) 3 g in sodium chloride 0.9 % 100 mL IVPB, 3 g, Intravenous, Q6H, Ravishankar, Jayashree, MD, Last Rate: 200 mL/hr at 04/07/22 1219, 3 g at 04/07/22 1219   cholecalciferol (VITAMIN D3) 25 MCG (1000 UNIT) tablet 2,000 Units, 2,000 Units, Oral, Daily, Cox, Amy N, DO, 2,000 Units at 04/07/22 0851   cloZAPine (CLOZARIL) tablet 400 mg, 400 mg, Oral, QHS, Cox, Amy N, DO, 400 mg at 04/06/22 2106   docusate sodium (COLACE) capsule 100 mg, 100 mg, Oral, BID, Pokhrel, Laxman, MD, 100 mg at 04/07/22 0851   hydrOXYzine (ATARAX) tablet 25 mg, 25 mg, Oral, BID, Cox, Amy N, DO, 25 mg at 04/07/22 0852   letrozole [facility]) tablet 2.5 mg, 2.5 mg, Oral, Daily, Cox, Amy N, DO, 2.5 mg at 04/07/22 0858   linaclotide (LINZESS) capsule 145 mcg, 145 mcg, Oral, QAC breakfast, Cox, Amy N, DO, 145 mcg at 04/07/22 0853   linezolid (ZYVOX) IVPB 600 mg, 600 mg, Intravenous, Q12H, Ravishankar, Joellyn Quails, MD, Last Rate: 300 mL/hr at 04/07/22 0853, 600 mg at 04/07/22 0853   montelukast (SINGULAIR) tablet 5 mg, 5  mg, Oral, QHS, Vira Blanco, RPH, 5 mg at 04/06/22 2106   mupirocin ointment (BACTROBAN) 2 %, , Nasal, BID, Pokhrel, Laxman, MD, Given at 04/07/22 0858   nicotine (NICODERM CQ - dosed in mg/24 hours) patch 21 mg, 21 mg, Transdermal, Daily PRN, Cox, Amy N, DO, 21 mg at 04/07/22 0853   ondansetron (ZOFRAN) tablet 4 mg, 4 mg, Oral, Q6H PRN **OR** ondansetron (ZOFRAN) injection 4 mg, 4 mg, Intravenous, Q6H PRN, Cox, Amy N, DO, 4 mg at 04/06/22 1531   oxyCODONE (Oxy IR/ROXICODONE) immediate release tablet 5 mg, 5 mg, Oral, Q6H PRN,  Pokhrel, Laxman, MD, 5 mg at 04/07/22 1113   pantoprazole (PROTONIX) EC tablet 40 mg, 40 mg, Oral, Daily, Cox, Amy N, DO, 40 mg at 04/07/22 0851   polyethylene glycol (MIRALAX / GLYCOLAX) packet 17 g, 17 g, Oral, Daily, Pokhrel, Laxman, MD, 17 g at 04/07/22 7619   senna-docusate (Senokot-S) tablet 1 tablet, 1 tablet, Oral, QHS PRN, Cox, Amy N, DO   simvastatin (ZOCOR) tablet 20 mg, 20 mg, Oral, Daily, Cox, Amy N, DO, 20 mg at 04/07/22 0851    ALLERGIES   Cat hair extract, Milk-related compounds, Mixed ragweed, and Peanut-containing drug products     REVIEW OF SYSTEMS    Review of Systems:  Gen:  Denies  fever, sweats, chills weigh loss  HEENT: Denies blurred vision, double vision, ear pain, eye pain, hearing loss, nose bleeds, sore throat Cardiac:  No dizziness, chest pain or heaviness, chest tightness,edema Resp:   reports dyspnea chronically  Gi: Denies swallowing difficulty, stomach pain, nausea or vomiting, diarrhea, constipation, bowel incontinence Gu:  Denies bladder incontinence, burning urine Ext:   Denies Joint pain, stiffness or swelling Skin: Denies  skin rash, easy bruising or bleeding or hives Endoc:  Denies polyuria, polydipsia , polyphagia or weight change Psych:   Denies depression, insomnia or hallucinations   Other:  All other systems negative   VS: BP 109/75 (BP Location: Left Arm)   Pulse 94   Temp 98.3 F (36.8 C)   Resp 16   Ht  '5\' 4"'$  (1.626 m)   Wt 55.3 kg   SpO2 97%   BMI 20.93 kg/m      PHYSICAL EXAM    GENERAL:NAD, no fevers, chills, no weakness no fatigue HEAD: Normocephalic, atraumatic.  EYES: Pupils equal, round, reactive to light. Extraocular muscles intact. No scleral icterus.  MOUTH: Moist mucosal membrane. Dentition intact. No abscess noted.  EAR, NOSE, THROAT: Clear without exudates. No external lesions.  NECK: Supple. No thyromegaly. No nodules. No JVD.  PULMONARY: decreased breath sounds with mild rhonchi worse at bases bilaterally.  CARDIOVASCULAR: S1 and S2. Regular rate and rhythm. No murmurs, rubs, or gallops. No edema. Pedal pulses 2+ bilaterally.  GASTROINTESTINAL: Soft, nontender, nondistended. No masses. Positive bowel sounds. No hepatosplenomegaly.  MUSCULOSKELETAL: No swelling, clubbing, or edema. Range of motion full in all extremities.  NEUROLOGIC: Cranial nerves II through XII are intact. No gross focal neurological deficits. Sensation intact. Reflexes intact.  SKIN: No ulceration, lesions, rashes, or cyanosis. Skin warm and dry. Turgor intact.  PSYCHIATRIC: Mood, affect within normal limits. The patient is awake, alert and oriented x 3. Insight, judgment intact.       IMAGING     ASSESSMENT/PLAN   Large anterior right lung mass -Appears to be new since August  -Patient does have moderate pretest probability for lung cancer -Treating as abscess at this moment -Consultation infectious disease-input is appreciated. -Unable to produce respiratory culture -Procalcitonin trend is negative  -MRSA PCR -positive -Blood cultures x 2 have been ordered- NTD -Aspiratory viral panel- negative  -Aspergillus ab - negative -urine histoplasma antigen-negative -cryptococcal antigen-negative -Sputum cytology for possible malignancy -Likely will need PET scan on outpatient basis -Due to altered mental status may consider MRI brain to rule out metastatic lesions- CT head is  reassuring   Thank you for allowing me to participate in the care of this patient.  Patient/Family are satisfied with care plan and all questions have been answered.    Provider disclosure: Patient with at least one acute or chronic  illness or injury that poses a threat to life or bodily function and is being managed actively during this encounter.  All of the below services have been performed independently by signing provider:  review of prior documentation from internal and or external health records.  Review of previous and current lab results.  Interview and comprehensive assessment during patient visit today. Review of current and previous chest radiographs/CT scans. Discussion of management and test interpretation with health care team and patient/family.   This document was prepared using Dragon voice recognition software and may include unintentional dictation errors.     Ottie Glazier, M.D.  Division of Pulmonary & Critical Care Medicine

## 2022-04-07 NOTE — NC FL2 (Signed)
[place] LEVEL OF CARE FORM     IDENTIFICATION  Patient Name: Selena Pittman Birthdate: 02-Sep-1950 Sex: female Admission Date (Current Location): 04/02/2022  [facility] and [place] Number:  Engineering geologist and Address:  [facility], [address] [place], [place] [address]      Provider Number: [id number]  Attending Physician Name and Address:  Flora Lipps, MD  Relative Name and Phone Number:  [name] (son) [phone]    Current Level of Care: Hospital Recommended Level of Care: [place] Prior Approval Number:    Date Approved/Denied:   PASRR Number: Pending  Discharge Plan: SNF    Current Diagnoses: Patient Active Problem List   Diagnosis Date Noted   Necrotizing pneumonia ([place]) 04/06/2022   Sepsis ([place]) 04/02/2022   Tobacco use 04/02/2022   GERD (gastroesophageal reflux disease) 04/02/2022   Schizophrenia ([place]) 04/02/2022   Leukocytosis 04/02/2022   Hypokalemia 04/02/2022   Lung mass 04/02/2022   Malignant neoplasm of left female breast ([place]) 06/05/2015    Orientation RESPIRATION BLADDER Height & Weight     Self, Situation, Place  Normal Incontinent, External catheter Weight: 121 lb 14.6 oz (55.3 kg) Height:  '5\' 4"'$  (162.6 cm)  BEHAVIORAL SYMPTOMS/MOOD NEUROLOGICAL BOWEL NUTRITION STATUS      Incontinent Diet (see discharge summary)  AMBULATORY STATUS COMMUNICATION OF NEEDS Skin   Limited Assist Verbally Other (Comment), Skin abrasions (right face abrasion)                       Personal Care Assistance Level of Assistance  Feeding, Dressing, Total care, Bathing Bathing Assistance: Limited assistance Feeding assistance: Limited assistance Dressing Assistance: Limited assistance Total Care Assistance: Limited assistance   Functional Limitations Info  Sight, Hearing, Speech Sight Info: Adequate Hearing Info: Adequate Speech Info: Adequate    SPECIAL CARE FACTORS FREQUENCY  PT  (By licensed PT), OT (By licensed OT)     PT Frequency: min 4x weekly OT Frequency: min 4x weekly            Contractures Contractures Info: Not present    Additional Factors Info  Code Status, Allergies Code Status Info: full Allergies Info: Cat Hair Extract  Milk-related Compounds  Mixed Ragweed  Peanut-containing Drug Products           Current Medications (04/07/2022):  This is the current hospital active medication list Current Facility-Administered Medications  Medication Dose Route Frequency Provider Last Rate Last Admin   0.9 %  sodium chloride infusion   Intravenous PRN Pokhrel, Laxman, MD 10 mL/hr at 04/06/22 2115 New Bag at 04/06/22 2115   acetaminophen (TYLENOL) tablet 650 mg  650 mg Oral Q6H PRN Cox, Amy N, DO       Or   acetaminophen (TYLENOL) suppository 650 mg  650 mg Rectal Q6H PRN Cox, Amy N, DO       Ampicillin-Sulbactam (UNASYN) 3 g in sodium chloride 0.9 % 100 mL IVPB  3 g Intravenous Q6H Ravishankar, Jayashree, MD 200 mL/hr at 04/07/22 0542 3 g at 04/07/22 0542   cholecalciferol (VITAMIN D3) 25 MCG (1000 UNIT) tablet 2,000 Units  2,000 Units Oral Daily Cox, Amy N, DO   2,000 Units at 04/07/22 0851   cloZAPine (CLOZARIL) tablet 400 mg  400 mg Oral QHS Cox, Amy N, DO   400 mg at 04/06/22 2106   docusate sodium (COLACE) capsule 100 mg  100 mg Oral BID Pokhrel, Laxman, MD   100 mg at  04/07/22 0851   hydrOXYzine (ATARAX) tablet 25 mg  25 mg Oral BID Cox, Amy N, DO   25 mg at 04/07/22 0852   letrozole [facility]) tablet 2.5 mg  2.5 mg Oral Daily Cox, Amy N, DO   2.5 mg at 04/07/22 0858   linaclotide (LINZESS) capsule 145 mcg  145 mcg Oral QAC breakfast Cox, Amy N, DO   145 mcg at 04/07/22 0853   linezolid (ZYVOX) IVPB 600 mg  600 mg Intravenous Q12H Tsosie Billing, MD 300 mL/hr at 04/07/22 0853 600 mg at 04/07/22 0853   montelukast (SINGULAIR) tablet 5 mg  5 mg Oral QHS Vira Blanco, RPH   5 mg at 04/06/22 2106   mupirocin ointment (BACTROBAN) 2 %   Nasal  BID Flora Lipps, MD   Given at 04/07/22 0858   nicotine (NICODERM CQ - dosed in mg/24 hours) patch 21 mg  21 mg Transdermal Daily PRN Cox, Amy N, DO   21 mg at 04/07/22 0853   ondansetron (ZOFRAN) tablet 4 mg  4 mg Oral Q6H PRN Cox, Amy N, DO       Or   ondansetron (ZOFRAN) injection 4 mg  4 mg Intravenous Q6H PRN Cox, Amy N, DO   4 mg at 04/06/22 1531   oxyCODONE (Oxy IR/ROXICODONE) immediate release tablet 5 mg  5 mg Oral Q6H PRN Pokhrel, Laxman, MD   5 mg at 04/04/22 0019   pantoprazole (PROTONIX) EC tablet 40 mg  40 mg Oral Daily Cox, Amy N, DO   40 mg at 04/07/22 0851   polyethylene glycol (MIRALAX / GLYCOLAX) packet 17 g  17 g Oral Daily Pokhrel, Laxman, MD   17 g at 04/07/22 2952   senna-docusate (Senokot-S) tablet 1 tablet  1 tablet Oral QHS PRN Cox, Amy N, DO       simvastatin (ZOCOR) tablet 20 mg  20 mg Oral Daily Cox, Amy N, DO   20 mg at 04/07/22 8413     Discharge Medications: Please see discharge summary for a list of discharge medications.  Relevant Imaging Results:  Relevant Lab Results:   Additional Information [id number]  Tiburcio Bash, LCSW

## 2022-04-08 DIAGNOSIS — R531 Weakness: Secondary | ICD-10-CM | POA: Diagnosis not present

## 2022-04-08 DIAGNOSIS — C50912 Malignant neoplasm of unspecified site of left female breast: Secondary | ICD-10-CM | POA: Diagnosis not present

## 2022-04-08 DIAGNOSIS — K219 Gastro-esophageal reflux disease without esophagitis: Secondary | ICD-10-CM | POA: Diagnosis not present

## 2022-04-08 DIAGNOSIS — E876 Hypokalemia: Secondary | ICD-10-CM | POA: Diagnosis not present

## 2022-04-08 DIAGNOSIS — A419 Sepsis, unspecified organism: Secondary | ICD-10-CM | POA: Diagnosis not present

## 2022-04-08 DIAGNOSIS — R918 Other nonspecific abnormal finding of lung field: Secondary | ICD-10-CM | POA: Diagnosis not present

## 2022-04-08 LAB — BASIC METABOLIC PANEL
Anion gap: 5 (ref 5–15)
BUN: 9 mg/dL (ref 8–23)
CO2: 25 mmol/L (ref 22–32)
Calcium: 8.1 mg/dL — ABNORMAL LOW (ref 8.9–10.3)
Chloride: 106 mmol/L (ref 98–111)
Creatinine, Ser: 0.6 mg/dL (ref 0.44–1.00)
GFR, Estimated: >60 mL/min (ref >60)
Glucose, Bld: 119 mg/dL — ABNORMAL HIGH (ref 70–99)
Potassium: 3.9 mmol/L (ref 3.5–5.1)
Sodium: 136 mmol/L (ref 135–145)

## 2022-04-08 LAB — CBC
HCT: 32.5 % — ABNORMAL LOW (ref 36.0–46.0)
Hemoglobin: 10.5 g/dL — ABNORMAL LOW (ref 12.0–15.0)
MCH: 29.1 pg (ref 26.0–34.0)
MCHC: 32.3 g/dL (ref 30.0–36.0)
MCV: 90 fL (ref 80.0–100.0)
Platelets: 409 10*3/uL — ABNORMAL HIGH (ref 150–400)
RBC: 3.61 MIL/uL — ABNORMAL LOW (ref 3.87–5.11)
RDW: 13.1 % (ref 11.5–15.5)
WBC: 11.3 10*3/uL — ABNORMAL HIGH (ref 4.0–10.5)
nRBC: 0 % (ref 0.0–0.2)

## 2022-04-08 LAB — MAGNESIUM: Magnesium: 2.2 mg/dL (ref 1.7–2.4)

## 2022-04-08 MED ORDER — BISACODYL 10 MG RE SUPP
10.0000 mg | Freq: Once | RECTAL | Status: AC
  Administered 2022-04-08: 10 mg via RECTAL
  Filled 2022-04-08: qty 1

## 2022-04-08 MED ORDER — BISACODYL 10 MG RE SUPP: 10.0000 mg | Freq: Every day | RECTAL | Status: DC

## 2022-04-08 NOTE — Consult Note (Signed)
Chief Complaint: Lung Mass  Referring Physician(s): Dr. Lanney Gins Dr. Delaine Lame  Supervising Physician: Corrie Mckusick  Patient Status: [place] - In-pt  History of Present Illness: Selena Pittman is a 71 y.o. female with right lung mass, referred for possible image guided biopsy.   She was admitted through the ED 04/02/22 for weakness.  CT discovered a necrotic mass of the RUL. Pulmonary was consulted, ID consulted.    She has history of breast CA, no history of lung CA.    She hs been referred for CT guided lung mass biopsy.   Until recently she has been assisted living, completing all of her ADLs fairly independently.   Her son tells me that she has been somewhat deteriorating recently.    Her son is her POA.    Past Medical History:  Diagnosis Date   Bipolar affective ([place])    Breast cancer ([place])    Cancer ([place]) 12-11-14   INVASIVE MAMMARY CARCINOMA/.left breast/ T2 N0   Cardiomegaly    Depression    Emphysema of lung ([place])    Endometriosis    Hyperlipidemia    Osteoporosis    Schizoaffective disorder (HCC)    Schizophrenia (H[facility])    Thyroid nodule    Vitamin D deficiency     Past Surgical History:  Procedure Laterality Date   ABDOMINAL HYSTERECTOMY     BREAST BIOPSY Left 12-11-14   INVASIVE MAMMARY CARCINOMA.   BREAST LUMPECTOMY WITH SENTINEL LYMPH NODE BIOPSY Left 12/31/2014   Procedure: LEFT BREAST LUMPECTOMY WITH ULTRASOUND GUIDED NEEDLE LOCALIZATION, SENTINEL LYMPH NODE BIOPSY ;  Surgeon: Christene Lye, MD;  Location: [facility] ORS;  Service: General;  Laterality: Left;   DIAGNOSTIC MAMMOGRAM  12/04/2014   Done at [facility] Imaging Category 5-Left Breast   DILATION AND CURETTAGE OF UTERUS     MASTECTOMY Left 2016   SIMPLE MASTECTOMY WITH AXILLARY SENTINEL NODE BIOPSY Left 01/14/2015   Procedure: SIMPLE MASTECTOMY;  Surgeon: Christene Lye, MD;  Location: [facility] ORS;  Service: General;  Laterality: Left;   TENDON REPAIR Right    hand    TONSILLECTOMY     TUBAL LIGATION      Allergies: Cat hair extract, Milk-related compounds, Mixed ragweed, and Peanut-containing drug products  Medications: Prior to Admission medications   Medication Sig Start Date End Date Taking? Authorizing Provider  alendronate (FOSAMAX) 70 MG tablet Take 70 mg by mouth once a week. Take with a full glass of water on an empty stomach.   Yes [provider]  cetirizine (ZYRTEC) 10 MG tablet Take 10 mg by mouth daily.   Yes [provider]  cholecalciferol (VITAMIN D) 1000 UNITS tablet Take 2,000 Units by mouth daily.   Yes [provider]  cloZAPine (CLOZARIL) 100 MG tablet Take 400 mg by mouth daily.   Yes [provider]  docusate sodium (COLACE) 100 MG capsule Take 100 mg by mouth 2 (two) times daily. 10/09/21  Yes [provider]  hydrOXYzine (VISTARIL) 25 MG capsule Take 25 mg by mouth 2 (two) times daily. 07/06/18  Yes [provider]  letrozole (FEMARA) 2.5 MG tablet Take 1 tablet (2.5 mg total) by mouth daily. 08/07/15  Yes Choksi, Delorise Shiner, MD  LINZESS 145 MCG CAPS capsule Take 145 mcg by mouth daily.   Yes [provider]  montelukast (SINGULAIR) 5 MG chewable tablet Chew 5 mg by mouth at bedtime. 10/10/21  Yes [provider]  omeprazole (PRILOSEC) 20 MG capsule Take 20 mg by mouth  daily.   Yes [provider]  simvastatin (ZOCOR) 20 MG tablet Take 20 mg by mouth daily.   Yes [provider]  calcium carbonate (OSCAL) 1500 (600 Ca) MG TABS tablet Take 1 tablet by mouth 2 (two) times daily. Patient not taking: Reported on 04/02/2022    [provider]  coal tar-salicylic acid 2 % shampoo Use to wash scalp Tuesday, Thursday and Saturdays. 12/11/19   Ralene Bathe, MD  hydrOXYzine (ATARAX/VISTARIL) 25 MG tablet Take 25 mg by mouth 3 (three) times daily as needed. Patient not taking: Reported on 04/02/2022    [provider]  ketoconazole  (NIZORAL) 2 % cream Apply 1 application topically 2 (two) times daily. Patient not taking: Reported on 04/02/2022 07/13/18   [provider]  ketoconazole (NIZORAL) 2 % shampoo APPLY TO AFFECTED AREA TWICE A WEEK FOR 8 WEEKS; THEN USE AS NEEDED. 09/10/20   Ralene Bathe, MD  LINZESS 290 MCG CAPS capsule Take 290 mcg by mouth daily. Patient not taking: Reported on 04/02/2022 07/06/18   [provider]  mometasone (ELOCON) 0.1 % lotion Apply to scalp at bedtime Monday - Fridays Patient not taking: Reported on 04/02/2022 02/20/20   Ralene Bathe, MD  nystatin-triamcinolone [facility]) cream Apply 1 application topically 2 (two) times daily.  Patient not taking: Reported on 04/02/2022 11/19/17   [provider]     Family History  Problem Relation Age of Onset   Cancer Mother        uterine   Heart attack Father    Breast cancer Neg Hx     Social History   Socioeconomic History   Marital status: Divorced    Spouse name: Not on file   Number of children: Not on file   Years of education: Not on file   Highest education level: Not on file  Occupational History   Not on file  Tobacco Use   Smoking status: Every Day    Packs/day: 0.20    Years: 45.00    Total pack years: 9.00    Types: Cigarettes   Smokeless tobacco: Never  Vaping Use   Vaping Use: Never used  Substance and Sexual Activity   Alcohol use: No    Alcohol/week: 0.0 standard drinks of alcohol   Drug use: No   Sexual activity: Not Currently  Other Topics Concern   Not on file  Social History Narrative   Not on file   Social Determinants of Health   Financial Resource Strain: Not on file  Food Insecurity: Not on file  Transportation Needs: Not on file  Physical Activity: Not on file  Stress: Not on file  Social Connections: Not on file      Review of Systems: A 12 point ROS discussed and pertinent positives are indicated in the HPI above.  All other systems are  negative.  Review of Systems  Vital Signs: BP 100/70 (BP Location: Left Arm)   Pulse 97   Temp 99.3 F (37.4 C)   Resp 18   Ht '5\' 4"'$  (1.626 m)   Wt 55.3 kg   SpO2 96%   BMI 20.93 kg/m   Advance Care Plan: The advanced care plan/surrogate decision maker was discussed at the time of visit and documented in the medical record.    Physical Exam General: 71 yo female appearing stated age.  Well-developed, well-nourished.  No distress. HEENT: Atraumatic, normocephalic.  Conjugate gaze, extra-ocular motor intact. No scleral icterus or scleral injection.  No lesions on external ears, nose, lips, or gums.  Oral mucosa moist, pink.  Neck: Symmetric with no goiter enlargement.  Chest/Lungs:  Symmetric chest with inspiration/expiration.  No labored breathing.  Decreased sounds RUL.  Left clear.  Heart:  tachy, with no third heart sounds appreciated. No JVD appreciated.  Abdomen:  Soft, NT/ND, with + bowel sounds.   Genito-urinary: Deferred      Imaging: CT ABDOMEN PELVIS W CONTRAST  Result Date: 04/03/2022 CLINICAL DATA:  Abdominal pain, acute, nonlocalized. EXAM: CT ABDOMEN AND PELVIS WITH CONTRAST TECHNIQUE: Multidetector CT imaging of the abdomen and pelvis was performed using the standard protocol following bolus administration of intravenous contrast. RADIATION DOSE REDUCTION: This exam was performed according to the departmental dose-optimization program which includes automated exposure control, adjustment of the mA and/or kV according to patient size and/or use of iterative reconstruction technique. CONTRAST:  26m OMNIPAQUE IOHEXOL 300 MG/ML  SOLN COMPARISON:  CT examination dated December 11, 2021 FINDINGS: Lower chest: Bibasilar atelectasis. Hepatobiliary: No focal liver abnormality is seen. No gallstones, gallbladder wall thickening, or biliary dilatation. Pancreas: Unremarkable. No pancreatic ductal dilatation or surrounding inflammatory changes. Spleen: Normal in size without focal  abnormality. Adrenals/Urinary Tract: Adrenal glands are unremarkable. Kidneys are normal, without renal calculi, focal lesion, or hydronephrosis. Bladder is unremarkable. Stomach/Bowel: Stomach is within normal limits. Small bowel loops are normal in caliber. Appendix not identified. There is moderate amount of stool in the descending and rectosigmoid colon. There is gaseous distention of the transverse colon. No bowel wall thickening or inflammatory changes. Vascular/Lymphatic: Moderate aortic atherosclerosis. No enlarged abdominal or pelvic lymph nodes. Reproductive: Status post hysterectomy. No adnexal masses. Other: No abdominal wall hernia or abnormality. No abdominopelvic ascites. Musculoskeletal: Multilevel degenerate disc disease with disc height loss and marginal osteophytes. Grade 1 anterolisthesis of L4, unchanged. Levoscoliosis of the lumbar spine. IMPRESSION: 1. Moderate amount of stool in the descending and rectosigmoid colon with gaseous distention of the transverse colon. No evidence of bowel obstruction or inflammatory changes. 2. Moderate aortic atherosclerosis. 3. Multilevel degenerate disc disease with disc height loss and marginal osteophytes. Grade 1 anterolisthesis of L4, unchanged. Levoscoliosis of the lumbar spine. Electronically Signed   By: IKeane PoliceD.O.   On: 04/03/2022 14:46   CT Chest W Contrast  Result Date: 04/02/2022 CLINICAL DATA:  Abnormal x-ray. Right chest mass. * Tracking Code: BO * EXAM: CT CHEST WITH CONTRAST TECHNIQUE: Multidetector CT imaging of the chest was performed during intravenous contrast administration. RADIATION DOSE REDUCTION: This exam was performed according to the departmental dose-optimization program which includes automated exposure control, adjustment of the mA and/or kV according to patient size and/or use of iterative reconstruction technique. CONTRAST:  721mOMNIPAQUE IOHEXOL 300 MG/ML  SOLN COMPARISON:  Chest radiographs 04/02/2022 and  12/03/2021. Abdominal CT 12/11/2021. FINDINGS: Cardiovascular: No acute vascular findings. Diffuse coronary artery atherosclerosis with lesser involvement of the aorta and great vessels. The heart size is normal. There is a small amount of pericardial fluid. Mediastinum/Nodes: There are no enlarged mediastinal, hilar or axillary lymph nodes. The thyroid gland, trachea and esophagus demonstrate no significant findings. Lungs/Pleura: No pleural effusion or pneumothorax. As seen on earlier radiographs, there is a well-circumscribed mass anteromedially in the right hemithorax which measures approximately 7.7 x 5.4 x 8.2 cm. This abuts the anterior chest wall and anterior mediastinum and has components within both the upper and middle lobes. This mass is heterogeneous with central low-density suspicious for necrosis. There is a peripheral irregular thick wall. There  is no air within this collection. Minimal surrounding airspace disease which may reflect atelectasis. There is additional mild dependent atelectasis in both lung bases. Mild underlying centrilobular and paraseptal emphysema. No other suspicious pulmonary nodules. Upper abdomen: No significant findings are demonstrated within the visualized upper abdomen. Musculoskeletal/Chest wall: There is no chest wall mass or suspicious osseous finding. No chest wall involvement by the mass is demonstrated. Thoracolumbar spondylosis noted. IMPRESSION: 1. Well-circumscribed heterogeneous mass anteromedially in the right hemithorax with central low-density suspicious for necrosis. By imaging, this is highly suspicious for malignancy. However, this mass is not apparent on the portable chest x-ray or abdominal CT done less than 4 months ago such that pulmonary abscess or other atypical infection should be considered in this patient presenting with a cough. Management options include short-term CT follow-up after appropriate antibiotic therapy and tissue sampling to assess for  infection and malignancy. 2. No evidence of metastatic disease. 3. No pleural effusion or pneumothorax. 4. Diffuse coronary artery atherosclerosis. Aortic Atherosclerosis (ICD10-I70.0) and Emphysema (ICD10-J43.9). Electronically Signed   By: Richardean Sale M.D.   On: 04/02/2022 14:31   CT Head Wo Contrast  Result Date: 04/02/2022 CLINICAL DATA:  Head trauma, minor (Age >= 65y); Neck trauma (Age >= 65y). Generalized weakness EXAM: CT HEAD WITHOUT CONTRAST CT CERVICAL SPINE WITHOUT CONTRAST TECHNIQUE: Multidetector CT imaging of the head and cervical spine was performed following the standard protocol without intravenous contrast. Multiplanar CT image reconstructions of the cervical spine were also generated. RADIATION DOSE REDUCTION: This exam was performed according to the departmental dose-optimization program which includes automated exposure control, adjustment of the mA and/or kV according to patient size and/or use of iterative reconstruction technique. COMPARISON:  03/28/2022 FINDINGS: CT HEAD FINDINGS Brain: No evidence of acute infarction, hemorrhage, hydrocephalus, extra-axial collection or mass lesion/mass effect. Moderate low-density changes within the periventricular and subcortical white matter compatible with chronic microvascular ischemic change. Mild diffuse cerebral volume loss. Vascular: No hyperdense vessel or unexpected calcification. Skull: Normal. Negative for fracture or focal lesion. Sinuses/Orbits: No acute finding. Other: Negative for scalp hematoma. CT CERVICAL SPINE FINDINGS Alignment: Facet joints are aligned without dislocation or traumatic listhesis. Dens and lateral masses are aligned. Unchanged reversal of the cervical lordosis. Skull base and vertebrae: No acute fracture. No primary bone lesion or focal pathologic process. Soft tissues and spinal canal: No prevertebral fluid or swelling. No visible canal hematoma. Disc levels: Advanced multilevel degenerative disc disease,  unchanged. Upper chest: Included lung apices are clear. Other: None. IMPRESSION: 1. No acute intracranial abnormality. 2. No acute fracture or subluxation of the cervical spine. 3. Chronic microvascular ischemic change and cerebral volume loss. 4. 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 Electronically Signed   By: Davina Poke D.O.   On: 04/02/2022 13:29   CT Cervical Spine Wo Contrast  Result Date: 04/02/2022 CLINICAL DATA:  Head trauma, minor (Age >= 65y); Neck trauma (Age >= 65y). Generalized weakness EXAM: CT HEAD WITHOUT CONTRAST CT CERVICAL SPINE WITHOUT CONTRAST TECHNIQUE: Multidetector CT imaging of the head and cervical spine was performed following the standard protocol without intravenous contrast. Multiplanar CT image reconstructions of the cervical spine were also generated. RADIATION DOSE REDUCTION: This exam was performed according to the departmental dose-optimization program which includes automated exposure control, adjustment of the mA and/or kV according to patient size and/or use of iterative reconstruction technique. COMPARISON:  03/28/2022 FINDINGS: CT HEAD FINDINGS Brain: No evidence of acute infarction, hemorrhage, hydrocephalus, extra-axial collection or mass lesion/mass  effect. Moderate low-density changes within the periventricular and subcortical white matter compatible with chronic microvascular ischemic change. Mild diffuse cerebral volume loss. Vascular: No hyperdense vessel or unexpected calcification. Skull: Normal. Negative for fracture or focal lesion. Sinuses/Orbits: No acute finding. Other: Negative for scalp hematoma. CT CERVICAL SPINE FINDINGS Alignment: Facet joints are aligned without dislocation or traumatic listhesis. Dens and lateral masses are aligned. Unchanged reversal of the cervical lordosis. Skull base and vertebrae: No acute fracture. No primary bone lesion or focal pathologic process. Soft tissues and spinal canal:  No prevertebral fluid or swelling. No visible canal hematoma. Disc levels: Advanced multilevel degenerative disc disease, unchanged. Upper chest: Included lung apices are clear. Other: None. IMPRESSION: 1. No acute intracranial abnormality. 2. No acute fracture or subluxation of the cervical spine. 3. Chronic microvascular ischemic change and cerebral volume loss. 4. 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 Electronically Signed   By: Davina Poke D.O.   On: 04/02/2022 13:29   DG Chest 2 View  Result Date: 04/02/2022 CLINICAL DATA:  Weakness and cough. EXAM: CHEST - 2 VIEW COMPARISON:  CXR 12/03/21 FINDINGS: There is a large mass projecting over the right perihilar region that is new compared to 12/03/2021. The heart size is normal. No pleural effusion. No pneumothorax. No displaced rib fractures. Visualized upper abdomen is unremarkable. IMPRESSION: Large, possibly pleural based, mass projecting over the right perihilar region. Recommend further evaluation with chest CT with contrast. This is new compared to 12/03/21. Electronically Signed   By: Marin Roberts M.D.   On: 04/02/2022 13:29   CT Cervical Spine Wo Contrast  Result Date: 03/28/2022 CLINICAL DATA:  71 year old female status post fall. Right facial laceration. EXAM: CT CERVICAL SPINE WITHOUT CONTRAST TECHNIQUE: Multidetector CT imaging of the cervical spine was performed without intravenous contrast. Multiplanar CT image reconstructions were also generated. RADIATION DOSE REDUCTION: This exam was performed according to the departmental dose-optimization program which includes automated exposure control, adjustment of the mA and/or kV according to patient size and/or use of iterative reconstruction technique. COMPARISON:  Head CT today.  Cervical spine CT 07/21/2018. FINDINGS: Alignment: Stable since 2020. Mild chronic reversal of upper cervical lordosis. Cervicothoracic junction alignment is within normal  limits. Bilateral posterior element alignment is within normal limits. Skull base and vertebrae: Visualized skull base is intact. No atlanto-occipital dissociation. Congenital incomplete ossification of the posterior C1 ring, normal variant. C1 and C2 appear intact and aligned. Bulky chronic cervical endplate osteophytosis. No acute osseous abnormality identified. Soft tissues and spinal canal: No prevertebral fluid or swelling. No visible canal hematoma. Mild for age cervical carotid calcified atherosclerosis. Mild motion artifact, otherwise negative visible noncontrast neck. 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 Multiple levels of vacuum disc. Mild cervical spinal stenosis is probable C3-C4 through C5-C6. Upper chest: Negative. IMPRESSION: 1. No acute traumatic injury identified in the cervical spine. 2. 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 Mild cervical spinal stenosis suspected C3-C4 through C5-C6. Electronically Signed   By: Genevie Ann M.D.   On: 03/28/2022 08:06   CT HEAD WO CONTRAST (5MM)  Result Date: 03/28/2022 CLINICAL DATA:  71 year old female status post fall. Right facial laceration. EXAM: CT HEAD WITHOUT CONTRAST TECHNIQUE: Contiguous axial images were obtained from the base of the skull through the vertex without intravenous contrast. RADIATION DOSE REDUCTION: This exam was performed according to the departmental dose-optimization program which includes automated exposure control, adjustment of the mA and/or kV  according to patient size and/or use of iterative reconstruction technique. COMPARISON:  Head CT 07/21/2018. FINDINGS: Brain: Mild cerebral volume loss since 2020 appears to be generalized. No midline shift, mass effect, or evidence of intracranial mass lesion. No ventriculomegaly. No acute intracranial hemorrhage identified. Bilateral Patchy and confluent cerebral white matter hypodensity has not  significantly changed, moderate for age. Deep white matter capsules, deep gray nuclei relatively spared. No acute or chronic cortical infarct identified. Vascular: Calcified atherosclerosis at the skull base. No suspicious intracranial vascular hyperdensity. Skull: Stable. No fracture identified. 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 Sinuses/Orbits: Visualized paranasal sinuses and mastoids are stable and well aerated. Other: No orbit or scalp soft tissue injury identified. IMPRESSION: 1. No acute traumatic injury identified. 2. 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 Electronically Signed   By: Genevie Ann M.D.   On: 03/28/2022 04:05    Labs:  CBC: Recent Labs    04/03/22 0446 04/04/22 0456 04/07/22 0437 04/08/22 0605  WBC 13.6* 11.8* 12.1* 11.3*  HGB 10.3* 9.7* 10.3* 10.5*  HCT 32.1* 29.7* 32.0* 32.5*  PLT 441* 434* 424* 409*    COAGS: No results for input(s): "INR", "APTT" in the last 8760 hours.  BMP: Recent Labs    04/04/22 0456 04/06/22 0258 04/07/22 0437 04/08/22 0605  NA 137 139 138 136  K 3.7 3.7 4.1 3.9  CL 108 105 104 106  CO2 '24 24 25 25  '$ GLUCOSE 111* 111* 127* 119*  BUN 7* '8 10 9  '$ CALCIUM 7.5* 8.0* 8.2* 8.1*  CREATININE 0.46 0.62 0.59 0.60  GFRNONAA >60 >60 >60 >60    LIVER FUNCTION TESTS: Recent Labs    12/11/21 0855 12/11/21 1105 04/02/22 1252  BILITOT  --  1.0 0.6  AST SPECIMEN HEMOLYZED. HEMOLYSIS MAY AFFECT INTEGRITY OF RESULTS. 17 43*  ALT SPECIMEN HEMOLYZED. HEMOLYSIS MAY AFFECT INTEGRITY OF RESULTS. 16 40  ALKPHOS SPECIMEN HEMOLYZED. HEMOLYSIS MAY AFFECT INTEGRITY OF RESULTS. 76 88  PROT SPECIMEN HEMOLYZED. HEMOLYSIS MAY AFFECT INTEGRITY OF RESULTS. 6.2* 6.6  ALBUMIN SPECIMEN HEMOLYZED. HEMOLYSIS MAY AFFECT INTEGRITY OF RESULTS. 3.8 2.6*    TUMOR MARKERS: No results for input(s): "AFPTM", "CEA", "CA199", "CHROMGRNA" in the last 8760 hours.  Assessment and Plan:  71 yo  female referred for image guided biopsy of RUL mass.   She will need path and cultures, per ID.   Risks and benefits of CT guided lung mass biopsy, possible chest tube was discussed with the patient and/or patient's family including, but not limited to bleeding, infection, damage to adjacent structures or low yield requiring additional tests.  All of the questions were answered and there is agreement to proceed.  Consent signed and in chart.   NPO past midnight Hold last dose of any AC  Thank you for this interesting consult.  I greatly enjoyed meeting Selena Pittman and look forward to participating in their care.  A copy of this report was sent to the requesting provider on this date.  Electronically Signed: Corrie Mckusick, DO 04/08/2022, 4:19 PM   I spent a total of 37 Mi[place]utes    in face to face in clinical consultation, greater than 50% of which was counseling/coordinating care for RUL lung mass vs infection, possible CT guided biopsy.

## 2022-04-08 NOTE — TOC Progression Note (Signed)
Transition of Care [facility]) - Progression Note    Patient Details  Name: Theo Reither MRN: [id number] Date of Birth: December 06, 1950  [facility] CM/SW [place], [place] Phone Number: 04/08/2022, 10:21 AM  Clinical Narrative:      Devin Going at [phone] with [facility] reports she assessed patient 12/19 and they feel they are able to meet patients needs. They report patient is abel to dc to their care home at discharge.    Barriers to Discharge: Continued Medical Work up  Expected Discharge Plan and [place] arrangements for the past 2 months: [place] ([place])                                       Social Determinants of Health (SDOH) Interventions SDOH Screenings   Tobacco Use: High Risk (04/03/2022)    Readmission Risk Interventions     No data to display

## 2022-04-08 NOTE — Progress Notes (Signed)
PROGRESS NOTE    Selena Pittman  HFW:[id number] DOB: 1951/02/17 DOA: 04/02/2022 PCP: Remi Haggard, FNP    Chief Complaint  Patient presents with   Weakness    Brief Narrative:  s. Selena Pittman is a 71 year old female with history of bipolar disorder,, hyperlipidemia, schizophrenia presented to the hospital with generalized weakness and ambulatory dysfunction.  In the ED patient had stable vitals.  Laboratory data showed potassium of 2.4.  WBC was elevated at 17.0.  COVID and influenza RSV PCR was negative.  Magnesium was 2.1.  Chest x-ray showed large possibly pleural-based mass projecting over the right perihilar region.  CT scan of the chest was recommended which showed a well-circumscribed homogeneous mass anteromedially in the right hemithorax with central low-density suspicious for necrosis. In the ED, patient received 1 L of IV fluid bolus, magnesium sulfate 2 g, potassium and was admitted to the hospital for further evaluation and treatment.  Pulmonary and ID was notified.  At this time patient is awaiting for CT-guided biopsy.  ID has started the patient on broad-spectrum antibiotic.    Assessment & Plan:   Principal Problem:   Mass of upper lobe of right lung Active Problems:   Malignant neoplasm of left female breast (HCC)   Sepsis ([place])   Tobacco use   GERD (gastroesophageal reflux disease)   Schizophrenia (HCC)   Leukocytosis   Hypokalemia   Necrotizing pneumonia (HCC)  #1 pulmonary parenchymal mass -Noted on chest x-ray, follow-up CT with concern for right sided lesion with central necrosis. -At this time unable to rule out pulmonary abscess/atypical infection. -Respiratory viral panel negative. -Serum aspergillosis negative. -MRSA PCR positive from nares. -COVID-19 PCR negative, influenza AMB negative. -Procalcitonin negative -Blood cultures with no growth to date. -Cryptococcal antigen negative. --Patient seen in consultation by pulmonary and  ID. -Patient currently on broad-spectrum antibiotics of Unasyn and linezolid. -Pulmonary following and recommending outpatient PET scan. -IR consulted for CT-guided lung mass biopsy which is scheduled for tomorrow 04/09/2022. -ID, pulmonary, IR following and appreciate their input and recommendations.  2.  Sepsis -Patient met criteria for sepsis on admission with tachypnea, tachycardia, leukocytosis of 17,000, CT chest/chest x-ray concerning for possible pulmonary abscess. -Blood cultures with no growth to date. -Urinalysis with 60,000 colonies of Klebsiella pneumonia which is sensitive to ampicillin sulbactam. -Currently afebrile. -Leukocytosis trending down currently at 11.3 from 17.0 on admission. -Continue empiric IV antibiotics of Unasyn, linezolid. -ID following.  3.  GERD -PPI.  4.  Hypokalemia -Repleted. -Potassium at 3.9, magnesium at 2.2. -Repeat labs in the AM.  5.  Nonspecific abdominal pain/constipation -CT abdomen and pelvis with no acute findings but significant stool in the colon. -Continue current bowel regimen of MiraLAX daily, Colace twice daily. -Dulcolax suppository x 1.  -Supportive care.  6.  Schizophrenia -Clozapine.  7.  Tobacco use -Tobacco cessation. -Nicotine patch.  8.  History of malignant neoplasm left breast -Continue home regimen letrozole. -   DVT prophylaxis: SCDs, Code Status: Full Family Communication: Updated patient.  No family at bedside. Disposition: TBD  Status is: Inpatient Remains inpatient appropriate because: Severity of illness   Consultants:  IR: Dr. Earleen Newport 04/08/2022 Pulmonary: Dr. Lanney Gins 04/02/2022 Infectious disease: Dr.Ravishanker 04/03/2022  Procedures:  CT chest 04/02/2022 CT head CT C-spine 04/02/2022 CT abdomen and pelvis 04/03/2022 Chest x-ray 04/02/2022   Antimicrobials:  Anti-infectives (From admission, onward)    Start     Dose/Rate Route Frequency Ordered Stop   04/04/22 0000   Ampicillin-Sulbactam (UNASYN) 3 g in  sodium chloride 0.9 % 100 mL IVPB        3 g 200 mL/hr over 30 Minutes Intravenous Every 6 hours 04/03/22 1403     04/03/22 1800  vancomycin (VANCOCIN) IVPB 1000 mg/200 mL premix  Status:  Discontinued        1,000 mg 200 mL/hr over 60 Minutes Intravenous Every 24 hours 04/02/22 1617 04/03/22 1403   04/03/22 1800  linezolid (ZYVOX) IVPB 600 mg        600 mg 300 mL/hr over 60 Minutes Intravenous Every 12 hours 04/03/22 1403     04/03/22 0500  ceFEPIme (MAXIPIME) 2 g in sodium chloride 0.9 % 100 mL IVPB  Status:  Discontinued        2 g 200 mL/hr over 30 Minutes Intravenous Every 12 hours 04/02/22 1617 04/02/22 1642   04/02/22 1645  piperacillin-tazobactam (ZOSYN) IVPB 3.375 g        3.375 g 12.5 mL/hr over 240 Minutes Intravenous Every 8 hours 04/02/22 1642 04/03/22 1802   04/02/22 1600  ceFEPIme (MAXIPIME) 2 g in sodium chloride 0.9 % 100 mL IVPB  Status:  Discontinued        2 g 200 mL/hr over 30 Minutes Intravenous  Once 04/02/22 1554 04/02/22 1642   04/02/22 1500  Ampicillin-Sulbactam (UNASYN) 3 g in sodium chloride 0.9 % 100 mL IVPB  Status:  Discontinued        3 g 200 mL/hr over 30 Minutes Intravenous  Once 04/02/22 1452 04/02/22 1609   04/02/22 1500  vancomycin (VANCOCIN) IVPB 1000 mg/200 mL premix        1,000 mg 200 mL/hr over 60 Minutes Intravenous  Once 04/02/22 1452 04/02/22 1857         Subjective: States not feeling well. Denies any significant SOB. No CP. No ABD PAIN.  Getting ready to eat her lunch.  Denies any hemoptysis or chest pain.  Objective: Vitals:   04/08/22 0900 04/08/22 1242 04/08/22 1716 04/08/22 2038  BP: 104/74 100/70 105/73 104/70  Pulse: 94 97 98 92  Resp: _0 Temp: 99.2 F (37.3 C) 99.3 F (37.4 C) 99.5 F (37.5 C) 98.9 F (37.2 C)  TempSrc:    Oral  SpO2: 95% 96% 96% 96%  Weight:      Height:        Intake/Output Summary (Last 24 hours) at 04/08/2022 2107 Last data filed at 04/08/2022  1656 Gross per 24 hour  Intake 455.2 ml  Output 800 ml  Net -344.8 ml   Filed Weights   04/02/22 1245  Weight: 55.3 kg    Examination:  General exam: Appears calm and comfortable.  Frail-appearing. Respiratory system: Clear to auscultation.  No wheezes, no crackles, no rhonchi.  Fair air movement.  Respiratory effort normal. Cardiovascular system: S1 & S2 heard, RRR. No JVD, murmurs, rubs, gallops or clicks. No pedal edema. Gastrointestinal system: Abdomen is nondistended, soft and nontender. No organomegaly or masses felt. Normal bowel sounds heard. Central nervous system: Alert and oriented. No focal neurological deficits. Extremities: Symmetric 5 x 5 power. Skin: No rashes, lesions or ulcers Psychiatry: Judgement and insight appear normal. Mood & affect appropriate.     Data Reviewed: I have personally reviewed following labs and imaging studies  CBC: Recent Labs  Lab 04/02/22 1252 04/03/22 0446 04/04/22 0456 04/07/22 0437 04/08/22 0605  WBC 17.0* 13.6* 11.8* 12.1* 11.3*  NEUTROABS 15.1*  --   --  10.5*  --   HGB 11.3* 10.3*  9.7* 10.3* 10.5*  HCT 34.8* 32.1* 29.7* 32.0* 32.5*  MCV 90.4 91.2 89.7 89.9 90.0  PLT 473* 441* 434* 424* 409*    Basic Metabolic Panel: Recent Labs  Lab 04/03/22 0446 04/04/22 0456 04/06/22 0258 04/07/22 0437 04/08/22 0605  NA 138 137 139 138 136  K 4.2 3.7 3.7 4.1 3.9  CL 106 108 105 104 106  CO2 _0 GLUCOSE 130* 111* 111* 127* 119*  BUN 8 7* _1 CREATININE 0.60 0.46 0.62 0.59 0.60  CALCIUM 7.6* 7.5* 8.0* 8.2* 8.1*  MG 2.3 2.1 1.9 2.0 2.2    GFR: Estimated Creatinine Clearance: 57.3 mL/min (by C-G formula based on SCr of 0.6 mg/dL).  Liver Function Tests: Recent Labs  Lab 04/02/22 1252  AST 43*  ALT 40  ALKPHOS 88  BILITOT 0.6  PROT 6.6  ALBUMIN 2.6*    CBG: No results for input(s): "GLUCAP" in the last 168 hours.   Recent Results (from the past 240 hour(s))  Resp panel by RT-PCR (RSV, Flu A&B,  Covid) Anterior Nasal Swab     Status: None   Collection Time: 04/02/22 12:56 PM   Specimen: Anterior Nasal Swab  Result Value Ref Range Status   SARS Coronavirus 2 by RT PCR NEGATIVE NEGATIVE Final    Comment: (NOTE) SARS-CoV-2 target nucleic acids are NOT DETECTED.  The SARS-CoV-2 RNA is generally detectable in upper respiratory specimens during the acute phase of infection. The lowest concentration of SARS-CoV-2 viral copies this assay can detect is 138 copies/mL. A negative result does not preclude SARS-Cov-2 infection and should not be used as the sole basis for treatment or other patient management decisions. A negative result may occur with  improper specimen collection/handling, submission of specimen other than nasopharyngeal swab, presence of viral mutation(s) within the areas targeted by this assay, and inadequate number of viral copies(<138 copies/mL). A negative result must be combined with clinical observations, patient history, and epidemiological information. The expected result is Negative.  Fact Sheet for Patients:  EntrepreneurPulse.com.au  Fact Sheet for Healthcare Providers:  IncredibleEmployment.be  This test is no t yet approved or cleared by the [place] FDA and  has been authorized for detection and/or diagnosis of SARS-CoV-2 by FDA under an Emergency Use Authorization (EUA). This EUA will remain  in effect (meaning this test can be used) for the duration of the COVID-19 declaration under Section 564(b)(1) of the Act, 21 U.S.C.section 360bbb-3(b)(1), unless the authorization is terminated  or revoked sooner.       Influenza A by PCR NEGATIVE NEGATIVE Final   Influenza B by PCR NEGATIVE NEGATIVE Final    Comment: (NOTE) The Xpert Xpress SARS-CoV-2/FLU/RSV plus assay is intended as an aid in the diagnosis of influenza from Nasopharyngeal swab specimens and should not be used as a sole basis for treatment. Nasal  washings and aspirates are unacceptable for Xpert Xpress SARS-CoV-2/FLU/RSV testing.  Fact Sheet for Patients: EntrepreneurPulse.com.au  Fact Sheet for Healthcare Providers: IncredibleEmployment.be  This test is not yet approved or cleared by the [place] FDA and has been authorized for detection and/or diagnosis of SARS-CoV-2 by FDA under an Emergency Use Authorization (EUA). This EUA will remain in effect (meaning this test can be used) for the duration of the COVID-19 declaration under Section 564(b)(1) of the Act, 21 U.S.C. section 360bbb-3(b)(1), unless the authorization is terminated or revoked.     Resp Syncytial Virus by PCR NEGATIVE NEGATIVE Final    Comment: (  NOTE) Fact Sheet for Patients: EntrepreneurPulse.com.au  Fact Sheet for Healthcare Providers: IncredibleEmployment.be  This test is not yet approved or cleared by the [place] FDA and has been authorized for detection and/or diagnosis of SARS-CoV-2 by FDA under an Emergency Use Authorization (EUA). This EUA will remain in effect (meaning this test can be used) for the duration of the COVID-19 declaration under Section 564(b)(1) of the Act, 21 U.S.C. section 360bbb-3(b)(1), unless the authorization is terminated or revoked.  Performed at [facility], [address] [place], [place] [address]   Culture, blood (Routine X 2) w Reflex to ID Panel     Status: None   Collection Time: 04/02/22  3:50 PM   Specimen: BLOOD  Result Value Ref Range Status   Specimen Description BLOOD BLOOD LEFT ARM  Final   Special Requests   Final    BOTTLES DRAWN AEROBIC AND ANAEROBIC Blood Culture results may not be optimal due to an inadequate volume of blood received in culture bottles   Culture   Final    NO GROWTH 5 DAYS Performed at [facility], [address] [place], [place] [address]    Report Status 04/07/2022 FINAL   Final  Respiratory (~20 pathogens) panel by PCR     Status: None   Collection Time: 04/02/22  3:50 PM   Specimen: Respiratory  Result Value Ref Range Status   Adenovirus NOT DETECTED NOT DETECTED Final   Coronavirus 229E NOT DETECTED NOT DETECTED Final    Comment: (NOTE) The Coronavirus on the Respiratory Panel, DOES NOT test for the novel  Coronavirus (2019 nCoV)    Coronavirus HKU1 NOT DETECTED NOT DETECTED Final   Coronavirus NL63 NOT DETECTED NOT DETECTED Final   Coronavirus OC43 NOT DETECTED NOT DETECTED Final   Metapneumovirus NOT DETECTED NOT DETECTED Final   Rhinovirus / Enterovirus NOT DETECTED NOT DETECTED Final   Influenza A NOT DETECTED NOT DETECTED Final   Influenza B NOT DETECTED NOT DETECTED Final   Parainfluenza Virus 1 NOT DETECTED NOT DETECTED Final   Parainfluenza Virus 2 NOT DETECTED NOT DETECTED Final   Parainfluenza Virus 3 NOT DETECTED NOT DETECTED Final   Parainfluenza Virus 4 NOT DETECTED NOT DETECTED Final   Respiratory Syncytial Virus NOT DETECTED NOT DETECTED Final   Bordetella pertussis NOT DETECTED NOT DETECTED Final   Bordetella Parapertussis NOT DETECTED NOT DETECTED Final   Chlamydophila pneumoniae NOT DETECTED NOT DETECTED Final   Mycoplasma pneumoniae NOT DETECTED NOT DETECTED Final    Comment: Performed at [facility], [place] [address] [place],  [address]  Culture, blood (Routine X 2) w Reflex to ID Panel     Status: None   Collection Time: 04/02/22  4:52 PM   Specimen: BLOOD  Result Value Ref Range Status   Specimen Description BLOOD BLOOD RIGHT ARM  Final   Special Requests   Final    BOTTLES DRAWN AEROBIC AND ANAEROBIC Blood Culture adequate volume   Culture   Final    NO GROWTH 5 DAYS Performed at [facility], [address], [place],  [address]    Report Status 04/07/2022 FINAL  Final  MRSA Next Gen by PCR, Nasal     Status: Abnormal   Collection Time: 04/02/22  5:50 PM  Result Value Ref Range Status    MRSA by PCR Next Gen DETECTED (A) NOT DETECTED Final    Comment: RESULT CALLED TO, READ BACK BY AND VERIFIED WITH: Butler Denmark _0  on 03/03/22  skl (NOTE) The GeneXpert MRSA Assay (FDA approved for NASAL specimens only), is one component of a comprehensive MRSA colonization surveillance program. It is not intended to diagnose MRSA infection nor to guide or monitor treatment for MRSA infections. Test performance is not FDA approved in patients less than 50 years old. Performed at [facility], [address] [place], [place] [address]   Aspergillus Ag, BAL/Serum     Status: None   Collection Time: 04/03/22  4:46 AM   Specimen: Vein  Result Value Ref Range Status   Aspergillus Ag, BAL/Serum 0.04 0.00 - 0.49 Index Final    Comment: (NOTE) Performed At: [facility] [place], [place] [id number] Rush Farmer MD XB:[id number]   Urine Culture     Status: Abnormal   Collection Time: 04/03/22  5:18 PM   Specimen: Urine, Random  Result Value Ref Range Status   Specimen Description   Final    URINE, RANDOM Performed at [facility], [address] [place], [place] [address]    Special Requests   Final    NONE Performed at [facility], [place], [place] [address]    Culture 60,000 COLONIES/mL KLEBSIELLA PNEUMONIAE (A)  Final   Report Status 04/06/2022 FINAL  Final   Organism ID, Bacteria KLEBSIELLA PNEUMONIAE (A)  Final      Susceptibility   Klebsiella pneumoniae - MIC*    AMPICILLIN RESISTANT Resistant     CEFAZOLIN <=4 SENSITIVE Sensitive     CEFEPIME <=0.12 SENSITIVE Sensitive     CEFTRIAXONE <=0.25 SENSITIVE Sensitive     CIPROFLOXACIN <=0.25 SENSITIVE Sensitive     GENTAMICIN <=1 SENSITIVE Sensitive     IMIPENEM <=0.25 SENSITIVE Sensitive     NITROFURANTOIN <=16 SENSITIVE Sensitive     TRIMETH/SULFA <=20 SENSITIVE Sensitive     AMPICILLIN/SULBACTAM 4 SENSITIVE Sensitive     PIP/TAZO <=4 SENSITIVE  Sensitive     * 60,000 COLONIES/mL KLEBSIELLA PNEUMONIAE         Radiology Studies: No results found.      Scheduled Meds:  cholecalciferol  2,000 Units Oral Daily   cloZAPine  400 mg Oral QHS   docusate sodium  100 mg Oral BID   hydrOXYzine  25 mg Oral BID   letrozole  2.5 mg Oral Daily   linaclotide  145 mcg Oral QAC breakfast   montelukast  5 mg Oral QHS   mupirocin ointment   Nasal BID   pantoprazole  40 mg Oral Daily   polyethylene glycol  17 g Oral Daily   simvastatin  20 mg Oral Daily   Continuous Infusions:  sodium chloride Stopped (04/08/22 1416)   ampicillin-sulbactam (UNASYN) IV 3 g (04/08/22 1743)   linezolid (ZYVOX) IV 600 mg (04/08/22 1010)     LOS: 6 days    Time spent: 40 minutes    Irine Seal, MD [facility]   To contact the attending provider between 7A-7P or the covering provider during after hours 7P-7A, please log into the web site [email or web address] and access using universal [place] password for that web site. If you do not have the password, please call the hospital operator.  04/08/2022, 9:07 PM

## 2022-04-08 NOTE — Progress Notes (Signed)
PULMONOLOGY         Date: 04/08/2022,   MRN# [id number] Jenah Vanasten 07-31-1950     AdmissionWeight: 55.3 kg                 CurrentWeight: 55.3 kg  Referring provider: Dr. Tobie Poet   CHIEF COMPLAINT:   Lung mass versus abscess   HISTORY OF PRESENT ILLNESS   This is a pleasant 71 year old female with a history of bipolar disorder, history of breast cancer, major depressive disorder, COPD with emphysema, dyslipidemia, osteoporosis, schizophrenia, thyroid nodules, vitamin D deficiency who came in due to undue fatigue and disequilibrium.  She apparently does have some confusion at baseline per family.  She did have a mechanical fall but denies having loss of consciousness or any injury.  She reports being too weak to be ambulatory and has been on bedrest for the last few days.  She does report having cough denies vomiting abdominal pain.  While in the ER she did have vitals done which were essentially normal except for tachypnea in the upper 20s.  She does have SpO2 of over 95% on room air.  She had urinalysis performed in the ER with a large amount of leukocyte Estrace and many bacteria consistent with possible UTI.  Her COVID-19 testing is negative.  CBC shows leukocytosis with a white count of 17,000, chronic anemia with a hemoglobin of 11 with a baseline around 15 and reactive thrombocytosis suggestive of ongoing bleed versus IDA.  CMP was performed with hypokalemia, hypoalbuminemia as well as transaminitis with increased AST. CT chest was performed which was reviewed by me independently with findings of right upper lobe anterior mass which is new from previous study which was abdominal CT scan performed in August with absence of this mass.  04/03/22-  patient seen and examined, remains on room air. Complains of abd pain but improved resp status. Discussed possible biopsy with patient. 04/06/22- patient reports minimal dyspnea.  She is on room air.  Her WBC count has improved  with Unasyn.  She reports ongoing abdominopelvic discomfort. She has abnormal UA suggestive of UTI with Klebsiella + urine culture that is resistant to multiple drugs. This may be cause of abd pain.  She has CT chest and abd done.  She has declined lung mass biopsy.  She seems to be responding to antibiotics so that may be acceptable to re-image later.  04/07/22- patient is stable, laying in bed comfortably.  She reports improvement in abdominal pain.  Very weak and deconditioned clinically.  Blood work essentially stable from prior with mild uptrend in wbc count. She remains on zyvox unasyn combination. Still refusing lung biopsy at this time. Will continue to follow.   PAST MEDICAL HISTORY   Past Medical History:  Diagnosis Date   Bipolar affective ([place])    Breast cancer ([place])    Cancer ([place]) 12-11-14   INVASIVE MAMMARY CARCINOMA/.left breast/ T2 N0   Cardiomegaly    Depression    Emphysema of lung ([place])    Endometriosis    Hyperlipidemia    Osteoporosis    Schizoaffective disorder ([place])    Schizophrenia ([facility])    Thyroid nodule    Vitamin D deficiency      SURGICAL HISTORY   Past Surgical History:  Procedure Laterality Date   ABDOMINAL HYSTERECTOMY     BREAST BIOPSY Left 12-11-14   INVASIVE MAMMARY CARCINOMA.   BREAST LUMPECTOMY WITH SENTINEL LYMPH NODE BIOPSY Left 12/31/2014   Procedure: LEFT BREAST  LUMPECTOMY WITH ULTRASOUND GUIDED NEEDLE LOCALIZATION, SENTINEL LYMPH NODE BIOPSY ;  Surgeon: Christene Lye, MD;  Location: [facility] ORS;  Service: General;  Laterality: Left;   DIAGNOSTIC MAMMOGRAM  12/04/2014   Done at [facility] Category 5-Left Breast   DILATION AND CURETTAGE OF UTERUS     MASTECTOMY Left 2016   SIMPLE MASTECTOMY WITH AXILLARY SENTINEL NODE BIOPSY Left 01/14/2015   Procedure: SIMPLE MASTECTOMY;  Surgeon: Christene Lye, MD;  Location: [facility] ORS;  Service: General;  Laterality: Left;   TENDON REPAIR Right    hand   TONSILLECTOMY     TUBAL LIGATION        FAMILY HISTORY   Family History  Problem Relation Age of Onset   Cancer Mother        uterine   Heart attack Father    Breast cancer Neg Hx      SOCIAL HISTORY   Social History   Tobacco Use   Smoking status: Every Day    Packs/day: 0.20    Years: 45.00    Total pack years: 9.00    Types: Cigarettes   Smokeless tobacco: Never  Vaping Use   Vaping Use: Never used  Substance Use Topics   Alcohol use: No    Alcohol/week: 0.0 standard drinks of alcohol   Drug use: No     MEDICATIONS    Home Medication:     Current Medication:  Current Facility-Administered Medications:    0.9 %  sodium chloride infusion, , Intravenous, PRN, Pokhrel, Laxman, MD, Last Rate: 10 mL/hr at 04/08/22 0809, Infusion Verify at 04/08/22 0809   Ampicillin-Sulbactam (UNASYN) 3 g in sodium chloride 0.9 % 100 mL IVPB, 3 g, Intravenous, Q6H, Ravishankar, Jayashree, MD, Last Rate: 200 mL/hr at 04/08/22 1138, 3 g at 04/08/22 1138   cholecalciferol (VITAMIN D3) 25 MCG (1000 UNIT) tablet 2,000 Units, 2,000 Units, Oral, Daily, Cox, Amy N, DO, 2,000 Units at 04/08/22 0959   cloZAPine (CLOZARIL) tablet 400 mg, 400 mg, Oral, QHS, Cox, Amy N, DO, 400 mg at 04/07/22 2150   docusate sodium (COLACE) capsule 100 mg, 100 mg, Oral, BID, Pokhrel, Laxman, MD, 100 mg at 04/08/22 0959   hydrOXYzine (ATARAX) tablet 25 mg, 25 mg, Oral, BID, Cox, Amy N, DO, 25 mg at 04/08/22 1000   letrozole [facility]) tablet 2.5 mg, 2.5 mg, Oral, Daily, Cox, Amy N, DO, 2.5 mg at 04/08/22 1001   linaclotide (LINZESS) capsule 145 mcg, 145 mcg, Oral, QAC breakfast, Cox, Amy N, DO, 145 mcg at 04/08/22 0743   linezolid (ZYVOX) IVPB 600 mg, 600 mg, Intravenous, Q12H, Ravishankar, Jayashree, MD, Last Rate: 300 mL/hr at 04/08/22 1010, 600 mg at 04/08/22 1010   montelukast (SINGULAIR) tablet 5 mg, 5 mg, Oral, QHS, Vira Blanco, RPH, 5 mg at 04/07/22 2149   mupirocin ointment (BACTROBAN) 2 %, , Nasal, BID, Pokhrel, Laxman, MD, Given at  04/08/22 1001   nicotine (NICODERM CQ - dosed in mg/24 hours) patch 21 mg, 21 mg, Transdermal, Daily PRN, Cox, Amy N, DO, 21 mg at 04/07/22 0853   oxyCODONE (Oxy IR/ROXICODONE) immediate release tablet 5 mg, 5 mg, Oral, Q6H PRN, Pokhrel, Laxman, MD, 5 mg at 04/07/22 1113   pantoprazole (PROTONIX) EC tablet 40 mg, 40 mg, Oral, Daily, Cox, Amy N, DO, 40 mg at 04/08/22 0959   polyethylene glycol (MIRALAX / GLYCOLAX) packet 17 g, 17 g, Oral, Daily, Pokhrel, Laxman, MD, 17 g at 04/08/22 1001   senna-docusate (Senokot-S) tablet 1 tablet, 1  tablet, Oral, QHS PRN, Cox, Amy N, DO   simvastatin (ZOCOR) tablet 20 mg, 20 mg, Oral, Daily, Cox, Amy N, DO, 20 mg at 04/08/22 6761    ALLERGIES   Cat hair extract, Milk-related compounds, Mixed ragweed, and Peanut-containing drug products     REVIEW OF SYSTEMS    Review of Systems:  Gen:  Denies  fever, sweats, chills weigh loss  HEENT: Denies blurred vision, double vision, ear pain, eye pain, hearing loss, nose bleeds, sore throat Cardiac:  No dizziness, chest pain or heaviness, chest tightness,edema Resp:   reports dyspnea chronically  Gi: Denies swallowing difficulty, stomach pain, nausea or vomiting, diarrhea, constipation, bowel incontinence Gu:  Denies bladder incontinence, burning urine Ext:   Denies Joint pain, stiffness or swelling Skin: Denies  skin rash, easy bruising or bleeding or hives Endoc:  Denies polyuria, polydipsia , polyphagia or weight change Psych:   Denies depression, insomnia or hallucinations   Other:  All other systems negative   VS: BP 104/74 (BP Location: Left Arm)   Pulse 94   Temp 99.2 F (37.3 C)   Resp 18   Ht '5\' 4"'$  (1.626 m)   Wt 55.3 kg   SpO2 95%   BMI 20.93 kg/m      PHYSICAL EXAM    GENERAL:NAD, no fevers, chills, no weakness no fatigue HEAD: Normocephalic, atraumatic.  EYES: Pupils equal, round, reactive to light. Extraocular muscles intact. No scleral icterus.  MOUTH: Moist mucosal membrane.  Dentition intact. No abscess noted.  EAR, NOSE, THROAT: Clear without exudates. No external lesions.  NECK: Supple. No thyromegaly. No nodules. No JVD.  PULMONARY: decreased breath sounds with mild rhonchi worse at bases bilaterally.  CARDIOVASCULAR: S1 and S2. Regular rate and rhythm. No murmurs, rubs, or gallops. No edema. Pedal pulses 2+ bilaterally.  GASTROINTESTINAL: Soft, nontender, nondistended. No masses. Positive bowel sounds. No hepatosplenomegaly.  MUSCULOSKELETAL: No swelling, clubbing, or edema. Range of motion full in all extremities.  NEUROLOGIC: Cranial nerves II through XII are intact. No gross focal neurological deficits. Sensation intact. Reflexes intact.  SKIN: No ulceration, lesions, rashes, or cyanosis. Skin warm and dry. Turgor intact.  PSYCHIATRIC: Mood, affect within normal limits. The patient is awake, alert and oriented x 3. Insight, judgment intact.       IMAGING     ASSESSMENT/PLAN   Large anterior right lung mass -Appears to be new since August  -Patient does have moderate pretest probability for lung cancer -Treating as abscess at this moment -Consultation infectious disease-input is appreciated. -Unable to produce respiratory culture -Procalcitonin trend is negative  -MRSA PCR -positive -Blood cultures x 2 have been ordered- NTD -Aspiratory viral panel- negative  -Aspergillus ab - negative -urine histoplasma antigen-negative -cryptococcal antigen-negative -Sputum cytology for possible malignancy -Likely will need PET scan on outpatient basis -Due to altered mental status may consider MRI brain to rule out metastatic lesions- CT head is reassuring   Thank you for allowing me to participate in the care of this patient.  Patient/Family are satisfied with care plan and all questions have been answered.    Provider disclosure: Patient with at least one acute or chronic illness or injury that poses a threat to life or bodily function and is being  managed actively during this encounter.  All of the below services have been performed independently by signing provider:  review of prior documentation from internal and or external health records.  Review of previous and current lab results.  Interview and comprehensive assessment during  patient visit today. Review of current and previous chest radiographs/CT scans. Discussion of management and test interpretation with health care team and patient/family.   This document was prepared using Dragon voice recognition software and may include unintentional dictation errors.     Ottie Glazier, M.D.  Division of Pulmonary & Critical Care Medicine

## 2022-04-08 NOTE — Progress Notes (Signed)
Mobility Specialist - Progress Note   04/08/22 1000  Mobility  Activity Turned to left side  $Mobility charge 1 Mobility     Pt lying in bed upon arrival, utilizing RA. Pt requesting assistance with breakfast setup. Encouraged to sit EOB but declined all attempts at activity. Repositioned in bed for meal and assisted with tray. Left semi-supine with alarm set, needs in reach.    Kathee Delton Mobility Specialist 04/08/22, 10:23 AM

## 2022-04-09 ENCOUNTER — Inpatient Hospital Stay: Payer: Medicare Other

## 2022-04-09 DIAGNOSIS — K219 Gastro-esophageal reflux disease without esophagitis: Secondary | ICD-10-CM | POA: Diagnosis not present

## 2022-04-09 DIAGNOSIS — J850 Gangrene and necrosis of lung: Secondary | ICD-10-CM | POA: Diagnosis not present

## 2022-04-09 DIAGNOSIS — D72829 Elevated white blood cell count, unspecified: Secondary | ICD-10-CM | POA: Diagnosis not present

## 2022-04-09 DIAGNOSIS — R531 Weakness: Secondary | ICD-10-CM | POA: Diagnosis not present

## 2022-04-09 DIAGNOSIS — Z170 Estrogen receptor positive status [ER+]: Secondary | ICD-10-CM | POA: Diagnosis not present

## 2022-04-09 DIAGNOSIS — R918 Other nonspecific abnormal finding of lung field: Secondary | ICD-10-CM | POA: Diagnosis not present

## 2022-04-09 DIAGNOSIS — E876 Hypokalemia: Secondary | ICD-10-CM | POA: Diagnosis not present

## 2022-04-09 DIAGNOSIS — A419 Sepsis, unspecified organism: Secondary | ICD-10-CM | POA: Diagnosis not present

## 2022-04-09 DIAGNOSIS — N3000 Acute cystitis without hematuria: Secondary | ICD-10-CM

## 2022-04-09 DIAGNOSIS — C50912 Malignant neoplasm of unspecified site of left female breast: Secondary | ICD-10-CM | POA: Diagnosis not present

## 2022-04-09 LAB — BASIC METABOLIC PANEL
Anion gap: 9 (ref 5–15)
BUN: 9 mg/dL (ref 8–23)
CO2: 25 mmol/L (ref 22–32)
Calcium: 8 mg/dL — ABNORMAL LOW (ref 8.9–10.3)
Chloride: 104 mmol/L (ref 98–111)
Creatinine, Ser: 0.61 mg/dL (ref 0.44–1.00)
GFR, Estimated: >60 mL/min (ref >60)
Glucose, Bld: 125 mg/dL — ABNORMAL HIGH (ref 70–99)
Potassium: 3.8 mmol/L (ref 3.5–5.1)
Sodium: 138 mmol/L (ref 135–145)

## 2022-04-09 LAB — CBC WITH DIFFERENTIAL/PLATELET
Abs Immature Granulocytes: 0.12 10*3/uL — ABNORMAL HIGH (ref 0.00–0.07)
Basophils Absolute: 0 10*3/uL (ref 0.0–0.1)
Basophils Relative: 0 %
Eosinophils Absolute: 0.2 10*3/uL (ref 0.0–0.5)
Eosinophils Relative: 1 %
HCT: 31.1 % — ABNORMAL LOW (ref 36.0–46.0)
Hemoglobin: 10.1 g/dL — ABNORMAL LOW (ref 12.0–15.0)
Immature Granulocytes: 1 %
Lymphocytes Relative: 6 %
Lymphs Abs: 0.7 10*3/uL (ref 0.7–4.0)
MCH: 28.8 pg (ref 26.0–34.0)
MCHC: 32.5 g/dL (ref 30.0–36.0)
MCV: 88.6 fL (ref 80.0–100.0)
Monocytes Absolute: 0.7 10*3/uL (ref 0.1–1.0)
Monocytes Relative: 7 %
Neutro Abs: 8.9 10*3/uL — ABNORMAL HIGH (ref 1.7–7.7)
Neutrophils Relative %: 85 %
Platelets: 403 10*3/uL — ABNORMAL HIGH (ref 150–400)
RBC: 3.51 MIL/uL — ABNORMAL LOW (ref 3.87–5.11)
RDW: 13.1 % (ref 11.5–15.5)
WBC: 10.6 10*3/uL — ABNORMAL HIGH (ref 4.0–10.5)
nRBC: 0 % (ref 0.0–0.2)

## 2022-04-09 LAB — MAGNESIUM: Magnesium: 2.1 mg/dL (ref 1.7–2.4)

## 2022-04-09 MED ORDER — MIDAZOLAM HCL 2 MG/2ML IJ SOLN
INTRAMUSCULAR | Status: AC
  Filled 2022-04-09: qty 2

## 2022-04-09 MED ORDER — LIDOCAINE VISCOUS HCL 2 % MT SOLN
15.0000 mL | Freq: Once | OROMUCOSAL | Status: AC
  Administered 2022-04-09: 15 mL via ORAL
  Filled 2022-04-09: qty 15

## 2022-04-09 MED ORDER — LINEZOLID 600 MG PO TABS
600.0000 mg | ORAL_TABLET | Freq: Two times a day (BID) | ORAL | Status: DC
  Administered 2022-04-09 – 2022-04-11 (×4): 600 mg via ORAL
  Filled 2022-04-09 (×4): qty 1

## 2022-04-09 MED ORDER — FENTANYL CITRATE (PF) 100 MCG/2ML IJ SOLN
INTRAMUSCULAR | Status: AC
  Filled 2022-04-09: qty 2

## 2022-04-09 MED ORDER — FENTANYL CITRATE (PF) 100 MCG/2ML IJ SOLN
INTRAMUSCULAR | Status: AC | PRN
  Administered 2022-04-09: 25 ug via INTRAVENOUS

## 2022-04-09 MED ORDER — ALUM & MAG HYDROXIDE-SIMETH 200-200-20 MG/5ML PO SUSP
30.0000 mL | Freq: Once | ORAL | Status: AC
  Administered 2022-04-09: 30 mL via ORAL
  Filled 2022-04-09: qty 30

## 2022-04-09 MED ORDER — MIDAZOLAM HCL 2 MG/2ML IJ SOLN
INTRAMUSCULAR | Status: AC | PRN
  Administered 2022-04-09: .5 mg via INTRAVENOUS

## 2022-04-09 NOTE — Consult Note (Signed)
Pharmacy - Clozapine    ANC submitted to the clozapine REMS program.     Oswald Hillock, PharmD, BCPS

## 2022-04-09 NOTE — Progress Notes (Signed)
PROGRESS NOTE    Selena Pittman  DDU:[id number] DOB: Dec 14, 1950 DOA: 04/02/2022 PCP: Remi Haggard, FNP    Chief Complaint  Patient presents with   Weakness    Brief Narrative:  s. Selena Pittman is a 71 year old female with history of bipolar disorder,, hyperlipidemia, schizophrenia presented to the hospital with generalized weakness and ambulatory dysfunction.  In the ED patient had stable vitals.  Laboratory data showed potassium of 2.4.  WBC was elevated at 17.0.  COVID and influenza RSV PCR was negative.  Magnesium was 2.1.  Chest x-ray showed large possibly pleural-based mass projecting over the right perihilar region.  CT scan of the chest was recommended which showed a well-circumscribed homogeneous mass anteromedially in the right hemithorax with central low-density suspicious for necrosis. In the ED, patient received 1 L of IV fluid bolus, magnesium sulfate 2 g, potassium and was admitted to the hospital for further evaluation and treatment.  Pulmonary and ID was notified.  At this time patient is awaiting for CT-guided biopsy.  ID has started the patient on broad-spectrum antibiotic.    Assessment & Plan:   Principal Problem:   Mass of upper lobe of right lung Active Problems:   Malignant neoplasm of left female breast (HCC)   Sepsis ([place])   Tobacco use   GERD (gastroesophageal reflux disease)   Schizophrenia (HCC)   Leukocytosis   Hypokalemia   Necrotizing pneumonia (HCC)  #1 pulmonary parenchymal mass -Noted on chest x-ray, follow-up CT with concern for right sided lesion with central necrosis. -At this time unable to rule out pulmonary abscess/atypical infection. -Respiratory viral panel negative. -Serum aspergillosis negative. -MRSA PCR positive from nares. -COVID-19 PCR negative, influenza AMB negative. -Procalcitonin negative -Blood cultures with no growth to date. -Cryptococcal antigen negative. --Patient seen in consultation by pulmonary and  ID. -Patient currently on broad-spectrum antibiotics of Unasyn and linezolid. -Pulmonary following and recommending outpatient PET scan. -IR consulted for CT-guided lung mass biopsy which was done today 04/09/2022 with biopsy for pathology and culture results pending.  -ID, pulmonary, IR following and appreciate their input and recommendations.  2.  Sepsis -Patient met criteria for sepsis on admission with tachypnea, tachycardia, leukocytosis of 17,000, CT chest/chest x-ray concerning for possible pulmonary abscess. -Blood cultures with no growth to date. -Urinalysis with 60,000 colonies of Klebsiella pneumonia which is sensitive to ampicillin sulbactam which patient is currently on.. -Currently afebrile. -Leukocytosis trending down currently at 10.6 from 11.3 from 17.0 on admission. -Continue empiric IV antibiotics of Unasyn, linezolid. -Patient underwent CT-guided lung biopsy this morning per IR with studies sent and pending. -ID following.  3.  Klebsiella UTI -Urine cultures with 60,000 colonies of Klebsiella sensitive to ampicillin sulbactam. -Currently on IV Unasyn.  4.  GERD -Continue PPI.  5.  Hypokalemia -Repleted. -Potassium at 3.8, magnesium at 2.1. -Repeat labs in the AM.  6.  Nonspecific abdominal pain/constipation -CT abdomen and pelvis with no acute findings but significant stool in the colon. -Continue current bowel regimen of MiraLAX daily, Colace twice daily. -Will give another Dulcolax suppository.   -Supportive care.  7.  Schizophrenia -Continue Clozapine.  8.  Tobacco use -Tobacco cessation. -Nicotine patch.  9.  History of malignant neoplasm left breast -Continue home regimen letrozole. -   DVT prophylaxis: SCDs, Code Status: Full Family Communication: Updated patient.  No family at bedside. Disposition: TBD  Status is: Inpatient Remains inpatient appropriate because: Severity of illness   Consultants:  IR: Dr. Earleen Newport 04/08/2022 Pulmonary:  Dr. Lanney Gins 04/02/2022 Infectious  disease: Dr.Ravishanker 04/03/2022  Procedures:  CT chest 04/02/2022 CT head CT C-spine 04/02/2022 CT abdomen and pelvis 04/03/2022 Chest x-ray 04/02/2022 CT right lung biopsy per IR, Dr.El-Abd 04/09/2022  Antimicrobials:  Anti-infectives (From admission, onward)    Start     Dose/Rate Route Frequency Ordered Stop   04/04/22 0000  Ampicillin-Sulbactam (UNASYN) 3 g in sodium chloride 0.9 % 100 mL IVPB        3 g 200 mL/hr over 30 Minutes Intravenous Every 6 hours 04/03/22 1403     04/03/22 1800  vancomycin (VANCOCIN) IVPB 1000 mg/200 mL premix  Status:  Discontinued        1,000 mg 200 mL/hr over 60 Minutes Intravenous Every 24 hours 04/02/22 1617 04/03/22 1403   04/03/22 1800  linezolid (ZYVOX) IVPB 600 mg        600 mg 300 mL/hr over 60 Minutes Intravenous Every 12 hours 04/03/22 1403     04/03/22 0500  ceFEPIme (MAXIPIME) 2 g in sodium chloride 0.9 % 100 mL IVPB  Status:  Discontinued        2 g 200 mL/hr over 30 Minutes Intravenous Every 12 hours 04/02/22 1617 04/02/22 1642   04/02/22 1645  piperacillin-tazobactam (ZOSYN) IVPB 3.375 g        3.375 g 12.5 mL/hr over 240 Minutes Intravenous Every 8 hours 04/02/22 1642 04/03/22 1802   04/02/22 1600  ceFEPIme (MAXIPIME) 2 g in sodium chloride 0.9 % 100 mL IVPB  Status:  Discontinued        2 g 200 mL/hr over 30 Minutes Intravenous  Once 04/02/22 1554 04/02/22 1642   04/02/22 1500  Ampicillin-Sulbactam (UNASYN) 3 g in sodium chloride 0.9 % 100 mL IVPB  Status:  Discontinued        3 g 200 mL/hr over 30 Minutes Intravenous  Once 04/02/22 1452 04/02/22 1609   04/02/22 1500  vancomycin (VANCOCIN) IVPB 1000 mg/200 mL premix        1,000 mg 200 mL/hr over 60 Minutes Intravenous  Once 04/02/22 1452 04/02/22 1857         Subjective: States not feeling too well, patient with some complaints of midsternal burning sensation with concerns for possible indigestion.  Denies any significant shortness of  breath.  Denies any abdominal pain.  Noted to have just returned from CT-guided biopsy.   Objective: Vitals:   04/09/22 1032 04/09/22 1042 04/09/22 1045 04/09/22 1113  BP:   96/73 101/64  Pulse:   100 87  Resp:   (!) 23 18  Temp:    98.4 F (36.9 C)  TempSrc:      SpO2: 92% 93% 91% 94%  Weight:      Height:        Intake/Output Summary (Last 24 hours) at 04/09/2022 1210 Last data filed at 04/09/2022 0441 Gross per 24 hour  Intake 286.26 ml  Output 1150 ml  Net -863.74 ml    Filed Weights   04/02/22 1245  Weight: 55.3 kg    Examination:  General exam: Frail appearing.  NAD.  Respiratory system: Some decreased breath sounds in the bases otherwise clear.  No wheezing, no crackles, no rhonchi.  Fair air movement.  Speaking in full sentences.   Cardiovascular system: Regular rate rhythm no murmurs rubs or gallops.  No JVD.  No lower extremity edema.  Gastrointestinal system: Abdomen is soft, nontender, nondistended, positive bowel sounds.  No rebound.  No guarding.  Central nervous system: Alert and oriented. No focal neurological deficits. Extremities:  Symmetric 5 x 5 power. Skin: No rashes, lesions or ulcers Psychiatry: Judgement and insight appear normal. Mood & affect appropriate.     Data Reviewed: I have personally reviewed following labs and imaging studies  CBC: Recent Labs  Lab 04/02/22 1252 04/03/22 0446 04/04/22 0456 04/07/22 0437 04/08/22 0605 04/09/22 0510  WBC 17.0* 13.6* 11.8* 12.1* 11.3* 10.6*  NEUTROABS 15.1*  --   --  10.5*  --  8.9*  HGB 11.3* 10.3* 9.7* 10.3* 10.5* 10.1*  HCT 34.8* 32.1* 29.7* 32.0* 32.5* 31.1*  MCV 90.4 91.2 89.7 89.9 90.0 88.6  PLT 473* 441* 434* 424* 409* 403*     Basic Metabolic Panel: Recent Labs  Lab 04/04/22 0456 04/06/22 0258 04/07/22 0437 04/08/22 0605 04/09/22 0510  NA 137 139 138 136 138  K 3.7 3.7 4.1 3.9 3.8  CL 108 105 104 106 104  CO2 _0 GLUCOSE 111* 111* 127* 119* 125*  BUN 7* _1 CREATININE 0.46 0.62 0.59 0.60 0.61  CALCIUM 7.5* 8.0* 8.2* 8.1* 8.0*  MG 2.1 1.9 2.0 2.2 2.1     GFR: Estimated Creatinine Clearance: 57.3 mL/min (by C-G formula based on SCr of 0.61 mg/dL).  Liver Function Tests: Recent Labs  Lab 04/02/22 1252  AST 43*  ALT 40  ALKPHOS 88  BILITOT 0.6  PROT 6.6  ALBUMIN 2.6*     CBG: No results for input(s): "GLUCAP" in the last 168 hours.   Recent Results (from the past 240 hour(s))  Resp panel by RT-PCR (RSV, Flu A&B, Covid) Anterior Nasal Swab     Status: None   Collection Time: 04/02/22 12:56 PM   Specimen: Anterior Nasal Swab  Result Value Ref Range Status   SARS Coronavirus 2 by RT PCR NEGATIVE NEGATIVE Final    Comment: (NOTE) SARS-CoV-2 target nucleic acids are NOT DETECTED.  The SARS-CoV-2 RNA is generally detectable in upper respiratory specimens during the acute phase of infection. The lowest concentration of SARS-CoV-2 viral copies this assay can detect is 138 copies/mL. A negative result does not preclude SARS-Cov-2 infection and should not be used as the sole basis for treatment or other patient management decisions. A negative result may occur with  improper specimen collection/handling, submission of specimen other than nasopharyngeal swab, presence of viral mutation(s) within the areas targeted by this assay, and inadequate number of viral copies(<138 copies/mL). A negative result must be combined with clinical observations, patient history, and epidemiological information. The expected result is Negative.  Fact Sheet for Patients:  EntrepreneurPulse.com.au  Fact Sheet for Healthcare Providers:  IncredibleEmployment.be  This test is no t yet approved or cleared by the [place] FDA and  has been authorized for detection and/or diagnosis of SARS-CoV-2 by FDA under an Emergency Use Authorization (EUA). This EUA will remain  in effect (meaning this test can be used)  for the duration of the COVID-19 declaration under Section 564(b)(1) of the Act, 21 U.S.C.section 360bbb-3(b)(1), unless the authorization is terminated  or revoked sooner.       Influenza A by PCR NEGATIVE NEGATIVE Final   Influenza B by PCR NEGATIVE NEGATIVE Final    Comment: (NOTE) The Xpert Xpress SARS-CoV-2/FLU/RSV plus assay is intended as an aid in the diagnosis of influenza from Nasopharyngeal swab specimens and should not be used as a sole basis for treatment. Nasal washings and aspirates are unacceptable for Xpert Xpress SARS-CoV-2/FLU/RSV testing.  Fact Sheet for Patients: EntrepreneurPulse.com.au  Fact Sheet for  Healthcare Providers: IncredibleEmployment.be  This test is not yet approved or cleared by the [place] and has been authorized for detection and/or diagnosis of SARS-CoV-2 by FDA under an Emergency Use Authorization (EUA). This EUA will remain in effect (meaning this test can be used) for the duration of the COVID-19 declaration under Section 564(b)(1) of the Act, 21 U.S.C. section 360bbb-3(b)(1), unless the authorization is terminated or revoked.     Resp Syncytial Virus by PCR NEGATIVE NEGATIVE Final    Comment: (NOTE) Fact Sheet for Patients: EntrepreneurPulse.com.au  Fact Sheet for Healthcare Providers: IncredibleEmployment.be  This test is not yet approved or cleared by the [place] FDA and has been authorized for detection and/or diagnosis of SARS-CoV-2 by FDA under an Emergency Use Authorization (EUA). This EUA will remain in effect (meaning this test can be used) for the duration of the COVID-19 declaration under Section 564(b)(1) of the Act, 21 U.S.C. section 360bbb-3(b)(1), unless the authorization is terminated or revoked.  Performed at [facility], [address] [place], [place] [address]   Culture, blood (Routine X 2) w Reflex to ID Panel      Status: None   Collection Time: 04/02/22  3:50 PM   Specimen: BLOOD  Result Value Ref Range Status   Specimen Description BLOOD BLOOD LEFT ARM  Final   Special Requests   Final    BOTTLES DRAWN AEROBIC AND ANAEROBIC Blood Culture results may not be optimal due to an inadequate volume of blood received in culture bottles   Culture   Final    NO GROWTH 5 DAYS Performed at [facility], [address] [place], [place] [address]    Report Status 04/07/2022 FINAL  Final  Respiratory (~20 pathogens) panel by PCR     Status: None   Collection Time: 04/02/22  3:50 PM   Specimen: Respiratory  Result Value Ref Range Status   Adenovirus NOT DETECTED NOT DETECTED Final   Coronavirus 229E NOT DETECTED NOT DETECTED Final    Comment: (NOTE) The Coronavirus on the Respiratory Panel, DOES NOT test for the novel  Coronavirus (2019 nCoV)    Coronavirus HKU1 NOT DETECTED NOT DETECTED Final   Coronavirus NL63 NOT DETECTED NOT DETECTED Final   Coronavirus OC43 NOT DETECTED NOT DETECTED Final   Metapneumovirus NOT DETECTED NOT DETECTED Final   Rhinovirus / Enterovirus NOT DETECTED NOT DETECTED Final   Influenza A NOT DETECTED NOT DETECTED Final   Influenza B NOT DETECTED NOT DETECTED Final   Parainfluenza Virus 1 NOT DETECTED NOT DETECTED Final   Parainfluenza Virus 2 NOT DETECTED NOT DETECTED Final   Parainfluenza Virus 3 NOT DETECTED NOT DETECTED Final   Parainfluenza Virus 4 NOT DETECTED NOT DETECTED Final   Respiratory Syncytial Virus NOT DETECTED NOT DETECTED Final   Bordetella pertussis NOT DETECTED NOT DETECTED Final   Bordetella Parapertussis NOT DETECTED NOT DETECTED Final   Chlamydophila pneumoniae NOT DETECTED NOT DETECTED Final   Mycoplasma pneumoniae NOT DETECTED NOT DETECTED Final    Comment: Performed at [facility], [place] [address] [place], [place] [address]  Culture, blood (Routine X 2) w Reflex to ID Panel     Status: None   Collection Time: 04/02/22  4:52 PM    Specimen: BLOOD  Result Value Ref Range Status   Specimen Description BLOOD BLOOD RIGHT ARM  Final   Special Requests   Final    BOTTLES DRAWN AEROBIC AND ANAEROBIC Blood Culture adequate volume   Culture  Final    NO GROWTH 5 DAYS Performed at [facility], [address] [place], [place] [address]    Report Status 04/07/2022 FINAL  Final  MRSA Next Gen by PCR, Nasal     Status: Abnormal   Collection Time: 04/02/22  5:50 PM  Result Value Ref Range Status   MRSA by PCR Next Gen DETECTED (A) NOT DETECTED Final    Comment: RESULT CALLED TO, READ BACK BY AND VERIFIED WITH: Butler Denmark _0  on 03/03/22 skl (NOTE) The GeneXpert MRSA Assay (FDA approved for NASAL specimens only), is one component of a comprehensive MRSA colonization surveillance program. It is not intended to diagnose MRSA infection nor to guide or monitor treatment for MRSA infections. Test performance is not FDA approved in patients less than 38 years old. Performed at [facility], [address] [place], [place] [address]   Aspergillus Ag, BAL/Serum     Status: None   Collection Time: 04/03/22  4:46 AM   Specimen: Vein  Result Value Ref Range Status   Aspergillus Ag, BAL/Serum 0.04 0.00 - 0.49 Index Final    Comment: (NOTE) Performed At: [facility] [address] [place], [place] [id number] Rush Farmer MD XF:[id number]   Urine Culture     Status: Abnormal   Collection Time: 04/03/22  5:18 PM   Specimen: Urine, Random  Result Value Ref Range Status   Specimen Description   Final    URINE, RANDOM Performed at [facility], [address], [place], [place] [address]    Special Requests   Final    NONE Performed at [facility], [place], [place] [address]    Culture 60,000 COLONIES/mL KLEBSIELLA PNEUMONIAE (A)  Final   Report Status 04/06/2022 FINAL  Final   Organism ID, Bacteria KLEBSIELLA PNEUMONIAE (A)  Final       Susceptibility   Klebsiella pneumoniae - MIC*    AMPICILLIN RESISTANT Resistant     CEFAZOLIN <=4 SENSITIVE Sensitive     CEFEPIME <=0.12 SENSITIVE Sensitive     CEFTRIAXONE <=0.25 SENSITIVE Sensitive     CIPROFLOXACIN <=0.25 SENSITIVE Sensitive     GENTAMICIN <=1 SENSITIVE Sensitive     IMIPENEM <=0.25 SENSITIVE Sensitive     NITROFURANTOIN <=16 SENSITIVE Sensitive     TRIMETH/SULFA <=20 SENSITIVE Sensitive     AMPICILLIN/SULBACTAM 4 SENSITIVE Sensitive     PIP/TAZO <=4 SENSITIVE Sensitive     * 60,000 COLONIES/mL KLEBSIELLA PNEUMONIAE         Radiology Studies: DG Chest Port 1 View  Result Date: 04/09/2022 CLINICAL DATA:  RIGHT-sided lung mass, cough, post lung biopsy EXAM: PORTABLE CHEST 1 VIEW COMPARISON:  CT chest 04/02/2022 FINDINGS: Normal heart size, mediastinal contours, and pulmonary vascularity. Large RIGHT upper lobe mass again identified 8.7 x 9.1 cm. Remaining lungs clear. No acute infiltrate, pleural effusion, or pneumothorax. IMPRESSION: No pneumothorax following lung biopsy. Large RIGHT upper lobe mass. Electronically Signed   By: Lavonia [name] M.D.   On: 04/09/2022 10:04        Scheduled Meds:  alum & mag hydroxide-simeth  30 mL Oral Once   And   lidocaine  15 mL Oral Once   cholecalciferol  2,000 Units Oral Daily   cloZAPine  400 mg Oral QHS   docusate sodium  100 mg Oral BID   fentaNYL       hydrOXYzine  25 mg Oral BID   letrozole  2.5 mg Oral Daily   linaclotide  145 mcg Oral QAC breakfast   midazolam       montelukast  5 mg Oral QHS   mupirocin ointment   Nasal BID   pantoprazole  40 mg Oral Daily   polyethylene glycol  17 g Oral Daily   simvastatin  20 mg Oral Daily   Continuous Infusions:  sodium chloride Stopped (04/08/22 1416)   ampicillin-sulbactam (UNASYN) IV 3 g (04/09/22 1115)   linezolid (ZYVOX) IV 600 mg (04/08/22 2125)     LOS: 7 days    Time spent: 40 minutes    Irine Seal, MD [facility]   To contact the  attending provider between 7A-7P or the covering provider during after hours 7P-7A, please log into the web site [email or web address] and access using universal [place] password for that web site. If you do not have the password, please call the hospital operator.  04/09/2022, 12:10 PM

## 2022-04-09 NOTE — Progress Notes (Signed)
PT Cancellation Note  Patient Details Name: Selena Pittman MRN: [id number] DOB: May 26, 1950   Cancelled Treatment:     PT attempt. PT hold. Pt currently off floor and on bedrest for 1 hr. Will return when pt is able to participate.    Willette Pa 04/09/2022, 9:37 AM

## 2022-04-09 NOTE — Progress Notes (Signed)
Physical Therapy Treatment Patient Details Name: Selena Pittman MRN: [id number] DOB: Apr 18, 1951 Today's Date: 04/09/2022   History of Present Illness presented to ER secondary to generalized weakness and difficulty with mobility; admitted for management/work up of pulmonary mass (necrosis vs pulmonary abscess vs atypical infection), hypokalemia    PT Comments    Pt was long sitting in bed upon arriving. She was just finishing up eating lunch. Refused OOB activity even with max encouragement. Did eventually agree to ther ex in bed but only participated minimally. See exercises performed listed below. Pt has been somewhat self limiting throughout admission. Per chart, pt is planning to DC to her ALF. Will benefit from continued skilled PT at DC to maximize independence and safety with ADLs.    Recommendations for follow up therapy are one component of a multi-disciplinary discharge planning process, led by the attending physician.  Recommendations may be updated based on patient status, additional functional criteria and insurance authorization.  Follow Up Recommendations  Skilled nursing-short term rehab (<3 hours/day) (will need 24/7 care. Per chart looks like pt is planning to retunr to her ALF)     Assistance Recommended at Discharge Frequent or constant Supervision/Assistance  Patient can return home with the following A lot of help with walking and/or transfers;A lot of help with bathing/dressing/bathroom   Equipment Recommendations  Other (comment) (defer to next level of care)       Precautions / Restrictions Precautions Precautions: Fall Restrictions Weight Bearing Restrictions: No     Mobility  Bed Mobility    General bed mobility comments: pt unwilling to get OOB. Did perform several bed level ther ex. see exercises performed list below.         Cognition Arousal/Alertness: Awake/alert Behavior During Therapy: WFL for tasks assessed/performed Overall Cognitive  Status: No family/caregiver present to determine baseline cognitive functioning    General Comments: Pt is alert but disoriented. Required max encouragement to participate however only willing to do minimal ther ex in bed. Encouraged to use IS to improve breathing.        Exercises General Exercises - Lower Extremity Ankle Circles/Pumps: AROM, 20 reps Quad Sets: AROM, 10 reps Heel Slides: AROM, 20 reps Straight Leg Raises: AAROM, 10 reps        Pertinent Vitals/Pain Pain Assessment Pain Assessment: No/denies pain Faces Pain Scale: No hurt Pain Descriptors / Indicators: Aching, Discomfort Pain Intervention(s): Limited activity within patient's tolerance, Monitored during session, Premedicated before session, Repositioned     PT Goals (current goals can now be found in the care plan section) Acute Rehab PT Goals Patient Stated Goal: none stated Progress towards PT goals: Not progressing toward goals - comment (self limiting)    Frequency    Min 2X/week      PT Plan Current plan remains appropriate       AM-PAC PT "6 Clicks" Mobility   Outcome Measure  Help needed turning from your back to your side while in a flat bed without using bedrails?: A Lot Help needed moving from lying on your back to sitting on the side of a flat bed without using bedrails?: A Lot Help needed moving to and from a bed to a chair (including a wheelchair)?: A Lot Help needed standing up from a chair using your arms (e.g., wheelchair or bedside chair)?: A Lot Help needed to walk in hospital room?: A Lot Help needed climbing 3-5 steps with a railing? : Total 6 Click Score: 11    End of Session  Activity Tolerance: Other (comment) (self limiting) Patient left: in bed;with call bell/phone within reach;with bed alarm set Nurse Communication: Mobility status PT Visit Diagnosis: Muscle weakness (generalized) (M62.81);Difficulty in walking, not elsewhere classified (R26.2)     Time:  0698-6148 PT Time Calculation (min) (ACUTE ONLY): 8 min  Charges:  $Therapeutic Exercise: 8-22 mins                     Julaine Fusi PTA 04/09/22, 1:56 PM

## 2022-04-09 NOTE — Progress Notes (Signed)
   Date of Admission:  04/02/2022     ID: Orlean Holtrop is a 71 y.o. female Principal Problem:   Mass of upper lobe of right lung Active Problems:   Malignant neoplasm of left female breast (HCC)   Sepsis ([place])   Tobacco use   GERD (gastroesophageal reflux disease)   Schizophrenia (HCC)   Leukocytosis   Hypokalemia   Necrotizing pneumonia (HCC)    Subjective: Pt had lung biopsy Says it was a "piece of cake"  Medications:   alum & mag hydroxide-simeth  30 mL Oral Once   And   lidocaine  15 mL Oral Once   cholecalciferol  2,000 Units Oral Daily   cloZAPine  400 mg Oral QHS   docusate sodium  100 mg Oral BID   fentaNYL       hydrOXYzine  25 mg Oral BID   letrozole  2.5 mg Oral Daily   linaclotide  145 mcg Oral QAC breakfast   midazolam       montelukast  5 mg Oral QHS   mupirocin ointment   Nasal BID   pantoprazole  40 mg Oral Daily   polyethylene glycol  17 g Oral Daily   simvastatin  20 mg Oral Daily    Objective: Vital signs in last 24 hours: Temp:  [97.3 F (36.3 C)-99.5 F (37.5 C)] 97.3 F (36.3 C) (12/21 1213) Pulse Rate:  [87-107] 106 (12/21 1213) Resp:  [17-31] 18 (12/21 1213) BP: (96-112)/(64-79) 109/71 (12/21 1213) SpO2:  [89 %-98 %] 95 % (12/21 1213)    PHYSICAL EXAM:  General: Alert, cooperative, no distress,  Scalp seborrheic keratosis Lungs: b/l air entry- crepts rt side Heart: Regular rate and rhythm, no murmur, rub or gallop. Abdomen: Soft, non-tender,not distended. Bowel sounds normal. No masses Extremities: atraumatic, no cyanosis. No edema. No clubbing Skin: No rashes or lesions. Or bruising Lymph: Cervical, supraclavicular normal. Neurologic: Grossly non-focal  Lab Results Recent Labs    04/08/22 0605 04/09/22 0510  WBC 11.3* 10.6*  HGB 10.5* 10.1*  HCT 32.5* 31.1*  NA 136 138  K 3.9 3.8  CL 106 104  CO2 25 25  BUN 9 9  CREATININE 0.60 0.61    Studies/Results:  Rt upper lobe mass with central  necrosis   Assessment/Plan: 71 y.o. female with a history of Bipolar disorder, breast ca s.p simple mastectomy left , now on letrazole, presents to the ED on 04/02/22 with weakness, pain abdome? ? Necrotic mass rt lung - this was not present in Aug CXR Some cough now- will collect sputum She has bad dentition R/o necrotizing pneumonia- r/o bacterial VS actinomyces/fungal R/o malignancy Has MRSA nares positive  Pt currently on linezolid ( day 7) + unasyn Had lung biopsy for pathology and culture Unfortunately there was an error made and both samples were delivered to path lab and the microbiology sample got contaminated  Anemia Poor nutrition   Uti with klebsiella- unasyn should treat it   H/o ca breast left mastectomy. On letrazole   Bipolar/schizoaffective - on clozapine?  Discussed the management with the care team

## 2022-04-09 NOTE — Progress Notes (Signed)
Occupational Therapy Treatment Patient Details Name: Selena Pittman MRN: [id number] DOB: Jul 13, 1950 Today's Date: 04/09/2022   History of present illness presented to ER secondary to generalized weakness and difficulty with mobility; admitted for management/work up of pulmonary mass (necrosis vs pulmonary abscess vs atypical infection), hypokalemia   OT comments  Ms Choate was seen for OT treatment on this date. Upon arrival to room pt reclined in bed, noted to have wet sheets, agreeable to tx. Pt requires MIN A bed mobility, fair sitting balance. MIN A + HHA sit<>stand, poor standing tolerance and pt appears anxious in standing, quickly returns to sitting then supine. Pt making progress toward goals, will continue to follow POC. Discharge recommendation remains appropriate.     Recommendations for follow up therapy are one component of a multi-disciplinary discharge planning process, led by the attending physician.  Recommendations may be updated based on patient status, additional functional criteria and insurance authorization.    Follow Up Recommendations  Skilled nursing-short term rehab (<3 hours/day) (may return home with assist for all mobility and ADLs)     Assistance Recommended at Discharge Frequent or constant Supervision/Assistance  Patient can return home with the following  A lot of help with walking and/or transfers;A lot of help with bathing/dressing/bathroom   Equipment Recommendations  BSC/3in1    Recommendations for Other Services      Precautions / Restrictions Precautions Precautions: Fall Restrictions Weight Bearing Restrictions: No       Mobility Bed Mobility Overal bed mobility: Needs Assistance Bed Mobility: Rolling, Supine to Sit, Sit to Supine Rolling: Min assist   Supine to sit: Min guard Sit to supine: Min guard   General bed mobility comments: max encouragement    Transfers Overall transfer level: Needs assistance Equipment used:  1 person hand held assist Transfers: Sit to/from Stand Sit to Stand: Min assist           General transfer comment: rises easily without assist, poor tolerance, MIN A to maintain standing for bed change     Balance Overall balance assessment: Needs assistance Sitting-balance support: Feet supported, Single extremity supported Sitting balance-Leahy Scale: Fair     Standing balance support: Bilateral upper extremity supported Standing balance-Leahy Scale: Poor                             ADL either performed or assessed with clinical judgement   ADL Overall ADL's : Needs assistance/impaired                                       General ADL Comments: MAX A pericare at bed level. MIN A + HHA for simulated BSC t/f.      Cognition Arousal/Alertness: Awake/alert Behavior During Therapy: WFL for tasks assessed/performed Overall Cognitive Status: No family/caregiver present to determine baseline cognitive functioning                                 General Comments: unclear baseline, recalls recent therapy session. follows commands with cues and time and encouragement                   Pertinent Vitals/ Pain       Pain Assessment Pain Assessment: No/denies pain   Frequency  Min 2X/week        Progress  Toward Goals  OT Goals(current goals can now be found in the care plan section)  Progress towards OT goals: Progressing toward goals  Acute Rehab OT Goals Patient Stated Goal: to go home OT Goal Formulation: With patient Time For Goal Achievement: 04/20/22 Potential to Achieve Goals: Good ADL Goals Pt Will Perform Grooming: with modified independence;sitting Pt Will Perform Lower Body Dressing: sit to/from stand;with min assist Pt Will Transfer to Toilet: with min assist;ambulating;bedside commode  Plan Discharge plan remains appropriate;Frequency remains appropriate    Co-evaluation                 AM-PAC  OT "6 Clicks" Daily Activity     Outcome Measure   Help from another person eating meals?: None Help from another person taking care of personal grooming?: A Little Help from another person toileting, which includes using toliet, bedpan, or urinal?: A Lot Help from another person bathing (including washing, rinsing, drying)?: A Lot Help from another person to put on and taking off regular upper body clothing?: A Little Help from another person to put on and taking off regular lower body clothing?: A Lot 6 Click Score: 16    End of Session    OT Visit Diagnosis: Other abnormalities of gait and mobility (R26.89);Muscle weakness (generalized) (M62.81)   Activity Tolerance Patient tolerated treatment well   Patient Left in bed;with call bell/phone within reach;with bed alarm set   Nurse Communication Mobility status        Time: 1351-1403 OT Time Calculation (min): 12 min  Charges: OT General Charges $OT Visit: 1 Visit OT Treatments $Self Care/Home Management : 8-22 mins  Dessie Coma, M.S. OTR/L  04/09/22, 2:13 PM  ascom [phone]

## 2022-04-09 NOTE — Sedation Documentation (Signed)
Pt coughing post lung biopsy oxygen sats down to 89 on 2 liters increased flow to 5 liters [place]. Dr Denna Haggard notified. Stat chest xray ordered before leaving procedure room.

## 2022-04-09 NOTE — Procedures (Signed)
Interventional Radiology Procedure Note  Date of Procedure: 04/09/2022  Procedure: CT right lung biopsy   Findings:  1. CT right lung biopsy 18ga core needle    Complications: No immediate complications noted.   Estimated Blood Loss: minimal  Follow-up and Recommendations: 1. Samples sent for path + micro    Albin Felling, MD  Vascular & Interventional Radiology  04/09/2022 9:35 AM

## 2022-04-09 NOTE — Progress Notes (Signed)
PULMONOLOGY         Date: 04/09/2022,   MRN# [id number] Selena Pittman Nov 14, 1950     AdmissionWeight: 55.3 kg                 CurrentWeight: 55.3 kg  Referring provider: Dr. Tobie Poet   CHIEF COMPLAINT:   Lung mass versus abscess   HISTORY OF PRESENT ILLNESS   This is a pleasant 71 year old female with a history of bipolar disorder, history of breast cancer, major depressive disorder, COPD with emphysema, dyslipidemia, osteoporosis, schizophrenia, thyroid nodules, vitamin D deficiency who came in due to undue fatigue and disequilibrium.  She apparently does have some confusion at baseline per family.  She did have a mechanical fall but denies having loss of consciousness or any injury.  She reports being too weak to be ambulatory and has been on bedrest for the last few days.  She does report having cough denies vomiting abdominal pain.  While in the ER she did have vitals done which were essentially normal except for tachypnea in the upper 20s.  She does have SpO2 of over 95% on room air.  She had urinalysis performed in the ER with a large amount of leukocyte Estrace and many bacteria consistent with possible UTI.  Her COVID-19 testing is negative.  CBC shows leukocytosis with a white count of 17,000, chronic anemia with a hemoglobin of 11 with a baseline around 15 and reactive thrombocytosis suggestive of ongoing bleed versus IDA.  CMP was performed with hypokalemia, hypoalbuminemia as well as transaminitis with increased AST. CT chest was performed which was reviewed by me independently with findings of right upper lobe anterior mass which is new from previous study which was abdominal CT scan performed in August with absence of this mass.  04/03/22-  patient seen and examined, remains on room air. Complains of abd pain but improved resp status. Discussed possible biopsy with patient. 04/06/22- patient reports minimal dyspnea.  She is on room air.  Her WBC count has improved  with Unasyn.  She reports ongoing abdominopelvic discomfort. She has abnormal UA suggestive of UTI with Klebsiella + urine culture that is resistant to multiple drugs. This may be cause of abd pain.  She has CT chest and abd done.  She has declined lung mass biopsy.  She seems to be responding to antibiotics so that may be acceptable to re-image later.  04/07/22- patient is stable, laying in bed comfortably.  She reports improvement in abdominal pain.  Very weak and deconditioned clinically.  Blood work essentially stable from prior with mild uptrend in wbc count. She remains on zyvox unasyn combination. Still refusing lung biopsy at this time. Will continue to follow.   04/08/22- Patient unchanged from prior. I spoke with POA son [name] , we reviewed imaging, labs , hospital course and findings. Reviewed plan for biopsy on hold until consent cleared. He will come in to get those signed today at [place] out to IR for biopsy schedule.   04/09/22- s/p Lung biopsy. Mild decrement in O2 suspect atelectasis. Have ordered IS and asked RN to encourage patient to use it.   PAST MEDICAL HISTORY   Past Medical History:  Diagnosis Date   Bipolar affective ([place])    Breast cancer ([place])    Cancer ([place]) 12-11-14   INVASIVE MAMMARY CARCINOMA/.left breast/ T2 N0   Cardiomegaly    Depression    Emphysema of lung ([place])    Endometriosis    Hyperlipidemia  Osteoporosis    Schizoaffective disorder ([place])    Schizophrenia ([place])    Thyroid nodule    Vitamin D deficiency      SURGICAL HISTORY   Past Surgical History:  Procedure Laterality Date   ABDOMINAL HYSTERECTOMY     BREAST BIOPSY Left 12-11-14   INVASIVE MAMMARY CARCINOMA.   BREAST LUMPECTOMY WITH SENTINEL LYMPH NODE BIOPSY Left 12/31/2014   Procedure: LEFT BREAST LUMPECTOMY WITH ULTRASOUND GUIDED NEEDLE LOCALIZATION, SENTINEL LYMPH NODE BIOPSY ;  Surgeon: Christene Lye, MD;  Location: [facility] ORS;  Service: General;  Laterality: Left;    DIAGNOSTIC MAMMOGRAM  12/04/2014   Done at [facility] Category 5-Left Breast   DILATION AND CURETTAGE OF UTERUS     MASTECTOMY Left 2016   SIMPLE MASTECTOMY WITH AXILLARY SENTINEL NODE BIOPSY Left 01/14/2015   Procedure: SIMPLE MASTECTOMY;  Surgeon: Christene Lye, MD;  Location: [facility] ORS;  Service: General;  Laterality: Left;   TENDON REPAIR Right    hand   TONSILLECTOMY     TUBAL LIGATION       FAMILY HISTORY   Family History  Problem Relation Age of Onset   Cancer Mother        uterine   Heart attack Father    Breast cancer Neg Hx      SOCIAL HISTORY   Social History   Tobacco Use   Smoking status: Every Day    Packs/day: 0.20    Years: 45.00    Total pack years: 9.00    Types: Cigarettes   Smokeless tobacco: Never  Vaping Use   Vaping Use: Never used  Substance Use Topics   Alcohol use: No    Alcohol/week: 0.0 standard drinks of alcohol   Drug use: No     MEDICATIONS    Home Medication:     Current Medication:  Current Facility-Administered Medications:    0.9 %  sodium chloride infusion, , Intravenous, PRN, Pokhrel, Laxman, MD, Stopped at 04/08/22 1416   alum & mag hydroxide-simeth (MAALOX/MYLANTA) 200-200-20 MG/5ML suspension 30 mL, 30 mL, Oral, Once **AND** lidocaine (XYLOCAINE) 2 % viscous mouth solution 15 mL, 15 mL, Oral, Once, Eugenie Filler, MD   Ampicillin-Sulbactam (UNASYN) 3 g in sodium chloride 0.9 % 100 mL IVPB, 3 g, Intravenous, Q6H, Ravishankar, Jayashree, MD, Last Rate: 200 mL/hr at 04/09/22 1115, 3 g at 04/09/22 1115   cholecalciferol (VITAMIN D3) 25 MCG (1000 UNIT) tablet 2,000 Units, 2,000 Units, Oral, Daily, Cox, Amy N, DO, 2,000 Units at 04/09/22 1114   cloZAPine (CLOZARIL) tablet 400 mg, 400 mg, Oral, QHS, Cox, Amy N, DO, 400 mg at 04/08/22 2114   docusate sodium (COLACE) capsule 100 mg, 100 mg, Oral, BID, Pokhrel, Laxman, MD, 100 mg at 04/09/22 1114   fentaNYL (SUBLIMAZE) 100 MCG/2ML injection, , , ,    hydrOXYzine  (ATARAX) tablet 25 mg, 25 mg, Oral, BID, Cox, Amy N, DO, 25 mg at 04/09/22 1115   letrozole [facility]) tablet 2.5 mg, 2.5 mg, Oral, Daily, Cox, Amy N, DO, 2.5 mg at 04/09/22 1119   linaclotide (LINZESS) capsule 145 mcg, 145 mcg, Oral, QAC breakfast, Cox, Amy N, DO, 145 mcg at 04/09/22 1115   linezolid (ZYVOX) IVPB 600 mg, 600 mg, Intravenous, Q12H, Ravishankar, Jayashree, MD, Last Rate: 300 mL/hr at 04/09/22 1219, 600 mg at 04/09/22 1219   midazolam (VERSED) 2 MG/2ML injection, , , ,    montelukast (SINGULAIR) tablet 5 mg, 5 mg, Oral, QHS, Vira Blanco, RPH, 5 mg at  04/08/22 2113   mupirocin ointment (BACTROBAN) 2 %, , Nasal, BID, Pokhrel, Laxman, MD, Given at 04/09/22 1114   nicotine (NICODERM CQ - dosed in mg/24 hours) patch 21 mg, 21 mg, Transdermal, Daily PRN, Cox, Amy N, DO, 21 mg at 04/07/22 3810   oxyCODONE (Oxy IR/ROXICODONE) immediate release tablet 5 mg, 5 mg, Oral, Q6H PRN, Pokhrel, Laxman, MD, 5 mg at 04/07/22 1113   pantoprazole (PROTONIX) EC tablet 40 mg, 40 mg, Oral, Daily, Cox, Amy N, DO, 40 mg at 04/09/22 1115   polyethylene glycol (MIRALAX / GLYCOLAX) packet 17 g, 17 g, Oral, Daily, Pokhrel, Laxman, MD, 17 g at 04/09/22 1115   senna-docusate (Senokot-S) tablet 1 tablet, 1 tablet, Oral, QHS PRN, Cox, Amy N, DO   simvastatin (ZOCOR) tablet 20 mg, 20 mg, Oral, Daily, Cox, Amy N, DO, 20 mg at 04/09/22 1114    ALLERGIES   Cat hair extract, Milk-related compounds, Mixed ragweed, and Peanut-containing drug products     REVIEW OF SYSTEMS    Review of Systems:  Gen:  Denies  fever, sweats, chills weigh loss  HEENT: Denies blurred vision, double vision, ear pain, eye pain, hearing loss, nose bleeds, sore throat Cardiac:  No dizziness, chest pain or heaviness, chest tightness,edema Resp:   reports dyspnea chronically  Gi: Denies swallowing difficulty, stomach pain, nausea or vomiting, diarrhea, constipation, bowel incontinence Gu:  Denies bladder incontinence, burning  urine Ext:   Denies Joint pain, stiffness or swelling Skin: Denies  skin rash, easy bruising or bleeding or hives Endoc:  Denies polyuria, polydipsia , polyphagia or weight change Psych:   Denies depression, insomnia or hallucinations   Other:  All other systems negative   VS: BP 109/71 (BP Location: Right Arm)   Pulse (!) 106   Temp (!) 97.3 F (36.3 C)   Resp 18   Ht '5\' 4"'$  (1.626 m)   Wt 55.3 kg   SpO2 95%   BMI 20.93 kg/m      PHYSICAL EXAM    GENERAL:NAD, no fevers, chills, no weakness no fatigue HEAD: Normocephalic, atraumatic.  EYES: Pupils equal, round, reactive to light. Extraocular muscles intact. No scleral icterus.  MOUTH: Moist mucosal membrane. Dentition intact. No abscess noted.  EAR, NOSE, THROAT: Clear without exudates. No external lesions.  NECK: Supple. No thyromegaly. No nodules. No JVD.  PULMONARY: decreased breath sounds with mild rhonchi worse at bases bilaterally.  CARDIOVASCULAR: S1 and S2. Regular rate and rhythm. No murmurs, rubs, or gallops. No edema. Pedal pulses 2+ bilaterally.  GASTROINTESTINAL: Soft, nontender, nondistended. No masses. Positive bowel sounds. No hepatosplenomegaly.  MUSCULOSKELETAL: No swelling, clubbing, or edema. Range of motion full in all extremities.  NEUROLOGIC: Cranial nerves II through XII are intact. No gross focal neurological deficits. Sensation intact. Reflexes intact.  SKIN: No ulceration, lesions, rashes, or cyanosis. Skin warm and dry. Turgor intact.  PSYCHIATRIC: Mood, affect within normal limits. The patient is awake, alert and oriented x 3. Insight, judgment intact.       IMAGING     ASSESSMENT/PLAN   Large anterior right lung mass -Appears to be new since August  -Patient does have moderate pretest probability for lung cancer -Treating as abscess at this moment -Consultation infectious disease-input is appreciated. -Unable to produce respiratory culture -Procalcitonin trend is negative  -MRSA PCR  -positive -Blood cultures x 2 have been ordered- NTD -Aspiratory viral panel- negative  -Aspergillus ab - negative -urine histoplasma antigen-negative -cryptococcal antigen-negative -Sputum cytology for possible malignancy -Likely will need PET  scan on outpatient basis -Due to altered mental status may consider MRI brain to rule out metastatic lesions- CT head is reassuring -s/p lung biopsy via IR service 06/10/20  Thank you for allowing me to participate in the care of this patient.  Patient/Family are satisfied with care plan and all questions have been answered.    Provider disclosure: Patient with at least one acute or chronic illness or injury that poses a threat to life or bodily function and is being managed actively during this encounter.  All of the below services have been performed independently by signing provider:  review of prior documentation from internal and or external health records.  Review of previous and current lab results.  Interview and comprehensive assessment during patient visit today. Review of current and previous chest radiographs/CT scans. Discussion of management and test interpretation with health care team and patient/family.   This document was prepared using Dragon voice recognition software and may include unintentional dictation errors.     Ottie Glazier, M.D.  Division of Pulmonary & Critical Care Medicine

## 2022-04-09 NOTE — Progress Notes (Signed)
PHARMACIST - PHYSICIAN COMMUNICATION DR:   TRH CONCERNING: Antibiotic IV to Oral Route Change Policy  RECOMMENDATION: This patient is receiving linezolid by the intravenous route.  Based on criteria approved by the Pharmacy and Therapeutics Committee, the antibiotic(s) is/are being converted to the equivalent oral dose form(s).   DESCRIPTION: These criteria include: Patient being treated for a respiratory tract infection, urinary tract infection, cellulitis or clostridium difficile associated diarrhea if on metronidazole The patient is not neutropenic and does not exhibit a GI malabsorption state The patient is eating (either orally or via tube) and/or has been taking other orally administered medications for a least 24 hours The patient is improving clinically and has a Tmax < 100.5  If you have questions about this conversion, please contact the Pharmacy Department  '[]'$   [phone] )  [facility] '[x]'$   [phone] )  [facility] '[]'$   [phone] )  [facility] '[]'$   [phone] )  [facility] '[]'$   [phone] )  [place], PharmD, BCPS, [place] Work Cell: [phone] 04/09/2022 4:06 PM  l

## 2022-04-10 DIAGNOSIS — E876 Hypokalemia: Secondary | ICD-10-CM | POA: Diagnosis not present

## 2022-04-10 DIAGNOSIS — K219 Gastro-esophageal reflux disease without esophagitis: Secondary | ICD-10-CM | POA: Diagnosis not present

## 2022-04-10 DIAGNOSIS — A419 Sepsis, unspecified organism: Secondary | ICD-10-CM | POA: Diagnosis not present

## 2022-04-10 DIAGNOSIS — R531 Weakness: Secondary | ICD-10-CM | POA: Diagnosis not present

## 2022-04-10 DIAGNOSIS — D72829 Elevated white blood cell count, unspecified: Secondary | ICD-10-CM | POA: Diagnosis not present

## 2022-04-10 DIAGNOSIS — R918 Other nonspecific abnormal finding of lung field: Secondary | ICD-10-CM | POA: Diagnosis not present

## 2022-04-10 LAB — CBC WITH DIFFERENTIAL/PLATELET
Abs Immature Granulocytes: 0.12 10*3/uL — ABNORMAL HIGH (ref 0.00–0.07)
Basophils Absolute: 0 10*3/uL (ref 0.0–0.1)
Basophils Relative: 0 %
Eosinophils Absolute: 0.2 10*3/uL (ref 0.0–0.5)
Eosinophils Relative: 2 %
HCT: 30.6 % — ABNORMAL LOW (ref 36.0–46.0)
Hemoglobin: 9.8 g/dL — ABNORMAL LOW (ref 12.0–15.0)
Immature Granulocytes: 1 %
Lymphocytes Relative: 7 %
Lymphs Abs: 0.7 10*3/uL (ref 0.7–4.0)
MCH: 28.3 pg (ref 26.0–34.0)
MCHC: 32 g/dL (ref 30.0–36.0)
MCV: 88.4 fL (ref 80.0–100.0)
Monocytes Absolute: 0.6 10*3/uL (ref 0.1–1.0)
Monocytes Relative: 7 %
Neutro Abs: 7.7 10*3/uL (ref 1.7–7.7)
Neutrophils Relative %: 83 %
Platelets: 384 10*3/uL (ref 150–400)
RBC: 3.46 MIL/uL — ABNORMAL LOW (ref 3.87–5.11)
RDW: 13.2 % (ref 11.5–15.5)
WBC: 9.3 10*3/uL (ref 4.0–10.5)
nRBC: 0 % (ref 0.0–0.2)

## 2022-04-10 LAB — BASIC METABOLIC PANEL
Anion gap: 9 (ref 5–15)
BUN: 9 mg/dL (ref 8–23)
CO2: 25 mmol/L (ref 22–32)
Calcium: 8.1 mg/dL — ABNORMAL LOW (ref 8.9–10.3)
Chloride: 105 mmol/L (ref 98–111)
Creatinine, Ser: 0.63 mg/dL (ref 0.44–1.00)
GFR, Estimated: >60 mL/min (ref >60)
Glucose, Bld: 113 mg/dL — ABNORMAL HIGH (ref 70–99)
Potassium: 3.8 mmol/L (ref 3.5–5.1)
Sodium: 139 mmol/L (ref 135–145)

## 2022-04-10 LAB — SURGICAL PATHOLOGY

## 2022-04-10 MED ORDER — LIDOCAINE VISCOUS HCL 2 % MT SOLN: 15.0000 mL | Freq: Four times a day (QID) | OROMUCOSAL | Status: DC | PRN

## 2022-04-10 MED ORDER — ALUM & MAG HYDROXIDE-SIMETH 200-200-20 MG/5ML PO SUSP: 30.0000 mL | Freq: Four times a day (QID) | ORAL | Status: DC | PRN

## 2022-04-10 NOTE — Plan of Care (Signed)

## 2022-04-10 NOTE — Progress Notes (Signed)
PULMONOLOGY         Date: 04/10/2022,   MRN# [id number] Selena Pittman Dec 28, 1950     AdmissionWeight: 55.3 kg                 CurrentWeight: 55.3 kg  Referring provider: Dr. Tobie Poet   CHIEF COMPLAINT:   Lung mass versus abscess   HISTORY OF PRESENT ILLNESS   This is a pleasant 71 year old female with a history of bipolar disorder, history of breast cancer, major depressive disorder, COPD with emphysema, dyslipidemia, osteoporosis, schizophrenia, thyroid nodules, vitamin D deficiency who came in due to undue fatigue and disequilibrium.  She apparently does have some confusion at baseline per family.  She did have a mechanical fall but denies having loss of consciousness or any injury.  She reports being too weak to be ambulatory and has been on bedrest for the last few days.  She does report having cough denies vomiting abdominal pain.  While in the ER she did have vitals done which were essentially normal except for tachypnea in the upper 20s.  She does have SpO2 of over 95% on room air.  She had urinalysis performed in the ER with a large amount of leukocyte Estrace and many bacteria consistent with possible UTI.  Her COVID-19 testing is negative.  CBC shows leukocytosis with a white count of 17,000, chronic anemia with a hemoglobin of 11 with a baseline around 15 and reactive thrombocytosis suggestive of ongoing bleed versus IDA.  CMP was performed with hypokalemia, hypoalbuminemia as well as transaminitis with increased AST. CT chest was performed which was reviewed by me independently with findings of right upper lobe anterior mass which is new from previous study which was abdominal CT scan performed in August with absence of this mass.  04/03/22-  patient seen and examined, remains on room air. Complains of abd pain but improved resp status. Discussed possible biopsy with patient. 04/06/22- patient reports minimal dyspnea.  She is on room air.  Her WBC count has improved  with Unasyn.  She reports ongoing abdominopelvic discomfort. She has abnormal UA suggestive of UTI with Klebsiella + urine culture that is resistant to multiple drugs. This may be cause of abd pain.  She has CT chest and abd done.  She has declined lung mass biopsy.  She seems to be responding to antibiotics so that may be acceptable to re-image later.  04/07/22- patient is stable, laying in bed comfortably.  She reports improvement in abdominal pain.  Very weak and deconditioned clinically.  Blood work essentially stable from prior with mild uptrend in wbc count. She remains on zyvox unasyn combination. Still refusing lung biopsy at this time. Will continue to follow.   04/08/22- Patient unchanged from prior. I spoke with POA son [name] , we reviewed imaging, labs , hospital course and findings. Reviewed plan for biopsy on hold until consent cleared. He will come in to get those signed today at [place] out to IR for biopsy schedule.   04/09/22- s/p Lung biopsy. Mild decrement in O2 suspect atelectasis. Have ordered IS and asked RN to encourage patient to use it.   04/10/22  -patient is resting in bed on 2L/min [place].  She shares breathing is at baseline with no SOB.  She has pathology pending from lung biopsy. She complains of abdominal pain. WBC count has normalized on CBC.  She is stable from pulmonary perspective for outpatient evaluation.   PAST MEDICAL HISTORY   Past Medical History:  Diagnosis Date   Bipolar affective ([place])    Breast cancer ([place])    Cancer ([place]) 12-11-14   INVASIVE MAMMARY CARCINOMA/.left breast/ T2 N0   Cardiomegaly    Depression    Emphysema of lung ([facility])    Endometriosis    Hyperlipidemia    Osteoporosis    Schizoaffective disorder ([facility])    Schizophrenia ([facility])    Thyroid nodule    Vitamin D deficiency      SURGICAL HISTORY   Past Surgical History:  Procedure Laterality Date   ABDOMINAL HYSTERECTOMY     BREAST BIOPSY Left 12-11-14   INVASIVE MAMMARY  CARCINOMA.   BREAST LUMPECTOMY WITH SENTINEL LYMPH NODE BIOPSY Left 12/31/2014   Procedure: LEFT BREAST LUMPECTOMY WITH ULTRASOUND GUIDED NEEDLE LOCALIZATION, SENTINEL LYMPH NODE BIOPSY ;  Surgeon: Christene Lye, MD;  Location: [facility] ORS;  Service: General;  Laterality: Left;   DIAGNOSTIC MAMMOGRAM  12/04/2014   Done at [facility] Imaging Category 5-Left Breast   DILATION AND CURETTAGE OF UTERUS     MASTECTOMY Left 2016   SIMPLE MASTECTOMY WITH AXILLARY SENTINEL NODE BIOPSY Left 01/14/2015   Procedure: SIMPLE MASTECTOMY;  Surgeon: Christene Lye, MD;  Location: [facility] ORS;  Service: General;  Laterality: Left;   TENDON REPAIR Right    hand   TONSILLECTOMY     TUBAL LIGATION       FAMILY HISTORY   Family History  Problem Relation Age of Onset   Cancer Mother        uterine   Heart attack Father    Breast cancer Neg Hx      SOCIAL HISTORY   Social History   Tobacco Use   Smoking status: Every Day    Packs/day: 0.20    Years: 45.00    Total pack years: 9.00    Types: Cigarettes   Smokeless tobacco: Never  Vaping Use   Vaping Use: Never used  Substance Use Topics   Alcohol use: No    Alcohol/week: 0.0 standard drinks of alcohol   Drug use: No     MEDICATIONS    Home Medication:     Current Medication:  Current Facility-Administered Medications:    0.9 %  sodium chloride infusion, , Intravenous, PRN, Pokhrel, Laxman, MD, Stopped at 04/08/22 1416   alum & mag hydroxide-simeth (MAALOX/MYLANTA) 200-200-20 MG/5ML suspension 30 mL, 30 mL, Oral, Q6H PRN **AND** lidocaine (XYLOCAINE) 2 % viscous mouth solution 15 mL, 15 mL, Oral, Q6H PRN, Eugenie Filler, MD   Ampicillin-Sulbactam (UNASYN) 3 g in sodium chloride 0.9 % 100 mL IVPB, 3 g, Intravenous, Q6H, Ravishankar, Jayashree, MD, Last Rate: 200 mL/hr at 04/10/22 0534, 3 g at 04/10/22 0534   cholecalciferol (VITAMIN D3) 25 MCG (1000 UNIT) tablet 2,000 Units, 2,000 Units, Oral, Daily, Cox, Amy N, DO, 2,000 Units at  04/10/22 0844   cloZAPine (CLOZARIL) tablet 400 mg, 400 mg, Oral, QHS, Cox, Amy N, DO, 400 mg at 04/09/22 2314   docusate sodium (COLACE) capsule 100 mg, 100 mg, Oral, BID, Pokhrel, Laxman, MD, 100 mg at 04/10/22 0845   hydrOXYzine (ATARAX) tablet 25 mg, 25 mg, Oral, BID, Cox, Amy N, DO, 25 mg at 04/10/22 0843   letrozole [facility]) tablet 2.5 mg, 2.5 mg, Oral, Daily, Cox, Amy N, DO, 2.5 mg at 04/10/22 0850   linaclotide (LINZESS) capsule 145 mcg, 145 mcg, Oral, QAC breakfast, Cox, Amy N, DO, 145 mcg at 04/10/22 0844   linezolid (ZYVOX) tablet 600 mg, 600 mg, Oral, Q12H, Zeigler,  Sandi Mealy, RPH, 600 mg at 04/10/22 0843   montelukast (SINGULAIR) tablet 5 mg, 5 mg, Oral, QHS, Vira Blanco, RPH, 5 mg at 04/09/22 2320   mupirocin ointment (BACTROBAN) 2 %, , Nasal, BID, Pokhrel, Laxman, MD, Given at 04/10/22 0850   nicotine (NICODERM CQ - dosed in mg/24 hours) patch 21 mg, 21 mg, Transdermal, Daily PRN, Cox, Amy N, DO, 21 mg at 04/07/22 5361   oxyCODONE (Oxy IR/ROXICODONE) immediate release tablet 5 mg, 5 mg, Oral, Q6H PRN, Pokhrel, Laxman, MD, 5 mg at 04/07/22 1113   pantoprazole (PROTONIX) EC tablet 40 mg, 40 mg, Oral, Daily, Cox, Amy N, DO, 40 mg at 04/10/22 0844   polyethylene glycol (MIRALAX / GLYCOLAX) packet 17 g, 17 g, Oral, Daily, Pokhrel, Laxman, MD, 17 g at 04/09/22 1115   senna-docusate (Senokot-S) tablet 1 tablet, 1 tablet, Oral, QHS PRN, Cox, Amy N, DO   simvastatin (ZOCOR) tablet 20 mg, 20 mg, Oral, Daily, Cox, Amy N, DO, 20 mg at 04/10/22 0845    ALLERGIES   Cat hair extract, Milk-related compounds, Mixed ragweed, and Peanut-containing drug products     REVIEW OF SYSTEMS    Review of Systems:  Gen:  Denies  fever, sweats, chills weigh loss  HEENT: Denies blurred vision, double vision, ear pain, eye pain, hearing loss, nose bleeds, sore throat Cardiac:  No dizziness, chest pain or heaviness, chest tightness,edema Resp:   reports dyspnea chronically  Gi: Denies swallowing  difficulty, stomach pain, nausea or vomiting, diarrhea, constipation, bowel incontinence Gu:  Denies bladder incontinence, burning urine Ext:   Denies Joint pain, stiffness or swelling Skin: Denies  skin rash, easy bruising or bleeding or hives Endoc:  Denies polyuria, polydipsia , polyphagia or weight change Psych:   Denies depression, insomnia or hallucinations   Other:  All other systems negative   VS: BP 101/70 (BP Location: Right Arm)   Pulse 91   Temp 97.8 F (36.6 C)   Resp 18   Ht '5\' 4"'$  (1.626 m)   Wt 55.3 kg   SpO2 97%   BMI 20.93 kg/m      PHYSICAL EXAM    GENERAL:NAD, no fevers, chills, no weakness no fatigue HEAD: Normocephalic, atraumatic.  EYES: Pupils equal, round, reactive to light. Extraocular muscles intact. No scleral icterus.  MOUTH: Moist mucosal membrane. Dentition intact. No abscess noted.  EAR, NOSE, THROAT: Clear without exudates. No external lesions.  NECK: Supple. No thyromegaly. No nodules. No JVD.  PULMONARY: decreased breath sounds with mild rhonchi worse at bases bilaterally.  CARDIOVASCULAR: S1 and S2. Regular rate and rhythm. No murmurs, rubs, or gallops. No edema. Pedal pulses 2+ bilaterally.  GASTROINTESTINAL: Soft, nontender, nondistended. No masses. Positive bowel sounds. No hepatosplenomegaly.  MUSCULOSKELETAL: No swelling, clubbing, or edema. Range of motion full in all extremities.  NEUROLOGIC: Cranial nerves II through XII are intact. No gross focal neurological deficits. Sensation intact. Reflexes intact.  SKIN: No ulceration, lesions, rashes, or cyanosis. Skin warm and dry. Turgor intact.  PSYCHIATRIC: Mood, affect within normal limits. The patient is awake, alert and oriented x 3. Insight, judgment intact.       IMAGING     ASSESSMENT/PLAN   Large anterior right lung mass -Appears to be new since August  -Patient does have moderate pretest probability for lung cancer -Treating as abscess at this moment -Consultation  infectious disease-input is appreciated. -Unable to produce respiratory culture -Procalcitonin trend is negative  -MRSA PCR -positive -Blood cultures x 2 have been ordered-  NTD -Aspiratory viral panel- negative  -Aspergillus ab - negative -urine histoplasma antigen-negative -cryptococcal antigen-negative -Sputum cytology for possible malignancy -Likely will need PET scan on outpatient basis -Due to altered mental status may consider MRI brain to rule out metastatic lesions- CT head is reassuring -s/p lung biopsy via IR service 06/10/20  Thank you for allowing me to participate in the care of this patient.  Patient/Family are satisfied with care plan and all questions have been answered.    Provider disclosure: Patient with at least one acute or chronic illness or injury that poses a threat to life or bodily function and is being managed actively during this encounter.  All of the below services have been performed independently by signing provider:  review of prior documentation from internal and or external health records.  Review of previous and current lab results.  Interview and comprehensive assessment during patient visit today. Review of current and previous chest radiographs/CT scans. Discussion of management and test interpretation with health care team and patient/family.   This document was prepared using Dragon voice recognition software and may include unintentional dictation errors.     Ottie Glazier, M.D.  Division of Pulmonary & Critical Care Medicine

## 2022-04-10 NOTE — Progress Notes (Signed)
PROGRESS NOTE    Selena Pittman  EHU:[id number] DOB: [birth year] DOA: 04/02/2022 PCP: Remi Haggard, FNP    Chief Complaint  Patient presents with   Weakness    Brief Narrative:  Selena Pittman is a 71 year old female with history of bipolar disorder,, hyperlipidemia, schizophrenia presented to the hospital with generalized weakness and ambulatory dysfunction.  In the ED patient had stable vitals.  Laboratory data showed potassium of 2.4.  WBC was elevated at 17.0.  COVID and influenza RSV PCR was negative.  Magnesium was 2.1.  Chest x-ray showed large possibly pleural-based mass projecting over the right perihilar region.  CT scan of the chest was recommended which showed a well-circumscribed homogeneous mass anteromedially in the right hemithorax with central low-density suspicious for necrosis. In the ED, patient received 1 L of IV fluid bolus, magnesium sulfate 2 g, potassium and was admitted to the hospital for further evaluation and treatment.  Pulmonary and ID was notified.  At this time patient is awaiting for CT-guided biopsy.  ID has started the patient on broad-spectrum antibiotic.    Assessment & Plan:   Principal Problem:   Mass of upper lobe of right lung Active Problems:   Malignant neoplasm of left female breast (HCC)   Sepsis ([place])   Tobacco use   GERD (gastroesophageal reflux disease)   Schizophrenia (HCC)   Leukocytosis   Hypokalemia   Necrotizing pneumonia (HCC)   Acute cystitis without hematuria  #1 pulmonary parenchymal mass -Noted on chest x-ray, follow-up CT with concern for right sided lesion with central necrosis. -At this time unable to rule out pulmonary abscess/atypical infection. -Respiratory viral panel negative. -Serum aspergillosis negative. -MRSA PCR positive from nares. -COVID-19 PCR negative, influenza AMB negative. -Procalcitonin negative -Blood cultures with no growth to date. -Cryptococcal antigen negative. --Patient seen  in consultation by pulmonary and ID. -Patient currently on broad-spectrum antibiotics of Unasyn and linezolid. -Pulmonary following and recommending outpatient PET scan. -IR consulted for CT-guided lung mass biopsy which was done, 04/09/2022 with biopsy for pathology and culture results pending.  -ID, pulmonary, IR following and appreciate their input and recommendations.  2.  Sepsis -Patient met criteria for sepsis on admission with tachypnea, tachycardia, leukocytosis of 17,000, CT chest/chest x-ray concerning for possible pulmonary abscess. -Blood cultures with no growth to date. -Urinalysis with 60,000 colonies of Klebsiella pneumonia which is sensitive to ampicillin sulbactam which patient is currently on.. -Currently afebrile. -Leukocytosis trending down currently at 9.3 from 10.6 from 11.3 from 17.0 on admission. -Continue empiric IV antibiotics of Unasyn, linezolid. -Patient underwent CT-guided lung biopsy, 04/09/2022 per IR with studies pending.  -ID following.  3.  Klebsiella UTI -Urine cultures with 60,000 colonies of Klebsiella sensitive to ampicillin sulbactam. -On IV Unasyn.    4.  GERD -PPI.  5.  Hypokalemia -Repleted. -Potassium at 3.8, magnesium at 2.1. -Repeat labs in the AM.  6.  Nonspecific abdominal pain/constipation -CT abdomen and pelvis with no acute findings but significant stool in the colon. -Continue current bowel regimen of MiraLAX daily, Colace twice daily. -Supportive care.  7.  Schizophrenia -Clozapine.   8.  Tobacco use -Tobacco cessation. -Nicotine patch.  9.  History of malignant neoplasm left breast -Letrozole.    DVT prophylaxis: SCDs, Code Status: Full Family Communication: Updated patient.  No family at bedside. Disposition: TBD  Status is: Inpatient Remains inpatient appropriate because: Severity of illness   Consultants:  IR: Dr. Earleen Newport 04/08/2022 Pulmonary: Dr. Lanney Gins 04/02/2022 Infectious disease: Dr.Ravishanker  04/03/2022  Procedures:  CT chest 04/02/2022 CT head CT C-spine 04/02/2022 CT abdomen and pelvis 04/03/2022 Chest x-ray 04/02/2022 CT right lung biopsy per IR, Dr.El-Abd 04/09/2022  Antimicrobials:  Anti-infectives (From admission, onward)    Start     Dose/Rate Route Frequency Ordered Stop   04/09/22 2200  linezolid (ZYVOX) tablet 600 mg        600 mg Oral Every 12 hours 04/09/22 1605     04/04/22 0000  Ampicillin-Sulbactam (UNASYN) 3 g in sodium chloride 0.9 % 100 mL IVPB        3 g 200 mL/hr over 30 Minutes Intravenous Every 6 hours 04/03/22 1403     04/03/22 1800  vancomycin (VANCOCIN) IVPB 1000 mg/200 mL premix  Status:  Discontinued        1,000 mg 200 mL/hr over 60 Minutes Intravenous Every 24 hours 04/02/22 1617 04/03/22 1403   04/03/22 1800  linezolid (ZYVOX) IVPB 600 mg  Status:  Discontinued        600 mg 300 mL/hr over 60 Minutes Intravenous Every 12 hours 04/03/22 1403 04/09/22 1605   04/03/22 0500  ceFEPIme (MAXIPIME) 2 g in sodium chloride 0.9 % 100 mL IVPB  Status:  Discontinued        2 g 200 mL/hr over 30 Minutes Intravenous Every 12 hours 04/02/22 1617 04/02/22 1642   04/02/22 1645  piperacillin-tazobactam (ZOSYN) IVPB 3.375 g        3.375 g 12.5 mL/hr over 240 Minutes Intravenous Every 8 hours 04/02/22 1642 04/03/22 1802   04/02/22 1600  ceFEPIme (MAXIPIME) 2 g in sodium chloride 0.9 % 100 mL IVPB  Status:  Discontinued        2 g 200 mL/hr over 30 Minutes Intravenous  Once 04/02/22 1554 04/02/22 1642   04/02/22 1500  Ampicillin-Sulbactam (UNASYN) 3 g in sodium chloride 0.9 % 100 mL IVPB  Status:  Discontinued        3 g 200 mL/hr over 30 Minutes Intravenous  Once 04/02/22 1452 04/02/22 1609   04/02/22 1500  vancomycin (VANCOCIN) IVPB 1000 mg/200 mL premix        1,000 mg 200 mL/hr over 60 Minutes Intravenous  Once 04/02/22 1452 04/02/22 1857         Subjective: Sleeping but arousable.  Diffuse GI cocktail helped some with the midsternal burning.   Complaints of abdominal pain ongoing for several months.  Denies any significant shortness of breath.  No chest pain.  States just does not feel well.  States she was surprised how well CT-guided biopsy went.    Objective: Vitals:   04/09/22 2331 04/10/22 0453 04/10/22 0756 04/10/22 1126  BP: 111/78 112/82 103/72 101/70  Pulse: (!) 101 92 90 91  Resp: _0 Temp: 98.8 F (37.1 C) (!) 97.4 F (36.3 C) 98.1 F (36.7 C) 97.8 F (36.6 C)  TempSrc: Oral     SpO2: 98% 97% 97% 97%  Weight:      Height:        Intake/Output Summary (Last 24 hours) at 04/10/2022 1152 Last data filed at 04/10/2022 1129 Gross per 24 hour  Intake 19689.8 ml  Output 1175 ml  Net 18514.8 ml    Filed Weights   04/02/22 1245  Weight: 55.3 kg    Examination:  General exam: NAD.  Frail appearing.   Respiratory system: CTA B anterior lung fields.  No wheezes, no crackles, no rhonchi.  Fair air movement.  Speaking in full sentences. Cardiovascular system: RRR no murmurs  rubs or gallops.  No JVD.  No lower extremity edema.  Gastrointestinal system: Abdomen is soft, nontender, nondistended, positive bowel sounds.  No rebound.  No guarding.  Central nervous system: Alert and oriented. No focal neurological deficits. Extremities: Symmetric 5 x 5 power. Skin: No rashes, lesions or ulcers Psychiatry: Judgement and insight appear normal. Mood & affect appropriate.     Data Reviewed: I have personally reviewed following labs and imaging studies  CBC: Recent Labs  Lab 04/04/22 0456 04/07/22 0437 04/08/22 0605 04/09/22 0510 04/10/22 0643  WBC 11.8* 12.1* 11.3* 10.6* 9.3  NEUTROABS  --  10.5*  --  8.9* 7.7  HGB 9.7* 10.3* 10.5* 10.1* 9.8*  HCT 29.7* 32.0* 32.5* 31.1* 30.6*  MCV 89.7 89.9 90.0 88.6 88.4  PLT 434* 424* 409* 403* 384     Basic Metabolic Panel: Recent Labs  Lab 04/04/22 0456 04/06/22 0258 04/07/22 0437 04/08/22 0605 04/09/22 0510 04/10/22 0643  NA 137 139 138 136 138 139   K 3.7 3.7 4.1 3.9 3.8 3.8  CL 108 105 104 106 104 105  CO2 _0 GLUCOSE 111* 111* 127* 119* 125* 113*  BUN 7* _1 CREATININE 0.46 0.62 0.59 0.60 0.61 0.63  CALCIUM 7.5* 8.0* 8.2* 8.1* 8.0* 8.1*  MG 2.1 1.9 2.0 2.2 2.1  --      GFR: Estimated Creatinine Clearance: 57.3 mL/min (by C-G formula based on SCr of 0.63 mg/dL).  Liver Function Tests: No results for input(s): "AST", "ALT", "ALKPHOS", "BILITOT", "PROT", "ALBUMIN" in the last 168 hours.   CBG: No results for input(s): "GLUCAP" in the last 168 hours.   Recent Results (from the past 240 hour(s))  Resp panel by RT-PCR (RSV, Flu A&B, Covid) Anterior Nasal Swab     Status: None   Collection Time: 04/02/22 12:56 PM   Specimen: Anterior Nasal Swab  Result Value Ref Range Status   SARS Coronavirus 2 by RT PCR NEGATIVE NEGATIVE Final    Comment: (NOTE) SARS-CoV-2 target nucleic acids are NOT DETECTED.  The SARS-CoV-2 RNA is generally detectable in upper respiratory specimens during the acute phase of infection. The lowest concentration of SARS-CoV-2 viral copies this assay can detect is 138 copies/mL. A negative result does not preclude SARS-Cov-2 infection and should not be used as the sole basis for treatment or other patient management decisions. A negative result may occur with  improper specimen collection/handling, submission of specimen other than nasopharyngeal swab, presence of viral mutation(s) within the areas targeted by this assay, and inadequate number of viral copies(<138 copies/mL). A negative result must be combined with clinical observations, patient history, and epidemiological information. The expected result is Negative.  Fact Sheet for Patients:  EntrepreneurPulse.com.au  Fact Sheet for Healthcare Providers:  IncredibleEmployment.be  This test is no t yet approved or cleared by the [place] FDA and  has been authorized for detection  and/or diagnosis of SARS-CoV-2 by FDA under an Emergency Use Authorization (EUA). This EUA will remain  in effect (meaning this test can be used) for the duration of the COVID-19 declaration under Section 564(b)(1) of the Act, 21 U.[place].section 360bbb-3(b)(1), unless the authorization is terminated  or revoked sooner.       Influenza A by PCR NEGATIVE NEGATIVE Final   Influenza B by PCR NEGATIVE NEGATIVE Final    Comment: (NOTE) The Xpert Xpress SARS-CoV-2/FLU/RSV plus assay is intended as an aid in the diagnosis of influenza from Nasopharyngeal swab specimens  and should not be used as a sole basis for treatment. Nasal washings and aspirates are unacceptable for Xpert Xpress SARS-CoV-2/FLU/RSV testing.  Fact Sheet for Patients: EntrepreneurPulse.com.au  Fact Sheet for Healthcare Providers: IncredibleEmployment.be  This test is not yet approved or cleared by the [place] FDA and has been authorized for detection and/or diagnosis of SARS-CoV-2 by FDA under an Emergency Use Authorization (EUA). This EUA will remain in effect (meaning this test can be used) for the duration of the COVID-19 declaration under Section 564(b)(1) of the Act, 21 U.[place]. section 360bbb-3(b)(1), unless the authorization is terminated or revoked.     Resp Syncytial Virus by PCR NEGATIVE NEGATIVE Final    Comment: (NOTE) Fact Sheet for Patients: EntrepreneurPulse.com.au  Fact Sheet for Healthcare Providers: IncredibleEmployment.be  This test is not yet approved or cleared by the [place] FDA and has been authorized for detection and/or diagnosis of SARS-CoV-2 by FDA under an Emergency Use Authorization (EUA). This EUA will remain in effect (meaning this test can be used) for the duration of the COVID-19 declaration under Section 564(b)(1) of the Act, 21 U.[place]ena[place]. section 360bbb-3(b)(1), unless the authorization is terminated  or revoked.  Performed at [facility], [address] [place], [place] [address]   Culture, blood (Routine X 2) w Reflex to ID Panel     Status: None   Collection Time: 04/02/22  3:50 PM   Specimen: BLOOD  Result Value Ref Range Status   Specimen Description BLOOD BLOOD LEFT ARM  Final   Special Requests   Final    BOTTLES DRAWN AEROBIC AND ANAEROBIC Blood Culture results may not be optimal due to an inadequate volume of blood received in culture bottles   Culture   Final    NO GROWTH 5 DAYS Performed at [facility], [address] [place], [place] [address]    Report Status 04/07/2022 FINAL  Final  Respiratory (~20 pathogens) panel by PCR     Status: None   Collection Time: 04/02/22  3:50 PM   Specimen: Respiratory  Result Value Ref Range Status   Adenovirus NOT DETECTED NOT DETECTED Final   Coronavirus 229E NOT DETECTED NOT DETECTED Final    Comment: (NOTE) The Coronavirus on the Respiratory Panel, DOES NOT test for the novel  Coronavirus (2019 nCoV)    Coronavirus HKU1 NOT DETECTED NOT DETECTED Final   Coronavirus NL63 NOT DETECTED NOT DETECTED Final   Coronavirus OC43 NOT DETECTED NOT DETECTED Final   Metapneumovirus NOT DETECTED NOT DETECTED Final   Rhinovirus / Enterovirus NOT DETECTED NOT DETECTED Final   Influenza A NOT DETECTED NOT DETECTED Final   Influenza B NOT DETECTED NOT DETECTED Final   Parainfluenza Virus 1 NOT DETECTED NOT DETECTED Final   Parainfluenza Virus 2 NOT DETECTED NOT DETECTED Final   Parainfluenza Virus 3 NOT DETECTED NOT DETECTED Final   Parainfluenza Virus 4 NOT DETECTED NOT DETECTED Final   Respiratory Syncytial Virus NOT DETECTED NOT DETECTED Final   Bordetella pertussis NOT DETECTED NOT DETECTED Final   Bordetella Parapertussis NOT DETECTED NOT DETECTED Final   Chlamydophila pneumoniae NOT DETECTED NOT DETECTED Final   Mycoplasma pneumoniae NOT DETECTED NOT DETECTED Final    Comment: Performed at [facility], [place] [address], [place], [place] [address]  Culture, blood (Routine X 2) w Reflex to ID Panel     Status: None   Collection Time: 04/02/22  4:52 PM   Specimen: BLOOD  Result Value Ref Range Status  Specimen Description BLOOD BLOOD RIGHT ARM  Final   Special Requests   Final    BOTTLES DRAWN AEROBIC AND ANAEROBIC Blood Culture adequate volume   Culture   Final    NO GROWTH 5 DAYS Performed at [facility], [address] [place], [place] [address]    Report Status 04/07/2022 FINAL  Final  MRSA Next Gen by PCR, Nasal     Status: Abnormal   Collection Time: 04/02/22  5:50 PM  Result Value Ref Range Status   MRSA by PCR Next Gen DETECTED (A) NOT DETECTED Final    Comment: RESULT CALLED TO, READ BACK BY AND VERIFIED WITH: Butler Denmark _0  on 03/03/22 skl (NOTE) The GeneXpert MRSA Assay (FDA approved for NASAL specimens only), is one component of a comprehensive MRSA colonization surveillance program. It is not intended to diagnose MRSA infection nor to guide or monitor treatment for MRSA infections. Test performance is not FDA approved in patients less than 92 years old. Performed at [facility], [address] [place], [place] [address]   Aspergillus Ag, BAL/Serum     Status: None   Collection Time: 04/03/22  4:46 AM   Specimen: Vein  Result Value Ref Range Status   Aspergillus Ag, BAL/Serum 0.04 0.00 - 0.49 Index Final    Comment: (NOTE) Performed At: [facility] [address] [place], [place] [id number] Rush Farmer MD PT:[id number]   Urine Culture     Status: Abnormal   Collection Time: 04/03/22  5:18 PM   Specimen: Urine, Random  Result Value Ref Range Status   Specimen Description   Final    URINE, RANDOM Performed at [facility], [address] [place], [place] [address]    Special Requests   Final    NONE Performed at [facility], [place], [place] [address]    Culture 60,000 COLONIES/mL  KLEBSIELLA PNEUMONIAE (A)  Final   Report Status 04/06/2022 FINAL  Final   Organism ID, Bacteria KLEBSIELLA PNEUMONIAE (A)  Final      Susceptibility   Klebsiella pneumoniae - MIC*    AMPICILLIN RESISTANT Resistant     CEFAZOLIN <=4 SENSITIVE Sensitive     CEFEPIME <=0.12 SENSITIVE Sensitive     CEFTRIAXONE <=0.25 SENSITIVE Sensitive     CIPROFLOXACIN <=0.25 SENSITIVE Sensitive     GENTAMICIN <=1 SENSITIVE Sensitive     IMIPENEM <=0.25 SENSITIVE Sensitive     NITROFURANTOIN <=16 SENSITIVE Sensitive     TRIMETH/SULFA <=20 SENSITIVE Sensitive     AMPICILLIN/SULBACTAM 4 SENSITIVE Sensitive     PIP/TAZO <=4 SENSITIVE Sensitive     * 60,000 COLONIES/mL KLEBSIELLA PNEUMONIAE         Radiology Studies: CT LUNG MASS BIOPSY  Result Date: 04/09/2022 INDICATION: Right lung mass EXAM: CT-guided core needle biopsy of right lung mass MEDICATIONS: None. ANESTHESIA/SEDATION: Moderate (conscious) sedation was employed during this procedure. A total of Versed 0.5 mg and Fentanyl 25 mcg was administered intravenously. Moderate Sedation Time: 16 minutes. The patient's level of consciousness and vital signs were monitored continuously by radiology nursing throughout the procedure under my direct supervision. FLUOROSCOPY TIME:  N/a COMPLICATIONS: None immediate. PROCEDURE: Informed written consent was obtained from the patient after a thorough discussion of the procedural risks, benefits and alternatives. All questions were addressed. Maximal Sterile Barrier Technique was utilized including caps, mask, sterile gowns, sterile gloves, sterile drape, hand hygiene and skin antiseptic. A timeout was performed prior to the initiation of the procedure. The  patient was placed supine on the exam table. Limited CT of the chest was performed for planning purposes. This demonstrated right lung mass. The inferior portion of the mass was targeted for biopsy as this seemed less centrally necrotic. Skin entry site was marked,  and the overlying skin was prepped and draped in the standard sterile fashion. Local analgesia was obtained with 1% lidocaine. Using intermittent CT fluoroscopy, a 17 gauge introducer needle was advanced towards the identified lesion. Subsequently, core needle biopsy was performed using an 18 gauge core biopsy device times multiple passes. Specimens were submitted in formalin and saline for pathology and microbiology analysis, respectively. Limited postprocedure imaging demonstrated no complicating feature. The patient tolerated the procedure well, and was transferred to recovery in stable condition. IMPRESSION: Successful CT-guided core needle biopsy of right lung mass. Specimens sent for both pathology and microbiology analysis. Electronically Signed   By: Albin Felling M.D.   On: 04/09/2022 13:21   DG Chest Port 1 View  Result Date: 04/09/2022 CLINICAL DATA:  RIGHT-sided lung mass, cough, post lung biopsy EXAM: PORTABLE CHEST 1 VIEW COMPARISON:  CT chest 04/02/2022 FINDINGS: Normal heart size, mediastinal contours, and pulmonary vascularity. Large RIGHT upper lobe mass again identified 8.7 x 9.1 cm. Remaining lungs clear. No acute infiltrate, pleural effusion, or pneumothorax. IMPRESSION: No pneumothorax following lung biopsy. Large RIGHT upper lobe mass. Electronically Signed   By: Lavonia [name] M.D.   On: 04/09/2022 10:04        Scheduled Meds:  cholecalciferol  2,000 Units Oral Daily   cloZAPine  400 mg Oral QHS   docusate sodium  100 mg Oral BID   hydrOXYzine  25 mg Oral BID   letrozole  2.5 mg Oral Daily   linaclotide  145 mcg Oral QAC breakfast   linezolid  600 mg Oral Q12H   montelukast  5 mg Oral QHS   mupirocin ointment   Nasal BID   pantoprazole  40 mg Oral Daily   polyethylene glycol  17 g Oral Daily   simvastatin  20 mg Oral Daily   Continuous Infusions:  sodium chloride Stopped (04/08/22 1416)   ampicillin-sulbactam (UNASYN) IV 3 g (04/10/22 0534)     LOS: 8 days     Time spent: 40 minutes    Irine Seal, MD [facility]   To contact the attending provider between 7A-7P or the covering provider during after hours 7P-7A, please log into the web site [email or web address] and access using universal [place] password for that web site. If you do not have the password, please call the hospital operator.  04/10/2022, 11:52 AM

## 2022-04-11 DIAGNOSIS — E876 Hypokalemia: Secondary | ICD-10-CM | POA: Diagnosis not present

## 2022-04-11 DIAGNOSIS — A419 Sepsis, unspecified organism: Secondary | ICD-10-CM | POA: Diagnosis not present

## 2022-04-11 DIAGNOSIS — K219 Gastro-esophageal reflux disease without esophagitis: Secondary | ICD-10-CM | POA: Diagnosis not present

## 2022-04-11 DIAGNOSIS — D72829 Elevated white blood cell count, unspecified: Secondary | ICD-10-CM | POA: Diagnosis not present

## 2022-04-11 DIAGNOSIS — R918 Other nonspecific abnormal finding of lung field: Secondary | ICD-10-CM | POA: Diagnosis not present

## 2022-04-11 DIAGNOSIS — R531 Weakness: Secondary | ICD-10-CM | POA: Diagnosis not present

## 2022-04-11 LAB — BASIC METABOLIC PANEL
Anion gap: 9 (ref 5–15)
BUN: 10 mg/dL (ref 8–23)
CO2: 24 mmol/L (ref 22–32)
Calcium: 7.9 mg/dL — ABNORMAL LOW (ref 8.9–10.3)
Chloride: 104 mmol/L (ref 98–111)
Creatinine, Ser: 0.55 mg/dL (ref 0.44–1.00)
GFR, Estimated: >60 mL/min (ref >60)
Glucose, Bld: 112 mg/dL — ABNORMAL HIGH (ref 70–99)
Potassium: 3.8 mmol/L (ref 3.5–5.1)
Sodium: 137 mmol/L (ref 135–145)

## 2022-04-11 LAB — CBC WITH DIFFERENTIAL/PLATELET
Abs Immature Granulocytes: 0.09 10*3/uL — ABNORMAL HIGH (ref 0.00–0.07)
Basophils Absolute: 0 10*3/uL (ref 0.0–0.1)
Basophils Relative: 0 %
Eosinophils Absolute: 0.2 10*3/uL (ref 0.0–0.5)
Eosinophils Relative: 2 %
HCT: 29.6 % — ABNORMAL LOW (ref 36.0–46.0)
Hemoglobin: 9.6 g/dL — ABNORMAL LOW (ref 12.0–15.0)
Immature Granulocytes: 1 %
Lymphocytes Relative: 7 %
Lymphs Abs: 0.6 10*3/uL — ABNORMAL LOW (ref 0.7–4.0)
MCH: 28.7 pg (ref 26.0–34.0)
MCHC: 32.4 g/dL (ref 30.0–36.0)
MCV: 88.4 fL (ref 80.0–100.0)
Monocytes Absolute: 0.5 10*3/uL (ref 0.1–1.0)
Monocytes Relative: 5 %
Neutro Abs: 7.3 10*3/uL (ref 1.7–7.7)
Neutrophils Relative %: 85 %
Platelets: 371 10*3/uL (ref 150–400)
RBC: 3.35 MIL/uL — ABNORMAL LOW (ref 3.87–5.11)
RDW: 13 % (ref 11.5–15.5)
WBC: 8.6 10*3/uL (ref 4.0–10.5)
nRBC: 0 % (ref 0.0–0.2)

## 2022-04-11 MED ORDER — AMOXICILLIN-POT CLAVULANATE 875-125 MG PO TABS
1.0000 | ORAL_TABLET | Freq: Two times a day (BID) | ORAL | Status: DC
  Administered 2022-04-12: 1 via ORAL
  Filled 2022-04-11: qty 1

## 2022-04-11 MED ORDER — SULFAMETHOXAZOLE-TRIMETHOPRIM 800-160 MG PO TABS
1.0000 | ORAL_TABLET | Freq: Two times a day (BID) | ORAL | Status: DC
  Administered 2022-04-11 – 2022-04-12 (×2): 1 via ORAL
  Filled 2022-04-11 (×3): qty 1

## 2022-04-11 NOTE — Progress Notes (Signed)
Chart reviewed Pathology of lung biopsy REACTIVE FIBROUS STROMA AND FOCAL SKELETAL MUSCLE WITH ACUTE INFLAMMATION, ABSCESS FORMATION, AND NECROINFLAMMATORY DEBRIS.  - GRAM STAIN HIGHLIGHTS GRAM POSITIVE COCCI IN NECROINFLAMMATORY DEBRIS.  - AFB AND GMS STAINS ARE NEGATIVE.  - NEGATIVE FOR GRANULOMAS, VASCULITIS, AND MALIGNANCY.  Will ask lab to send the patholgy slide for NGS 9 this cannot be done till Tuesday) but gram positive cocci usually means staph/strp/or enterococcus Pt currently on linezolid and unasyn No change in CXR in 8 days  Cannot given linezolid for long- So would change to Bactrim DS 1 BID and unasyn to augmentin 875 mg 1 BID until 05/08/22 While on antibiotics will need weekly CMP and CBC labs and fax to Dr.Graelyn Bihl at [phone]  follow up appt with Drelyn Pistilli on 05/05/22 at 10.15 Am

## 2022-04-11 NOTE — Progress Notes (Signed)
PROGRESS NOTE    Selena Pittman  HRC:[id number] DOB: 05-Nov-1950 DOA: 04/02/2022 PCP: Remi Haggard, FNP    Chief Complaint  Patient presents with   Weakness    Brief Narrative:  s. Selena Pittman is a 71 year old female with history of bipolar disorder,, hyperlipidemia, schizophrenia presented to the hospital with generalized weakness and ambulatory dysfunction.  In the ED patient had stable vitals.  Laboratory data showed potassium of 2.4.  WBC was elevated at 17.0.  COVID and influenza RSV PCR was negative.  Magnesium was 2.1.  Chest x-ray showed large possibly pleural-based mass projecting over the right perihilar region.  CT scan of the chest was recommended which showed a well-circumscribed homogeneous mass anteromedially in the right hemithorax with central low-density suspicious for necrosis. In the ED, patient received 1 L of IV fluid bolus, magnesium sulfate 2 g, potassium and was admitted to the hospital for further evaluation and treatment.  Pulmonary and ID was notified.  At this time patient is awaiting for CT-guided biopsy.  ID has started the patient on broad-spectrum antibiotic.    Assessment & Plan:   Principal Problem:   Mass of upper lobe of right lung Active Problems:   Malignant neoplasm of left female breast (HCC)   Sepsis ([place])   Tobacco use   GERD (gastroesophageal reflux disease)   Schizophrenia (HCC)   Leukocytosis   Hypokalemia   Necrotizing pneumonia (HCC)   Acute cystitis without hematuria  #1 pulmonary parenchymal mass -Noted on chest x-ray, follow-up CT with concern for right sided lesion with central necrosis. -At this time unable to rule out pulmonary abscess/atypical infection. -Respiratory viral panel negative. -Serum aspergillosis negative. -MRSA PCR positive from nares. -COVID-19 PCR negative, influenza AMB negative. -Procalcitonin negative -Blood cultures with no growth to date. -Cryptococcal antigen negative. --Patient seen  in consultation by pulmonary and ID. -Patient currently on broad-spectrum antibiotics of Unasyn and linezolid. -Pulmonary following and recommending outpatient PET scan. -IR consulted for CT-guided lung mass biopsy which was done, 04/09/2022 with Surgical pathology with reactive fibrous stroma and focal skeletal muscle with acute inflammation, abscess formation, necroinflammatory debris.  Gram stain highlights GPC and necroinflammatory debris.  AFB and GMS stains are negative.  Negative for granulomas, vasculitis or malignancy. -ID, pulmonary, following and appreciate their input and recommendations.  2.  Sepsis -Patient met criteria for sepsis on admission with tachypnea, tachycardia, leukocytosis of 17,000, CT chest/chest x-ray concerning for possible pulmonary abscess. -Blood cultures with no growth to date. -Urinalysis with 60,000 colonies of Klebsiella pneumonia which is sensitive to ampicillin sulbactam which patient is currently on.. -Currently afebrile. -Leukocytosis trending down currently at 8.6 from 9.3 from 10.6 from 11.3 from 17.0 on admission. -Continue empiric IV antibiotics of Unasyn, linezolid. -Patient underwent CT-guided lung biopsy, 04/09/2022 per IR with studies pending.  -Surgical pathology with reactive fibrous stroma and focal skeletal muscle with acute inflammation, abscess formation, necroinflammatory debris.  Gram stain highlights GPC and necroinflammatory debris.  AFB and GMS stains are negative.  Negative for granulomas, vasculitis or malignancy. -ID following and recommending on discharge to change patient to Bactrim DS twice daily and to change Unasyn to Augmentin 875 twice daily through 05/08/2022 with outpatient follow-up. -Appreciate ID input and recommendations..  3.  Klebsiella UTI -Urine cultures with 60,000 colonies of Klebsiella sensitive to ampicillin sulbactam. -On IV Unasyn.    4.  GERD -Continue PPI.  5.  Hypokalemia -Repleted. -Potassium 3.8.   Magnesium noted at 2.1.   -Repeat labs in the AM.  6.  Nonspecific abdominal pain/constipation -CT abdomen and pelvis with no acute findings but significant stool in the colon. -Continue current bowel regimen of MiraLAX daily, Colace twice daily. -Supportive care.  7.  Schizophrenia -Clozapine.   8.  Tobacco use -Tobacco cessation. -Nicotine patch.  9.  History of malignant neoplasm left breast -Continue letrozole.    DVT prophylaxis: SCDs, Code Status: Full Family Communication: Updated patient.  No family at bedside. Disposition: TBD  Status is: Inpatient Remains inpatient appropriate because: Severity of illness   Consultants:  IR: Dr. Earleen Newport 04/08/2022 Pulmonary: Dr. Lanney Gins 04/02/2022 Infectious disease: Dr.Ravishanker 04/03/2022  Procedures:  CT chest 04/02/2022 CT head CT C-spine 04/02/2022 CT abdomen and pelvis 04/03/2022 Chest x-ray 04/02/2022 CT right lung biopsy per IR, Dr.El-Abd 04/09/2022  Antimicrobials:  Anti-infectives (From admission, onward)    Start     Dose/Rate Route Frequency Ordered Stop   04/09/22 2200  linezolid (ZYVOX) tablet 600 mg        600 mg Oral Every 12 hours 04/09/22 1605     04/04/22 0000  Ampicillin-Sulbactam (UNASYN) 3 g in sodium chloride 0.9 % 100 mL IVPB        3 g 200 mL/hr over 30 Minutes Intravenous Every 6 hours 04/03/22 1403     04/03/22 1800  vancomycin (VANCOCIN) IVPB 1000 mg/200 mL premix  Status:  Discontinued        1,000 mg 200 mL/hr over 60 Minutes Intravenous Every 24 hours 04/02/22 1617 04/03/22 1403   04/03/22 1800  linezolid (ZYVOX) IVPB 600 mg  Status:  Discontinued        600 mg 300 mL/hr over 60 Minutes Intravenous Every 12 hours 04/03/22 1403 04/09/22 1605   04/03/22 0500  ceFEPIme (MAXIPIME) 2 g in sodium chloride 0.9 % 100 mL IVPB  Status:  Discontinued        2 g 200 mL/hr over 30 Minutes Intravenous Every 12 hours 04/02/22 1617 04/02/22 1642   04/02/22 1645  piperacillin-tazobactam (ZOSYN) IVPB  3.375 g        3.375 g 12.5 mL/hr over 240 Minutes Intravenous Every 8 hours 04/02/22 1642 04/03/22 1802   04/02/22 1600  ceFEPIme (MAXIPIME) 2 g in sodium chloride 0.9 % 100 mL IVPB  Status:  Discontinued        2 g 200 mL/hr over 30 Minutes Intravenous  Once 04/02/22 1554 04/02/22 1642   04/02/22 1500  Ampicillin-Sulbactam (UNASYN) 3 g in sodium chloride 0.9 % 100 mL IVPB  Status:  Discontinued        3 g 200 mL/hr over 30 Minutes Intravenous  Once 04/02/22 1452 04/02/22 1609   04/02/22 1500  vancomycin (VANCOCIN) IVPB 1000 mg/200 mL premix        1,000 mg 200 mL/hr over 60 Minutes Intravenous  Once 04/02/22 1452 04/02/22 1857         Subjective: Laying in bed.  States does not feel too well.  However denies any chest pain, no shortness of breath.  Abdominal pain ongoing but seems to be improving and chronic in nature.  Tolerating current diet.     Objective: Vitals:   04/11/22 0047 04/11/22 0324 04/11/22 0813 04/11/22 1121  BP: 108/66 98/66 107/80 107/71  Pulse: 93 94 94 95  Resp: _0 Temp: 99.2 F (37.3 C) 98.6 F (37 C) 97.8 F (36.6 C) 97.7 F (36.5 C)  TempSrc: Axillary  Axillary   SpO2: 94% 95% 96% 97%  Weight:  Height:        Intake/Output Summary (Last 24 hours) at 04/11/2022 1130 Last data filed at 04/11/2022 0814 Gross per 24 hour  Intake --  Output 900 ml  Net -900 ml    Filed Weights   04/02/22 1245  Weight: 55.3 kg    Examination:  General exam: NAD.  Frail appearing. Respiratory system: Lungs clear to auscultation bilaterally.  No wheezes, no crackles, no rhonchi.  Fair air movement.  Speaking in full sentences.  Cardiovascular system: Regular rate rhythm no murmurs rubs or gallops.  No JVD.  No lower extremity edema.  Gastrointestinal system: Abdomen is soft, nontender, nondistended, positive bowel sounds.  No rebound.  No guarding.  Central nervous system: Alert and oriented. No focal neurological deficits. Extremities:  Symmetric 5 x 5 power. Skin: No rashes, lesions or ulcers Psychiatry: Judgement and insight appear normal. Mood & affect appropriate.     Data Reviewed: I have personally reviewed following labs and imaging studies  CBC: Recent Labs  Lab 04/07/22 0437 04/08/22 0605 04/09/22 0510 04/10/22 0643 04/11/22 0540  WBC 12.1* 11.3* 10.6* 9.3 8.6  NEUTROABS 10.5*  --  8.9* 7.7 7.3  HGB 10.3* 10.5* 10.1* 9.8* 9.6*  HCT 32.0* 32.5* 31.1* 30.6* 29.6*  MCV 89.9 90.0 88.6 88.4 88.4  PLT 424* 409* 403* 384 371     Basic Metabolic Panel: Recent Labs  Lab 04/06/22 0258 04/07/22 0437 04/08/22 0605 04/09/22 0510 04/10/22 0643 04/11/22 0540  NA 139 138 136 138 139 137  K 3.7 4.1 3.9 3.8 3.8 3.8  CL 105 104 106 104 105 104  CO2 _0 GLUCOSE 111* 127* 119* 125* 113* 112*  BUN _1 CREATININE 0.62 0.59 0.60 0.61 0.63 0.55  CALCIUM 8.0* 8.2* 8.1* 8.0* 8.1* 7.9*  MG 1.9 2.0 2.2 2.1  --   --      GFR: Estimated Creatinine Clearance: 57.3 mL/min (by C-G formula based on SCr of 0.55 mg/dL).  Liver Function Tests: No results for input(s): "AST", "ALT", "ALKPHOS", "BILITOT", "PROT", "ALBUMIN" in the last 168 hours.   CBG: No results for input(s): "GLUCAP" in the last 168 hours.   Recent Results (from the past 240 hour(s))  Resp panel by RT-PCR (RSV, Flu A&B, Covid) Anterior Nasal Swab     Status: None   Collection Time: 04/02/22 12:56 PM   Specimen: Anterior Nasal Swab  Result Value Ref Range Status   SARS Coronavirus 2 by RT PCR NEGATIVE NEGATIVE Final    Comment: (NOTE) SARS-CoV-2 target nucleic acids are NOT DETECTED.  The SARS-CoV-2 RNA is generally detectable in upper respiratory specimens during the acute phase of infection. The lowest concentration of SARS-CoV-2 viral copies this assay can detect is 138 copies/mL. A negative result does not preclude SARS-Cov-2 infection and should not be used as the sole basis for treatment or other patient  management decisions. A negative result may occur with  improper specimen collection/handling, submission of specimen other than nasopharyngeal swab, presence of viral mutation(s) within the areas targeted by this assay, and inadequate number of viral copies(<138 copies/mL). A negative result must be combined with clinical observations, patient history, and epidemiological information. The expected result is Negative.  Fact Sheet for Patients:  EntrepreneurPulse.com.au  Fact Sheet for Healthcare Providers:  IncredibleEmployment.be  This test is no t yet approved or cleared by the [place] FDA and  has been authorized for detection and/or diagnosis of SARS-CoV-2 by  FDA under an Emergency Use Authorization (EUA). This EUA will remain  in effect (meaning this test can be used) for the duration of the COVID-19 declaration under Section 564(b)(1) of the Act, 21 U.S.C.section 360bbb-3(b)(1), unless the authorization is terminated  or revoked sooner.       Influenza A by PCR NEGATIVE NEGATIVE Final   Influenza B by PCR NEGATIVE NEGATIVE Final    Comment: (NOTE) The Xpert Xpress SARS-CoV-2/FLU/RSV plus assay is intended as an aid in the diagnosis of influenza from Nasopharyngeal swab specimens and should not be used as a sole basis for treatment. Nasal washings and aspirates are unacceptable for Xpert Xpress SARS-CoV-2/FLU/RSV testing.  Fact Sheet for Patients: EntrepreneurPulse.com.au  Fact Sheet for Healthcare Providers: IncredibleEmployment.be  This test is not yet approved or cleared by the [place] FDA and has been authorized for detection and/or diagnosis of SARS-CoV-2 by FDA under an Emergency Use Authorization (EUA). This EUA will remain in effect (meaning this test can be used) for the duration of the COVID-19 declaration under Section 564(b)(1) of the Act, 21 U.S.C. section 360bbb-3(b)(1),  unless the authorization is terminated or revoked.     Resp Syncytial Virus by PCR NEGATIVE NEGATIVE Final    Comment: (NOTE) Fact Sheet for Patients: EntrepreneurPulse.com.au  Fact Sheet for Healthcare Providers: IncredibleEmployment.be  This test is not yet approved or cleared by the [place] FDA and has been authorized for detection and/or diagnosis of SARS-CoV-2 by FDA under an Emergency Use Authorization (EUA). This EUA will remain in effect (meaning this test can be used) for the duration of the COVID-19 declaration under Section 564(b)(1) of the Act, 21 U.S.C. section 360bbb-3(b)(1), unless the authorization is terminated or revoked.  Performed at [facility], [address] [place], [place] [address]   Culture, blood (Routine X 2) w Reflex to ID Panel     Status: None   Collection Time: 04/02/22  3:50 PM   Specimen: BLOOD  Result Value Ref Range Status   Specimen Description BLOOD BLOOD LEFT ARM  Final   Special Requests   Final    BOTTLES DRAWN AEROBIC AND ANAEROBIC Blood Culture results may not be optimal due to an inadequate volume of blood received in culture bottles   Culture   Final    NO GROWTH 5 DAYS Performed at [facility], [address] [place], [place] [address]    Report Status 04/07/2022 FINAL  Final  Respiratory (~20 pathogens) panel by PCR     Status: None   Collection Time: 04/02/22  3:50 PM   Specimen: Respiratory  Result Value Ref Range Status   Adenovirus NOT DETECTED NOT DETECTED Final   Coronavirus 229E NOT DETECTED NOT DETECTED Final    Comment: (NOTE) The Coronavirus on the Respiratory Panel, DOES NOT test for the novel  Coronavirus (2019 nCoV)    Coronavirus HKU1 NOT DETECTED NOT DETECTED Final   Coronavirus NL63 NOT DETECTED NOT DETECTED Final   Coronavirus OC43 NOT DETECTED NOT DETECTED Final   Metapneumovirus NOT DETECTED NOT DETECTED Final   Rhinovirus / Enterovirus  NOT DETECTED NOT DETECTED Final   Influenza A NOT DETECTED NOT DETECTED Final   Influenza B NOT DETECTED NOT DETECTED Final   Parainfluenza Virus 1 NOT DETECTED NOT DETECTED Final   Parainfluenza Virus 2 NOT DETECTED NOT DETECTED Final   Parainfluenza Virus 3 NOT DETECTED NOT DETECTED Final   Parainfluenza Virus 4 NOT DETECTED NOT DETECTED Final   Respiratory Syncytial Virus NOT DETECTED  NOT DETECTED Final   Bordetella pertussis NOT DETECTED NOT DETECTED Final   Bordetella Parapertussis NOT DETECTED NOT DETECTED Final   Chlamydophila pneumoniae NOT DETECTED NOT DETECTED Final   Mycoplasma pneumoniae NOT DETECTED NOT DETECTED Final    Comment: Performed at [facility], [address], [place], [place] [address]  Culture, blood (Routine X 2) w Reflex to ID Panel     Status: None   Collection Time: 04/02/22  4:52 PM   Specimen: BLOOD  Result Value Ref Range Status   Specimen Description BLOOD BLOOD RIGHT ARM  Final   Special Requests   Final    BOTTLES DRAWN AEROBIC AND ANAEROBIC Blood Culture adequate volume   Culture   Final    NO GROWTH 5 DAYS Performed at [facility], [address] [place],  [address]    Report Status 04/07/2022 FINAL  Final  MRSA Next Gen by PCR, Nasal     Status: Abnormal   Collection Time: 04/02/22  5:50 PM  Result Value Ref Range Status   MRSA by PCR Next Gen DETECTED (A) NOT DETECTED Final    Comment: RESULT CALLED TO, READ BACK BY AND VERIFIED WITH: Butler Denmark _0  on 03/03/22 skl (NOTE) The GeneXpert MRSA Assay (FDA approved for NASAL specimens only), is one component of a comprehensive MRSA colonization surveillance program. It is not intended to diagnose MRSA infection nor to guide or monitor treatment for MRSA infections. Test performance is not FDA approved in patients less than 54 years old. Performed at [facility], [address] [place], [place] [address]   Aspergillus Ag, BAL/Serum     Status: None    Collection Time: 04/03/22  4:46 AM   Specimen: Vein  Result Value Ref Range Status   Aspergillus Ag, BAL/Serum 0.04 0.00 - 0.49 Index Final    Comment: (NOTE) Performed At: [facility] [address] [place], [place] [id number] Rush Farmer MD BU:[id number]   Urine Culture     Status: Abnormal   Collection Time: 04/03/22  5:18 PM   Specimen: Urine, Random  Result Value Ref Range Status   Specimen Description   Final    URINE, RANDOM Performed at [facility], [address] [place], [place] [address]    Special Requests   Final    NONE Performed at [facility], [place], [place] [address]    Culture 60,000 COLONIES/mL KLEBSIELLA PNEUMONIAE (A)  Final   Report Status 04/06/2022 FINAL  Final   Organism ID, Bacteria KLEBSIELLA PNEUMONIAE (A)  Final      Susceptibility   Klebsiella pneumoniae - MIC*    AMPICILLIN RESISTANT Resistant     CEFAZOLIN <=4 SENSITIVE Sensitive     CEFEPIME <=0.12 SENSITIVE Sensitive     CEFTRIAXONE <=0.25 SENSITIVE Sensitive     CIPROFLOXACIN <=0.25 SENSITIVE Sensitive     GENTAMICIN <=1 SENSITIVE Sensitive     IMIPENEM <=0.25 SENSITIVE Sensitive     NITROFURANTOIN <=16 SENSITIVE Sensitive     TRIMETH/SULFA <=20 SENSITIVE Sensitive     AMPICILLIN/SULBACTAM 4 SENSITIVE Sensitive     PIP/TAZO <=4 SENSITIVE Sensitive     * 60,000 COLONIES/mL KLEBSIELLA PNEUMONIAE         Radiology Studies: No results found.      Scheduled Meds:  cholecalciferol  2,000 Units Oral Daily   cloZAPine  400 mg Oral QHS   docusate sodium  100 mg Oral BID   hydrOXYzine  25 mg Oral  BID   letrozole  2.5 mg Oral Daily   linaclotide  145 mcg Oral QAC breakfast   linezolid  600 mg Oral Q12H   montelukast  5 mg Oral QHS   mupirocin ointment   Nasal BID   pantoprazole  40 mg Oral Daily   polyethylene glycol  17 g Oral Daily   simvastatin  20 mg Oral Daily   Continuous Infusions:  sodium chloride Stopped (04/08/22  1416)   ampicillin-sulbactam (UNASYN) IV 3 g (04/11/22 0551)     LOS: 9 days    Time spent: 40 minutes    Irine Seal, MD [facility]   To contact the attending provider between 7A-7P or the covering provider during after hours 7P-7A, please log into the web site [email or web address] and access using universal [place] password for that web site. If you do not have the password, please call the hospital operator.  04/11/2022, 11:30 AM

## 2022-04-12 DIAGNOSIS — J850 Gangrene and necrosis of lung: Secondary | ICD-10-CM | POA: Diagnosis not present

## 2022-04-12 DIAGNOSIS — E876 Hypokalemia: Secondary | ICD-10-CM | POA: Diagnosis not present

## 2022-04-12 DIAGNOSIS — A419 Sepsis, unspecified organism: Secondary | ICD-10-CM | POA: Diagnosis not present

## 2022-04-12 DIAGNOSIS — F17210 Nicotine dependence, cigarettes, uncomplicated: Secondary | ICD-10-CM | POA: Diagnosis not present

## 2022-04-12 DIAGNOSIS — K219 Gastro-esophageal reflux disease without esophagitis: Secondary | ICD-10-CM | POA: Diagnosis not present

## 2022-04-12 DIAGNOSIS — R531 Weakness: Secondary | ICD-10-CM

## 2022-04-12 DIAGNOSIS — F209 Schizophrenia, unspecified: Secondary | ICD-10-CM | POA: Diagnosis not present

## 2022-04-12 DIAGNOSIS — R918 Other nonspecific abnormal finding of lung field: Secondary | ICD-10-CM | POA: Diagnosis not present

## 2022-04-12 LAB — CBC WITH DIFFERENTIAL/PLATELET
Abs Immature Granulocytes: 0.08 10*3/uL — ABNORMAL HIGH (ref 0.00–0.07)
Basophils Absolute: 0 10*3/uL (ref 0.0–0.1)
Basophils Relative: 0 %
Eosinophils Absolute: 0.2 10*3/uL (ref 0.0–0.5)
Eosinophils Relative: 2 %
HCT: 29.5 % — ABNORMAL LOW (ref 36.0–46.0)
Hemoglobin: 9.6 g/dL — ABNORMAL LOW (ref 12.0–15.0)
Immature Granulocytes: 1 %
Lymphocytes Relative: 7 %
Lymphs Abs: 0.6 10*3/uL — ABNORMAL LOW (ref 0.7–4.0)
MCH: 28.7 pg (ref 26.0–34.0)
MCHC: 32.5 g/dL (ref 30.0–36.0)
MCV: 88.1 fL (ref 80.0–100.0)
Monocytes Absolute: 0.6 10*3/uL (ref 0.1–1.0)
Monocytes Relative: 6 %
Neutro Abs: 7.5 10*3/uL (ref 1.7–7.7)
Neutrophils Relative %: 84 %
Platelets: 371 10*3/uL (ref 150–400)
RBC: 3.35 MIL/uL — ABNORMAL LOW (ref 3.87–5.11)
RDW: 13 % (ref 11.5–15.5)
WBC: 8.9 10*3/uL (ref 4.0–10.5)
nRBC: 0 % (ref 0.0–0.2)

## 2022-04-12 LAB — BASIC METABOLIC PANEL
Anion gap: 7 (ref 5–15)
BUN: 8 mg/dL (ref 8–23)
CO2: 25 mmol/L (ref 22–32)
Calcium: 8 mg/dL — ABNORMAL LOW (ref 8.9–10.3)
Chloride: 104 mmol/L (ref 98–111)
Creatinine, Ser: 0.7 mg/dL (ref 0.44–1.00)
GFR, Estimated: >60 mL/min (ref >60)
Glucose, Bld: 117 mg/dL — ABNORMAL HIGH (ref 70–99)
Potassium: 3.9 mmol/L (ref 3.5–5.1)
Sodium: 136 mmol/L (ref 135–145)

## 2022-04-12 MED ORDER — POLYETHYLENE GLYCOL 3350 17 G PO PACK: 17.0000 g | PACK | Freq: Every day | ORAL | 0 refills | Status: DC

## 2022-04-12 MED ORDER — OXYCODONE HCL 5 MG PO TABS: 5.0000 mg | ORAL_TABLET | Freq: Four times a day (QID) | ORAL | 0 refills | Status: DC | PRN

## 2022-04-12 MED ORDER — AMOXICILLIN-POT CLAVULANATE 875-125 MG PO TABS: 1.0000 | ORAL_TABLET | Freq: Two times a day (BID) | ORAL | 0 refills | Status: AC

## 2022-04-12 MED ORDER — SULFAMETHOXAZOLE-TRIMETHOPRIM 800-160 MG PO TABS: 1.0000 | ORAL_TABLET | Freq: Two times a day (BID) | ORAL | 0 refills | Status: AC

## 2022-04-12 NOTE — Progress Notes (Signed)
PULMONOLOGY         Date: 04/12/2022,   MRN# [id number] Selena Pittman Aug 24, 1950     AdmissionWeight: 55.3 kg                 CurrentWeight: 55.3 kg  Referring provider: Dr. Tobie Poet   CHIEF COMPLAINT:   Lung mass versus abscess   HISTORY OF PRESENT ILLNESS   This is a pleasant 71 year old female with a history of bipolar disorder, history of breast cancer, major depressive disorder, COPD with emphysema, dyslipidemia, osteoporosis, schizophrenia, thyroid nodules, vitamin D deficiency who came in due to undue fatigue and disequilibrium.  She apparently does have some confusion at baseline per family.  She did have a mechanical fall but denies having loss of consciousness or any injury.  She reports being too weak to be ambulatory and has been on bedrest for the last few days.  She does report having cough denies vomiting abdominal pain.  While in the ER she did have vitals done which were essentially normal except for tachypnea in the upper 20s.  She does have SpO2 of over 95% on room air.  She had urinalysis performed in the ER with a large amount of leukocyte Estrace and many bacteria consistent with possible UTI.  Her COVID-19 testing is negative.  CBC shows leukocytosis with a white count of 17,000, chronic anemia with a hemoglobin of 11 with a baseline around 15 and reactive thrombocytosis suggestive of ongoing bleed versus IDA.  CMP was performed with hypokalemia, hypoalbuminemia as well as transaminitis with increased AST. CT chest was performed which was reviewed by me independently with findings of right upper lobe anterior mass which is new from previous study which was abdominal CT scan performed in August with absence of this mass.  04/03/22-  patient seen and examined, remains on room air. Complains of abd pain but improved resp status. Discussed possible biopsy with patient. 04/06/22- patient reports minimal dyspnea.  She is on room air.  Her WBC count has improved  with Unasyn.  She reports ongoing abdominopelvic discomfort. She has abnormal UA suggestive of UTI with Klebsiella + urine culture that is resistant to multiple drugs. This may be cause of abd pain.  She has CT chest and abd done.  She has declined lung mass biopsy.  She seems to be responding to antibiotics so that may be acceptable to re-image later.  04/07/22- patient is stable, laying in bed comfortably.  She reports improvement in abdominal pain.  Very weak and deconditioned clinically.  Blood work essentially stable from prior with mild uptrend in wbc count. She remains on zyvox unasyn combination. Still refusing lung biopsy at this time. Will continue to follow.   04/08/22- Patient unchanged from prior. I spoke with POA son Selena Pittman , we reviewed imaging, labs , hospital course and findings. Reviewed plan for biopsy on hold until consent cleared. He will come in to get those signed today at [facility] out to IR for biopsy schedule.   04/09/22- s/p Lung biopsy. Mild decrement in O2 suspect atelectasis. Have ordered IS and asked RN to encourage patient to use it.   04/10/22  -patient is resting in bed on 2L/min [place].  She shares breathing is at baseline with no SOB.  She has pathology pending from lung biopsy. She complains of abdominal pain. WBC count has normalized on CBC.  She is stable from pulmonary perspective for outpatient evaluation.   04/12/22- patient has culture pathology from [facility] showing  abscess formation with gram Positive cocci.  Agree with antibiotics x 4 weeks appreciate ID input. Patient may be discharged with outpatient follow up.  PAST MEDICAL HISTORY   Past Medical History:  Diagnosis Date   Bipolar affective ([place])    Breast cancer ([place])    Cancer ([place]) 12-11-14   INVASIVE MAMMARY CARCINOMA/.left breast/ T2 N0   Cardiomegaly    Depression    Emphysema of lung ([place])    Endometriosis    Hyperlipidemia    Osteoporosis    Schizoaffective disorder ([place])    Schizophrenia  ([facility])    Thyroid nodule    Vitamin D deficiency      SURGICAL HISTORY   Past Surgical History:  Procedure Laterality Date   ABDOMINAL HYSTERECTOMY     BREAST BIOPSY Left 12-11-14   INVASIVE MAMMARY CARCINOMA.   BREAST LUMPECTOMY WITH SENTINEL LYMPH NODE BIOPSY Left 12/31/2014   Procedure: LEFT BREAST LUMPECTOMY WITH ULTRASOUND GUIDED NEEDLE LOCALIZATION, SENTINEL LYMPH NODE BIOPSY ;  Surgeon: Christene Lye, MD;  Location: [facility] ORS;  Service: General;  Laterality: Left;   DIAGNOSTIC MAMMOGRAM  12/04/2014   Done at [facility] Category 5-Left Breast   DILATION AND CURETTAGE OF UTERUS     MASTECTOMY Left 2016   SIMPLE MASTECTOMY WITH AXILLARY SENTINEL NODE BIOPSY Left 01/14/2015   Procedure: SIMPLE MASTECTOMY;  Surgeon: Christene Lye, MD;  Location: [facility] ORS;  Service: General;  Laterality: Left;   TENDON REPAIR Right    hand   TONSILLECTOMY     TUBAL LIGATION       FAMILY HISTORY   Family History  Problem Relation Age of Onset   Cancer Mother        uterine   Heart attack Father    Breast cancer Neg Hx      SOCIAL HISTORY   Social History   Tobacco Use   Smoking status: Every Day    Packs/day: 0.20    Years: 45.00    Total pack years: 9.00    Types: Cigarettes   Smokeless tobacco: Never  Vaping Use   Vaping Use: Never used  Substance Use Topics   Alcohol use: No    Alcohol/week: 0.0 standard drinks of alcohol   Drug use: No     MEDICATIONS    Home Medication:  Current Outpatient Rx   Order #: [id number] Class: Normal   Order #: [id number] Class: Normal   [START ON 04/13/2022] Order #: [id number] Class: Normal   Order #: [id number] Class: Normal     Current Medication:  Current Facility-Administered Medications:    0.9 %  sodium chloride infusion, , Intravenous, PRN, Pokhrel, Laxman, MD, Stopped at 04/08/22 1416   alum & mag hydroxide-simeth (MAALOX/MYLANTA) 200-200-20 MG/5ML suspension 30 mL, 30 mL, Oral, Q6H PRN **AND** lidocaine  (XYLOCAINE) 2 % viscous mouth solution 15 mL, 15 mL, Oral, Q6H PRN, Eugenie Filler, MD   amoxicillin-clavulanate (AUGMENTIN) 875-125 MG per tablet 1 tablet, 1 tablet, Oral, Q12H, Ravishankar, Jayashree, MD, 1 tablet at 04/12/22 0858   cholecalciferol (VITAMIN D3) 25 MCG (1000 UNIT) tablet 2,000 Units, 2,000 Units, Oral, Daily, Cox, Amy N, DO, 2,000 Units at 04/12/22 0858   cloZAPine (CLOZARIL) tablet 400 mg, 400 mg, Oral, QHS, Cox, Amy N, DO, 400 mg at 04/11/22 2116   docusate sodium (COLACE) capsule 100 mg, 100 mg, Oral, BID, Pokhrel, Laxman, MD, 100 mg at 04/12/22 0858   hydrOXYzine (ATARAX) tablet 25 mg, 25 mg, Oral, BID, Cox, Amy N, DO, 25 mg at 04/12/22  0900   letrozole [facility]) tablet 2.5 mg, 2.5 mg, Oral, Daily, Cox, Amy N, DO, 2.5 mg at 04/12/22 2376   linaclotide (LINZESS) capsule 145 mcg, 145 mcg, Oral, QAC breakfast, Cox, Amy N, DO, 145 mcg at 04/12/22 0859   montelukast (SINGULAIR) tablet 5 mg, 5 mg, Oral, QHS, Vira Blanco, RPH, 5 mg at 04/11/22 2117   mupirocin ointment (BACTROBAN) 2 %, , Nasal, BID, Pokhrel, Laxman, MD, Given at 04/12/22 0900   nicotine (NICODERM CQ - dosed in mg/24 hours) patch 21 mg, 21 mg, Transdermal, Daily PRN, Cox, Amy N, DO, 21 mg at 04/07/22 2831   oxyCODONE (Oxy IR/ROXICODONE) immediate release tablet 5 mg, 5 mg, Oral, Q6H PRN, Pokhrel, Laxman, MD, 5 mg at 04/12/22 0858   pantoprazole (PROTONIX) EC tablet 40 mg, 40 mg, Oral, Daily, Cox, Amy N, DO, 40 mg at 04/12/22 0858   polyethylene glycol (MIRALAX / GLYCOLAX) packet 17 g, 17 g, Oral, Daily, Pokhrel, Laxman, MD, 17 g at 04/12/22 0900   senna-docusate (Senokot-S) tablet 1 tablet, 1 tablet, Oral, QHS PRN, Cox, Amy N, DO   simvastatin (ZOCOR) tablet 20 mg, 20 mg, Oral, Daily, Cox, Amy N, DO, 20 mg at 04/12/22 0858   sulfamethoxazole-trimethoprim (BACTRIM DS) 800-160 MG per tablet 1 tablet, 1 tablet, Oral, Q12H, Ravishankar, Joellyn Quails, MD, 1 tablet at 04/12/22 0859    ALLERGIES   Cat hair extract,  Milk-related compounds, Mixed ragweed, and Peanut-containing drug products     REVIEW OF SYSTEMS    Review of Systems:  Gen:  Denies  fever, sweats, chills weigh loss  HEENT: Denies blurred vision, double vision, ear pain, eye pain, hearing loss, nose bleeds, sore throat Cardiac:  No dizziness, chest pain or heaviness, chest tightness,edema Resp:   reports dyspnea chronically  Gi: Denies swallowing difficulty, stomach pain, nausea or vomiting, diarrhea, constipation, bowel incontinence Gu:  Denies bladder incontinence, burning urine Ext:   Denies Joint pain, stiffness or swelling Skin: Denies  skin rash, easy bruising or bleeding or hives Endoc:  Denies polyuria, polydipsia , polyphagia or weight change Psych:   Denies depression, insomnia or hallucinations   Other:  All other systems negative   VS: BP 109/75 (BP Location: Left Arm)   Pulse 94   Temp 98.6 F (37 C) (Oral)   Resp 16   Ht '5\' 4"'$  (1.626 m)   Wt 55.3 kg   SpO2 96%   BMI 20.93 kg/m      PHYSICAL EXAM    GENERAL:NAD, no fevers, chills, no weakness no fatigue HEAD: Normocephalic, atraumatic.  EYES: Pupils equal, round, reactive to light. Extraocular muscles intact. No scleral icterus.  MOUTH: Moist mucosal membrane. Dentition intact. No abscess noted.  EAR, NOSE, THROAT: Clear without exudates. No external lesions.  NECK: Supple. No thyromegaly. No nodules. No JVD.  PULMONARY: decreased breath sounds with mild rhonchi worse at bases bilaterally.  CARDIOVASCULAR: S1 and S2. Regular rate and rhythm. No murmurs, rubs, or gallops. No edema. Pedal pulses 2+ bilaterally.  GASTROINTESTINAL: Soft, nontender, nondistended. No masses. Positive bowel sounds. No hepatosplenomegaly.  MUSCULOSKELETAL: No swelling, clubbing, or edema. Range of motion full in all extremities.  NEUROLOGIC: Cranial nerves II through XII are intact. No gross focal neurological deficits. Sensation intact. Reflexes intact.  SKIN: No ulceration,  lesions, rashes, or cyanosis. Skin warm and dry. Turgor intact.  PSYCHIATRIC: Mood, affect within normal limits. The patient is awake, alert and oriented x 3. Insight, judgment intact.       IMAGING  ASSESSMENT/PLAN   Large anterior right lung mass -Appears to be new since August  -Patient does have moderate pretest probability for lung cancer -Treating as abscess at this moment -Consultation infectious disease-input is appreciated. -Unable to produce respiratory culture -Procalcitonin trend is negative  -MRSA PCR -positive -Blood cultures x 2 have been ordered- NTD -Aspiratory viral panel- negative  -Aspergillus ab - negative -urine histoplasma antigen-negative -cryptococcal antigen-negative -Sputum cytology for possible malignancy -Likely will need PET scan on outpatient basis -Due to altered mental status may consider MRI brain to rule out metastatic lesions- CT head is reassuring -s/p lung biopsy via IR service 06/10/20  Thank you for allowing me to participate in the care of this patient.  Patient/Family are satisfied with care plan and all questions have been answered.    Provider disclosure: Patient with at least one acute or chronic illness or injury that poses a threat to life or bodily function and is being managed actively during this encounter.  All of the below services have been performed independently by signing provider:  review of prior documentation from internal and or external health records.  Review of previous and current lab results.  Interview and comprehensive assessment during patient visit today. Review of current and previous chest radiographs/CT scans. Discussion of management and test interpretation with health care team and patient/family.   This document was prepared using Dragon voice recognition software and may include unintentional dictation errors.     Ottie Glazier, M.D.  Division of Pulmonary & Critical Care Medicine

## 2022-04-12 NOTE — NC FL2 (Signed)
[place] LEVEL OF CARE FORM     IDENTIFICATION  Patient Name: Selena Pittman Birthdate: 1950-06-29 Sex: female Admission Date (Current Location): 04/02/2022  [place] and [place] Number:  Engineering geologist and Address:  [facility], [address], [place], [place] [address]      Provider Number: [id number]  Attending Physician Name and Address:  Eugenie Filler, MD  Relative Name and Phone Number:  [name] [phone]    Current Level of Care: Hospital Recommended Level of Care: [place] Prior Approval Number:    Date Approved/Denied:   PASRR Number: Pending  Discharge Plan: Other (Comment) (ALF)    Current Diagnoses: Patient Active Problem List   Diagnosis Date Noted   Generalized weakness 04/12/2022   Acute cystitis without hematuria 04/09/2022   Necrotizing pneumonia ([place]) 04/06/2022   Sepsis ([place]) 04/02/2022   Tobacco use 04/02/2022   GERD (gastroesophageal reflux disease) 04/02/2022   Schizophrenia ([place]) 04/02/2022   Leukocytosis 04/02/2022   Hypokalemia 04/02/2022   Mass of upper lobe of right lung 04/02/2022   Malignant neoplasm of left female breast ([place]) 06/05/2015    Orientation RESPIRATION BLADDER Height & Weight     Self, Situation, Place  Normal Incontinent Weight: 55.3 kg Height:  '5\' 4"'$  (162.6 cm)  BEHAVIORAL SYMPTOMS/MOOD NEUROLOGICAL BOWEL NUTRITION STATUS      Incontinent Diet  AMBULATORY STATUS COMMUNICATION OF NEEDS Skin   Limited Assist Verbally Skin abrasions                       Personal Care Assistance Level of Assistance  Dressing, Bathing Bathing Assistance: Limited assistance Feeding assistance: Limited assistance Dressing Assistance: Limited assistance Total Care Assistance: Limited assistance   Functional Limitations Info  Sight, Hearing, Speech Sight Info: Adequate Hearing Info: Adequate Speech Info: Adequate    SPECIAL CARE FACTORS FREQUENCY   PT (By licensed PT), OT (By licensed OT)     PT Frequency: min 4x weekly OT Frequency: min 4x weekly            Contractures Contractures Info: Not present    Additional Factors Info  Code Status, Allergies Code Status Info: Full Allergies Info: Cat Hair Extract   Not Specified  12/11/2021  Past Updates  Milk-related Compounds   Not Specified  12/11/2021  Past Updates  Mixed Ragweed   Not Specified  12/11/2021  Past Updates  Peanut-containing Drug Products            Discharge Medications:     Medication List       STOP taking these medications     calcium carbonate 1500 (600 Ca) MG Tabs tablet Commonly known as: OSCAL    hydrOXYzine 25 MG tablet Commonly known as: ATARAX    mometasone 0.1 % lotion Commonly known as: ELOCON    nystatin-triamcinolone cream Commonly known as: MYCOLOG II           TAKE these medications     alendronate 70 MG tablet Commonly known as: FOSAMAX Take 70 mg by mouth once a week. Take with a full glass of water on an empty stomach.    amoxicillin-clavulanate 875-125 MG tablet Commonly known as: AUGMENTIN Take 1 tablet by mouth every 12 (twelve) hours for 26 days.    cetirizine 10 MG tablet Commonly known as: ZYRTEC Take 10 mg by mouth daily.    cholecalciferol 1000 units tablet Commonly known as: VITAMIN D Take 2,000 Units by mouth daily.  cloZAPine 100 MG tablet Commonly known as: CLOZARIL Take 400 mg by mouth daily.    coal tar-salicylic acid 2 % shampoo Use to wash scalp Tuesday, Thursday and Saturdays.    docusate sodium 100 MG capsule Commonly known as: COLACE Take 100 mg by mouth 2 (two) times daily.    hydrOXYzine 25 MG capsule Commonly known as: VISTARIL Take 25 mg by mouth 2 (two) times daily.    ketoconazole 2 % shampoo Commonly known as: NIZORAL APPLY TO AFFECTED AREA TWICE A WEEK FOR 8 WEEKS; THEN USE AS NEEDED. What changed: Another medication with the same name was removed. Continue taking this  medication, and follow the directions you see here.    letrozole 2.5 MG tablet Commonly known as: FEMARA Take 1 tablet (2.5 mg total) by mouth daily.    Linzess 145 MCG Caps capsule Generic drug: linaclotide Take 145 mcg by mouth daily. What changed: Another medication with the same name was removed. Continue taking this medication, and follow the directions you see here.    montelukast 5 MG chewable tablet Commonly known as: SINGULAIR Chew 5 mg by mouth at bedtime.    omeprazole 20 MG capsule Commonly known as: PRILOSEC Take 20 mg by mouth daily.    oxyCODONE 5 MG immediate release tablet Commonly known as: Oxy IR/ROXICODONE Take 1 tablet (5 mg total) by mouth every 6 (six) hours as needed for moderate pain or severe pain.    polyethylene glycol 17 g packet Commonly known as: MIRALAX / GLYCOLAX Take 17 g by mouth daily. Start taking on: April 13, 2022    simvastatin 20 MG tablet Commonly known as: ZOCOR Take 20 mg by mouth daily.    sulfamethoxazole-trimethoprim 800-160 MG tablet Commonly known as: BACTRIM DS Take 1 tablet by mouth every 12 (twelve) hours for 26 days.    Relevant Imaging Results:  Relevant Lab Results:   Additional Information [id number]  Valente David, RN

## 2022-04-12 NOTE — Progress Notes (Signed)
PULMONOLOGY         Date: 04/12/2022,   MRN# [id number] Selena Pittman 09-17-1950     AdmissionWeight: 55.3 kg                 CurrentWeight: 55.3 kg  Referring provider: Dr. Tobie Poet   CHIEF COMPLAINT:   Lung mass versus abscess   HISTORY OF PRESENT ILLNESS   This is a pleasant 71 year old female with a history of bipolar disorder, history of breast cancer, major depressive disorder, COPD with emphysema, dyslipidemia, osteoporosis, schizophrenia, thyroid nodules, vitamin D deficiency who came in due to undue fatigue and disequilibrium.  She apparently does have some confusion at baseline per family.  She did have a mechanical fall but denies having loss of consciousness or any injury.  She reports being too weak to be ambulatory and has been on bedrest for the last few days.  She does report having cough denies vomiting abdominal pain.  While in the ER she did have vitals done which were essentially normal except for tachypnea in the upper 20s.  She does have SpO2 of over 95% on room air.  She had urinalysis performed in the ER with a large amount of leukocyte Estrace and many bacteria consistent with possible UTI.  Her COVID-19 testing is negative.  CBC shows leukocytosis with a white count of 17,000, chronic anemia with a hemoglobin of 11 with a baseline around 15 and reactive thrombocytosis suggestive of ongoing bleed versus IDA.  CMP was performed with hypokalemia, hypoalbuminemia as well as transaminitis with increased AST. CT chest was performed which was reviewed by me independently with findings of right upper lobe anterior mass which is new from previous study which was abdominal CT scan performed in August with absence of this mass.  04/03/22-  patient seen and examined, remains on room air. Complains of abd pain but improved resp status. Discussed possible biopsy with patient. 04/06/22- patient reports minimal dyspnea.  She is on room air.  Her WBC count has improved  with Unasyn.  She reports ongoing abdominopelvic discomfort. She has abnormal UA suggestive of UTI with Klebsiella + urine culture that is resistant to multiple drugs. This may be cause of abd pain.  She has CT chest and abd done.  She has declined lung mass biopsy.  She seems to be responding to antibiotics so that may be acceptable to re-image later.  04/07/22- patient is stable, laying in bed comfortably.  She reports improvement in abdominal pain.  Very weak and deconditioned clinically.  Blood work essentially stable from prior with mild uptrend in wbc count. She remains on zyvox unasyn combination. Still refusing lung biopsy at this time. Will continue to follow.   04/08/22- Patient unchanged from prior. I spoke with POA son [name] , we reviewed imaging, labs , hospital course and findings. Reviewed plan for biopsy on hold until consent cleared. He will come in to get those signed today at [place] out to IR for biopsy schedule.   04/09/22- s/p Lung biopsy. Mild decrement in O2 suspect atelectasis. Have ordered IS and asked RN to encourage patient to use it.   04/10/22  -patient is resting in bed on 2L/min [place].  She shares breathing is at baseline with no SOB.  She has pathology pending from lung biopsy. She complains of abdominal pain. WBC count has normalized on CBC.  She is stable from pulmonary perspective for outpatient evaluation.   04/12/23 - patient seen at bedside , no  new complaints.  She is stable from pulmonary perspective.   PAST MEDICAL HISTORY   Past Medical History:  Diagnosis Date   Bipolar affective ([place])    Breast cancer ([place])    Cancer ([place]) 12-11-14   INVASIVE MAMMARY CARCINOMA/.left breast/ T2 N0   Cardiomegaly    Depression    Emphysema of lung ([place])    Endometriosis    Hyperlipidemia    Osteoporosis    Schizoaffective disorder ([place])    Schizophrenia ([facility])    Thyroid nodule    Vitamin D deficiency      SURGICAL HISTORY   Past Surgical History:   Procedure Laterality Date   ABDOMINAL HYSTERECTOMY     BREAST BIOPSY Left 12-11-14   INVASIVE MAMMARY CARCINOMA.   BREAST LUMPECTOMY WITH SENTINEL LYMPH NODE BIOPSY Left 12/31/2014   Procedure: LEFT BREAST LUMPECTOMY WITH ULTRASOUND GUIDED NEEDLE LOCALIZATION, SENTINEL LYMPH NODE BIOPSY ;  Surgeon: Christene Lye, MD;  Location: [facility] ORS;  Service: General;  Laterality: Left;   DIAGNOSTIC MAMMOGRAM  12/04/2014   Done at [place] Category 5-Left Breast   DILATION AND CURETTAGE OF UTERUS     MASTECTOMY Left 2016   SIMPLE MASTECTOMY WITH AXILLARY SENTINEL NODE BIOPSY Left 01/14/2015   Procedure: SIMPLE MASTECTOMY;  Surgeon: Christene Lye, MD;  Location: [facility] ORS;  Service: General;  Laterality: Left;   TENDON REPAIR Right    hand   TONSILLECTOMY     TUBAL LIGATION       FAMILY HISTORY   Family History  Problem Relation Age of Onset   Cancer Mother        uterine   Heart attack Father    Breast cancer Neg Hx      SOCIAL HISTORY   Social History   Tobacco Use   Smoking status: Every Day    Packs/day: 0.20    Years: 45.00    Total pack years: 9.00    Types: Cigarettes   Smokeless tobacco: Never  Vaping Use   Vaping Use: Never used  Substance Use Topics   Alcohol use: No    Alcohol/week: 0.0 standard drinks of alcohol   Drug use: No     MEDICATIONS    Home Medication:  Current Outpatient Rx   Order #: [id number] Class: Normal   Order #: [id number] Class: Normal   [START ON 04/13/2022] Order #: [id number] Class: Normal   Order #: [id number] Class: Normal     Current Medication:  Current Facility-Administered Medications:    0.9 %  sodium chloride infusion, , Intravenous, PRN, Pokhrel, Laxman, MD, Stopped at 04/08/22 1416   alum & mag hydroxide-simeth (MAALOX/MYLANTA) 200-200-20 MG/5ML suspension 30 mL, 30 mL, Oral, Q6H PRN **AND** lidocaine (XYLOCAINE) 2 % viscous mouth solution 15 mL, 15 mL, Oral, Q6H PRN, Eugenie Filler, MD    amoxicillin-clavulanate (AUGMENTIN) 875-125 MG per tablet 1 tablet, 1 tablet, Oral, Q12H, Ravishankar, Jayashree, MD, 1 tablet at 04/12/22 0858   cholecalciferol (VITAMIN D3) 25 MCG (1000 UNIT) tablet 2,000 Units, 2,000 Units, Oral, Daily, Cox, Amy N, DO, 2,000 Units at 04/12/22 0858   cloZAPine (CLOZARIL) tablet 400 mg, 400 mg, Oral, QHS, Cox, Amy N, DO, 400 mg at 04/11/22 2116   docusate sodium (COLACE) capsule 100 mg, 100 mg, Oral, BID, Pokhrel, Laxman, MD, 100 mg at 04/12/22 0858   hydrOXYzine (ATARAX) tablet 25 mg, 25 mg, Oral, BID, Cox, Amy N, DO, 25 mg at 04/12/22 0900   letrozole [facility]) tablet 2.5 mg, 2.5 mg, Oral, Daily, Cox, Amy  N, DO, 2.5 mg at 04/12/22 3151   linaclotide (LINZESS) capsule 145 mcg, 145 mcg, Oral, QAC breakfast, Cox, Amy N, DO, 145 mcg at 04/12/22 0859   montelukast (SINGULAIR) tablet 5 mg, 5 mg, Oral, QHS, Vira Blanco, RPH, 5 mg at 04/11/22 2117   mupirocin ointment (BACTROBAN) 2 %, , Nasal, BID, Pokhrel, Laxman, MD, Given at 04/12/22 0900   nicotine (NICODERM CQ - dosed in mg/24 hours) patch 21 mg, 21 mg, Transdermal, Daily PRN, Cox, Amy N, DO, 21 mg at 04/07/22 7616   oxyCODONE (Oxy IR/ROXICODONE) immediate release tablet 5 mg, 5 mg, Oral, Q6H PRN, Pokhrel, Laxman, MD, 5 mg at 04/12/22 0858   pantoprazole (PROTONIX) EC tablet 40 mg, 40 mg, Oral, Daily, Cox, Amy N, DO, 40 mg at 04/12/22 0858   polyethylene glycol (MIRALAX / GLYCOLAX) packet 17 g, 17 g, Oral, Daily, Pokhrel, Laxman, MD, 17 g at 04/12/22 0900   senna-docusate (Senokot-S) tablet 1 tablet, 1 tablet, Oral, QHS PRN, Cox, Amy N, DO   simvastatin (ZOCOR) tablet 20 mg, 20 mg, Oral, Daily, Cox, Amy N, DO, 20 mg at 04/12/22 0858   sulfamethoxazole-trimethoprim (BACTRIM DS) 800-160 MG per tablet 1 tablet, 1 tablet, Oral, Q12H, Ravishankar, Joellyn Quails, MD, 1 tablet at 04/12/22 0859    ALLERGIES   Cat hair extract, Milk-related compounds, Mixed ragweed, and Peanut-containing drug products     REVIEW OF  SYSTEMS    Review of Systems:  Gen:  Denies  fever, sweats, chills weigh loss  HEENT: Denies blurred vision, double vision, ear pain, eye pain, hearing loss, nose bleeds, sore throat Cardiac:  No dizziness, chest pain or heaviness, chest tightness,edema Resp:   reports dyspnea chronically  Gi: Denies swallowing difficulty, stomach pain, nausea or vomiting, diarrhea, constipation, bowel incontinence Gu:  Denies bladder incontinence, burning urine Ext:   Denies Joint pain, stiffness or swelling Skin: Denies  skin rash, easy bruising or bleeding or hives Endoc:  Denies polyuria, polydipsia , polyphagia or weight change Psych:   Denies depression, insomnia or hallucinations   Other:  All other systems negative   VS: BP 109/75 (BP Location: Left Arm)   Pulse 94   Temp 98.6 F (37 C) (Oral)   Resp 16   Ht '5\' 4"'$  (1.626 m)   Wt 55.3 kg   SpO2 96%   BMI 20.93 kg/m      PHYSICAL EXAM    GENERAL:NAD, no fevers, chills, no weakness no fatigue HEAD: Normocephalic, atraumatic.  EYES: Pupils equal, round, reactive to light. Extraocular muscles intact. No scleral icterus.  MOUTH: Moist mucosal membrane. Dentition intact. No abscess noted.  EAR, NOSE, THROAT: Clear without exudates. No external lesions.  NECK: Supple. No thyromegaly. No nodules. No JVD.  PULMONARY: decreased breath sounds with mild rhonchi worse at bases bilaterally.  CARDIOVASCULAR: S1 and S2. Regular rate and rhythm. No murmurs, rubs, or gallops. No edema. Pedal pulses 2+ bilaterally.  GASTROINTESTINAL: Soft, nontender, nondistended. No masses. Positive bowel sounds. No hepatosplenomegaly.  MUSCULOSKELETAL: No swelling, clubbing, or edema. Range of motion full in all extremities.  NEUROLOGIC: Cranial nerves II through XII are intact. No gross focal neurological deficits. Sensation intact. Reflexes intact.  SKIN: No ulceration, lesions, rashes, or cyanosis. Skin warm and dry. Turgor intact.  PSYCHIATRIC: Mood, affect  within normal limits. The patient is awake, alert and oriented x 3. Insight, judgment intact.       IMAGING     ASSESSMENT/PLAN   Large anterior right lung mass -Appears to be  new since August  -Patient does have moderate pretest probability for lung cancer -Treating as abscess at this moment -Consultation infectious disease-input is appreciated. -Unable to produce respiratory culture -Procalcitonin trend is negative  -MRSA PCR -positive -Blood cultures x 2 have been ordered- NTD -Aspiratory viral panel- negative  -Aspergillus ab - negative -urine histoplasma antigen-negative -cryptococcal antigen-negative -Sputum cytology for possible malignancy -Likely will need PET scan on outpatient basis -Due to altered mental status may consider MRI brain to rule out metastatic lesions- CT head is reassuring -s/p lung biopsy via IR service 06/10/20  Thank you for allowing me to participate in the care of this patient.  Patient/Family are satisfied with care plan and all questions have been answered.    Provider disclosure: Patient with at least one acute or chronic illness or injury that poses a threat to life or bodily function and is being managed actively during this encounter.  All of the below services have been performed independently by signing provider:  review of prior documentation from internal and or external health records.  Review of previous and current lab results.  Interview and comprehensive assessment during patient visit today. Review of current and previous chest radiographs/CT scans. Discussion of management and test interpretation with health care team and patient/family.   This document was prepared using Dragon voice recognition software and may include unintentional dictation errors.     Ottie Glazier, M.D.  Division of Pulmonary & Critical Care Medicine

## 2022-04-12 NOTE — Discharge Summary (Signed)
Physician Discharge Summary  Selena Pittman IWP:[id number] DOB: 1950/05/30 DOA: 04/02/2022  PCP: Remi Haggard, FNP  Admit date: 04/02/2022 Discharge date: 04/12/2022  Time spent: 60 minutes  Recommendations for Outpatient Follow-up:  Follow-up with Remi Haggard, FNP in 1 week.  On follow-up patient need a comprehensive metabolic profile, CBC done to follow-up on electrolytes renal function and counts.  Patient will need weekly c-Met and CBC with results faxed to Dr. Ramon Dredge at [phone]. Follow-up with Dr. Ramon Dredge, ID on 05/05/2022 10:15 AM. Follow-up with Dr. Lanney Gins,, pulmonary in 2 weeks. Follow-up with MD at facility.  Patient will need weekly c-Met, CBC done and results faxed to Dr.Ravishanker at [phone].   Discharge Diagnoses:  Principal Problem:   Mass of upper lobe of right lung Active Problems:   Malignant neoplasm of left female breast (HCC)   Sepsis ([place])   Tobacco use   GERD (gastroesophageal reflux disease)   Schizophrenia (HCC)   Leukocytosis   Hypokalemia   Necrotizing pneumonia (HCC)   Acute cystitis without hematuria   Generalized weakness   Discharge Condition: Stable and improved.  Diet recommendation: Regular  Filed Weights   04/02/22 1245  Weight: 55.3 kg    History of present illness:  HPI per Dr. Tobie Poet  Selena Pittman is a 71 year old female with history of bipolar, hyperlipidemia, schizophrenia, who presents emergency department for chief concerns of generalized weakness and new onset immobility.   Initial vitals in the emergency department showed temperature of 98.5, respiration rate 20, heart rate of 99, blood pressure 115/79, SpO2 of 97% on room air.   Serum sodium is 139, potassium 2.4, chloride 104, bicarb 27, BUN of 17, serum creatinine of 0.80.  Nonfasting blood glucose is 108.  eGFR greater than 60.   WBC was 17, hemoglobin 11.3, platelets of 473.   COVID/influenza A/influenza B/RSV PCR were  negative.   Magnesium level was 2.1 on admission.   ED treatment: LR 1 L bolus, magnesium 2 g IV, potassium chloride 10 mill equivalent x 4, Augmentin 3 g IV.   At bedside patient was able to tell me her name, her age, she knows she is in the hospital.  Patient has a flat affect.   She reports she is in the hospital because she was feeling sick.  She endorses abdominal pain.  She denies cough, shortness of breath, chest pain.   She endorses loss of appetite.  She endorses chronic diarrhea.  She does not know how long she has lost her appetite.  She reports no changes to her diarrhea.  Hospital Course:  #1 pulmonary parenchymal mass/concerning for necrotic pneumonia -Noted on chest x-ray, follow-up CT with concern for right sided lesion with central necrosis. -At this time unable to rule out pulmonary abscess/atypical infection. -Respiratory viral panel negative. -Serum aspergillosis negative. -MRSA PCR positive from nares. -COVID-19 PCR negative, influenza AMB negative. -Procalcitonin negative -Blood cultures with no growth to date. -Cryptococcal antigen negative. --Patient seen in consultation by pulmonary and ID. -Patient maintained on broad-spectrum antibiotics of Unasyn and linezolid. -Pulmonary following and recommending outpatient PET scan. -IR consulted for CT-guided lung mass biopsy which was done, 04/09/2022 with Surgical pathology with reactive fibrous stroma and focal skeletal muscle with acute inflammation, abscess formation, necroinflammatory debris.  Gram stain highlights GPC and necroinflammatory debris.  AFB and GMS stains are negative.  Negative for granulomas, vasculitis or malignancy. -ID, pulmonary, following and antibiotics changed to Bactrim DS twice daily as well as Augmentin 875  mg twice daily through 05/08/2022 per ID recommendations with outpatient follow-up with ID scheduled for 05/05/2022.   -Patient also follow-up with pulmonary in the outpatient setting.     2.  Sepsis -Patient met criteria for sepsis on admission with tachypnea, tachycardia, leukocytosis of 17,000, CT chest/chest x-ray concerning for possible pulmonary abscess. -Blood cultures with no growth to date. -Urinalysis with 60,000 colonies of Klebsiella pneumonia which is sensitive to ampicillin sulbactam which patient is currently on.. -Currently afebrile. -Leukocytosis trended down and was 8.9 by day of discharge from  8.6 from 9.3 from 10.6 from 11.3 from 17.0 on admission. -Patient was placed on empiric IV antibiotics of Unasyn, linezolid during the hospitalization. -Patient underwent CT-guided lung biopsy, 04/09/2022 per IR with studies pending.  -Surgical pathology with reactive fibrous stroma and focal skeletal muscle with acute inflammation, abscess formation, necroinflammatory debris.  Gram stain highlights GPC and necroinflammatory debris.  AFB and GMS stains are negative.  Negative for granulomas, vasculitis or malignancy. -ID following and recommended on discharge to change patient to Bactrim DS twice daily and to change Unasyn to Augmentin 875 twice daily through 05/08/2022 with outpatient follow-up with ID which has been set for 05/05/2022 10:15 AM. -ID recommending weekly CMP, CBC labs done and faxed to Dr.Ravishanker at [phone]. -Patient was discharged in stable and improved condition. -Outpatient follow-up with ID.   3.  Klebsiella UTI -Urine cultures with 60,000 colonies of Klebsiella sensitive to ampicillin sulbactam. -Patient was on IV Unasyn throughout the hospitalization and adequately treated for UTI.    4.  GERD -Patient maintained on PPI.   5.  Hypokalemia -Repleted. -Potassium noted at 3.9 by day of discharge.     6.  Nonspecific abdominal pain/constipation -CT abdomen and pelvis with no acute findings but significant stool in the colon. -Patient placed on MiraLAX twice daily as well as Colace twice daily.   -Outpatient follow-up.    7.   Schizophrenia -Patient maintained on home regimen Clozapine.    8.  Tobacco use -Tobacco cessation. -Nicotine patch ordered as needed..   9.  History of malignant neoplasm left breast -Patient maintained on home regimen letrozole.     Procedures: CT chest 04/02/2022 CT head CT C-spine 04/02/2022 CT abdomen and pelvis 04/03/2022 Chest x-ray 04/02/2022 CT right lung biopsy per IR, Dr.El-Abd 04/09/2022  Consultations: IR: Dr. Earleen Newport 04/08/2022 Pulmonary: Dr. Lanney Gins 04/02/2022 Infectious disease: Dr.Ravishanker 04/03/2022  Discharge Exam: Vitals:   04/12/22 0438 04/12/22 0746  BP: 112/70 109/75  Pulse: 100 94  Resp: 20 16  Temp: 98.6 F (37 C) 98.6 F (37 C)  SpO2: 95% 96%    General: NAD.  Sleeping. Cardiovascular: Regular rate and rhythm no murmurs rubs or gallops.  No JVD.  No lower extremity edema. Respiratory: Clear to auscultation bilaterally.  No wheezes, no crackles, no rhonchi.  Fair air movement.  Speaking in full sentences.  Discharge Instructions   Discharge Instructions     Diet general   Complete by: As directed    Increase activity slowly   Complete by: As directed    No wound care   Complete by: As directed       Allergies as of 04/12/2022       Reactions   Cat Hair Extract    Milk-related Compounds    Mixed Ragweed    Peanut-containing Drug Products         Medication List     STOP taking these medications    calcium carbonate 1500 (  600 Ca) MG Tabs tablet Commonly known as: OSCAL   hydrOXYzine 25 MG tablet Commonly known as: ATARAX   mometasone 0.1 % lotion Commonly known as: ELOCON   nystatin-triamcinolone cream Commonly known as: MYCOLOG II       TAKE these medications    alendronate 70 MG tablet Commonly known as: FOSAMAX Take 70 mg by mouth once a week. Take with a full glass of water on an empty stomach.   amoxicillin-clavulanate 875-125 MG tablet Commonly known as: AUGMENTIN Take 1 tablet by mouth every  12 (twelve) hours for 26 days.   cetirizine 10 MG tablet Commonly known as: ZYRTEC Take 10 mg by mouth daily.   cholecalciferol 1000 units tablet Commonly known as: VITAMIN D Take 2,000 Units by mouth daily.   cloZAPine 100 MG tablet Commonly known as: CLOZARIL Take 400 mg by mouth daily.   coal tar-salicylic acid 2 % shampoo Use to wash scalp Tuesday, Thursday and Saturdays.   docusate sodium 100 MG capsule Commonly known as: COLACE Take 100 mg by mouth 2 (two) times daily.   hydrOXYzine 25 MG capsule Commonly known as: VISTARIL Take 25 mg by mouth 2 (two) times daily.   ketoconazole 2 % shampoo Commonly known as: NIZORAL APPLY TO AFFECTED AREA TWICE A WEEK FOR 8 WEEKS; THEN USE AS NEEDED. What changed: Another medication with the same name was removed. Continue taking this medication, and follow the directions you see here.   letrozole 2.5 MG tablet Commonly known as: FEMARA Take 1 tablet (2.5 mg total) by mouth daily.   Linzess 145 MCG Caps capsule Generic drug: linaclotide Take 145 mcg by mouth daily. What changed: Another medication with the same name was removed. Continue taking this medication, and follow the directions you see here.   montelukast 5 MG chewable tablet Commonly known as: SINGULAIR Chew 5 mg by mouth at bedtime.   omeprazole 20 MG capsule Commonly known as: PRILOSEC Take 20 mg by mouth daily.   oxyCODONE 5 MG immediate release tablet Commonly known as: Oxy IR/ROXICODONE Take 1 tablet (5 mg total) by mouth every 6 (six) hours as needed for moderate pain or severe pain.   polyethylene glycol 17 g packet Commonly known as: MIRALAX / GLYCOLAX Take 17 g by mouth daily. Start taking on: April 13, 2022   simvastatin 20 MG tablet Commonly known as: ZOCOR Take 20 mg by mouth daily.   sulfamethoxazole-trimethoprim 800-160 MG tablet Commonly known as: BACTRIM DS Take 1 tablet by mouth every 12 (twelve) hours for 26 days.       Allergies   Allergen Reactions   Cat Hair Extract    Milk-Related Compounds    Mixed Ragweed    Peanut-Containing Drug Products     Follow-up Information     Remi Haggard, FNP. Schedule an appointment as soon as possible for a visit in 1 week(s).   Specialty: Family Medicine Why: Will need weekly CMET and CBC with results faxed to Dr.Ravishanker at [phone]. Contact information: [place] [address] [phone]         Tsosie Billing, MD Follow up on 05/05/2022.   Specialty: Infectious Diseases Why: Follow-up at 10:15 AM. Contact information: [place] [address] [phone]         Ottie Glazier, MD. Schedule an appointment as soon as possible for a visit in 2 week(s).   Specialty: Pulmonary Disease Contact information: [place] [address] [phone]  MD at facility Follow up.                   The results of significant diagnostics from this hospitalization (including imaging, microbiology, ancillary and laboratory) are listed below for reference.    Significant Diagnostic Studies: CT LUNG MASS BIOPSY  Result Date: 04/09/2022 INDICATION: Right lung mass EXAM: CT-guided core needle biopsy of right lung mass MEDICATIONS: None. ANESTHESIA/SEDATION: Moderate (conscious) sedation was employed during this procedure. A total of Versed 0.5 mg and Fentanyl 25 mcg was administered intravenously. Moderate Sedation Time: 16 minutes. The patient's level of consciousness and vital signs were monitored continuously by radiology nursing throughout the procedure under my direct supervision. FLUOROSCOPY TIME:  N/a COMPLICATIONS: None immediate. PROCEDURE: Informed written consent was obtained from the patient after a thorough discussion of the procedural risks, benefits and alternatives. All questions were addressed. Maximal Sterile Barrier Technique was utilized including caps, mask, sterile  gowns, sterile gloves, sterile drape, hand hygiene and skin antiseptic. A timeout was performed prior to the initiation of the procedure. The patient was placed supine on the exam table. Limited CT of the chest was performed for planning purposes. This demonstrated right lung mass. The inferior portion of the mass was targeted for biopsy as this seemed less centrally necrotic. Skin entry site was marked, and the overlying skin was prepped and draped in the standard sterile fashion. Local analgesia was obtained with 1% lidocaine. Using intermittent CT fluoroscopy, a 17 gauge introducer needle was advanced towards the identified lesion. Subsequently, core needle biopsy was performed using an 18 gauge core biopsy device times multiple passes. Specimens were submitted in formalin and saline for pathology and microbiology analysis, respectively. Limited postprocedure imaging demonstrated no complicating feature. The patient tolerated the procedure well, and was transferred to recovery in stable condition. IMPRESSION: Successful CT-guided core needle biopsy of right lung mass. Specimens sent for both pathology and microbiology analysis. Electronically Signed   By: Albin Felling M.D.   On: 04/09/2022 13:21   DG Chest Port 1 View  Result Date: 04/09/2022 CLINICAL DATA:  RIGHT-sided lung mass, cough, post lung biopsy EXAM: PORTABLE CHEST 1 VIEW COMPARISON:  CT chest 04/02/2022 FINDINGS: Normal heart size, mediastinal contours, and pulmonary vascularity. Large RIGHT upper lobe mass again identified 8.7 x 9.1 cm. Remaining lungs clear. No acute infiltrate, pleural effusion, or pneumothorax. IMPRESSION: No pneumothorax following lung biopsy. Large RIGHT upper lobe mass. Electronically Signed   By: Lavonia [name] M.D.   On: 04/09/2022 10:04   CT ABDOMEN PELVIS W CONTRAST  Result Date: 04/03/2022 CLINICAL DATA:  Abdominal pain, acute, nonlocalized. EXAM: CT ABDOMEN AND PELVIS WITH CONTRAST TECHNIQUE: Multidetector CT  imaging of the abdomen and pelvis was performed using the standard protocol following bolus administration of intravenous contrast. RADIATION DOSE REDUCTION: This exam was performed according to the departmental dose-optimization program which includes automated exposure control, adjustment of the mA and/or kV according to patient size and/or use of iterative reconstruction technique. CONTRAST:  28m OMNIPAQUE IOHEXOL 300 MG/ML  SOLN COMPARISON:  CT examination dated December 11, 2021 FINDINGS: Lower chest: Bibasilar atelectasis. Hepatobiliary: No focal liver abnormality is seen. No gallstones, gallbladder wall thickening, or biliary dilatation. Pancreas: Unremarkable. No pancreatic ductal dilatation or surrounding inflammatory changes. Spleen: Normal in size without focal abnormality. Adrenals/Urinary Tract: Adrenal glands are unremarkable. Kidneys are normal, without renal calculi, focal lesion, or hydronephrosis. Bladder is unremarkable. Stomach/Bowel: Stomach is within normal limits. Small bowel loops are normal in caliber. Appendix not identified.  There is moderate amount of stool in the descending and rectosigmoid colon. There is gaseous distention of the transverse colon. No bowel wall thickening or inflammatory changes. Vascular/Lymphatic: Moderate aortic atherosclerosis. No enlarged abdominal or pelvic lymph nodes. Reproductive: Status post hysterectomy. No adnexal masses. Other: No abdominal wall hernia or abnormality. No abdominopelvic ascites. Musculoskeletal: Multilevel degenerate disc disease with disc height loss and marginal osteophytes. Grade 1 anterolisthesis of L4, unchanged. Levoscoliosis of the lumbar spine. IMPRESSION: 1. Moderate amount of stool in the descending and rectosigmoid colon with gaseous distention of the transverse colon. No evidence of bowel obstruction or inflammatory changes. 2. Moderate aortic atherosclerosis. 3. Multilevel degenerate disc disease with disc height loss and  marginal osteophytes. Grade 1 anterolisthesis of L4, unchanged. Levoscoliosis of the lumbar spine. Electronically Signed   By: Keane Police D.O.   On: 04/03/2022 14:46   CT Chest W Contrast  Result Date: 04/02/2022 CLINICAL DATA:  Abnormal x-ray. Right chest mass. * Tracking Code: BO * EXAM: CT CHEST WITH CONTRAST TECHNIQUE: Multidetector CT imaging of the chest was performed during intravenous contrast administration. RADIATION DOSE REDUCTION: This exam was performed according to the departmental dose-optimization program which includes automated exposure control, adjustment of the mA and/or kV according to patient size and/or use of iterative reconstruction technique. CONTRAST:  23m OMNIPAQUE IOHEXOL 300 MG/ML  SOLN COMPARISON:  Chest radiographs 04/02/2022 and 12/03/2021. Abdominal CT 12/11/2021. FINDINGS: Cardiovascular: No acute vascular findings. Diffuse coronary artery atherosclerosis with lesser involvement of the aorta and great vessels. The heart size is normal. There is a small amount of pericardial fluid. Mediastinum/Nodes: There are no enlarged mediastinal, hilar or axillary lymph nodes. The thyroid gland, trachea and esophagus demonstrate no significant findings. Lungs/Pleura: No pleural effusion or pneumothorax. As seen on earlier radiographs, there is a well-circumscribed mass anteromedially in the right hemithorax which measures approximately 7.7 x 5.4 x 8.2 cm. This abuts the anterior chest wall and anterior mediastinum and has components within both the upper and middle lobes. This mass is heterogeneous with central low-density suspicious for necrosis. There is a peripheral irregular thick wall. There is no air within this collection. Minimal surrounding airspace disease which may reflect atelectasis. There is additional mild dependent atelectasis in both lung bases. Mild underlying centrilobular and paraseptal emphysema. No other suspicious pulmonary nodules. Upper abdomen: No significant  findings are demonstrated within the visualized upper abdomen. Musculoskeletal/Chest wall: There is no chest wall mass or suspicious osseous finding. No chest wall involvement by the mass is demonstrated. Thoracolumbar spondylosis noted. IMPRESSION: 1. Well-circumscribed heterogeneous mass anteromedially in the right hemithorax with central low-density suspicious for necrosis. By imaging, this is highly suspicious for malignancy. However, this mass is not apparent on the portable chest x-ray or abdominal CT done less than 4 months ago such that pulmonary abscess or other atypical infection should be considered in this patient presenting with a cough. Management options include short-term CT follow-up after appropriate antibiotic therapy and tissue sampling to assess for infection and malignancy. 2. No evidence of metastatic disease. 3. No pleural effusion or pneumothorax. 4. Diffuse coronary artery atherosclerosis. Aortic Atherosclerosis (ICD10-I70.0) and Emphysema (ICD10-J43.9). Electronically Signed   By: WRichardean SaleM.D.   On: 04/02/2022 14:31   CT Head Wo Contrast  Result Date: 04/02/2022 CLINICAL DATA:  Head trauma, minor (Age >= 65y); Neck trauma (Age >= 65y). Generalized weakness EXAM: CT HEAD WITHOUT CONTRAST CT CERVICAL SPINE WITHOUT CONTRAST TECHNIQUE: Multidetector CT imaging of the head and cervical spine was performed following  the standard protocol without intravenous contrast. Multiplanar CT image reconstructions of the cervical spine were also generated. RADIATION DOSE REDUCTION: This exam was performed according to the departmental dose-optimization program which includes automated exposure control, adjustment of the mA and/or kV according to patient size and/or use of iterative reconstruction technique. COMPARISON:  03/28/2022 FINDINGS: CT HEAD FINDINGS Brain: No evidence of acute infarction, hemorrhage, hydrocephalus, extra-axial collection or mass lesion/mass effect. Moderate low-density  changes within the periventricular and subcortical white matter compatible with chronic microvascular ischemic change. Mild diffuse cerebral volume loss. Vascular: No hyperdense vessel or unexpected calcification. Skull: Normal. Negative for fracture or focal lesion. Sinuses/Orbits: No acute finding. Other: Negative for scalp hematoma. CT CERVICAL SPINE FINDINGS Alignment: Facet joints are aligned without dislocation or traumatic listhesis. Dens and lateral masses are aligned. Unchanged reversal of the cervical lordosis. Skull base and vertebrae: No acute fracture. No primary bone lesion or focal pathologic process. Soft tissues and spinal canal: No prevertebral fluid or swelling. No visible canal hematoma. Disc levels: Advanced multilevel degenerative disc disease, unchanged. Upper chest: Included lung apices are clear. Other: None. IMPRESSION: 1. No acute intracranial abnormality. 2. No acute fracture or subluxation of the cervical spine. 3. Chronic microvascular ischemic change and cerebral volume loss. 4. 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 Electronically Signed   By: Davina Poke D.O.   On: 04/02/2022 13:29   CT Cervical Spine Wo Contrast  Result Date: 04/02/2022 CLINICAL DATA:  Head trauma, minor (Age >= 65y); Neck trauma (Age >= 65y). Generalized weakness EXAM: CT HEAD WITHOUT CONTRAST CT CERVICAL SPINE WITHOUT CONTRAST TECHNIQUE: Multidetector CT imaging of the head and cervical spine was performed following the standard protocol without intravenous contrast. Multiplanar CT image reconstructions of the cervical spine were also generated. RADIATION DOSE REDUCTION: This exam was performed according to the departmental dose-optimization program which includes automated exposure control, adjustment of the mA and/or kV according to patient size and/or use of iterative reconstruction technique. COMPARISON:  03/28/2022 FINDINGS: CT HEAD FINDINGS Brain: No  evidence of acute infarction, hemorrhage, hydrocephalus, extra-axial collection or mass lesion/mass effect. Moderate low-density changes within the periventricular and subcortical white matter compatible with chronic microvascular ischemic change. Mild diffuse cerebral volume loss. Vascular: No hyperdense vessel or unexpected calcification. Skull: Normal. Negative for fracture or focal lesion. Sinuses/Orbits: No acute finding. Other: Negative for scalp hematoma. CT CERVICAL SPINE FINDINGS Alignment: Facet joints are aligned without dislocation or traumatic listhesis. Dens and lateral masses are aligned. Unchanged reversal of the cervical lordosis. Skull base and vertebrae: No acute fracture. No primary bone lesion or focal pathologic process. Soft tissues and spinal canal: No prevertebral fluid or swelling. No visible canal hematoma. Disc levels: Advanced multilevel degenerative disc disease, unchanged. Upper chest: Included lung apices are clear. Other: None. IMPRESSION: 1. No acute intracranial abnormality. 2. No acute fracture or subluxation of the cervical spine. 3. Chronic microvascular ischemic change and cerebral volume loss. 4. 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 Electronically Signed   By: Davina Poke D.O.   On: 04/02/2022 13:29   DG Chest 2 View  Result Date: 04/02/2022 CLINICAL DATA:  Weakness and cough. EXAM: CHEST - 2 VIEW COMPARISON:  CXR 12/03/21 FINDINGS: There is a large mass projecting over the right perihilar region that is new compared to 12/03/2021. The heart size is normal. No pleural effusion. No pneumothorax. No displaced rib fractures. Visualized upper abdomen is unremarkable. IMPRESSION: Large, possibly pleural based, mass projecting over  the right perihilar region. Recommend further evaluation with chest CT with contrast. This is new compared to 12/03/21. Electronically Signed   By: Marin Roberts M.D.   On: 04/02/2022 13:29   CT  Cervical Spine Wo Contrast  Result Date: 03/28/2022 CLINICAL DATA:  71 year old female status post fall. Right facial laceration. EXAM: CT CERVICAL SPINE WITHOUT CONTRAST TECHNIQUE: Multidetector CT imaging of the cervical spine was performed without intravenous contrast. Multiplanar CT image reconstructions were also generated. RADIATION DOSE REDUCTION: This exam was performed according to the departmental dose-optimization program which includes automated exposure control, adjustment of the mA and/or kV according to patient size and/or use of iterative reconstruction technique. COMPARISON:  Head CT today.  Cervical spine CT 07/21/2018. FINDINGS: Alignment: Stable since 2020. Mild chronic reversal of upper cervical lordosis. Cervicothoracic junction alignment is within normal limits. Bilateral posterior element alignment is within normal limits. Skull base and vertebrae: Visualized skull base is intact. No atlanto-occipital dissociation. Congenital incomplete ossification of the posterior C1 ring, normal variant. C1 and C2 appear intact and aligned. Bulky chronic cervical endplate osteophytosis. No acute osseous abnormality identified. Soft tissues and spinal canal: No prevertebral fluid or swelling. No visible canal hematoma. Mild for age cervical carotid calcified atherosclerosis. Mild motion artifact, otherwise negative visible noncontrast neck. 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 Multiple levels of vacuum disc. Mild cervical spinal stenosis is probable C3-C4 through C5-C6. Upper chest: Negative. IMPRESSION: 1. No acute traumatic injury identified in the cervical spine. 2. 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 Mild cervical spinal stenosis suspected C3-C4 through C5-C6. Electronically Signed   By: Genevie Ann M.D.   On: 03/28/2022 08:06   CT HEAD WO CONTRAST (5MM)  Result Date: 03/28/2022 CLINICAL DATA:  71 year old  female status post fall. Right facial laceration. EXAM: CT HEAD WITHOUT CONTRAST TECHNIQUE: Contiguous axial images were obtained from the base of the skull through the vertex without intravenous contrast. RADIATION DOSE REDUCTION: This exam was performed according to the departmental dose-optimization program which includes automated exposure control, adjustment of the mA and/or kV according to patient size and/or use of iterative reconstruction technique. COMPARISON:  Head CT 07/21/2018. FINDINGS: Brain: Mild cerebral volume loss since 2020 appears to be generalized. No midline shift, mass effect, or evidence of intracranial mass lesion. No ventriculomegaly. No acute intracranial hemorrhage identified. Bilateral Patchy and confluent cerebral white matter hypodensity has not significantly changed, moderate for age. Deep white matter capsules, deep gray nuclei relatively spared. No acute or chronic cortical infarct identified. Vascular: Calcified atherosclerosis at the skull base. No suspicious intracranial vascular hyperdensity. Skull: Stable. No fracture identified. 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 Sinuses/Orbits: Visualized paranasal sinuses and mastoids are stable and well aerated. Other: No orbit or scalp soft tissue injury identified. IMPRESSION: 1. No acute traumatic injury identified. 2. 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 Electronically Signed   By: Genevie Ann M.D.   On: 03/28/2022 04:05    Microbiology: Recent Results (from the past 240 hour(s))  Resp panel by RT-PCR (RSV, Flu A&B, Covid) Anterior Nasal Swab     Status: None   Collection Time: 04/02/22 12:56 PM   Specimen: Anterior Nasal Swab  Result Value Ref Range Status   SARS Coronavirus 2 by RT PCR NEGATIVE NEGATIVE Final    Comment: (NOTE) SARS-CoV-2 target nucleic acids are NOT DETECTED.  The SARS-CoV-2 RNA is generally detectable in upper respiratory specimens  during the acute phase of infection. The lowest concentration of SARS-CoV-2 viral copies this assay can detect is 138 copies/mL. A negative result does not preclude SARS-Cov-2 infection and should not be used as the sole basis for treatment or other patient management decisions. A negative result may occur with  improper specimen collection/handling, submission of specimen other than nasopharyngeal swab, presence of viral mutation(s) within the areas targeted by this assay, and inadequate number of viral copies(<138 copies/mL). A negative result must be combined with clinical observations, patient history, and epidemiological information. The expected result is Negative.  Fact Sheet for Patients:  EntrepreneurPulse.com.au  Fact Sheet for Healthcare Providers:  IncredibleEmployment.be  This test is no t yet approved or cleared by the [place] FDA and  has been authorized for detection and/or diagnosis of SARS-CoV-2 by FDA under an Emergency Use Authorization (EUA). This EUA will remain  in effect (meaning this test can be used) for the duration of the COVID-19 declaration under Section 564(b)(1) of the Act, 21 U.S.C.section 360bbb-3(b)(1), unless the authorization is terminated  or revoked sooner.       Influenza A by PCR NEGATIVE NEGATIVE Final   Influenza B by PCR NEGATIVE NEGATIVE Final    Comment: (NOTE) The Xpert Xpress SARS-CoV-2/FLU/RSV plus assay is intended as an aid in the diagnosis of influenza from Nasopharyngeal swab specimens and should not be used as a sole basis for treatment. Nasal washings and aspirates are unacceptable for Xpert Xpress SARS-CoV-2/FLU/RSV testing.  Fact Sheet for Patients: EntrepreneurPulse.com.au  Fact Sheet for Healthcare Providers: IncredibleEmployment.be  This test is not yet approved or cleared by the [place] FDA and has been authorized for detection  and/or diagnosis of SARS-CoV-2 by FDA under an Emergency Use Authorization (EUA). This EUA will remain in effect (meaning this test can be used) for the duration of the COVID-19 declaration under Section 564(b)(1) of the Act, 21 U.S.C. section 360bbb-3(b)(1), unless the authorization is terminated or revoked.     Resp Syncytial Virus by PCR NEGATIVE NEGATIVE Final    Comment: (NOTE) Fact Sheet for Patients: EntrepreneurPulse.com.au  Fact Sheet for Healthcare Providers: IncredibleEmployment.be  This test is not yet approved or cleared by the [place] FDA and has been authorized for detection and/or diagnosis of SARS-CoV-2 by FDA under an Emergency Use Authorization (EUA). This EUA will remain in effect (meaning this test can be used) for the duration of the COVID-19 declaration under Section 564(b)(1) of the Act, 21 U.S.C. section 360bbb-3(b)(1), unless the authorization is terminated or revoked.  Performed at [facility], [address] [place], [place] [address]   Culture, blood (Routine X 2) w Reflex to ID Panel     Status: None   Collection Time: 04/02/22  3:50 PM   Specimen: BLOOD  Result Value Ref Range Status   Specimen Description BLOOD BLOOD LEFT ARM  Final   Special Requests   Final    BOTTLES DRAWN AEROBIC AND ANAEROBIC Blood Culture results may not be optimal due to an inadequate volume of blood received in culture bottles   Culture   Final    NO GROWTH 5 DAYS Performed at [facility], [address] [place], [place] [address]    Report Status 04/07/2022 FINAL  Final  Respiratory (~20 pathogens) panel by PCR     Status: None   Collection Time: 04/02/22  3:50 PM   Specimen: Respiratory  Result Value Ref Range Status   Adenovirus NOT DETECTED NOT DETECTED Final  Coronavirus 229E NOT DETECTED NOT DETECTED Final    Comment: (NOTE) The Coronavirus on the Respiratory Panel, DOES NOT test for the novel   Coronavirus (2019 nCoV)    Coronavirus HKU1 NOT DETECTED NOT DETECTED Final   Coronavirus NL63 NOT DETECTED NOT DETECTED Final   Coronavirus OC43 NOT DETECTED NOT DETECTED Final   Metapneumovirus NOT DETECTED NOT DETECTED Final   Rhinovirus / Enterovirus NOT DETECTED NOT DETECTED Final   Influenza A NOT DETECTED NOT DETECTED Final   Influenza B NOT DETECTED NOT DETECTED Final   Parainfluenza Virus 1 NOT DETECTED NOT DETECTED Final   Parainfluenza Virus 2 NOT DETECTED NOT DETECTED Final   Parainfluenza Virus 3 NOT DETECTED NOT DETECTED Final   Parainfluenza Virus 4 NOT DETECTED NOT DETECTED Final   Respiratory Syncytial Virus NOT DETECTED NOT DETECTED Final   Bordetella pertussis NOT DETECTED NOT DETECTED Final   Bordetella Parapertussis NOT DETECTED NOT DETECTED Final   Chlamydophila pneumoniae NOT DETECTED NOT DETECTED Final   Mycoplasma pneumoniae NOT DETECTED NOT DETECTED Final    Comment: Performed at [facility], [place] [address] [place], [place] [address]  Culture, blood (Routine X 2) w Reflex to ID Panel     Status: None   Collection Time: 04/02/22  4:52 PM   Specimen: BLOOD  Result Value Ref Range Status   Specimen Description BLOOD BLOOD RIGHT ARM  Final   Special Requests   Final    BOTTLES DRAWN AEROBIC AND ANAEROBIC Blood Culture adequate volume   Culture   Final    NO GROWTH 5 DAYS Performed at [facility], [address] [place], [place] [address]    Report Status 04/07/2022 FINAL  Final  MRSA Next Gen by PCR, Nasal     Status: Abnormal   Collection Time: 04/02/22  5:50 PM  Result Value Ref Range Status   MRSA by PCR Next Gen DETECTED (A) NOT DETECTED Final    Comment: RESULT CALLED TO, READ BACK BY AND VERIFIED WITH: Butler Denmark _0  on 03/03/22 skl (NOTE) The GeneXpert MRSA Assay (FDA approved for NASAL specimens only), is one component of a comprehensive MRSA colonization surveillance program. It is not intended to diagnose MRSA  infection nor to guide or monitor treatment for MRSA infections. Test performance is not FDA approved in patients less than 52 years old. Performed at [facility], [address] [place], [place] [address]   Aspergillus Ag, BAL/Serum     Status: None   Collection Time: 04/03/22  4:46 AM   Specimen: Vein  Result Value Ref Range Status   Aspergillus Ag, BAL/Serum 0.04 0.00 - 0.49 Index Final    Comment: (NOTE) Performed At: [facility] [address] [place], [place] [id number] Rush Farmer MD VV:[id number]   Urine Culture     Status: Abnormal   Collection Time: 04/03/22  5:18 PM   Specimen: Urine, Random  Result Value Ref Range Status   Specimen Description   Final    URINE, RANDOM Performed at [facility], [address] [place],  [address]    Special Requests   Final    NONE Performed at [facility], [place],  [address]    Culture 60,000 COLONIES/mL KLEBSIELLA PNEUMONIAE (A)  Final   Report Status 04/06/2022 FINAL  Final   Organism ID, Bacteria KLEBSIELLA PNEUMONIAE (A)  Final      Susceptibility   Klebsiella pneumoniae - MIC*    AMPICILLIN RESISTANT Resistant  CEFAZOLIN <=4 SENSITIVE Sensitive     CEFEPIME <=0.12 SENSITIVE Sensitive     CEFTRIAXONE <=0.25 SENSITIVE Sensitive     CIPROFLOXACIN <=0.25 SENSITIVE Sensitive     GENTAMICIN <=1 SENSITIVE Sensitive     IMIPENEM <=0.25 SENSITIVE Sensitive     NITROFURANTOIN <=16 SENSITIVE Sensitive     TRIMETH/SULFA <=20 SENSITIVE Sensitive     AMPICILLIN/SULBACTAM 4 SENSITIVE Sensitive     PIP/TAZO <=4 SENSITIVE Sensitive     * 60,000 COLONIES/mL KLEBSIELLA PNEUMONIAE     Labs: Basic Metabolic Panel: Recent Labs  Lab 04/06/22 0258 04/07/22 0437 04/08/22 0605 04/09/22 0510 04/10/22 0643 04/11/22 0540 04/12/22 0639  NA 139 138 136 138 139 137 136  K 3.7 4.1 3.9 3.8 3.8 3.8 3.9  CL 105 104 106 104 105 104 104  CO2 _0 GLUCOSE 111* 127* 119* 125* 113* 112* 117*  BUN _1 CREATININE 0.62 0.59 0.60 0.61 0.63 0.55 0.70  CALCIUM 8.0* 8.2* 8.1* 8.0* 8.1* 7.9* 8.0*  MG 1.9 2.0 2.2 2.1  --   --   --    Liver Function Tests: No results for input(s): "AST", "ALT", "ALKPHOS", "BILITOT", "PROT", "ALBUMIN" in the last 168 hours. No results for input(s): "LIPASE", "AMYLASE" in the last 168 hours. No results for input(s): "AMMONIA" in the last 168 hours. CBC: Recent Labs  Lab 04/07/22 0437 04/08/22 0605 04/09/22 0510 04/10/22 0643 04/11/22 0540 04/12/22 0639  WBC 12.1* 11.3* 10.6* 9.3 8.6 8.9  NEUTROABS 10.5*  --  8.9* 7.7 7.3 7.5  HGB 10.3* 10.5* 10.1* 9.8* 9.6* 9.6*  HCT 32.0* 32.5* 31.1* 30.6* 29.6* 29.5*  MCV 89.9 90.0 88.6 88.4 88.4 88.1  PLT 424* 409* 403* 384 371 371   Cardiac Enzymes: No results for input(s): "CKTOTAL", "CKMB", "CKMBINDEX", "TROPONINI" in the last 168 hours. BNP: BNP (last 3 results) No results for input(s): "BNP" in the last 8760 hours.  ProBNP (last 3 results) No results for input(s): "PROBNP" in the last 8760 hours.  CBG: No results for input(s): "GLUCAP" in the last 168 hours.     Signed:  Irine Seal MD.  [facility] 04/12/2022, 11:37 AM

## 2022-04-12 NOTE — Progress Notes (Signed)
   Date of Admission:  04/02/2022     ID: Lenita Peregrina is a 71 y.o. female Principal Problem:   Mass of upper lobe of right lung Active Problems:   Malignant neoplasm of left female breast (HCC)   Sepsis ([place])   Tobacco use   GERD (gastroesophageal reflux disease)   Schizophrenia (HCC)   Leukocytosis   Hypokalemia   Necrotizing pneumonia (HCC)   Acute cystitis without hematuria   Generalized weakness    Subjective: Pt says she is sick But comfortable in bed with no distress  Medications:   amoxicillin-clavulanate  1 tablet Oral Q12H   cholecalciferol  2,000 Units Oral Daily   cloZAPine  400 mg Oral QHS   docusate sodium  100 mg Oral BID   hydrOXYzine  25 mg Oral BID   letrozole  2.5 mg Oral Daily   linaclotide  145 mcg Oral QAC breakfast   montelukast  5 mg Oral QHS   mupirocin ointment   Nasal BID   pantoprazole  40 mg Oral Daily   polyethylene glycol  17 g Oral Daily   simvastatin  20 mg Oral Daily   sulfamethoxazole-trimethoprim  1 tablet Oral Q12H    Objective: Vital signs in last 24 hours: Temp:  [97.6 F (36.4 C)-98.8 F (37.1 C)] 98.6 F (37 C) (12/24 0746) Pulse Rate:  [88-100] 94 (12/24 0746) Resp:  [16-20] 16 (12/24 0746) BP: (101-112)/(64-75) 109/75 (12/24 0746) SpO2:  [95 %-98 %] 96 % (12/24 0746)    PHYSICAL EXAM:  General: Alert, cooperative, no distress,  Scalp seborrheic keratosis Lungs: b/l air entry- crepts rt side Heart: Regular rate and rhythm, no murmur, rub or gallop. Abdomen: Soft, non-tender,not distended. Bowel sounds normal. No masses Extremities: atraumatic, no cyanosis. No edema. No clubbing Skin: No rashes or lesions. Or bruising Lymph: Cervical, supraclavicular normal. Neurologic: Grossly non-focal  Lab Results Recent Labs    04/11/22 0540 04/12/22 0639  WBC 8.6 8.9  HGB 9.6* 9.6*  HCT 29.6* 29.5*  NA 137 136  K 3.8 3.9  CL 104 104  CO2 24 25  BUN 10 8  CREATININE 0.55 0.70    Studies/Results:  Rt  upper lobe mass with central necrosis  Pathology of lung biopsy REACTIVE FIBROUS STROMA AND FOCAL SKELETAL MUSCLE WITH ACUTE  INFLAMMATION, ABSCESS FORMATION, AND NECROINFLAMMATORY DEBRIS.  - GRAM STAIN HIGHLIGHTS GRAM POSITIVE COCCI IN NECROINFLAMMATORY DEBRIS.  - AFB AND GMS STAINS ARE NEGATIVE.  - NEGATIVE FOR GRANULOMAS, VASCULITIS, AND MALIGNANCY  Assessment/Plan: 71 y.o. female with a history of Bipolar disorder, breast ca s.p simple mastectomy left , now on letrazole, presents to the ED on 04/02/22 with weakness, pain abdome? ? Necrotic mass rt lung - this was not present in Aug CXR She has bad dentition R/o necrotizing pneumonia- r/o bacterial VS actinomyces/fungal R/o malignancy Lung biopsy  done on 04/09/22 - pathology no malignancy Necrotic debris- gran positive cocci As cultures could not be done will have to send for NGS MRSA nares positive  Pt currently on lbacterim and po augmentin after getting 1 week of linezolid and IV unasyn Will need PO antibiotics for 4 weeks While on v=bactrim need to check weekly BMP to make sure no hyperkalemia and no AKI  Anemia Poor nutrition     H/o ca breast left mastectomy. On letrazole   Bipolar/schizoaffective - on clozapine?  Discussed the management with the care team

## 2022-04-12 NOTE — TOC Progression Note (Addendum)
Transition of Care [facility]) - Progression Note    Patient Details  Name: Josanna Hefel MRN: [id number] Date of Birth: 1950/09/11  Transition [facility] CM/SW Contact  Valente David, RN Phone Number: 04/12/2022, 10:06 AM  Clinical Narrative:     Notified by MD that patient may be ready for discharge today.  Call placed to Devin Going to discuss plan.  She is able to pick patient up today for discharge back to [facility] but she is not available until after 2pm.  She is requesting an updated FL2 with discharge medications.  If any medications are changed or added, she will need a 2-3 day supply as her local pharmacy will not open again until Tuesday.  TOC will continue to collaborate for discharge planning.   Update 1115: Per MD, patient will be discharged on new medications.  [facility] pharmacy unable to provide medications but [facility] on [address] will be open today until 6pm and tomorrow until 2pm. Ms. Lanae Boast confirms she will be abl to pick up discharge medications from [facility].  Patient also in need of rolling walker, DME requested from [facility] with [employer].   Expected Discharge Plan:  ([place]) Barriers to Discharge: Continued Medical Work up  Expected Discharge Plan and [place] arrangements for the past 2 months: [place] [facility])                                       Social Determinants of Health (SDOH) Interventions SDOH Screenings   Tobacco Use: High Risk (04/03/2022)    Readmission Risk Interventions     No data to display

## 2022-04-23 ENCOUNTER — Other Ambulatory Visit: Payer: Self-pay | Admitting: Radiology

## 2022-04-23 ENCOUNTER — Institutional Professional Consult (permissible substitution): Payer: Medicare Other | Admitting: Pulmonary Disease

## 2022-04-23 ENCOUNTER — Ambulatory Visit (INDEPENDENT_AMBULATORY_CARE_PROVIDER_SITE_OTHER): Payer: Medicare Other | Admitting: Student in an Organized Health Care Education/Training Program

## 2022-04-23 ENCOUNTER — Encounter: Payer: Self-pay | Admitting: Student in an Organized Health Care Education/Training Program

## 2022-04-23 VITALS — BP 120/70 | HR 123 | Temp 97.9°F | Ht 64.0 in | Wt 113.0 lb

## 2022-04-23 DIAGNOSIS — J850 Gangrene and necrosis of lung: Secondary | ICD-10-CM | POA: Diagnosis not present

## 2022-04-23 NOTE — Progress Notes (Signed)
Synopsis: Referred in for necrotic pneumonia by Remi Haggard, FNP  Assessment & Plan:   1. Necrotizing pneumonia ([place])  Patient is presenting for follow up regarding her RUL necrotic infiltrate seen on CT scan of the chest. She underwent a CT guided biopsy of the lesion with pathology overall consistent with an abscess (necro-inflammatory debris, abscess, positive gram stain within the necro-inflammatory debris). She is  currently on a prolonged course of antibiotics for said abscess and will be following up with ID on 05/05/2022.  It is unclear whether there is an underlying malignancy (endobronchial versus parenchymal) that resulted in a post obstructive process. The patient would benefit from repeat imaging after the conclusion of her treatment to document resolution of the infection and work her up for any underlying malignancy. Should that be the case, will consider PET/CT and/or bronchoscopy for further workup.  - CT CHEST W CONTRAST; Future (05/28/2022) - Continue antibiotics and follow up with ID   Return in about 6 weeks (around 06/04/2022).  I spent 60 minutes caring for this patient today, including preparing to see the patient, obtaining a medical history , reviewing a separately obtained history, performing a medically appropriate examination and/or evaluation, counseling and educating the patient/family/caregiver, ordering medications, tests, or procedures, documenting clinical information in the electronic health record, and independently interpreting results (not separately reported/billed) and communicating results to the patient/family/caregiver  Armando Reichert, MD [facility] 04/23/2022 12:13 PM    End of visit medications:  No orders of the defined types were placed in this encounter.    Current Outpatient Medications:    alendronate (FOSAMAX) 70 MG tablet, Take 70 mg by mouth once a week. Take with a full glass of water on an empty stomach.,  Disp: , Rfl:    amoxicillin-clavulanate (AUGMENTIN) 875-125 MG tablet, Take 1 tablet by mouth every 12 (twelve) hours for 26 days., Disp: 52 tablet, Rfl: 0   cetirizine (ZYRTEC) 10 MG tablet, Take 10 mg by mouth daily., Disp: , Rfl:    cholecalciferol (VITAMIN D) 1000 UNITS tablet, Take 2,000 Units by mouth daily., Disp: , Rfl:    cloZAPine (CLOZARIL) 100 MG tablet, Take 400 mg by mouth daily., Disp: , Rfl:    coal tar-salicylic acid 2 % shampoo, Use to wash scalp Tuesday, Thursday and Saturdays., Disp: 120 mL, Rfl: 5   docusate sodium (COLACE) 100 MG capsule, Take 100 mg by mouth 2 (two) times daily., Disp: , Rfl:    hydrOXYzine (VISTARIL) 25 MG capsule, Take 25 mg by mouth 2 (two) times daily., Disp: , Rfl:    ketoconazole (NIZORAL) 2 % shampoo, APPLY TO AFFECTED AREA TWICE A WEEK FOR 8 WEEKS; THEN USE AS NEEDED., Disp: 120 mL, Rfl: 1   letrozole (FEMARA) 2.5 MG tablet, Take 1 tablet (2.5 mg total) by mouth daily., Disp: 90 tablet, Rfl: 1   LINZESS 145 MCG CAPS capsule, Take 145 mcg by mouth daily., Disp: , Rfl:    montelukast (SINGULAIR) 5 MG chewable tablet, Chew 5 mg by mouth at bedtime., Disp: , Rfl:    omeprazole (PRILOSEC) 20 MG capsule, Take 20 mg by mouth daily., Disp: , Rfl:    oxyCODONE (OXY IR/ROXICODONE) 5 MG immediate release tablet, Take 1 tablet (5 mg total) by mouth every 6 (six) hours as needed for moderate pain or severe pain., Disp: 10 tablet, Rfl: 0   polyethylene glycol (MIRALAX / GLYCOLAX) 17 g packet, Take 17 g by mouth daily., Disp: 14 each, Rfl: 0  simvastatin (ZOCOR) 20 MG tablet, Take 20 mg by mouth daily., Disp: , Rfl:    sulfamethoxazole-trimethoprim (BACTRIM DS) 800-160 MG tablet, Take 1 tablet by mouth every 12 (twelve) hours for 26 days., Disp: 52 tablet, Rfl: 0   Subjective:   PATIENT ID: Selena Pittman GENDER: female DOB: 10-Jun-1950, MRN: [id number]  Chief Complaint  Patient presents with   Hospitalization Follow-up    D/C 04/12/2022-breathing is doing  well. No current sx.     HPI  Selena Pittman is a pleasant 72 year old female presenting to clinic for the evaluation of a RUL necrotic pneumonia.  The patient has a history of schizophrenia and is a resident of a nursing facility. She had suffered a fall on 03/28/2022 requiring a visit to the ED. She re-presented to the ED on 04/02/2022 secondary to weakness, and was found to have a RUL infiltrate requiring admission. She was admitted 04/02/2022-04/12/2022 for further workup and management. She underwent a CT scan of the chest on 04/02/2022 notable for a heterogenous mass in the right hemithorax with central necrosis. She underwent an infectious workup (overall negative for a specific organism) and was started on broad spectrum antibiotics. She underwent a CT guided core needle biopsy on 04/09/2022. Pathology was notable for reactive fibrous stroma, focal skeletal muscle, with acute inflammation and abscess formation. Gram stain was positive for gram positive cocci in the necro-inflammatory debris. The patient was discharged on 04/12/2022 to finish a course of antibiotics (Augmentin and Bactrim). She has a follow up appointment with Dr. Delaine Lame from infectious diseases on 05/05/2022. She was unable to see Dr. Lanney Gins from [facility] pulmonology who had seen her in the hospital due to insurance reasons and was scheduled to see me in clinic.  Today, she presents in the presence of her caregiver. She currently resides at the nursing facility due to her history of schizophrenia. She reports no respiratory symptoms. She does not have a cough, sputum production, or hemoptysis. She denies any chest pain, chest tightness, wheezing, or shortness of breath. She denies any night sweats or weight loss. The patient and her caregiver deny any difficulty swallowing or episodes of aspiration. It is unclear if there was an episode of over sedation or loss of consciousness prior to the presentation and the patient is unable to  elaborate further.  Ancillary information including prior medications, full medical/surgical/family/social histories, and PFTs (when available) are listed below and have been reviewed.  Review of Systems  Constitutional:  Negative for chills, fever, malaise/fatigue and weight loss.  Respiratory:  Negative for cough, hemoptysis, sputum production, shortness of breath and wheezing.   Cardiovascular:  Negative for chest pain, leg swelling and PND.     Objective:   Vitals:   04/23/22 1011  BP: 120/70  Pulse: (!) 123  Temp: 97.9 F (36.6 C)  SpO2: 100%  Weight: 113 lb (51.3 kg)  Height: '5\' 4"'$  (1.626 m)   100% on RA  BMI Readings from Last 3 Encounters:  04/23/22 19.40 kg/m  04/02/22 20.93 kg/m  03/28/22 20.93 kg/m   Wt Readings from Last 3 Encounters:  04/23/22 113 lb (51.3 kg)  04/02/22 121 lb 14.6 oz (55.3 kg)  03/28/22 121 lb 14.6 oz (55.3 kg)    Physical Exam Constitutional:      Appearance: She is not ill-appearing.  HENT:     Head: Normocephalic.     Nose: Nose normal.     Mouth/Throat:     Mouth: Mucous membranes are moist.  Cardiovascular:  Rate and Rhythm: Normal rate and regular rhythm.  Pulmonary:     Effort: Pulmonary effort is normal.     Breath sounds: No wheezing, rhonchi or rales.     Comments: Decreased breath sounds anteriorly over the right upper lung field Abdominal:     Palpations: Abdomen is soft.  Musculoskeletal:        General: No swelling.     Cervical back: Normal range of motion.     Right lower leg: No edema.     Left lower leg: No edema.  Skin:    General: Skin is warm.  Neurological:     General: No focal deficit present.     Mental Status: She is alert.       Ancillary Information    Past Medical History:  Diagnosis Date   Bipolar affective ([place])    Breast cancer ([place])    Cancer ([place]) 12-11-14   INVASIVE MAMMARY CARCINOMA/.left breast/ T2 N0   Cardiomegaly    Depression    Emphysema of lung ([place])     Endometriosis    Hyperlipidemia    Osteoporosis    Schizoaffective disorder ([facility])    Schizophrenia ([facility])    Thyroid nodule    Vitamin D deficiency      Family History  Problem Relation Age of Onset   Cancer Mother        uterine   Heart attack Father    Breast cancer Neg Hx      Past Surgical History:  Procedure Laterality Date   ABDOMINAL HYSTERECTOMY     BREAST BIOPSY Left 12-11-14   INVASIVE MAMMARY CARCINOMA.   BREAST LUMPECTOMY WITH SENTINEL LYMPH NODE BIOPSY Left 12/31/2014   Procedure: LEFT BREAST LUMPECTOMY WITH ULTRASOUND GUIDED NEEDLE LOCALIZATION, SENTINEL LYMPH NODE BIOPSY ;  Surgeon: Christene Lye, MD;  Location: [facility] ORS;  Service: General;  Laterality: Left;   DIAGNOSTIC MAMMOGRAM  12/04/2014   Done at [place] Category 5-Left Breast   DILATION AND CURETTAGE OF UTERUS     MASTECTOMY Left 2016   SIMPLE MASTECTOMY WITH AXILLARY SENTINEL NODE BIOPSY Left 01/14/2015   Procedure: SIMPLE MASTECTOMY;  Surgeon: Christene Lye, MD;  Location: [facility] ORS;  Service: General;  Laterality: Left;   TENDON REPAIR Right    hand   TONSILLECTOMY     TUBAL LIGATION      Social History   Socioeconomic History   Marital status: Divorced    Spouse name: Not on file   Number of children: Not on file   Years of education: Not on file   Highest education level: Not on file  Occupational History   Not on file  Tobacco Use   Smoking status: Every Day    Packs/day: 1.00    Years: 45.00    Total pack years: 45.00    Types: Cigarettes   Smokeless tobacco: Never   Tobacco comments:    3 cig daily- 04/23/2022  Vaping Use   Vaping Use: Never used  Substance and Sexual Activity   Alcohol use: No    Alcohol/week: 0.0 standard drinks of alcohol   Drug use: No   Sexual activity: Not Currently  Other Topics Concern   Not on file  Social History Narrative   Not on file   Social Determinants of Health   Financial Resource Strain: Not on file  Food Insecurity:  Not on file  Transportation Needs: Not on file  Physical Activity: Not on file  Stress: Not on file  Social Connections: Not on file  Intimate Partner Violence: Not on file     Allergies  Allergen Reactions   Cat Hair Extract    Milk-Related Compounds    Mixed Ragweed    Peanut-Containing Drug Products      CBC    Component Value Date/Time   WBC 8.9 04/12/2022 0639   RBC 3.35 (L) 04/12/2022 0639   HGB 9.6 (L) 04/12/2022 0639   HCT 29.5 (L) 04/12/2022 0639   PLT 371 04/12/2022 0639   MCV 88.1 04/12/2022 0639   MCH 28.7 04/12/2022 0639   MCHC 32.5 04/12/2022 0639   RDW 13.0 04/12/2022 0639   LYMPHSABS 0.6 (L) 04/12/2022 0639   MONOABS 0.6 04/12/2022 0639   EOSABS 0.2 04/12/2022 0639   BASOSABS 0.0 04/12/2022 3716    Pulmonary Functions Testing Results:     No data to display          Outpatient Medications Prior to Visit  Medication Sig Dispense Refill   alendronate (FOSAMAX) 70 MG tablet Take 70 mg by mouth once a week. Take with a full glass of water on an empty stomach.     amoxicillin-clavulanate (AUGMENTIN) 875-125 MG tablet Take 1 tablet by mouth every 12 (twelve) hours for 26 days. 52 tablet 0   cetirizine (ZYRTEC) 10 MG tablet Take 10 mg by mouth daily.     cholecalciferol (VITAMIN D) 1000 UNITS tablet Take 2,000 Units by mouth daily.     cloZAPine (CLOZARIL) 100 MG tablet Take 400 mg by mouth daily.     coal tar-salicylic acid 2 % shampoo Use to wash scalp Tuesday, Thursday and Saturdays. 120 mL 5   docusate sodium (COLACE) 100 MG capsule Take 100 mg by mouth 2 (two) times daily.     hydrOXYzine (VISTARIL) 25 MG capsule Take 25 mg by mouth 2 (two) times daily.     ketoconazole (NIZORAL) 2 % shampoo APPLY TO AFFECTED AREA TWICE A WEEK FOR 8 WEEKS; THEN USE AS NEEDED. 120 mL 1   letrozole (FEMARA) 2.5 MG tablet Take 1 tablet (2.5 mg total) by mouth daily. 90 tablet 1   LINZESS 145 MCG CAPS capsule Take 145 mcg by mouth daily.     montelukast (SINGULAIR) 5  MG chewable tablet Chew 5 mg by mouth at bedtime.     omeprazole (PRILOSEC) 20 MG capsule Take 20 mg by mouth daily.     oxyCODONE (OXY IR/ROXICODONE) 5 MG immediate release tablet Take 1 tablet (5 mg total) by mouth every 6 (six) hours as needed for moderate pain or severe pain. 10 tablet 0   polyethylene glycol (MIRALAX / GLYCOLAX) 17 g packet Take 17 g by mouth daily. 14 each 0   simvastatin (ZOCOR) 20 MG tablet Take 20 mg by mouth daily.     sulfamethoxazole-trimethoprim (BACTRIM DS) 800-160 MG tablet Take 1 tablet by mouth every 12 (twelve) hours for 26 days. 52 tablet 0   No facility-administered medications prior to visit.

## 2022-04-23 NOTE — Patient Instructions (Signed)
Today, I ordered a repeat CT scan of the chest to follow up on the pneumonia that you are being treated for. I will see you in 6 weeks for follow up.

## 2022-05-02 LAB — HISTOPLASMA ANTIGEN, URINE: Histoplasma Antigen, urine: NEGATIVE (ref <0.5)

## 2022-05-05 ENCOUNTER — Ambulatory Visit
Admission: RE | Admit: 2022-05-05 | Discharge: 2022-05-05 | Disposition: A | Discharge status: Home / Self Care | Payer: Medicare Other | Attending: Infectious Diseases | Admitting: Infectious Diseases

## 2022-05-05 ENCOUNTER — Ambulatory Visit
Admission: RE | Admit: 2022-05-05 | Discharge: 2022-05-05 | Disposition: A | Discharge status: Home / Self Care | Payer: Medicare Other | Source: Ambulatory Visit | Attending: Infectious Diseases | Admitting: Infectious Diseases

## 2022-05-05 ENCOUNTER — Encounter: Payer: Self-pay | Admitting: Infectious Diseases

## 2022-05-05 ENCOUNTER — Ambulatory Visit: Payer: Medicare Other | Attending: Infectious Diseases | Admitting: Infectious Diseases

## 2022-05-05 VITALS — BP 121/83 | HR 99 | Temp 96.8°F | Ht 64.0 in | Wt 115.0 lb

## 2022-05-05 DIAGNOSIS — F17210 Nicotine dependence, cigarettes, uncomplicated: Secondary | ICD-10-CM | POA: Diagnosis not present

## 2022-05-05 DIAGNOSIS — Z853 Personal history of malignant neoplasm of breast: Secondary | ICD-10-CM | POA: Insufficient documentation

## 2022-05-05 DIAGNOSIS — J850 Gangrene and necrosis of lung: Secondary | ICD-10-CM | POA: Diagnosis present

## 2022-05-05 DIAGNOSIS — F319 Bipolar disorder, unspecified: Secondary | ICD-10-CM | POA: Diagnosis not present

## 2022-05-05 DIAGNOSIS — R197 Diarrhea, unspecified: Secondary | ICD-10-CM | POA: Diagnosis not present

## 2022-05-05 NOTE — Patient Instructions (Addendum)
You are here for follow up of the necrotizing pneumonia . You are on Amoxicillin/clavulunate and Trimethoprim/sulfa twice a day until 05/09/22. Your blood test looks fine- Will get CXR today. You have diarrhea- please stop docusate and miralax ( which already has been). Take yogurt

## 2022-05-05 NOTE — Progress Notes (Signed)
NAME: Selena Pittman  DOB: March 19, 1951  MRN: [id number]  Date/Time: 05/05/2022 10:26 AM    ? Selena Pittman is a 72 y.o. female with a history of Bipolar disorder, breast ca s/p mastectomy on letrazole Was recently in [facility] 12/14-12/24/23  for cough and diagnosed with necrotizing pneumonia rt lung Underwent IR directed lung biopsy and malignancy was ruled out. Culture was unfortunately misplaced but pathology slide showed gram positive cocci- She also had MRSA nares positive- in the hospital she wa son linezolid and unasyn and was sent to group home on bactrim and augmentin to complete 4 weeks on 05/09/22 She is here today with her group home owner- she is doing very well- no cough. More energy. Walking- appetite much better No fever Some diarrhea No pain abdomen Past Medical History:  Diagnosis Date   Bipolar affective ([place])    Breast cancer ([place])    Cancer ([place]) 12-11-14   INVASIVE MAMMARY CARCINOMA/.left breast/ T2 N0   Cardiomegaly    Depression    Emphysema of lung ([place])    Endometriosis    Hyperlipidemia    Osteoporosis    Schizoaffective disorder ([place])    Schizophrenia ([place])    Thyroid nodule    Vitamin D deficiency     Past Surgical History:  Procedure Laterality Date   ABDOMINAL HYSTERECTOMY     BREAST BIOPSY Left 12-11-14   INVASIVE MAMMARY CARCINOMA.   BREAST LUMPECTOMY WITH SENTINEL LYMPH NODE BIOPSY Left 12/31/2014   Procedure: LEFT BREAST LUMPECTOMY WITH ULTRASOUND GUIDED NEEDLE LOCALIZATION, SENTINEL LYMPH NODE BIOPSY ;  Surgeon: Christene Lye, MD;  Location: [facility] ORS;  Service: General;  Laterality: Left;   DIAGNOSTIC MAMMOGRAM  12/04/2014   Done at [place] Category 5-Left Breast   DILATION AND CURETTAGE OF UTERUS     MASTECTOMY Left 2016   SIMPLE MASTECTOMY WITH AXILLARY SENTINEL NODE BIOPSY Left 01/14/2015   Procedure: SIMPLE MASTECTOMY;  Surgeon: Christene Lye, MD;  Location: [facility] ORS;  Service: General;  Laterality: Left;   TENDON  REPAIR Right    hand   TONSILLECTOMY     TUBAL LIGATION      Social History   Socioeconomic History   Marital status: Divorced    Spouse name: Not on file   Number of children: Not on file   Years of education: Not on file   Highest education level: Not on file  Occupational History   Not on file  Tobacco Use   Smoking status: Every Day    Packs/day: 1.00    Years: 45.00    Total pack years: 45.00    Types: Cigarettes   Smokeless tobacco: Never   Tobacco comments:    3 cig daily- 04/23/2022  Vaping Use   Vaping Use: Never used  Substance and Sexual Activity   Alcohol use: No    Alcohol/week: 0.0 standard drinks of alcohol   Drug use: No   Sexual activity: Not Currently  Other Topics Concern   Not on file  Social History Narrative   Not on file   Social Determinants of Health   Financial Resource Strain: Not on file  Food Insecurity: Not on file  Transportation Needs: Not on file  Physical Activity: Not on file  Stress: Not on file  Social Connections: Not on file  Intimate Partner Violence: Not on file    Family History  Problem Relation Age of Onset   Cancer Mother        uterine   Heart attack  Father    Breast cancer Neg Hx    Allergies  Allergen Reactions   Cat Hair Extract    Milk-Related Compounds    Mixed Ragweed    Peanut-Containing Drug Products    I? Current Outpatient Medications  Medication Sig Dispense Refill   alendronate (FOSAMAX) 70 MG tablet Take 70 mg by mouth once a week. Take with a full glass of water on an empty stomach.     amoxicillin-clavulanate (AUGMENTIN) 875-125 MG tablet Take 1 tablet by mouth every 12 (twelve) hours for 26 days. 52 tablet 0   cetirizine (ZYRTEC) 10 MG tablet Take 10 mg by mouth daily.     cholecalciferol (VITAMIN D) 1000 UNITS tablet Take 2,000 Units by mouth daily.     cloZAPine (CLOZARIL) 100 MG tablet Take 400 mg by mouth daily.     coal tar-salicylic acid 2 % shampoo Use to wash scalp Tuesday, Thursday  and Saturdays. 120 mL 5   docusate sodium (COLACE) 100 MG capsule Take 100 mg by mouth 2 (two) times daily.     hydrOXYzine (VISTARIL) 25 MG capsule Take 25 mg by mouth 2 (two) times daily.     ketoconazole (NIZORAL) 2 % shampoo APPLY TO AFFECTED AREA TWICE A WEEK FOR 8 WEEKS; THEN USE AS NEEDED. 120 mL 1   letrozole (FEMARA) 2.5 MG tablet Take 1 tablet (2.5 mg total) by mouth daily. 90 tablet 1   LINZESS 145 MCG CAPS capsule Take 145 mcg by mouth daily.     montelukast (SINGULAIR) 5 MG chewable tablet Chew 5 mg by mouth at bedtime.     omeprazole (PRILOSEC) 20 MG capsule Take 20 mg by mouth daily.     oxyCODONE (OXY IR/ROXICODONE) 5 MG immediate release tablet Take 1 tablet (5 mg total) by mouth every 6 (six) hours as needed for moderate pain or severe pain. 10 tablet 0   polyethylene glycol (MIRALAX / GLYCOLAX) 17 g packet Take 17 g by mouth daily. 14 each 0   simvastatin (ZOCOR) 20 MG tablet Take 20 mg by mouth daily.     sulfamethoxazole-trimethoprim (BACTRIM DS) 800-160 MG tablet Take 1 tablet by mouth every 12 (twelve) hours for 26 days. 52 tablet 0   No current facility-administered medications for this visit.     Abtx:  Anti-infectives (From admission, onward)    None       REVIEW OF SYSTEMS:  Const: negative fever, negative chills, negative weight loss Eyes: negative diplopia or visual changes, negative eye pain ENT: negative coryza, negative sore throat Resp: negative cough, hemoptysis, dyspnea Cards: negative for chest pain, palpitations, lower extremity edema GU: negative for frequency, dysuria and hematuria GI: Negative for abdominal pain, diarrhea, bleeding, constipation Skin: negative for rash and pruritus Heme: negative for easy bruising and gum/nose bleeding MS: negative for myalgias, arthralgias, back pain and muscle weakness Neurolo:negative for headaches, dizziness, vertigo, memory problems  Psych: bipoal Endocrine: negative for thyroid,  diabetes Allergy/Immunology- negative for any medication or food allergies ? Pertinent Positives include : Objective:  VITALS:  Ht '5\' 4"'$  (1.626 m)   Wt 115 lb (52.2 kg)   BMI 19.74 kg/m  BP 121/83   Pulse 99   Temp (!) 96.8 F (36 C) (Temporal)   Ht '5\' 4"'$  (1.626 m)   Wt 115 lb (52.2 kg)   BMI 19.74 kg/m    PHYSICAL EXAM:  General: Alert, cooperative, no distress, appears stated age.  Head: Normocephalic, without obvious abnormality, atraumatic. Eyes: Conjunctivae clear, anicteric  sclerae. Pupils are equal Bad dentition Lips, mucosa, and tongue normal. No Thrush Neck: Supple, symmetrical, no adenopathy, thyroid: non tender no carotid bruit and no JVD. Back: No CVA tenderness. Lungs: Clear to auscultation bilaterally. No Wheezing or Rhonchi. No rales. Heart: Regular rate and rhythm, no murmur, rub or gallop. Abdomen: Soft, non-tender,not distended. Bowel sounds normal. No masses Extremities: atraumatic, no cyanosis. No edema. No clubbing Skin: No rashes or lesions. Or bruising Lymph: Cervical, supraclavicular normal. Neurologic: Grossly non-focal Some dyskinesia Pertinent Labs      IMAGING RESULTS:  I have personally reviewed the films ? Impression/Recommendation Necrotizing pneumonia rt lung - on Bactrim and augmentin- will complete 4 weeks on 05/09/22 She is doing so much better She will not need any further antibiotic Will get repeat Cxr today Labs from 04/23/22- Cr  0.7 WBC 3.7 K 5.2 ? Diarrhea- could be from antibiotics- but also taking docusate BID and also was on Miralac which was not given thought for the past few days Asked to stop docusate Take probiotic  ? ___________________________________________________ Discussed with patient,and her group home owner who was accompanying her P.S CXR from today- much better

## 2022-05-07 ENCOUNTER — Inpatient Hospital Stay: Payer: Medicare Other | Admitting: Infectious Diseases

## 2022-05-07 ENCOUNTER — Telehealth: Payer: Self-pay

## 2022-05-07 LAB — HISTOPLASMA GAL'MANNAN AG SER: Histoplasma Gal'mannan Ag Ser: NEGATIVE (ref <0.5)

## 2022-05-07 NOTE — Telephone Encounter (Signed)
Per Dr. Delaine Lame called [facility] to stop patient antibiotics based off lab results.  Spoke with someone at group home who states that office will need to send order to [facility] before they can stop this.  Order written and faxed to [place], RMA

## 2022-06-04 ENCOUNTER — Telehealth: Payer: Self-pay

## 2022-06-04 NOTE — Telephone Encounter (Signed)
Per Dr. Delaine Lame called [facility]  to d/c labs. Spoke with staff and informed them from ID standpoint no labs are needed. Requested they follow up with other provider's to see if they would require labs. Leatrice Jewels, RMA

## 2022-06-05 ENCOUNTER — Ambulatory Visit
Admission: RE | Admit: 2022-06-05 | Discharge: 2022-06-05 | Disposition: A | Discharge status: Home / Self Care | Payer: Medicare Other | Source: Ambulatory Visit | Attending: Student in an Organized Health Care Education/Training Program | Admitting: Student in an Organized Health Care Education/Training Program

## 2022-06-05 DIAGNOSIS — J850 Gangrene and necrosis of lung: Secondary | ICD-10-CM | POA: Insufficient documentation

## 2022-06-05 MED ORDER — IOHEXOL 300 MG/ML  SOLN
60.0000 mL | Freq: Once | INTRAMUSCULAR | Status: AC | PRN
  Administered 2022-06-05: 100 mL via INTRAVENOUS

## 2022-06-15 ENCOUNTER — Encounter: Payer: Self-pay | Admitting: Student in an Organized Health Care Education/Training Program

## 2022-06-15 ENCOUNTER — Ambulatory Visit (INDEPENDENT_AMBULATORY_CARE_PROVIDER_SITE_OTHER): Payer: Medicare Other | Admitting: Student in an Organized Health Care Education/Training Program

## 2022-06-15 VITALS — BP 124/78 | HR 100 | Temp 97.8°F | Ht 64.0 in | Wt 113.0 lb

## 2022-06-15 DIAGNOSIS — J850 Gangrene and necrosis of lung: Secondary | ICD-10-CM | POA: Diagnosis not present

## 2022-06-15 NOTE — Progress Notes (Signed)
Synopsis: Referred in for necrotic pneumonia by Selena Haggard, FNP  Assessment & Plan:   1. Necrotizing pneumonia ([place]) 2. Lung Scarring  Patient is presenting for follow up regarding her RUL necrotic infiltrate seen on CT scan of the chest. She underwent a CT guided biopsy of the lesion with pathology overall consistent with an abscess (necro-inflammatory debris, abscess, positive gram stain within the necro-inflammatory debris). She has finished a prolonged course of antibiotics in coordination with our infectious diseases colleagues. Repeat CT scan on 06/05/2022 showed a scar like appearance in the RUL consistent with resolved pulmonary abscess with no residual collection or obstructive lesion noted. No further follow up is necessary for these findings.  Return if symptoms worsen or fail to improve.  I spent 20 minutes caring for this patient today, including preparing to see the patient, obtaining a medical history , reviewing a separately obtained history, performing a medically appropriate examination and/or evaluation, counseling and educating the patient/family/caregiver, documenting clinical information in the electronic health record, and independently interpreting results (not separately reported/billed) and communicating results to the patient/family/caregiver  Selena Reichert, MD [facility] 06/15/2022 9:59 AM    End of visit medications:  No orders of the defined types were placed in this encounter.    Current Outpatient Medications:    alendronate (FOSAMAX) 70 MG tablet, Take 70 mg by mouth once a week. Take with a full glass of water on an empty stomach., Disp: , Rfl:    cetirizine (ZYRTEC) 10 MG tablet, Take 10 mg by mouth daily., Disp: , Rfl:    cholecalciferol (VITAMIN D) 1000 UNITS tablet, Take 2,000 Units by mouth daily., Disp: , Rfl:    cloZAPine (CLOZARIL) 100 MG tablet, Take 400 mg by mouth daily., Disp: , Rfl:    coal tar-salicylic acid 2  % shampoo, Use to wash scalp Tuesday, Thursday and Saturdays., Disp: 120 mL, Rfl: 5   docusate sodium (COLACE) 100 MG capsule, Take 100 mg by mouth 2 (two) times daily., Disp: , Rfl:    hydrOXYzine (VISTARIL) 25 MG capsule, Take 25 mg by mouth 2 (two) times daily., Disp: , Rfl:    ketoconazole (NIZORAL) 2 % shampoo, APPLY TO AFFECTED AREA TWICE A WEEK FOR 8 WEEKS; THEN USE AS NEEDED., Disp: 120 mL, Rfl: 1   letrozole (FEMARA) 2.5 MG tablet, Take 1 tablet (2.5 mg total) by mouth daily., Disp: 90 tablet, Rfl: 1   LINZESS 145 MCG CAPS capsule, Take 145 mcg by mouth daily., Disp: , Rfl:    montelukast (SINGULAIR) 5 MG chewable tablet, Chew 5 mg by mouth at bedtime., Disp: , Rfl:    omeprazole (PRILOSEC) 20 MG capsule, Take 20 mg by mouth daily., Disp: , Rfl:    oxyCODONE (OXY IR/ROXICODONE) 5 MG immediate release tablet, Take 1 tablet (5 mg total) by mouth every 6 (six) hours as needed for moderate pain or severe pain., Disp: 10 tablet, Rfl: 0   polyethylene glycol (MIRALAX / GLYCOLAX) 17 g packet, Take 17 g by mouth daily., Disp: 14 each, Rfl: 0   simvastatin (ZOCOR) 20 MG tablet, Take 20 mg by mouth daily., Disp: , Rfl:    Subjective:   PATIENT ID: Selena Pittman GENDER: female DOB: 1951-02-05, MRN: [id number]  Chief Complaint  Patient presents with   Follow-up    CT 06/05/2022    HPI  Selena Pittman is a pleasant 72 year old female presenting to clinic for follow up of a RUL necrotic pneumonia.  The  patient has a history of schizophrenia and is a resident of a nursing facility. She had suffered a fall on 03/28/2022 requiring a visit to the ED. She re-presented to the ED on 04/02/2022 secondary to weakness, and was found to have a RUL infiltrate requiring admission. She was admitted 04/02/2022-04/12/2022 for further workup and management. She underwent a CT scan of the chest on 04/02/2022 notable for a heterogenous mass in the right hemithorax with central necrosis. She underwent an  infectious workup (overall negative for a specific organism) and was started on broad spectrum antibiotics. She underwent a CT guided core needle biopsy on 04/09/2022. Pathology was notable for reactive fibrous stroma, focal skeletal muscle, with acute inflammation and abscess formation. Gram stain was positive for gram positive cocci in the necro-inflammatory debris. The patient was discharged on 04/12/2022 to finish a course of antibiotics (Augmentin and Bactrim). She is followed by Selena Pittman from infectious diseases. She was unable to see Selena Pittman from [facility] pulmonology who had seen her in the hospital due to insurance reasons and was scheduled to see me in clinic.  Today, she presents in the presence of her caregiver. She currently resides at the nursing facility due to her history of schizophrenia. She reports no respiratory symptoms. She does not have a cough, sputum production, or hemoptysis. She denies any chest pain, chest tightness, wheezing, or shortness of breath. She denies any night sweats or weight loss. The patient and her caregiver deny any difficulty swallowing or episodes of aspiration.  She underwent a repeat Chest CT on 06/05/2022 showing a Scar-like appearance in the right upper lobe consistent with resolved pulmonary abscess. No residual collection or evidence of obstructing airway lesion.  Ancillary information including prior medications, full medical/surgical/family/social histories, and PFTs (when available) are listed below and have been reviewed.   Review of Systems  Constitutional:  Negative for chills, fever, malaise/fatigue and weight loss.  Respiratory:  Negative for cough, hemoptysis, sputum production, shortness of breath and wheezing.   Cardiovascular:  Negative for chest pain, leg swelling and PND.     Objective:   Vitals:   06/15/22 0958  BP: 124/78  Pulse: 100  Temp: 97.8 F (36.6 C)  TempSrc: Temporal  SpO2: 100%  Weight: 113 lb (51.3 kg)   Height: '5\' 4"'$  (1.626 m)   100% on RA BMI Readings from Last 3 Encounters:  06/15/22 19.40 kg/m  05/05/22 19.74 kg/m  04/23/22 19.40 kg/m   Wt Readings from Last 3 Encounters:  06/15/22 113 lb (51.3 kg)  05/05/22 115 lb (52.2 kg)  04/23/22 113 lb (51.3 kg)    Physical Exam Constitutional:      Appearance: She is not ill-appearing.  HENT:     Head: Normocephalic.     Nose: Nose normal.     Mouth/Throat:     Mouth: Mucous membranes are moist.  Cardiovascular:     Rate and Rhythm: Normal rate and regular rhythm.  Pulmonary:     Effort: Pulmonary effort is normal.     Breath sounds: No wheezing, rhonchi or rales.     Comments: Decreased breath sounds anteriorly over the right upper lung field Abdominal:     Palpations: Abdomen is soft.  Musculoskeletal:        General: No swelling.     Cervical back: Normal range of motion.     Right lower leg: No edema.     Left lower leg: No edema.  Skin:    General: Skin is warm.  Neurological:     General: No focal deficit present.     Mental Status: She is alert.       Ancillary Information    Past Medical History:  Diagnosis Date   Bipolar affective ([place])    Breast cancer ([place])    Cancer ([place]) 12-11-14   INVASIVE MAMMARY CARCINOMA/.left breast/ T2 N0   Cardiomegaly    Depression    Emphysema of lung ([place])    Endometriosis    Hyperlipidemia    Osteoporosis    Schizoaffective disorder ([facility])    Schizophrenia ([facility])    Thyroid nodule    Vitamin D deficiency      Family History  Problem Relation Age of Onset   Cancer Mother        uterine   Heart attack Father    Breast cancer Neg Hx      Past Surgical History:  Procedure Laterality Date   ABDOMINAL HYSTERECTOMY     BREAST BIOPSY Left 12-11-14   INVASIVE MAMMARY CARCINOMA.   BREAST LUMPECTOMY WITH SENTINEL LYMPH NODE BIOPSY Left 12/31/2014   Procedure: LEFT BREAST LUMPECTOMY WITH ULTRASOUND GUIDED NEEDLE LOCALIZATION, SENTINEL LYMPH NODE BIOPSY ;  Surgeon:  Christene Lye, MD;  Location: [facility] ORS;  Service: General;  Laterality: Left;   DIAGNOSTIC MAMMOGRAM  12/04/2014   Done at [place] Category 5-Left Breast   DILATION AND CURETTAGE OF UTERUS     MASTECTOMY Left 2016   SIMPLE MASTECTOMY WITH AXILLARY SENTINEL NODE BIOPSY Left 01/14/2015   Procedure: SIMPLE MASTECTOMY;  Surgeon: Christene Lye, MD;  Location: [facility] ORS;  Service: General;  Laterality: Left;   TENDON REPAIR Right    hand   TONSILLECTOMY     TUBAL LIGATION      Social History   Socioeconomic History   Marital status: Divorced    Spouse name: Not on file   Number of children: Not on file   Years of education: Not on file   Highest education level: Not on file  Occupational History   Not on file  Tobacco Use   Smoking status: Every Day    Packs/day: 1.00    Years: 45.00    Total pack years: 45.00    Types: Cigarettes   Smokeless tobacco: Never   Tobacco comments:    5 cig daily- 06/15/2022  Vaping Use   Vaping Use: Never used  Substance and Sexual Activity   Alcohol use: No    Alcohol/week: 0.0 standard drinks of alcohol   Drug use: No   Sexual activity: Not Currently  Other Topics Concern   Not on file  Social History Narrative   Not on file   Social Determinants of Health   Financial Resource Strain: Not on file  Food Insecurity: Not on file  Transportation Needs: Not on file  Physical Activity: Not on file  Stress: Not on file  Social Connections: Not on file  Intimate Partner Violence: Not on file     Allergies  Allergen Reactions   Cat Hair Extract    Milk-Related Compounds    Mixed Ragweed    Peanut-Containing Drug Products      CBC    Component Value Date/Time   WBC 8.9 04/12/2022 0639   RBC 3.35 (L) 04/12/2022 0639   HGB 9.6 (L) 04/12/2022 0639   HCT 29.5 (L) 04/12/2022 0639   PLT 371 04/12/2022 0639   MCV 88.1 04/12/2022 0639   MCH 28.7 04/12/2022 0639   MCHC 32.5 04/12/2022 [id number]  RDW 13.0 04/12/2022 0639    LYMPHSABS 0.6 (L) 04/12/2022 0639   MONOABS 0.6 04/12/2022 0639   EOSABS 0.2 04/12/2022 0639   BASOSABS 0.0 04/12/2022 [id number]    Pulmonary Functions Testing Results:     No data to display          Outpatient Medications Prior to Visit  Medication Sig Dispense Refill   alendronate (FOSAMAX) 70 MG tablet Take 70 mg by mouth once a week. Take with a full glass of water on an empty stomach.     cetirizine (ZYRTEC) 10 MG tablet Take 10 mg by mouth daily.     cholecalciferol (VITAMIN D) 1000 UNITS tablet Take 2,000 Units by mouth daily.     cloZAPine (CLOZARIL) 100 MG tablet Take 400 mg by mouth daily.     coal tar-salicylic acid 2 % shampoo Use to wash scalp Tuesday, Thursday and Saturdays. 120 mL 5   docusate sodium (COLACE) 100 MG capsule Take 100 mg by mouth 2 (two) times daily.     hydrOXYzine (VISTARIL) 25 MG capsule Take 25 mg by mouth 2 (two) times daily.     ketoconazole (NIZORAL) 2 % shampoo APPLY TO AFFECTED AREA TWICE A WEEK FOR 8 WEEKS; THEN USE AS NEEDED. 120 mL 1   letrozole (FEMARA) 2.5 MG tablet Take 1 tablet (2.5 mg total) by mouth daily. 90 tablet 1   LINZESS 145 MCG CAPS capsule Take 145 mcg by mouth daily.     montelukast (SINGULAIR) 5 MG chewable tablet Chew 5 mg by mouth at bedtime.     omeprazole (PRILOSEC) 20 MG capsule Take 20 mg by mouth daily.     oxyCODONE (OXY IR/ROXICODONE) 5 MG immediate release tablet Take 1 tablet (5 mg total) by mouth every 6 (six) hours as needed for moderate pain or severe pain. 10 tablet 0   polyethylene glycol (MIRALAX / GLYCOLAX) 17 g packet Take 17 g by mouth daily. 14 each 0   simvastatin (ZOCOR) 20 MG tablet Take 20 mg by mouth daily.     No facility-administered medications prior to visit.

## 2022-07-15 ENCOUNTER — Emergency Department: Payer: Medicare Other

## 2022-07-15 ENCOUNTER — Emergency Department
Admission: EM | Admit: 2022-07-15 | Discharge: 2022-07-15 | Disposition: A | Discharge status: Home / Self Care | Payer: Medicare Other | Attending: Emergency Medicine | Admitting: Emergency Medicine

## 2022-07-15 DIAGNOSIS — M25572 Pain in left ankle and joints of left foot: Secondary | ICD-10-CM | POA: Insufficient documentation

## 2022-07-15 DIAGNOSIS — M25562 Pain in left knee: Secondary | ICD-10-CM | POA: Insufficient documentation

## 2022-07-15 DIAGNOSIS — M79605 Pain in left leg: Secondary | ICD-10-CM | POA: Diagnosis not present

## 2022-07-15 DIAGNOSIS — Z853 Personal history of malignant neoplasm of breast: Secondary | ICD-10-CM | POA: Insufficient documentation

## 2022-07-15 MED ORDER — HYDROCODONE-ACETAMINOPHEN 5-325 MG PO TABS: 1.0000 | ORAL_TABLET | ORAL | 0 refills | Status: DC | PRN

## 2022-07-15 MED ORDER — HYDROCODONE-ACETAMINOPHEN 5-325 MG PO TABS
1.0000 | ORAL_TABLET | Freq: Once | ORAL | Status: AC
  Administered 2022-07-15: 1 via ORAL
  Filled 2022-07-15: qty 1

## 2022-07-15 NOTE — Discharge Instructions (Addendum)
Please use ibuprofen (Motrin) up to 800 mg every 8 hours, naproxen (Naprosyn) up to 500 mg every 12 hours, and/or acetaminophen (Tylenol) up to 4 g/day for any continued pain.  Please do not use this medication regimen for longer than 7 days 

## 2022-07-15 NOTE — ED Notes (Signed)
Pt's group home and legal guardian (son [name]) both called and updated on pt status. Group home worker to pick up pt from ER within the hour.

## 2022-07-15 NOTE — ED Triage Notes (Signed)
Pt arrives via [facility] from [facility]. Pt reports that yesterday she was seated in a chair and "stood up too fast" and fell back onto the chair, injuring her L leg. Pt tender on her L leg from the knee distally. PMS intact.

## 2022-07-15 NOTE — ED Provider Notes (Signed)
[facility] Provider Note   Event Date/Time   First MD Initiated Contact with Patient 07/15/22 [phone]     (approximate) History  Fall and Foot Pain  HPI Selena Pittman is a 72 y.o. female with past medical history of schizophrenia, hypokalemia, emphysema, and status post breast cancer in the left breast who presents complaining of left lower extremity pain after she stood up too fast and lost her footing falling back onto the chair behind her.  Patient is now complaining of 10/10 left knee pain down into her foot that is worsened with any ambulation, palpation over the area, or range of motion at these joints.  Patient denies any distal weakness/numbness/paresthesia. ROS: Patient currently denies any vision changes, tinnitus, difficulty speaking, facial droop, sore throat, chest pain, shortness of breath, abdominal pain, nausea/vomiting/diarrhea, dysuria   Physical Exam  Triage Vital Signs: ED Triage Vitals  Enc Vitals Group     BP 07/15/22 0900 122/74     Pulse Rate 07/15/22 0900 100     Resp 07/15/22 0900 20     Temp 07/15/22 0900 98.5 F (36.9 C)     Temp Source 07/15/22 0900 Oral     SpO2 07/15/22 0900 98 %     Weight 07/15/22 0901 110 lb (49.9 kg)     Height 07/15/22 0901 5\' 4"  (1.626 m)     Head Circumference --      Peak Flow --      Pain Score 07/15/22 0859 10     Pain Loc --      Pain Edu? --      Excl. in [place]? --    Most recent vital signs: Vitals:   07/15/22 0900  BP: 122/74  Pulse: 100  Resp: 20  Temp: 98.5 F (36.9 C)  SpO2: 98%   General: Awake, oriented x4. CV:  Good peripheral perfusion.  Resp:  Normal effort.  Abd:  No distention.  Other:  Elderly cachectic Caucasian female laying in bed in no acute distress.  Tender to range of motion at the left knee, left ankle.  Patient able to wiggle toes and arranged foot at the ankle without significant pain however patient states she is unable to move her leg at the knee or hip.   Distally neurovascularly intact ED Results / Procedures / Treatments  RADIOLOGY ED MD interpretation: X-ray of the left tibia, fibula, ankle, and knee interpreted independently by me and shows no evidence of acute abnormalities. -Agree with radiology assessment Official radiology report(s): DG Tibia/Fibula Left  Result Date: 07/15/2022 CLINICAL DATA:  Fall yesterday with left lower extremity pain from knee to foot. EXAM: LEFT TIBIA AND FIBULA - 2 VIEW COMPARISON:  None Available. FINDINGS: There is no acute fracture or dislocation. Bony alignment is normal. The soft tissues are unremarkable. IMPRESSION: No evidence of acute injury in the tibia/fibula. Electronically Signed   By: Valetta Mole M.D.   On: 07/15/2022 09:37   DG Ankle Complete Left  Result Date: 07/15/2022 CLINICAL DATA:  Fall, left lower extremity EXAM: LEFT ANKLE COMPLETE - 3+ VIEW COMPARISON:  None Available. FINDINGS: Plantar calcaneal spur. Mild dorsal spurring at the talonavicular articulation. The malleoli, plafond, and talar dome appear intact. No acute bony findings. IMPRESSION: 1. No acute findings. 2. Plantar calcaneal spur. Electronically Signed   By: Van Clines M.D.   On: 07/15/2022 09:36   DG Knee Complete 4 Views Left  Result Date: 07/15/2022 CLINICAL DATA:  Fall EXAM: LEFT KNEE -  COMPLETE 4 VIEW COMPARISON:  None Available. FINDINGS: No knee joint effusion. No fracture. No dislocation. No radiopaque foreign body. Mild vascular calcifications. Mild tricompartmental degenerative change IMPRESSION: No fracture or dislocation. Mild tricompartmental degenerative change. Electronically Signed   By: Marin Roberts M.D.   On: 07/15/2022 09:26   PROCEDURES: Critical Care performed: No Procedures MEDICATIONS ORDERED IN ED: Medications  HYDROcodone-acetaminophen (NORCO/VICODIN) 5-325 MG per tablet 1 tablet (1 tablet Oral Given 07/15/22 0950)   IMPRESSION / MDM / ASSESSMENT AND PLAN / ED COURSE  I reviewed the triage  vital signs and the nursing notes.                             The patient is on the cardiac monitor to evaluate for evidence of arrhythmia and/or significant heart rate changes. Patient's presentation is most consistent with acute presentation with potential threat to life or bodily function. 72 year old female presents for Given history, exam and workup I have low suspicion for fracture, dislocation, significant ligamentous injury, septic arthritis, gout flare, new autoimmune arthropathy, or gonococcal arthropathy.  Interventions: *** Disposition: Discharge home with strict return precautions and instructions for prompt primary care follow up in the next week.   FINAL CLINICAL IMPRESSION(S) / ED DIAGNOSES   Final diagnoses:  Left leg pain  Acute pain of left knee   Rx / DC Orders   ED Discharge Orders          Ordered    HYDROcodone-acetaminophen (NORCO) 5-325 MG tablet  Every 4 hours PRN        07/15/22 0956           Note:  This document was prepared using Dragon voice recognition software and may include unintentional dictation errors.

## 2022-09-14 ENCOUNTER — Emergency Department
Admission: EM | Admit: 2022-09-14 | Discharge: 2022-09-14 | Disposition: A | Discharge status: Home / Self Care | Payer: Medicare Other | Attending: Emergency Medicine | Admitting: Emergency Medicine

## 2022-09-14 ENCOUNTER — Other Ambulatory Visit: Payer: Self-pay

## 2022-09-14 ENCOUNTER — Emergency Department: Payer: Medicare Other

## 2022-09-14 DIAGNOSIS — N390 Urinary tract infection, site not specified: Secondary | ICD-10-CM | POA: Diagnosis not present

## 2022-09-14 DIAGNOSIS — K5900 Constipation, unspecified: Secondary | ICD-10-CM | POA: Insufficient documentation

## 2022-09-14 LAB — COMPREHENSIVE METABOLIC PANEL
ALT: 19 U/L (ref 0–44)
AST: 23 U/L (ref 15–41)
Albumin: 4.2 g/dL (ref 3.5–5.0)
Alkaline Phosphatase: 91 U/L (ref 38–126)
Anion gap: 9 (ref 5–15)
BUN: 11 mg/dL (ref 8–23)
CO2: 26 mmol/L (ref 22–32)
Calcium: 9.3 mg/dL (ref 8.9–10.3)
Chloride: 104 mmol/L (ref 98–111)
Creatinine, Ser: 0.84 mg/dL (ref 0.44–1.00)
GFR, Estimated: >60 mL/min (ref >60)
Glucose, Bld: 132 mg/dL — ABNORMAL HIGH (ref 70–99)
Potassium: 3.6 mmol/L (ref 3.5–5.1)
Sodium: 139 mmol/L (ref 135–145)
Total Bilirubin: 0.6 mg/dL (ref 0.3–1.2)
Total Protein: 6.8 g/dL (ref 6.5–8.1)

## 2022-09-14 LAB — URINALYSIS, ROUTINE W REFLEX MICROSCOPIC
Bacteria, UA: NONE SEEN
Bilirubin Urine: NEGATIVE
Glucose, UA: NEGATIVE mg/dL
Ketones, ur: NEGATIVE mg/dL
Nitrite: NEGATIVE
Protein, ur: NEGATIVE mg/dL
Specific Gravity, Urine: 1.02 (ref 1.005–1.030)
pH: 7 (ref 5.0–8.0)

## 2022-09-14 LAB — CBC WITH DIFFERENTIAL/PLATELET
Abs Immature Granulocytes: 0.02 10*3/uL (ref 0.00–0.07)
Basophils Absolute: 0 10*3/uL (ref 0.0–0.1)
Basophils Relative: 0 %
Eosinophils Absolute: 0 10*3/uL (ref 0.0–0.5)
Eosinophils Relative: 1 %
HCT: 41 % (ref 36.0–46.0)
Hemoglobin: 13.3 g/dL (ref 12.0–15.0)
Immature Granulocytes: 0 %
Lymphocytes Relative: 12 %
Lymphs Abs: 0.5 10*3/uL — ABNORMAL LOW (ref 0.7–4.0)
MCH: 29.8 pg (ref 26.0–34.0)
MCHC: 32.4 g/dL (ref 30.0–36.0)
MCV: 91.7 fL (ref 80.0–100.0)
Monocytes Absolute: 0.4 10*3/uL (ref 0.1–1.0)
Monocytes Relative: 8 %
Neutro Abs: 3.7 10*3/uL (ref 1.7–7.7)
Neutrophils Relative %: 79 %
Platelets: 206 10*3/uL (ref 150–400)
RBC: 4.47 MIL/uL (ref 3.87–5.11)
RDW: 13.5 % (ref 11.5–15.5)
WBC: 4.7 10*3/uL (ref 4.0–10.5)
nRBC: 0 % (ref 0.0–0.2)

## 2022-09-14 LAB — LIPASE, BLOOD: Lipase: 29 U/L (ref 11–51)

## 2022-09-14 MED ORDER — NITROFURANTOIN MONOHYD MACRO 100 MG PO CAPS: 100.0000 mg | ORAL_CAPSULE | Freq: Two times a day (BID) | ORAL | 0 refills | Status: DC

## 2022-09-14 MED ORDER — IOHEXOL 300 MG/ML  SOLN
100.0000 mL | Freq: Once | INTRAMUSCULAR | Status: AC | PRN
  Administered 2022-09-14: 100 mL via INTRAVENOUS

## 2022-09-14 MED ORDER — LACTULOSE 10 G PO PACK: 10.0000 g | PACK | Freq: Every day | ORAL | 0 refills | Status: DC

## 2022-09-14 MED ORDER — LACTULOSE 10 GM/15ML PO SOLN
30.0000 g | Freq: Once | ORAL | Status: AC
  Administered 2022-09-14: 30 g via ORAL
  Filled 2022-09-14: qty 60

## 2022-09-14 MED ORDER — MINERAL OIL RE ENEM: 1.0000 | ENEMA | Freq: Once | RECTAL | Status: DC

## 2022-09-14 MED ORDER — NITROFURANTOIN MONOHYD MACRO 100 MG PO CAPS: 100.0000 mg | ORAL_CAPSULE | Freq: Two times a day (BID) | ORAL | 0 refills | Status: AC

## 2022-09-14 MED ORDER — NITROFURANTOIN MONOHYD MACRO 100 MG PO CAPS
100.0000 mg | ORAL_CAPSULE | Freq: Once | ORAL | Status: AC
  Administered 2022-09-14: 100 mg via ORAL
  Filled 2022-09-14: qty 1

## 2022-09-14 NOTE — Discharge Instructions (Addendum)
Your CAT scan showed that you are significantly constipated.  You did not want an enema in the ER.  I would increase the dose of MiraLAX from once daily to twice daily.  You can also add on lactulose which I prescribed.  If you are not having a bowel movement in 1 week you should try enemas at home.  You also likely have a urinary tract infection.  Please take the antibiotic twice a day for the next 5 days.

## 2022-09-14 NOTE — ED Triage Notes (Signed)
Pt arrived via EMS from [facility], c/o constipation with no BM x1 week. Pt denies any abdominal pain and states this is chronic.

## 2022-09-14 NOTE — ED Provider Notes (Signed)
[facility] Provider Note    Event Date/Time   First MD Initiated Contact with Patient 09/14/22 1455     (approximate)   History   Constipation   HPI  Lore Lopez is a 72 y.o. female past medical history of schizophrenia, cancer, hyperlipidemia who presents because of constipation and abdominal pain.  Patient tells me that she has had upset stomach for many years.  She does have some discomfort now points to the left lower abdomen when asked the location of her pain.  Denies nausea vomiting.  Says she is eating and drinking well.  Has not had a BM in 7 days.  Does have long history of constipation tells me that when she used to live with her mom she would give her a weekly enema.  Has not had any enemas this week.  She is not sure if she is on a stool softener.  Typically will go about 2-3 times per week.  Denies fevers chills or urinary symptoms.   Past Medical History:  Diagnosis Date   Bipolar affective (HCC)    Breast cancer (HCC)    Cancer (HCC) 12-11-14   INVASIVE MAMMARY CARCINOMA/.left breast/ T2 N0   Cardiomegaly    Depression    Emphysema of lung (HCC)    Endometriosis    Hyperlipidemia    Osteoporosis    Schizoaffective disorder (HCC)    Schizophrenia (HCC)    Thyroid nodule    Vitamin D deficiency     Patient Active Problem List   Diagnosis Date Noted   Generalized weakness 04/12/2022   Acute cystitis without hematuria 04/09/2022   Necrotizing pneumonia (HCC) 04/06/2022   Sepsis (HCC) 04/02/2022   Tobacco use 04/02/2022   GERD (gastroesophageal reflux disease) 04/02/2022   Schizophrenia (HCC) 04/02/2022   Leukocytosis 04/02/2022   Hypokalemia 04/02/2022   Lung mass 04/02/2022   Malignant neoplasm of left female breast (HCC) 06/05/2015     Physical Exam  Triage Vital Signs: ED Triage Vitals  Enc Vitals Group     BP 09/14/22 1422 (!) 130/92     Pulse Rate 09/14/22 1422 100     Resp 09/14/22 1422 16     Temp 09/14/22  1422 97.8 F (36.6 C)     Temp Source 09/14/22 1422 Oral     SpO2 09/14/22 1422 100 %     Weight 09/14/22 1423 110 lb (49.9 kg)     Height 09/14/22 1423 5\' 4"  (1.626 m)     Head Circumference --      Peak Flow --      Pain Score 09/14/22 1423 0     Pain Loc --      Pain Edu? --      Excl. in GC? --     Most recent vital signs: Vitals:   09/14/22 1422 09/14/22 1824  BP: (!) 130/92 128/88  Pulse: 100 98  Resp: 16 16  Temp: 97.8 F (36.6 C) 98 F (36.7 C)  SpO2: 100% 100%     General: Awake, no distress.  CV:  Good peripheral perfusion.  Resp:  Normal effort.  Abd:  Abdomen is distended but soft, tenderness to palpation in the left lower quadrant Neuro:             Awake, Alert, Oriented x 3  Other:  Hard stool in the rectal vault   ED Results / Procedures / Treatments  Labs (all labs ordered are listed, but only abnormal results are displayed)  Labs Reviewed  COMPREHENSIVE METABOLIC PANEL - Abnormal; Notable for the following components:      Result Value   Glucose, Bld 132 (*)    All other components within normal limits  CBC WITH DIFFERENTIAL/PLATELET - Abnormal; Notable for the following components:   Lymphs Abs 0.5 (*)    All other components within normal limits  URINALYSIS, ROUTINE W REFLEX MICROSCOPIC - Abnormal; Notable for the following components:   Color, Urine STRAW (*)    APPearance CLEAR (*)    Hgb urine dipstick SMALL (*)    Leukocytes,Ua MODERATE (*)    All other components within normal limits  URINE CULTURE  LIPASE, BLOOD     EKG     RADIOLOGY I reviewed interpreted the CT the abdomen pelvis which shows significant constipation no bowel obstruction  PROCEDURES:  Critical Care performed: No  Procedures    MEDICATIONS ORDERED IN ED: Medications  mineral oil enema 1 enema (1 enema Rectal Not Given 09/14/22 1819)  nitrofurantoin (macrocrystal-monohydrate) (MACROBID) capsule 100 mg (has no administration in time range)  lactulose  (CHRONULAC) 10 GM/15ML solution 30 g (has no administration in time range)  iohexol (OMNIPAQUE) 300 MG/ML solution 100 mL (100 mLs Intravenous Contrast Given 09/14/22 1636)     IMPRESSION / MDM / ASSESSMENT AND PLAN / ED COURSE  I reviewed the triage vital signs and the nursing notes.                              Patient's presentation is most consistent with acute complicated illness / injury requiring diagnostic workup.  Differential diagnosis includes, but is not limited to, constipation, bowel obstruction, malignancy, diverticulitis, UTI  The patient is a 72 year old female presents with abdominal pain and constipation.  Has not had a bowel movement in 7 days.  Typically will go about 2-3 times per week but has had times where she has gone a week without having a bowel movement.  Denies nausea vomiting still eating and drinking.  Pain is located in the left lower quadrant.  Patient's abdomen is distended and she does have tenderness in the left lower quadrant but the abdomen is soft.  When I initially saw the patient she said she urgently needed to urinate.  When I walked her to use the bathroom she was not able to pee.  Will obtain a bladder scan to evaluate for urinary retention.  Given her distention and pain we will obtain a CT of the abdomen pelvis.  Will also obtain basic labs CBC and CMP.  Patient's labs are overall reassuring.  CT shows significant stool burden but no acute abdominal process otherwise.  On rectal exam she does have some hard stool in the rectal vault.  Patient tells me that she actually had a fairly good sized stool while in the ER.  She initially was resistant to enema but was then agreeable.  She was not interested in disimpaction manually.  Patient later refused enema.  Urinalysis has 11-20 white cells with no bacteria moderate leuks.  Will send for culture.  Patient did get up repeatedly to urinate and has been having urgency so we will treat as UTI with macrobid,  as her Cr is nml and I have low suspicion for upper tract infection.        FINAL CLINICAL IMPRESSION(S) / ED DIAGNOSES   Final diagnoses:  Constipation, unspecified constipation type  Urinary tract infection without hematuria, site unspecified  Rx / DC Orders   ED Discharge Orders          Ordered    nitrofurantoin, macrocrystal-monohydrate, (MACROBID) 100 MG capsule  2 times daily        09/14/22 1832    lactulose (CEPHULAC) 10 g packet  Daily        09/14/22 1834             Note:  This document was prepared using Dragon voice recognition software and may include unintentional dictation errors.   Georga Hacking, MD 09/14/22 [phone]

## 2022-09-16 ENCOUNTER — Other Ambulatory Visit: Payer: Self-pay

## 2022-09-16 ENCOUNTER — Emergency Department: Payer: Medicare Other

## 2022-09-16 ENCOUNTER — Emergency Department
Admission: EM | Admit: 2022-09-16 | Discharge: 2022-09-16 | Disposition: A | Discharge status: Home / Self Care | Payer: Medicare Other | Attending: Emergency Medicine | Admitting: Emergency Medicine

## 2022-09-16 DIAGNOSIS — Z853 Personal history of malignant neoplasm of breast: Secondary | ICD-10-CM | POA: Diagnosis not present

## 2022-09-16 DIAGNOSIS — R109 Unspecified abdominal pain: Secondary | ICD-10-CM

## 2022-09-16 DIAGNOSIS — K5900 Constipation, unspecified: Secondary | ICD-10-CM | POA: Insufficient documentation

## 2022-09-16 LAB — URINALYSIS, ROUTINE W REFLEX MICROSCOPIC
Bilirubin Urine: NEGATIVE
Glucose, UA: NEGATIVE mg/dL
Ketones, ur: 20 mg/dL — AB
Nitrite: NEGATIVE
Protein, ur: NEGATIVE mg/dL
Specific Gravity, Urine: 1.019 (ref 1.005–1.030)
WBC, UA: >50 WBC/hpf (ref 0–5)
pH: 6 (ref 5.0–8.0)

## 2022-09-16 LAB — CBC
HCT: 41.5 % (ref 36.0–46.0)
Hemoglobin: 13.4 g/dL (ref 12.0–15.0)
MCH: 29.9 pg (ref 26.0–34.0)
MCHC: 32.3 g/dL (ref 30.0–36.0)
MCV: 92.6 fL (ref 80.0–100.0)
Platelets: 178 10*3/uL (ref 150–400)
RBC: 4.48 MIL/uL (ref 3.87–5.11)
RDW: 13.9 % (ref 11.5–15.5)
WBC: 5.6 10*3/uL (ref 4.0–10.5)
nRBC: 0 % (ref 0.0–0.2)

## 2022-09-16 LAB — HEPATIC FUNCTION PANEL
ALT: 16 U/L (ref 0–44)
AST: 18 U/L (ref 15–41)
Albumin: 4 g/dL (ref 3.5–5.0)
Alkaline Phosphatase: 82 U/L (ref 38–126)
Bilirubin, Direct: 0.2 mg/dL (ref 0.0–0.2)
Indirect Bilirubin: 0.7 mg/dL (ref 0.3–0.9)
Total Bilirubin: 0.9 mg/dL (ref 0.3–1.2)
Total Protein: 6.4 g/dL — ABNORMAL LOW (ref 6.5–8.1)

## 2022-09-16 LAB — BASIC METABOLIC PANEL
Anion gap: 9 (ref 5–15)
BUN: 14 mg/dL (ref 8–23)
CO2: 26 mmol/L (ref 22–32)
Calcium: 9.1 mg/dL (ref 8.9–10.3)
Chloride: 108 mmol/L (ref 98–111)
Creatinine, Ser: 0.93 mg/dL (ref 0.44–1.00)
GFR, Estimated: >60 mL/min (ref >60)
Glucose, Bld: 107 mg/dL — ABNORMAL HIGH (ref 70–99)
Potassium: 3.7 mmol/L (ref 3.5–5.1)
Sodium: 143 mmol/L (ref 135–145)

## 2022-09-16 LAB — URINE CULTURE

## 2022-09-16 LAB — LIPASE, BLOOD: Lipase: 27 U/L (ref 11–51)

## 2022-09-16 MED ORDER — GLYCERIN (ADULT) 2 G RE SUPP: 1.0000 | RECTAL | 0 refills | Status: DC | PRN

## 2022-09-16 MED ORDER — POLYETHYLENE GLYCOL 3350 17 G PO PACK: 17.0000 g | PACK | Freq: Two times a day (BID) | ORAL | 0 refills | Status: DC

## 2022-09-16 NOTE — Discharge Instructions (Signed)
Take medications MiraLAX and glycerin suppository for constipation as prescribed and continue to take your Macrobid antibiotic for urinary tract infection.  Follow-up with your doctor this week and return to the emergency department if you experience any new, worsening or unexpected symptoms.

## 2022-09-16 NOTE — ED Notes (Signed)
Attempted to call [facility]. Left message for Isabelle Course to call me back.

## 2022-09-16 NOTE — ED Provider Notes (Signed)
[facility] Provider Note    Event Date/Time   First MD Initiated Contact with Patient 09/16/22 [phone]     (approximate)   History   Weakness  HPI  Selena Pittman is a 72 y.o. female   Past medical history of breast cancer, bipolar, schizophrenia, thyroid dysfunction, recently diagnosed UTI 2 days ago on Macrobid, constipation who presents to the emergency department with abdominal cramping and bloating pain.  She had a bowel movement yesterday and is passing gas.  She was evaluated in the emergency department 2 days ago and diagnosed with UTI started on Macrobid and had a CT scan of her abdomen pelvis which showed constipation but no other acute surgical abdominal pathologies.  She reports no GI bleeding no fever no urinary symptoms.  She has been compliant with her medications.  External Medical Documents Reviewed: Emergency department note dated 09/14/2022 with the above      Physical Exam   Triage Vital Signs: ED Triage Vitals [09/16/22 0905]  Enc Vitals Group     BP 118/71     Pulse Rate 96     Resp 18     Temp 98.1 F (36.7 C)     Temp Source Oral     SpO2 94 %     Weight 110 lb (49.9 kg)     Height      Head Circumference      Peak Flow      Pain Score 0     Pain Loc      Pain Edu?      Excl. in GC?     Most recent vital signs: Vitals:   09/16/22 0905 09/16/22 1135  BP: 118/71 115/79  Pulse: 96 87  Resp: 18 15  Temp: 98.1 F (36.7 C)   SpO2: 94% 96%    General: Awake, no distress.  CV:  Good peripheral perfusion.  Resp:  Normal effort.  Abd:  No distention.  Other:  Soft no rigidity in the abdomen that she does have some mild tenderness left lower quadrant.  Skin appears warm well-perfused hemodynamics appropriate reassuring and afebrile, defer rectal exam    ED Results / Procedures / Treatments   Labs (all labs ordered are listed, but only abnormal results are displayed) Labs Reviewed  BASIC METABOLIC PANEL -  Abnormal; Notable for the following components:      Result Value   Glucose, Bld 107 (*)    All other components within normal limits  URINALYSIS, ROUTINE W REFLEX MICROSCOPIC - Abnormal; Notable for the following components:   Color, Urine AMBER (*)    APPearance HAZY (*)    Hgb urine dipstick SMALL (*)    Ketones, ur 20 (*)    Leukocytes,Ua MODERATE (*)    Bacteria, UA RARE (*)    Non Squamous Epithelial PRESENT (*)    All other components within normal limits  HEPATIC FUNCTION PANEL - Abnormal; Notable for the following components:   Total Protein 6.4 (*)    All other components within normal limits  URINE CULTURE  CBC  LIPASE, BLOOD     I ordered and reviewed the above labs they are notable for electrolytes and cell counts unremarkable, essentially unchanged from 2 days prior   RADIOLOGY I independently reviewed and interpreted CT of the abdomen pelvis see no obvious obstructive or inflammatory changes.   PROCEDURES:  Critical Care performed: No  Procedures   MEDICATIONS ORDERED IN ED: Medications - No data to display  IMPRESSION / MDM / ASSESSMENT AND PLAN / ED COURSE  I reviewed the triage vital signs and the nursing notes.                                Patient's presentation is most consistent with acute presentation with potential threat to life or bodily function.  Differential diagnosis includes, but is not limited to, constipation, perforated viscus, UTI, intra-abdominal infection   The patient is on the cardiac monitor to evaluate for evidence of arrhythmia and/or significant heart rate changes.  MDM: Patient with constipation recent CT scan showing the same, has made some bowel movements and continues to have cramping abdominal pain most likely consistent with her constipation that is already known tender on palpation today will obtain a CT scan to rule out perforated viscus or evolving infection.  She defers rectal exam and really prefers not to  have manual disimpaction as she has not had an adequate trial with bowel regimen at home yet.  Will prescribe MiraLAX and suppositories and she knows to come back with any worsening.  Will be discharged assuming that CT scan shows no emergency pathologies. Con't UTI med macrobid   Scan unremarkable patient stable, does not want rectal exam nor manual disimpaction at this time so given a bowel regimen and she will follow-up with PMD and return with any new or worsening symptoms.      FINAL CLINICAL IMPRESSION(S) / ED DIAGNOSES   Final diagnoses:  Abdominal cramping  Constipation, unspecified constipation type     Rx / DC Orders   ED Discharge Orders          Ordered    polyethylene glycol (MIRALAX) 17 g packet  2 times daily        09/16/22 1200    glycerin adult 2 g suppository  As needed        09/16/22 1200             Note:  This document was prepared using Dragon voice recognition software and may include unintentional dictation errors.    Pilar Jarvis, MD 09/16/22 [phone]

## 2022-09-16 NOTE — ED Triage Notes (Signed)
First Nurse Note:  Pt via [facility] from [facility] home. Pt c/o weakness, fatigue, and loss of appetite. Pt is currently on abx of recently dx UTI. Pt is A&Ox4 and NAD 112 CBG  98.2 oral  131/73 BP 97% on RA.

## 2022-09-16 NOTE — ED Notes (Signed)
Patient transported to CT 

## 2022-09-16 NOTE — ED Notes (Signed)
Pt assisted to bathroom, but she did not produce urine. Pt stated, "I thought I had to poop, but it was just gas."

## 2022-09-16 NOTE — ED Triage Notes (Signed)
Pt from [facility] for weakness. Pt states she has had intermittent nausea nd loss of appetite for several months. Pt states for last 2 weeks has been increasing weak. Pt with moist oral mucus membranes. Per ems pt is being treated for UTI as well. Pt is alert and oriented x4 and denies pain currently.

## 2022-09-17 LAB — URINE CULTURE: Culture: 80000 — AB

## 2022-10-01 ENCOUNTER — Emergency Department: Payer: Medicare Other

## 2022-10-01 ENCOUNTER — Encounter: Payer: Self-pay | Admitting: Emergency Medicine

## 2022-10-01 ENCOUNTER — Emergency Department
Admission: EM | Admit: 2022-10-01 | Discharge: 2022-10-01 | Disposition: A | Discharge status: Home / Self Care | Payer: Medicare Other | Attending: Emergency Medicine | Admitting: Emergency Medicine

## 2022-10-01 ENCOUNTER — Other Ambulatory Visit: Payer: Self-pay

## 2022-10-01 DIAGNOSIS — N390 Urinary tract infection, site not specified: Secondary | ICD-10-CM | POA: Diagnosis not present

## 2022-10-01 DIAGNOSIS — Z853 Personal history of malignant neoplasm of breast: Secondary | ICD-10-CM | POA: Insufficient documentation

## 2022-10-01 DIAGNOSIS — R531 Weakness: Secondary | ICD-10-CM | POA: Insufficient documentation

## 2022-10-01 DIAGNOSIS — R1032 Left lower quadrant pain: Secondary | ICD-10-CM | POA: Diagnosis present

## 2022-10-01 DIAGNOSIS — E860 Dehydration: Secondary | ICD-10-CM | POA: Insufficient documentation

## 2022-10-01 DIAGNOSIS — R110 Nausea: Secondary | ICD-10-CM | POA: Diagnosis not present

## 2022-10-01 LAB — COMPREHENSIVE METABOLIC PANEL
ALT: 19 U/L (ref 0–44)
AST: 20 U/L (ref 15–41)
Albumin: 4.1 g/dL (ref 3.5–5.0)
Alkaline Phosphatase: 90 U/L (ref 38–126)
Anion gap: 9 (ref 5–15)
BUN: 18 mg/dL (ref 8–23)
CO2: 26 mmol/L (ref 22–32)
Calcium: 9.2 mg/dL (ref 8.9–10.3)
Chloride: 106 mmol/L (ref 98–111)
Creatinine, Ser: 0.87 mg/dL (ref 0.44–1.00)
GFR, Estimated: >60 mL/min (ref >60)
Glucose, Bld: 107 mg/dL — ABNORMAL HIGH (ref 70–99)
Potassium: 3.7 mmol/L (ref 3.5–5.1)
Sodium: 141 mmol/L (ref 135–145)
Total Bilirubin: 0.7 mg/dL (ref 0.3–1.2)
Total Protein: 6.6 g/dL (ref 6.5–8.1)

## 2022-10-01 LAB — URINALYSIS, ROUTINE W REFLEX MICROSCOPIC
Bilirubin Urine: NEGATIVE
Glucose, UA: NEGATIVE mg/dL
Hgb urine dipstick: NEGATIVE
Ketones, ur: 5 mg/dL — AB
Nitrite: NEGATIVE
Protein, ur: NEGATIVE mg/dL
Specific Gravity, Urine: 1.016 (ref 1.005–1.030)
pH: 5 (ref 5.0–8.0)

## 2022-10-01 LAB — CBC
HCT: 42 % (ref 36.0–46.0)
Hemoglobin: 13.6 g/dL (ref 12.0–15.0)
MCH: 29.8 pg (ref 26.0–34.0)
MCHC: 32.4 g/dL (ref 30.0–36.0)
MCV: 91.9 fL (ref 80.0–100.0)
Platelets: 188 10*3/uL (ref 150–400)
RBC: 4.57 MIL/uL (ref 3.87–5.11)
RDW: 13.4 % (ref 11.5–15.5)
WBC: 5 10*3/uL (ref 4.0–10.5)
nRBC: 0 % (ref 0.0–0.2)

## 2022-10-01 LAB — LIPASE, BLOOD: Lipase: 28 U/L (ref 11–51)

## 2022-10-01 LAB — TROPONIN I (HIGH SENSITIVITY): Troponin I (High Sensitivity): 2 ng/L (ref <18)

## 2022-10-01 MED ORDER — SODIUM CHLORIDE 0.9 % IV BOLUS
500.0000 mL | Freq: Once | INTRAVENOUS | Status: AC
  Administered 2022-10-01: 500 mL via INTRAVENOUS

## 2022-10-01 MED ORDER — SODIUM CHLORIDE 0.9 % IV SOLN
1.0000 g | Freq: Once | INTRAVENOUS | Status: AC
  Administered 2022-10-01: 1 g via INTRAVENOUS
  Filled 2022-10-01: qty 10

## 2022-10-01 MED ORDER — IOHEXOL 300 MG/ML  SOLN
100.0000 mL | Freq: Once | INTRAMUSCULAR | Status: AC | PRN
  Administered 2022-10-01: 100 mL via INTRAVENOUS

## 2022-10-01 MED ORDER — KETOROLAC TROMETHAMINE 15 MG/ML IJ SOLN
15.0000 mg | Freq: Once | INTRAMUSCULAR | Status: AC
  Administered 2022-10-01: 15 mg via INTRAVENOUS
  Filled 2022-10-01: qty 1

## 2022-10-01 MED ORDER — ONDANSETRON HCL 4 MG/2ML IJ SOLN
4.0000 mg | Freq: Once | INTRAMUSCULAR | Status: AC
  Administered 2022-10-01: 4 mg via INTRAVENOUS
  Filled 2022-10-01: qty 2

## 2022-10-01 MED ORDER — CEFDINIR 300 MG PO CAPS: 300.0000 mg | ORAL_CAPSULE | Freq: Two times a day (BID) | ORAL | 0 refills | Status: AC

## 2022-10-01 MED ORDER — FAMOTIDINE IN NACL 20-0.9 MG/50ML-% IV SOLN
20.0000 mg | Freq: Once | INTRAVENOUS | Status: AC
  Administered 2022-10-01: 20 mg via INTRAVENOUS
  Filled 2022-10-01: qty 50

## 2022-10-01 NOTE — Discharge Instructions (Addendum)
You were seen in the emergency department for abdominal pain and generalized weakness.  You were diagnosed with a urinary tract infection.  You were given a dose of IV antibiotics in the emergency department.  You were started on oral antibiotics which she should take for the next 5 days.  Your urine was sent for culture.  You will be notified if the antibiotics you are started on need to be changed to a different antibiotic.  You have a CT scan done of your abdomen and pelvis that did not show any abnormalities to explain your symptoms today.  Given your recurrent episodes of urinary tract infections it is important that you discuss this with your primary care physician and discuss whether you need a referral and further evaluation with urologist.

## 2022-10-01 NOTE — ED Provider Notes (Signed)
[facility] Provider Note    Event Date/Time   First MD Initiated Contact with Patient 10/01/22 1014     (approximate)   History   Weakness and Abdominal Pain   HPI  Selena Pittman is a 72 y.o. female past medical history significant for prior breast cancer, recurrent UTIs, thyroid disorder, who presents to the emergency department with upset stomach.  Patient states that her stomach does not feel well.  States that this has been an ongoing issue for quite some time but it worsened today.  Endorses nausea and feels like she needed to throw up.  Complaining of diffuse abdominal pain that is worse in her left lower abdomen.  Denies any blood in her stool.  Denies any dysuria, urinary urgency or frequency.  Does not believe that she is received a prior endoscopy.     Physical Exam   Triage Vital Signs: ED Triage Vitals [10/01/22 0855]  Enc Vitals Group     BP 121/86     Pulse Rate (!) 104     Resp 18     Temp 98 F (36.7 C)     Temp Source Oral     SpO2 95 %     Weight 110 lb 0.2 oz (49.9 kg)     Height 5\' 4"  (1.626 m)     Head Circumference      Peak Flow      Pain Score 10     Pain Loc      Pain Edu?      Excl. in GC?     Most recent vital signs: Vitals:   10/01/22 0855  BP: 121/86  Pulse: (!) 104  Resp: 18  Temp: 98 F (36.7 C)  SpO2: 95%    Physical Exam Constitutional:      Appearance: She is well-developed.  HENT:     Head: Atraumatic.  Eyes:     Conjunctiva/sclera: Conjunctivae normal.  Cardiovascular:     Rate and Rhythm: Regular rhythm.  Pulmonary:     Effort: No respiratory distress.  Abdominal:     General: There is no distension.     Tenderness: There is abdominal tenderness in the right lower quadrant and left lower quadrant.  Musculoskeletal:        General: Normal range of motion.     Cervical back: Normal range of motion.  Skin:    General: Skin is warm.     Capillary Refill: Capillary refill takes less  than 2 seconds.  Neurological:     General: No focal deficit present.     Mental Status: She is alert. Mental status is at baseline.  Psychiatric:        Mood and Affect: Mood normal.     IMPRESSION / MDM / ASSESSMENT AND PLAN / ED COURSE  I reviewed the triage vital signs and the nursing notes.  Differential diagnosis including urinary tract infection, pyelonephritis, gastritis/PUD, diverticulitis, malignancy, kidney stones   EKG  I, Corena Herter, the attending physician, personally viewed and interpreted this ECG.   Rate: 106  Rhythm: Sinus tachycardia  Axis: Normal  Intervals: Normal  ST&T Change: None  No tachycardic or bradycardic dysrhythmias while on cardiac telemetry.  RADIOLOGY I independently reviewed imaging, my interpretation of imaging: CT abdomen and pelvis with contrast   LABS (all labs ordered are listed, but only abnormal results are displayed) Labs interpreted as -    Labs Reviewed  URINALYSIS, ROUTINE W REFLEX MICROSCOPIC - Abnormal;  Notable for the following components:      Result Value   Color, Urine YELLOW (*)    APPearance HAZY (*)    Ketones, ur 5 (*)    Leukocytes,Ua SMALL (*)    Bacteria, UA FEW (*)    All other components within normal limits  COMPREHENSIVE METABOLIC PANEL - Abnormal; Notable for the following components:   Glucose, Bld 107 (*)    All other components within normal limits  URINE CULTURE  CBC  LIPASE, BLOOD  TSH  T4, FREE  CBG MONITORING, ED  TROPONIN I (HIGH SENSITIVITY)     MDM  No significant anemia.  Creatinine at baseline.  No significant electrolyte abnormalities.  Glucose within normal limits.  Initial troponin negative.  Plan for CT scan abdomen and pelvis with contrast  Treated with IV fluids, IV antiemetics, IV ketorolac and famotidine  Findings concerning for urinary tract infection.  Patient does have a history of frequent UTIs.  On review of her last urine culture, was sensitive to  cephalosporins.  Patient was given dose of IV Rocephin  Clinical Course as of 10/01/22 1250  Thu Oct 01, 2022  1137 Patient refusing CT scan.  Discussed with her son who stated that he was her legal guardian, Selena Pittman, he does wish for her to have a CT scan done.  Discussed findings of urinary tract infection, sending a urine culture, starting on antibiotics and likely need to follow-up with possibly a urologist given her frequent urinary tract infections and need to further workup as an outpatient.  Patient amenable to CT scan. [SM]    Clinical Course User Index [SM] Corena Herter, MD   CT scan without findings of acute intra-abdominal pathology.  On reevaluation continues to have benign abdominal exam.  Tolerating p.o.  Patient was discharged on an oral antibiotic.  Urine was sent for culture.  Discussed follow-up and return precautions with her son over the phone.  PROCEDURES:  Critical Care performed: No  Procedures  Patient's presentation is most consistent with acute presentation with potential threat to life or bodily function.   MEDICATIONS ORDERED IN ED: Medications  ketorolac (TORADOL) 15 MG/ML injection 15 mg (15 mg Intravenous Given 10/01/22 1105)  sodium chloride 0.9 % bolus 500 mL (500 mLs Intravenous New Bag/Given 10/01/22 1107)  ondansetron (ZOFRAN) injection 4 mg (4 mg Intravenous Given 10/01/22 1105)  famotidine (PEPCID) IVPB 20 mg premix (0 mg Intravenous Stopped 10/01/22 1204)  cefTRIAXone (ROCEPHIN) 1 g in sodium chloride 0.9 % 100 mL IVPB (1 g Intravenous New Bag/Given 10/01/22 1215)  iohexol (OMNIPAQUE) 300 MG/ML solution 100 mL (100 mLs Intravenous Contrast Given 10/01/22 1136)    FINAL CLINICAL IMPRESSION(S) / ED DIAGNOSES   Final diagnoses:  Lower urinary tract infectious disease  Weakness  Dehydration     Rx / DC Orders   ED Discharge Orders          Ordered    cefdinir (OMNICEF) 300 MG capsule  2 times daily        10/01/22 1248              Note:  This document was prepared using Dragon voice recognition software and may include unintentional dictation errors.   Corena Herter, MD 10/01/22 1250

## 2022-10-01 NOTE — ED Notes (Signed)
First Nurse Note: Patient to ED via [facility] from group home [facility]). Patient has been increasingly more weak over the past few days. Dc'd Monday for UTI but getting increasingly worse. Hx of schizophrenia

## 2022-10-01 NOTE — ED Triage Notes (Signed)
Pt here from a group home with weakness. Pt normally is up and ambulating, has not been ambulating today. Pt states she does feel weak and that she is having some abd pain as well. Pt denies fall or injury. Pt has hx of schizophrenia.

## 2022-10-03 LAB — URINE CULTURE

## 2022-10-04 LAB — URINE CULTURE: Culture: 60000 — AB

## 2022-10-29 ENCOUNTER — Inpatient Hospital Stay
Admission: EM | Admit: 2022-10-29 | Discharge: 2022-11-04 | DRG: 871 | Disposition: A | Discharge status: Skilled Nursing Facility | Payer: Medicare Other | Attending: Hospitalist | Admitting: Hospitalist

## 2022-10-29 ENCOUNTER — Emergency Department: Payer: Medicare Other

## 2022-10-29 ENCOUNTER — Other Ambulatory Visit: Payer: Self-pay

## 2022-10-29 DIAGNOSIS — E785 Hyperlipidemia, unspecified: Secondary | ICD-10-CM | POA: Diagnosis present

## 2022-10-29 DIAGNOSIS — Z8049 Family history of malignant neoplasm of other genital organs: Secondary | ICD-10-CM

## 2022-10-29 DIAGNOSIS — J189 Pneumonia, unspecified organism: Secondary | ICD-10-CM | POA: Diagnosis not present

## 2022-10-29 DIAGNOSIS — F259 Schizoaffective disorder, unspecified: Secondary | ICD-10-CM | POA: Diagnosis not present

## 2022-10-29 DIAGNOSIS — R531 Weakness: Secondary | ICD-10-CM | POA: Diagnosis not present

## 2022-10-29 DIAGNOSIS — Z853 Personal history of malignant neoplasm of breast: Secondary | ICD-10-CM

## 2022-10-29 DIAGNOSIS — C50919 Malignant neoplasm of unspecified site of unspecified female breast: Secondary | ICD-10-CM

## 2022-10-29 DIAGNOSIS — Z8249 Family history of ischemic heart disease and other diseases of the circulatory system: Secondary | ICD-10-CM | POA: Diagnosis not present

## 2022-10-29 DIAGNOSIS — L409 Psoriasis, unspecified: Secondary | ICD-10-CM

## 2022-10-29 DIAGNOSIS — Z1152 Encounter for screening for COVID-19: Secondary | ICD-10-CM

## 2022-10-29 DIAGNOSIS — A419 Sepsis, unspecified organism: Principal | ICD-10-CM | POA: Diagnosis present

## 2022-10-29 DIAGNOSIS — Z7983 Long term (current) use of bisphosphonates: Secondary | ICD-10-CM | POA: Diagnosis not present

## 2022-10-29 DIAGNOSIS — M810 Age-related osteoporosis without current pathological fracture: Secondary | ICD-10-CM | POA: Diagnosis present

## 2022-10-29 DIAGNOSIS — Z79811 Long term (current) use of aromatase inhibitors: Secondary | ICD-10-CM | POA: Diagnosis not present

## 2022-10-29 DIAGNOSIS — Z9012 Acquired absence of left breast and nipple: Secondary | ICD-10-CM | POA: Diagnosis not present

## 2022-10-29 DIAGNOSIS — F319 Bipolar disorder, unspecified: Secondary | ICD-10-CM | POA: Diagnosis present

## 2022-10-29 DIAGNOSIS — G8929 Other chronic pain: Secondary | ICD-10-CM | POA: Diagnosis not present

## 2022-10-29 DIAGNOSIS — F17210 Nicotine dependence, cigarettes, uncomplicated: Secondary | ICD-10-CM | POA: Diagnosis not present

## 2022-10-29 DIAGNOSIS — J439 Emphysema, unspecified: Secondary | ICD-10-CM | POA: Diagnosis present

## 2022-10-29 DIAGNOSIS — Z79899 Other long term (current) drug therapy: Secondary | ICD-10-CM | POA: Diagnosis not present

## 2022-10-29 DIAGNOSIS — K589 Irritable bowel syndrome without diarrhea: Secondary | ICD-10-CM | POA: Diagnosis present

## 2022-10-29 DIAGNOSIS — R109 Unspecified abdominal pain: Secondary | ICD-10-CM | POA: Diagnosis not present

## 2022-10-29 DIAGNOSIS — R6520 Severe sepsis without septic shock: Secondary | ICD-10-CM | POA: Diagnosis present

## 2022-10-29 DIAGNOSIS — K310 Acute dilatation of stomach: Secondary | ICD-10-CM | POA: Clinically undetermined

## 2022-10-29 DIAGNOSIS — K219 Gastro-esophageal reflux disease without esophagitis: Secondary | ICD-10-CM | POA: Diagnosis present

## 2022-10-29 DIAGNOSIS — Z91048 Other nonmedicinal substance allergy status: Secondary | ICD-10-CM | POA: Diagnosis not present

## 2022-10-29 DIAGNOSIS — K582 Mixed irritable bowel syndrome: Secondary | ICD-10-CM | POA: Diagnosis present

## 2022-10-29 DIAGNOSIS — C50912 Malignant neoplasm of unspecified site of left female breast: Secondary | ICD-10-CM | POA: Diagnosis present

## 2022-10-29 LAB — CBC
HCT: 36.6 % (ref 36.0–46.0)
Hemoglobin: 12 g/dL (ref 12.0–15.0)
MCH: 30.5 pg (ref 26.0–34.0)
MCHC: 32.8 g/dL (ref 30.0–36.0)
MCV: 92.9 fL (ref 80.0–100.0)
Platelets: 167 10*3/uL (ref 150–400)
RBC: 3.94 MIL/uL (ref 3.87–5.11)
RDW: 13.5 % (ref 11.5–15.5)
WBC: 10.4 10*3/uL (ref 4.0–10.5)
nRBC: 0 % (ref 0.0–0.2)

## 2022-10-29 LAB — LIPASE, BLOOD: Lipase: 25 U/L (ref 11–51)

## 2022-10-29 LAB — COMPREHENSIVE METABOLIC PANEL
ALT: 23 U/L (ref 0–44)
AST: 32 U/L (ref 15–41)
Albumin: 3.8 g/dL (ref 3.5–5.0)
Alkaline Phosphatase: 107 U/L (ref 38–126)
Anion gap: 9 (ref 5–15)
BUN: 10 mg/dL (ref 8–23)
CO2: 26 mmol/L (ref 22–32)
Calcium: 8.9 mg/dL (ref 8.9–10.3)
Chloride: 107 mmol/L (ref 98–111)
Creatinine, Ser: 0.88 mg/dL (ref 0.44–1.00)
GFR, Estimated: >60 mL/min (ref >60)
Glucose, Bld: 138 mg/dL — ABNORMAL HIGH (ref 70–99)
Potassium: 3.5 mmol/L (ref 3.5–5.1)
Sodium: 142 mmol/L (ref 135–145)
Total Bilirubin: 0.7 mg/dL (ref 0.3–1.2)
Total Protein: 6.3 g/dL — ABNORMAL LOW (ref 6.5–8.1)

## 2022-10-29 LAB — CBC WITH DIFFERENTIAL/PLATELET
Abs Immature Granulocytes: 0.04 10*3/uL (ref 0.00–0.07)
Basophils Absolute: 0 10*3/uL (ref 0.0–0.1)
Basophils Relative: 0 %
Eosinophils Absolute: 0 10*3/uL (ref 0.0–0.5)
Eosinophils Relative: 0 %
HCT: 41.4 % (ref 36.0–46.0)
Hemoglobin: 13.6 g/dL (ref 12.0–15.0)
Immature Granulocytes: 0 %
Lymphocytes Relative: 1 %
Lymphs Abs: 0.1 10*3/uL — ABNORMAL LOW (ref 0.7–4.0)
MCH: 30.3 pg (ref 26.0–34.0)
MCHC: 32.9 g/dL (ref 30.0–36.0)
MCV: 92.2 fL (ref 80.0–100.0)
Monocytes Absolute: 0.2 10*3/uL (ref 0.1–1.0)
Monocytes Relative: 3 %
Neutro Abs: 9.3 10*3/uL — ABNORMAL HIGH (ref 1.7–7.7)
Neutrophils Relative %: 96 %
Platelets: 172 10*3/uL (ref 150–400)
RBC: 4.49 MIL/uL (ref 3.87–5.11)
RDW: 13.6 % (ref 11.5–15.5)
WBC: 9.8 10*3/uL (ref 4.0–10.5)
nRBC: 0 % (ref 0.0–0.2)

## 2022-10-29 LAB — URINALYSIS, W/ REFLEX TO CULTURE (INFECTION SUSPECTED)
Bacteria, UA: NONE SEEN
Bilirubin Urine: NEGATIVE
Glucose, UA: NEGATIVE mg/dL
Ketones, ur: NEGATIVE mg/dL
Leukocytes,Ua: NEGATIVE
Nitrite: NEGATIVE
Protein, ur: NEGATIVE mg/dL
Specific Gravity, Urine: 1.017 (ref 1.005–1.030)
Squamous Epithelial / HPF: NONE SEEN /HPF (ref 0–5)
pH: 5 (ref 5.0–8.0)

## 2022-10-29 LAB — TSH: TSH: 0.595 u[IU]/mL (ref 0.350–4.500)

## 2022-10-29 LAB — CREATININE, SERUM
Creatinine, Ser: 0.67 mg/dL (ref 0.44–1.00)
GFR, Estimated: >60 mL/min (ref >60)

## 2022-10-29 LAB — LACTIC ACID, PLASMA
Lactic Acid, Venous: 2.9 mmol/L (ref 0.5–1.9)
Lactic Acid, Venous: 1.8 mmol/L (ref 0.5–1.9)

## 2022-10-29 LAB — TROPONIN I (HIGH SENSITIVITY)
Troponin I (High Sensitivity): 4 ng/L (ref <18)
Troponin I (High Sensitivity): 4 ng/L (ref <18)

## 2022-10-29 LAB — MAGNESIUM: Magnesium: 2 mg/dL (ref 1.7–2.4)

## 2022-10-29 LAB — SARS CORONAVIRUS 2 BY RT PCR: SARS Coronavirus 2 by RT PCR: NEGATIVE

## 2022-10-29 MED ORDER — MONTELUKAST SODIUM 10 MG PO TABS
5.0000 mg | ORAL_TABLET | Freq: Every day | ORAL | Status: DC
  Administered 2022-10-29 – 2022-11-03 (×6): 5 mg via ORAL
  Filled 2022-10-29 (×6): qty 1

## 2022-10-29 MED ORDER — ACETAMINOPHEN 650 MG RE SUPP: 650.0000 mg | Freq: Four times a day (QID) | RECTAL | Status: DC | PRN

## 2022-10-29 MED ORDER — SODIUM CHLORIDE 0.9 % IV SOLN
500.0000 mg | INTRAVENOUS | Status: DC
  Administered 2022-10-29 – 2022-10-30 (×2): 500 mg via INTRAVENOUS
  Filled 2022-10-29 (×3): qty 5

## 2022-10-29 MED ORDER — CLOZAPINE 100 MG PO TABS
200.0000 mg | ORAL_TABLET | Freq: Every day | ORAL | Status: DC
  Administered 2022-10-29 – 2022-11-03 (×6): 200 mg via ORAL
  Filled 2022-10-29 (×6): qty 2

## 2022-10-29 MED ORDER — SODIUM CHLORIDE 0.9 % IV SOLN
2.0000 g | INTRAVENOUS | Status: DC
  Administered 2022-10-29: 2 g via INTRAVENOUS
  Filled 2022-10-29: qty 20

## 2022-10-29 MED ORDER — IOHEXOL 350 MG/ML SOLN
75.0000 mL | Freq: Once | INTRAVENOUS | Status: AC | PRN
  Administered 2022-10-29: 75 mL via INTRAVENOUS

## 2022-10-29 MED ORDER — SODIUM CHLORIDE 0.9 % IV SOLN
1.0000 g | INTRAVENOUS | Status: DC
  Administered 2022-10-30: 1 g via INTRAVENOUS
  Filled 2022-10-29 (×2): qty 10

## 2022-10-29 MED ORDER — SODIUM CHLORIDE 0.9 % IV BOLUS
1000.0000 mL | Freq: Once | INTRAVENOUS | Status: AC
  Administered 2022-10-29: 1000 mL via INTRAVENOUS

## 2022-10-29 MED ORDER — DM-GUAIFENESIN ER 30-600 MG PO TB12
1.0000 | ORAL_TABLET | Freq: Two times a day (BID) | ORAL | Status: DC
  Administered 2022-10-29 – 2022-11-03 (×11): 1 via ORAL
  Filled 2022-10-29 (×11): qty 1

## 2022-10-29 MED ORDER — LETROZOLE 2.5 MG PO TABS
2.5000 mg | ORAL_TABLET | Freq: Every day | ORAL | Status: DC
  Administered 2022-10-29 – 2022-11-04 (×7): 2.5 mg via ORAL
  Filled 2022-10-29 (×7): qty 1

## 2022-10-29 MED ORDER — PANTOPRAZOLE SODIUM 40 MG PO TBEC
40.0000 mg | DELAYED_RELEASE_TABLET | Freq: Every day | ORAL | Status: DC
  Administered 2022-10-29 – 2022-11-04 (×7): 40 mg via ORAL
  Filled 2022-10-29 (×7): qty 1

## 2022-10-29 MED ORDER — ONDANSETRON HCL 4 MG PO TABS
4.0000 mg | ORAL_TABLET | Freq: Four times a day (QID) | ORAL | Status: DC | PRN
  Administered 2022-10-30 – 2022-11-02 (×2): 4 mg via ORAL
  Filled 2022-10-29 (×2): qty 1

## 2022-10-29 MED ORDER — ACETAMINOPHEN 325 MG PO TABS
650.0000 mg | ORAL_TABLET | Freq: Four times a day (QID) | ORAL | Status: DC | PRN
  Administered 2022-10-29 – 2022-11-03 (×8): 650 mg via ORAL
  Filled 2022-10-29 (×8): qty 2

## 2022-10-29 MED ORDER — GLYCERIN (ADULT) 2 G RE SUPP: 1.0000 | RECTAL | Status: DC | PRN

## 2022-10-29 MED ORDER — ENOXAPARIN SODIUM 40 MG/0.4ML IJ SOSY
40.0000 mg | PREFILLED_SYRINGE | INTRAMUSCULAR | Status: DC
  Administered 2022-10-29 – 2022-11-03 (×6): 40 mg via SUBCUTANEOUS
  Filled 2022-10-29 (×6): qty 0.4

## 2022-10-29 MED ORDER — SODIUM CHLORIDE 0.9 % IV SOLN: 500.0000 mg | INTRAVENOUS | Status: DC

## 2022-10-29 MED ORDER — ALBUTEROL SULFATE (2.5 MG/3ML) 0.083% IN NEBU: 3.0000 mL | INHALATION_SOLUTION | RESPIRATORY_TRACT | Status: DC | PRN

## 2022-10-29 MED ORDER — LORATADINE 10 MG PO TABS
10.0000 mg | ORAL_TABLET | Freq: Every day | ORAL | Status: DC
  Administered 2022-10-29 – 2022-11-04 (×7): 10 mg via ORAL
  Filled 2022-10-29 (×7): qty 1

## 2022-10-29 MED ORDER — POLYETHYLENE GLYCOL 3350 17 G PO PACK
17.0000 g | PACK | Freq: Two times a day (BID) | ORAL | Status: DC
  Administered 2022-10-29 – 2022-10-31 (×5): 17 g via ORAL
  Filled 2022-10-29 (×5): qty 1

## 2022-10-29 MED ORDER — ONDANSETRON HCL 4 MG/2ML IJ SOLN: 4.0000 mg | Freq: Four times a day (QID) | INTRAMUSCULAR | Status: DC | PRN

## 2022-10-29 MED ORDER — SIMVASTATIN 20 MG PO TABS
20.0000 mg | ORAL_TABLET | Freq: Every day | ORAL | Status: DC
  Administered 2022-10-29 – 2022-11-03 (×6): 20 mg via ORAL
  Filled 2022-10-29 (×6): qty 1

## 2022-10-29 MED ORDER — LINACLOTIDE 145 MCG PO CAPS
145.0000 ug | ORAL_CAPSULE | Freq: Every day | ORAL | Status: DC
  Administered 2022-10-29 – 2022-11-04 (×7): 145 ug via ORAL
  Filled 2022-10-29 (×7): qty 1

## 2022-10-29 MED ORDER — LACTATED RINGERS IV SOLN: INTRAVENOUS | Status: DC

## 2022-10-29 MED ORDER — FLUVOXAMINE MALEATE 50 MG PO TABS
50.0000 mg | ORAL_TABLET | Freq: Two times a day (BID) | ORAL | Status: DC
  Administered 2022-10-29 – 2022-11-04 (×12): 50 mg via ORAL
  Filled 2022-10-29 (×13): qty 1

## 2022-10-29 MED ORDER — HYDROXYZINE HCL 50 MG PO TABS
25.0000 mg | ORAL_TABLET | Freq: Two times a day (BID) | ORAL | Status: DC
  Administered 2022-10-29 – 2022-11-04 (×12): 25 mg via ORAL
  Filled 2022-10-29 (×12): qty 1

## 2022-10-29 MED ORDER — DOCUSATE SODIUM 100 MG PO CAPS
100.0000 mg | ORAL_CAPSULE | Freq: Two times a day (BID) | ORAL | Status: DC
  Administered 2022-10-29 – 2022-10-31 (×5): 100 mg via ORAL
  Filled 2022-10-29 (×5): qty 1

## 2022-10-29 MED ORDER — VITAMIN D 25 MCG (1000 UNIT) PO TABS
2000.0000 [IU] | ORAL_TABLET | Freq: Every day | ORAL | Status: DC
  Administered 2022-10-29 – 2022-11-04 (×7): 2000 [IU] via ORAL
  Filled 2022-10-29 (×7): qty 2

## 2022-10-29 NOTE — Assessment & Plan Note (Signed)
Continue home letrozole

## 2022-10-29 NOTE — Progress Notes (Signed)
CODE SEPSIS - PHARMACY COMMUNICATION  **Broad Spectrum Antibiotics should be administered within 1 hour of Sepsis diagnosis**  Time Code Sepsis Called/Page Received: 1028  Antibiotics Ordered: Ceftriaxone + azithromycin  Time of 1st antibiotic administration: 1038  Additional action taken by pharmacy: N/A  Selena Pittman 10/29/2022  10:29 AM

## 2022-10-29 NOTE — ED Notes (Signed)
Patient ate lunch tray, assisted on and off bed pan and adjusted in bed.

## 2022-10-29 NOTE — Progress Notes (Signed)
Pharmacy Consult - Clozapine     72 yo F ordered clozapine 200 mg PO qHS  This patient's order has been reviewed for prescribing contraindications.    Clozapine [facility] enrollment Verified: Yes DATE: 10/29/22 [facility] patient ID: [id number] [facility] Dispense Authorization (RDA): [id number] Current Outpatient Monitoring: Monthly    Home Regimen: 200 mg PO qHS Last dose: 10/28/22   Dose Adjustments This Admission: N/A   Labs: Date    ANC    Submitted? 7/11 9300 Yes       Plan: Continue clozapine 200 mg qHS Monitor ANC at least weekly while inpatient

## 2022-10-29 NOTE — Assessment & Plan Note (Addendum)
Chronic constipation Continue home meds - Linzess, Colace, Miralax BID, glycerin suppositories PRN,  Trial of Bentyl started 7/14: made Colace & Miralax PRN due to diarrhea reported

## 2022-10-29 NOTE — ED Notes (Signed)
Patient assisted on and off bedpan.

## 2022-10-29 NOTE — H&P (Signed)
History and Physical    Patient: Selena Pittman WGN:[id number] DOB: 1951/03/07 DOA: 10/29/2022 DOS: the patient was seen and examined on 10/29/2022 PCP: Armando Gang, FNP  Patient coming from:  group home  Chief Complaint:  Chief Complaint  Patient presents with   Abdominal Pain   HPI: Selena Pittman is a 72 y.o. female with medical history significant of bipolar affective disorder, schizophrenia, depression, hyperlipidemia who presents to the ED for evaluation today after home health nurse found the patient to have progressively worsening generalized weakness and fatigue.  Patient reportedly had a fall and it was unknown whether or not she hit her head or had any loss of consciousness.  Not on blood thinners.  Patient reports having a cough for about a week.  Denies fevers or chills.  She reports some abdominal discomfort but no nausea vomiting or diarrhea.  She is prone to chronic constipation.  Patient is a poor historian, further history is limited.    On chart review, patient was admitted in December 2023 with a lung mass that was concerning for necrotic pneumonia based on biopsy positive for abscess features.  She  was treated with a long course of Bactrim and Augmentin per infectious disease.  ED course -  tachycardic (HR 110), tachypneic (RR 20-24), normal but soft BP 105/73, O2 sat 98% on room air, afebrile. Labs were obtained including CMP and CBC that were notable only for nonfasting glucose 138, total protein 6.3.  Lactic acid initially elevated 2.9.  Urinalysis was negative for signs of infection. Imaging -CT head no acute findings but showed extensive chronic microvascular ischemic changes.  Chest x-ray showed right-sided patchy infiltrates concerning for multifocal pneumonia.  CTA chest was negative for PE, showed multifocal right-sided centrilobular groundglass opacities which are nonspecific but could reflect atypical infection versus aspiration.  Esophagus appeared  patulous with layering internal fluid which increases risk of aspiration.  She was treated in the ED with IV antibiotics and admitted to the hospital for further evaluation management of severe sepsis due to multifocal pneumonia    Review of Systems: As mentioned in the history of present illness. All other systems reviewed and are negative.   Past Medical History:  Diagnosis Date   Bipolar affective (HCC)    Breast cancer (HCC)    Cancer (HCC) 12-11-14   INVASIVE MAMMARY CARCINOMA/.left breast/ T2 N0   Cardiomegaly    Depression    Emphysema of lung (HCC)    Endometriosis    Hyperlipidemia    Osteoporosis    Schizoaffective disorder (HCC)    Schizophrenia (HCC)    Thyroid nodule    Vitamin D deficiency    Past Surgical History:  Procedure Laterality Date   ABDOMINAL HYSTERECTOMY     BREAST BIOPSY Left 12-11-14   INVASIVE MAMMARY CARCINOMA.   BREAST LUMPECTOMY WITH SENTINEL LYMPH NODE BIOPSY Left 12/31/2014   Procedure: LEFT BREAST LUMPECTOMY WITH ULTRASOUND GUIDED NEEDLE LOCALIZATION, SENTINEL LYMPH NODE BIOPSY ;  Surgeon: Kieth Brightly, MD;  Location: [facility] ORS;  Service: General;  Laterality: Left;   DIAGNOSTIC MAMMOGRAM  12/04/2014   Done at [facility] Category 5-Left Breast   DILATION AND CURETTAGE OF UTERUS     MASTECTOMY Left 2016   SIMPLE MASTECTOMY WITH AXILLARY SENTINEL NODE BIOPSY Left 01/14/2015   Procedure: SIMPLE MASTECTOMY;  Surgeon: Kieth Brightly, MD;  Location: [facility] ORS;  Service: General;  Laterality: Left;   TENDON REPAIR Right    hand   TONSILLECTOMY  TUBAL LIGATION     Social History:  reports that she has been smoking cigarettes. She has a 45 pack-year smoking history. She has never used smokeless tobacco. She reports that she does not drink alcohol and does not use drugs.  Allergies  Allergen Reactions   Cat Hair Extract    Milk-Related Compounds    Mixed Ragweed    Peanut-Containing Drug Products     Family History  Problem  Relation Age of Onset   Cancer Mother        uterine   Heart attack Father    Breast cancer Neg Hx     Prior to Admission medications   Medication Sig Start Date End Date Taking? Authorizing Provider  alendronate (FOSAMAX) 70 MG tablet Take 70 mg by mouth once a week. Take with a full glass of water on an empty stomach.    [provider]  cetirizine (ZYRTEC) 10 MG tablet Take 10 mg by mouth daily.    [provider]  cholecalciferol (VITAMIN D) 1000 UNITS tablet Take 2,000 Units by mouth daily.    [provider]  cloZAPine (CLOZARIL) 100 MG tablet Take 400 mg by mouth daily.    [provider]  coal tar-salicylic acid 2 % shampoo Use to wash scalp Tuesday, Thursday and Saturdays. 12/11/19   Deirdre Evener, MD  docusate sodium (COLACE) 100 MG capsule Take 100 mg by mouth 2 (two) times daily. 10/09/21   [provider]  glycerin adult 2 g suppository Place 1 suppository rectally as needed for constipation. 09/16/22   Pilar Jarvis, MD  HYDROcodone-acetaminophen (NORCO) 5-325 MG tablet Take 1 tablet by mouth every 4 (four) hours as needed for moderate pain. 07/15/22   Merwyn Katos, MD  hydrOXYzine (VISTARIL) 25 MG capsule Take 25 mg by mouth 2 (two) times daily. 07/06/18   [provider]  ketoconazole (NIZORAL) 2 % shampoo APPLY TO AFFECTED AREA TWICE A WEEK FOR 8 WEEKS; THEN USE AS NEEDED. 09/10/20   Deirdre Evener, MD  lactulose (CEPHULAC) 10 g packet Take 1 packet (10 g total) by mouth daily. 09/14/22   Georga Hacking, MD  letrozole [facility]) 2.5 MG tablet Take 1 tablet (2.5 mg total) by mouth daily. 08/07/15   Johney Maine, MD  LINZESS 145 MCG CAPS capsule Take 145 mcg by mouth daily.    [provider]  montelukast (SINGULAIR) 5 MG chewable tablet Chew 5 mg by mouth at bedtime. 10/10/21   [provider]  omeprazole (PRILOSEC) 20 MG capsule Take 20 mg by mouth daily.    [provider]  polyethylene glycol  (MIRALAX / GLYCOLAX) 17 g packet Take 17 g by mouth daily. 04/13/22   Rodolph Bong, MD  polyethylene glycol (MIRALAX) 17 g packet Take 17 g by mouth 2 (two) times daily. 09/16/22   Pilar Jarvis, MD  simvastatin (ZOCOR) 20 MG tablet Take 20 mg by mouth daily.    [provider]    Physical Exam: Vitals:   10/29/22 1330 10/29/22 1430 10/29/22 1500 10/29/22 1630  BP: 102/68 107/68 104/65 116/81  Pulse: 100 (!) 102 99 88  Resp: (!) 21 19 (!) 25 20  Temp:   98.2 F (36.8 C)   TempSrc:   Oral   SpO2: 100% 100% 100% 95%  Weight:       General exam: awake, alert, no acute distress HEENT: atraumatic, clear conjunctiva, anicteric sclera, moist mucus membranes, hearing grossly normal  Respiratory system: Right-sided  rhonchi, left side clear but diminished, no wheezes, normal respiratory effort at rest, on room air. Cardiovascular system: normal S1/S2, RRR, no JVD, murmurs, rubs, gallops, no pedal edema.   Gastrointestinal system: soft, NT, ND, no HSM felt, +bowel sounds. Central nervous system: A&O x self and place at least. no gross focal neurologic deficits, normal speech Extremities: moves all , no edema, normal tone Skin: dry, intact, normal temperature, normal color, No rashes, lesions or ulcers seen on visualized skin Psychiatry: normal mood, congruent affect, judgement and insight appear normal  Data Reviewed:  Notable labs as outlined above  Assessment and Plan:  * Multifocal pneumonia Right-sided patchy infiltrates on the chest x-ray and CTA chest.  Presented with cough x 1 week, progressive generalized weakness and fatigue. --IV Rocephin and Zithromax --Scheduled Mucinex --Flutter valve --Supplement O2 as needed to keep sats above 90%  Severe sepsis (HCC) POA due to multifocal pneumonia with HR 110, RR 24, lactic acid elevated at 2.9 consistent with organ dysfunction and severe sepsis. --Treated per sepsis protocol in the ED with IV antibiotics and fluids --  Continue IV antibiotics as outlined --Follow cultures --Trend lactate Monitor fever curve, CBC  IBS (irritable colon syndrome) Chronic constipation Continue home meds - Linzess, Colace, Miralax BID, glycerin suppositories PRN,   Hyperlipidemia Continue simvastatin  Bipolar affective disorder (HCC) Continue home Clozaril, Luvox, Atarax  GERD (gastroesophageal reflux disease) Continue PPI  Malignant neoplasm of left female breast (HCC) Continue home letrozole      Advance Care Planning: CODE STATUS - full code  Consults: None  Family Communication: None present, will attempt to call daughter  Severity of Illness: The appropriate patient status for this patient is OBSERVATION. Observation status is judged to be reasonable and necessary in order to provide the required intensity of service to ensure the patient's safety. The patient's presenting symptoms, physical exam findings, and initial radiographic and laboratory data in the context of their medical condition is felt to place them at decreased risk for further clinical deterioration. Furthermore, it is anticipated that the patient will be medically stable for discharge from the hospital within 2 midnights of admission.   Author: Pennie Banter, DO 10/29/2022 4:55 PM  For on call review [email or web address].

## 2022-10-29 NOTE — Assessment & Plan Note (Addendum)
Right-sided patchy infiltrates on the chest x-ray and CTA chest.  Presented with cough x 1 week, progressive generalized weakness and fatigue. --Treated with IV Rocephin and Zithromax --Completing PO Omnicef & Zithromax today (7/15) --Scheduled Mucinex --Flutter valve --Supplement O2 as needed to keep sats above 90%

## 2022-10-29 NOTE — ED Triage Notes (Signed)
Home health called EMS for generalized weakness, patient complaining of abdominal pain 8/10.

## 2022-10-29 NOTE — Assessment & Plan Note (Signed)
Continue simvastatin. 

## 2022-10-29 NOTE — ED Notes (Signed)
[facility] called to check on patient, would like a call back at [phone] when room is assigned.

## 2022-10-29 NOTE — Sepsis Progress Note (Signed)
[facility] will follow per sepsis protocol  

## 2022-10-29 NOTE — ED Provider Notes (Signed)
[facility] Provider Note    Event Date/Time   First MD Initiated Contact with Patient 10/29/22 [phone]     (approximate)   History   Abdominal Pain   HPI  Selena Pittman is a 72 y.o. female past medical history significant for prior breast cancer, recurrent urinary tract infections, thyroid disorder, schizophrenia, who presents to the emergency department with generalized weakness.  Patient has home health and the saw her today and had her come to the emergency department for progressively worsening generalized weakness.  Unable to ambulate today and had a fall.  Uncertain if there was head injury or loss of consciousness.  Not on anticoagulation.  Patient complaining of some abdominal pain.  Patient does have chronic abdominal pain.  States that she feels weak all over.  Does state that she fell.  Denies any nausea or vomiting.  Does endorse a recent cough.  Patient's son wanted to be notified if refusing any test.     Physical Exam   Triage Vital Signs: ED Triage Vitals  Encounter Vitals Group     BP      Systolic BP Percentile      Diastolic BP Percentile      Pulse      Resp      Temp      Temp src      SpO2      Weight      Height      Head Circumference      Peak Flow      Pain Score      Pain Loc      Pain Education      Exclude from Growth Chart     Most recent vital signs: Vitals:   10/29/22 1000 10/29/22 1030  BP: 111/80 112/77  Pulse: 100 98  Resp:  (!) 24  Temp:    SpO2: 100% 100%    Physical Exam Constitutional:      Appearance: She is well-developed.  HENT:     Head: Atraumatic.  Eyes:     Conjunctiva/sclera: Conjunctivae normal.  Cardiovascular:     Rate and Rhythm: Regular rhythm.  Pulmonary:     Effort: No respiratory distress.     Breath sounds: Rhonchi present. No wheezing.  Abdominal:     General: There is no distension.     Tenderness: There is abdominal tenderness (Diffuse tenderness to palpation).   Musculoskeletal:        General: Normal range of motion.     Cervical back: Normal range of motion.  Skin:    General: Skin is warm.  Neurological:     Mental Status: She is alert. Mental status is at baseline.     Cranial Nerves: Cranial nerves 2-12 are intact.     Sensory: Sensation is intact.     Motor: Motor function is intact.     Comments: Moving all extremities.  4/5 strength bilateral upper and lower extremities.     IMPRESSION / MDM / ASSESSMENT AND PLAN / ED COURSE  I reviewed the triage vital signs and the nursing notes.  Differential diagnosis including infectious process, sepsis, dehydration, intracranial hemorrhage, infarction, electrolyte abnormality  On chart review history of urinary tract infection, last urinary tract infection was positive for E. coli that was pansensitive.  Patient did receive treatment.  EKG  I, Corena Herter, the attending physician, personally viewed and interpreted this ECG.  Sinus tachycardia with a rate of 108.  Signs of  atrial enlargement.  QTc 451.  No significant ST elevation or depression.  No signs of acute ischemia or dysrhythmia.  No tachycardic or bradycardic dysrhythmias while on cardiac telemetry.  RADIOLOGY I independently reviewed imaging, my interpretation of imaging: CT head no signs of acute intracranial hemorrhage or infarction.  Multiple chronic changes noted  Chest x-ray with multifocal findings of the right lung.  Read as concern for multifocal pneumonia    LABS (all labs ordered are listed, but only abnormal results are displayed) Labs interpreted as -    Labs Reviewed  COMPREHENSIVE METABOLIC PANEL - Abnormal; Notable for the following components:      Result Value   Glucose, Bld 138 (*)    Total Protein 6.3 (*)    All other components within normal limits  LACTIC ACID, PLASMA - Abnormal; Notable for the following components:   Lactic Acid, Venous 2.9 (*)    All other components within normal limits   CBC WITH DIFFERENTIAL/PLATELET - Abnormal; Notable for the following components:   Neutro Abs 9.3 (*)    Lymphs Abs 0.1 (*)    All other components within normal limits  URINALYSIS, W/ REFLEX TO CULTURE (INFECTION SUSPECTED) - Abnormal; Notable for the following components:   Color, Urine YELLOW (*)    APPearance CLEAR (*)    Hgb urine dipstick SMALL (*)    All other components within normal limits  CULTURE, BLOOD (SINGLE)  SARS CORONAVIRUS 2 BY RT PCR  CULTURE, BLOOD (SINGLE)  LIPASE, BLOOD  MAGNESIUM  TSH  LACTIC ACID, PLASMA  CBC  CREATININE, SERUM  TROPONIN I (HIGH SENSITIVITY)  TROPONIN I (HIGH SENSITIVITY)     MDM  Blood cultures obtained.  Patient's lactic acid elevated at 2.9.  Given 1 L of IV fluids.  Concern for community-acquired pneumonia based on chest x-ray of the right side of her lungs with multifocal pneumonia.  No recent antibiotic use.  Will start the patient on IV Rocephin and azithromycin to treat a community-acquired pneumonia.  No known history of aspiration.  Given concerning features we will obtain CT scan to evaluate for possible pulmonary embolism.  No significant electrolyte abnormalities.  Urine without signs of an infectious process.  COVID test pending.  Consulted hospitalist for admission for community-acquired pneumonia.  Remained stable on room air.     PROCEDURES:  Critical Care performed: yes  .Critical Care  Performed by: Corena Herter, MD Authorized by: Corena Herter, MD   Critical care provider statement:    Critical care time (minutes):  30   Critical care time was exclusive of:  Separately billable procedures and treating other patients   Critical care was necessary to treat or prevent imminent or life-threatening deterioration of the following conditions:  Sepsis   Critical care was time spent personally by me on the following activities:  Development of treatment plan with patient or surrogate, discussions with consultants,  evaluation of patient's response to treatment, examination of patient, ordering and review of laboratory studies, ordering and review of radiographic studies, ordering and performing treatments and interventions, pulse oximetry, re-evaluation of patient's condition and review of old charts   Patient's presentation is most consistent with acute presentation with potential threat to life or bodily function.   MEDICATIONS ORDERED IN ED: Medications  lactated ringers infusion ( Intravenous New Bag/Given 10/29/22 1050)  azithromycin (ZITHROMAX) 500 mg in sodium chloride 0.9 % 250 mL IVPB (500 mg Intravenous New Bag/Given 10/29/22 1051)  enoxaparin (LOVENOX) injection 40 mg (has no administration  in time range)  acetaminophen (TYLENOL) tablet 650 mg (has no administration in time range)    Or  acetaminophen (TYLENOL) suppository 650 mg (has no administration in time range)  ondansetron (ZOFRAN) tablet 4 mg (has no administration in time range)    Or  ondansetron (ZOFRAN) injection 4 mg (has no administration in time range)  cefTRIAXone (ROCEPHIN) 1 g in sodium chloride 0.9 % 100 mL IVPB (has no administration in time range)  sodium chloride 0.9 % bolus 1,000 mL (1,000 mLs Intravenous New Bag/Given 10/29/22 0946)  iohexol (OMNIPAQUE) 350 MG/ML injection 75 mL (75 mLs Intravenous Contrast Given 10/29/22 1058)    FINAL CLINICAL IMPRESSION(S) / ED DIAGNOSES   Final diagnoses:  Community acquired pneumonia of right lung, unspecified part of lung  Weakness     Rx / DC Orders   ED Discharge Orders     None        Note:  This document was prepared using Dragon voice recognition software and may include unintentional dictation errors.   Corena Herter, MD 10/29/22 1059

## 2022-10-29 NOTE — ED Notes (Signed)
Attempted to call [facility] x2 to make aware of bed assignment, no answer, line busy.

## 2022-10-29 NOTE — ED Notes (Signed)
Assisted pt with the bedpan 

## 2022-10-29 NOTE — ED Notes (Signed)
Lactic     2.9 

## 2022-10-29 NOTE — ED Notes (Signed)
Assisted patient on and off bedpan

## 2022-10-29 NOTE — Progress Notes (Addendum)
Received patient from ED at 2022. Patient is AAOX4 but seems a bit confused at times. On RA, breathing is even and unlabored. Patient noted with sacral redness and 2 fading bruises on her back. Denies pain or discomfort at this time. Patient was oriented to room and call bell placed within reach.

## 2022-10-29 NOTE — Assessment & Plan Note (Signed)
Continue home Clozaril, Luvox, Atarax

## 2022-10-29 NOTE — Assessment & Plan Note (Signed)
POA due to multifocal pneumonia with HR 110, RR 24, lactic acid elevated at 2.9 consistent with organ dysfunction and severe sepsis. --Treated per sepsis protocol in the ED with IV antibiotics and fluids -- Continue IV antibiotics as outlined --Follow cultures --Trend lactate Monitor fever curve, CBC

## 2022-10-29 NOTE — Assessment & Plan Note (Signed)
Continue PPI ?

## 2022-10-30 DIAGNOSIS — F17210 Nicotine dependence, cigarettes, uncomplicated: Secondary | ICD-10-CM | POA: Diagnosis present

## 2022-10-30 DIAGNOSIS — K219 Gastro-esophageal reflux disease without esophagitis: Secondary | ICD-10-CM | POA: Diagnosis present

## 2022-10-30 DIAGNOSIS — E785 Hyperlipidemia, unspecified: Secondary | ICD-10-CM | POA: Diagnosis present

## 2022-10-30 DIAGNOSIS — Z79899 Other long term (current) drug therapy: Secondary | ICD-10-CM | POA: Diagnosis not present

## 2022-10-30 DIAGNOSIS — J189 Pneumonia, unspecified organism: Secondary | ICD-10-CM | POA: Diagnosis present

## 2022-10-30 DIAGNOSIS — Z7983 Long term (current) use of bisphosphonates: Secondary | ICD-10-CM | POA: Diagnosis not present

## 2022-10-30 DIAGNOSIS — Z8049 Family history of malignant neoplasm of other genital organs: Secondary | ICD-10-CM | POA: Diagnosis not present

## 2022-10-30 DIAGNOSIS — Z8249 Family history of ischemic heart disease and other diseases of the circulatory system: Secondary | ICD-10-CM | POA: Diagnosis not present

## 2022-10-30 DIAGNOSIS — Z91048 Other nonmedicinal substance allergy status: Secondary | ICD-10-CM | POA: Diagnosis not present

## 2022-10-30 DIAGNOSIS — K582 Mixed irritable bowel syndrome: Secondary | ICD-10-CM | POA: Diagnosis present

## 2022-10-30 DIAGNOSIS — F319 Bipolar disorder, unspecified: Secondary | ICD-10-CM | POA: Diagnosis present

## 2022-10-30 DIAGNOSIS — G8929 Other chronic pain: Secondary | ICD-10-CM | POA: Diagnosis present

## 2022-10-30 DIAGNOSIS — F259 Schizoaffective disorder, unspecified: Secondary | ICD-10-CM | POA: Diagnosis present

## 2022-10-30 DIAGNOSIS — R531 Weakness: Secondary | ICD-10-CM | POA: Diagnosis present

## 2022-10-30 DIAGNOSIS — Z853 Personal history of malignant neoplasm of breast: Secondary | ICD-10-CM | POA: Diagnosis not present

## 2022-10-30 DIAGNOSIS — R109 Unspecified abdominal pain: Secondary | ICD-10-CM | POA: Diagnosis present

## 2022-10-30 DIAGNOSIS — J439 Emphysema, unspecified: Secondary | ICD-10-CM | POA: Diagnosis present

## 2022-10-30 DIAGNOSIS — M810 Age-related osteoporosis without current pathological fracture: Secondary | ICD-10-CM | POA: Diagnosis present

## 2022-10-30 DIAGNOSIS — R6520 Severe sepsis without septic shock: Secondary | ICD-10-CM | POA: Diagnosis present

## 2022-10-30 DIAGNOSIS — Z79811 Long term (current) use of aromatase inhibitors: Secondary | ICD-10-CM | POA: Diagnosis not present

## 2022-10-30 DIAGNOSIS — A419 Sepsis, unspecified organism: Secondary | ICD-10-CM | POA: Diagnosis present

## 2022-10-30 DIAGNOSIS — Z1152 Encounter for screening for COVID-19: Secondary | ICD-10-CM | POA: Diagnosis not present

## 2022-10-30 DIAGNOSIS — Z9012 Acquired absence of left breast and nipple: Secondary | ICD-10-CM | POA: Diagnosis not present

## 2022-10-30 LAB — BASIC METABOLIC PANEL
Anion gap: 6 (ref 5–15)
BUN: 10 mg/dL (ref 8–23)
CO2: 24 mmol/L (ref 22–32)
Calcium: 8.4 mg/dL — ABNORMAL LOW (ref 8.9–10.3)
Chloride: 111 mmol/L (ref 98–111)
Creatinine, Ser: 0.61 mg/dL (ref 0.44–1.00)
GFR, Estimated: >60 mL/min (ref >60)
Glucose, Bld: 93 mg/dL (ref 70–99)
Potassium: 3.7 mmol/L (ref 3.5–5.1)
Sodium: 141 mmol/L (ref 135–145)

## 2022-10-30 LAB — CBC
HCT: 34.1 % — ABNORMAL LOW (ref 36.0–46.0)
Hemoglobin: 11.4 g/dL — ABNORMAL LOW (ref 12.0–15.0)
MCH: 30.7 pg (ref 26.0–34.0)
MCHC: 33.4 g/dL (ref 30.0–36.0)
MCV: 91.9 fL (ref 80.0–100.0)
Platelets: 144 10*3/uL — ABNORMAL LOW (ref 150–400)
RBC: 3.71 MIL/uL — ABNORMAL LOW (ref 3.87–5.11)
RDW: 13.6 % (ref 11.5–15.5)
WBC: 7.9 10*3/uL (ref 4.0–10.5)
nRBC: 0 % (ref 0.0–0.2)

## 2022-10-30 NOTE — Progress Notes (Signed)
Progress Note   Patient: Selena Pittman:[id number] DOB: 05/24/1950 DOA: 10/29/2022     0 DOS: the patient was seen and examined on 10/30/2022   Brief hospital course: HPI on admission 10/29/22 -- Zephyr Febles is a 72 y.o. female with medical history significant of bipolar affective disorder, schizophrenia, depression, hyperlipidemia who presents to the ED for evaluation today after home health nurse found the patient to have progressively worsening generalized weakness and fatigue.  Patient reportedly had a fall and it was unknown whether or not she hit her head or had any loss of consciousness.  Not on blood thinners.  Patient reports having a cough for about a week.  Denies fevers or chills.  She reports some abdominal discomfort but no nausea vomiting or diarrhea.  She is prone to chronic constipation.  Patient is a poor historian, further history is limited.     On chart review, patient was admitted in December 2023 with a lung mass that was concerning for necrotic pneumonia based on biopsy positive for abscess features.  She  was treated with a long course of Bactrim and Augmentin per infectious disease.   ED course -  tachycardic (HR 110), tachypneic (RR 20-24), normal but soft BP 105/73, O2 sat 98% on room air, afebrile.  Labs were obtained including CMP and CBC that were notable only for nonfasting glucose 138, total protein 6.3.  Lactic acid initially elevated 2.9.  Chest x-ray showed right-sided patchy infiltrates concerning for multifocal pneumonia.   CTA chest was negative for PE, showed multifocal right-sided centrilobular groundglass opacities which are nonspecific but could reflect atypical infection versus aspiration.  Esophagus appeared patulous with layering internal fluid which increases risk of aspiration.   She was treated in the ED with IV antibiotics and admitted to the hospital for further evaluation management of severe sepsis due to multifocal  pneumonia.    Assessment and Plan: * Multifocal pneumonia Right-sided patchy infiltrates on the chest x-ray and CTA chest.  Presented with cough x 1 week, progressive generalized weakness and fatigue. --IV Rocephin and Zithromax --Scheduled Mucinex --Flutter valve --Supplement O2 as needed to keep sats above 90%  Severe sepsis (HCC) POA due to multifocal pneumonia with HR 110, RR 24, lactic acid elevated at 2.9 consistent with organ dysfunction and severe sepsis. --Treated per sepsis protocol in the ED with IV antibiotics and fluids -- Continue IV antibiotics as outlined --Follow cultures --Trend lactate Monitor fever curve, CBC  IBS (irritable colon syndrome) Chronic constipation Continue home meds - Linzess, Colace, Miralax BID, glycerin suppositories PRN,   Hyperlipidemia Continue simvastatin  Bipolar affective disorder (HCC) Continue home Clozaril, Luvox, Atarax  GERD (gastroesophageal reflux disease) Continue PPI  Malignant neoplasm of left female breast (HCC) Continue home letrozole        Subjective: Pt seen this AM, awake resting in bed.  Reports ongoing abdominal pain that waxes and wanes, points to different areas of her abdomen for location.  Says she had a BM yesterday.  Denies cough or shortness of breath, but says she feels sick.  Feels weak but otherwise does not endorse specific complaints.  Physical Exam: Vitals:   10/30/22 0438 10/30/22 0820 10/30/22 1214 10/30/22 1215  BP: (!) 132/91 120/80 (!) 134/99   Pulse: 81 92 74 86  Resp: 12 16 18    Temp: 97.8 F (36.6 C) 98.6 F (37 C) (!) 97.5 F (36.4 C)   TempSrc: Oral Oral Oral   SpO2: 99% 100% (!) 84% 99%  Weight:  Height:       General exam: awake, alert, no acute distress HEENT: moist mucus membranes, hearing grossly normal  Respiratory system: diminished breath sounds right more than left, no wheezes, normal respiratory effort. Cardiovascular system: normal S1/S2, RRR, no pedal edema.    Gastrointestinal system: soft, NT, ND, no HSM felt, +bowel sounds. Central nervous system: A&O x 2+. no gross focal neurologic deficits, normal speech Extremities: moves all, no edema, normal tone Skin: dry, intact, normal temperature, normal color, no rashes, lesions or ulcers Psychiatry: normal mood, congruent affect   Data Reviewed:  Notable labs --  BMP normal except Ca 8.4.  Hbg 11.4 platelets 144k  Lactate normalized 1.8  Family Communication: NOne  Disposition: Status is: Inpatient Remains inpatient appropriate because: remains on IV antibiotics pending furhter clinical improvement  Planned Discharge Destination: Home    Time spent: 42 minutes  Author: Pennie Banter, DO 10/30/2022 2:18 PM  For on call review [email or web address].

## 2022-10-30 NOTE — Progress Notes (Signed)
Mobility Specialist - Progress Note   10/30/22 1524  Mobility  Activity Turned to right side  Level of Assistance Independent  Mobility Specialist Start Time (ACUTE ONLY) 1511  Mobility Specialist Stop Time (ACUTE ONLY) 1520  Mobility Specialist Time Calculation (min) (ACUTE ONLY) 9 min   Pt supine upon entry, utilizing RA. Pt initially agreeable to mobility, however quickly changes mind when cover is removed. Pt left lying on the right side with alarm set and needs within reach. RN notified.  Zetta Bills Mobility Specialist 10/30/22 3:27 PM

## 2022-10-30 NOTE — Care Management Obs Status (Signed)
MEDICARE OBSERVATION STATUS NOTIFICATION   Patient Details  Name: Selena Pittman MRN: [id number] Date of Birth: 10-Jul-1950   Medicare Observation Status Notification Given:  Yes    Chailyn Racette, LCSW 10/30/2022, 11:42 AM

## 2022-10-30 NOTE — TOC Progression Note (Signed)
Transition of Care [facility]) - Progression Note    Patient Details  Name: Selena Pittman MRN: [id number] Date of Birth: 20-Jan-1951  Transition of Care [facility]) CM/SW Contact  Allena Katz, LCSW Phone Number: 10/30/2022, 2:19 PM  Clinical Narrative:   CSW spoke with Sanjuana Kava at [phone] at [facility]. Isabelle Course reports pt has to be able to walk to return. Team notified.          Expected Discharge Plan and Services                                               Social Determinants of Health (SDOH) Interventions SDOH Screenings   Food Insecurity: No Food Insecurity (10/29/2022)  Housing: Low Risk  (10/29/2022)  Transportation Needs: No Transportation Needs (10/29/2022)  Utilities: Not At Risk (10/29/2022)  Tobacco Use: High Risk (10/29/2022)    Readmission Risk Interventions     No data to display

## 2022-10-30 NOTE — Evaluation (Signed)
Physical Therapy Evaluation Patient Details Name: Selena Pittman MRN: [id number] DOB: November 16, 1950 Today's Date: 10/30/2022  History of Present Illness  72 y/o female presented to ED on 10/29/22 for weakness and abdominal pain. CXR concerned for multifocal pneumonia. Admitted for severe sepsis 2/2 PNA. PMH: bipolar affective disorder, schizophrenia, depression  Clinical Impression  Patient admitted with the above. Patient is poor historian and provided limited history on PLOF. Per chart, patient lives at ALF and patient endorses ambulating with no AD. Patient presents with weakness, impaired balance, and decreased activity tolerance. Patient required max encouragement to participate in evaluation and required education on benefits of mobility. Required min guard for bed mobility and minA to stand at bedside with HHAx2. Took side steps at EOB with min guard. Quickly requesting to return to supine. Patient will benefit from skilled PT services during acute stay to address listed deficits. Patient will benefit from ongoing therapy at discharge to maximize functional independence and safety.       Assistance Recommended at Discharge Intermittent Supervision/Assistance  If plan is discharge home, recommend the following:  Can travel by private vehicle  A little help with walking and/or transfers;A little help with bathing/dressing/bathroom;Assistance with cooking/housework;Assist for transportation;Help with stairs or ramp for entrance;Direct supervision/assist for medications management;Direct supervision/assist for financial management        Equipment Recommendations Rolling Dovber Ernest (2 wheels)  Recommendations for Other Services       Functional Status Assessment Patient has had a recent decline in their functional status and demonstrates the ability to make significant improvements in function in a reasonable and predictable amount of time.     Precautions / Restrictions  Precautions Precautions: Fall      Mobility  Bed Mobility Overal bed mobility: Needs Assistance Bed Mobility: Supine to Sit, Sit to Supine     Supine to sit: Min guard Sit to supine: Min guard        Transfers Overall transfer level: Needs assistance Equipment used: 2 person hand held assist Transfers: Sit to/from Stand Sit to Stand: Min assist           General transfer comment: assist to stand with patient pulling on therapist hand. Took side steps at EOB but refused further mobility    Ambulation/Gait                  Stairs            Wheelchair Mobility     Tilt Bed    Modified Rankin (Stroke Patients Only)       Balance Overall balance assessment: Needs assistance Sitting-balance support: No upper extremity supported, Feet supported Sitting balance-Leahy Scale: Good     Standing balance support: Single extremity supported, During functional activity Standing balance-Leahy Scale: Fair                               Pertinent Vitals/Pain Pain Assessment Pain Assessment: Faces Faces Pain Scale: No hurt Pain Intervention(s): Monitored during session    Home Living Family/patient expects to be discharged to:: Assisted living                   Additional Comments: pt is a poor historian, reports she amb with no AD    Prior Function Prior Level of Function : Patient poor historian/Family not available       Physical Assist : ADLs (physical)   ADLs (physical): Bathing;Dressing Mobility Comments: pt reports amb  with no AD but has a RW prn use ADLs Comments: sub optimal participation in biographical report,     Hand Dominance   Dominant Hand: Left    Extremity/Trunk Assessment   Upper Extremity Assessment Upper Extremity Assessment: LUE deficits/detail;Generalized weakness RUE Deficits / Details: R hand flexion contractures noted throughout DIP/PIPs, wrist in flexion; this appears baseline, which pt  confirms    Lower Extremity Assessment Lower Extremity Assessment: Defer to PT evaluation;Generalized weakness    Cervical / Trunk Assessment Cervical / Trunk Assessment: Kyphotic  Communication   Communication: No difficulties  Cognition Arousal/Alertness: Awake/alert Behavior During Therapy: Flat affect Overall Cognitive Status: No family/caregiver present to determine baseline cognitive functioning                                 General Comments: requires max encouragement to participate in session        General Comments      Exercises     Assessment/Plan    PT Assessment Patient needs continued PT services  PT Problem List Decreased strength;Decreased activity tolerance;Decreased balance;Decreased mobility;Decreased cognition;Decreased knowledge of use of DME;Decreased safety awareness;Decreased knowledge of precautions;Cardiopulmonary status limiting activity       PT Treatment Interventions DME instruction;Gait training;Functional mobility training;Therapeutic activities;Therapeutic exercise;Balance training;Patient/family education    PT Goals (Current goals can be found in the Care Plan section)  Acute Rehab PT Goals Patient Stated Goal: to go home after hospital stay PT Goal Formulation: Patient unable to participate in goal setting Time For Goal Achievement: 11/13/22 Potential to Achieve Goals: Fair    Frequency Min 2X/week     Co-evaluation PT/OT/SLP Co-Evaluation/Treatment: Yes Reason for Co-Treatment: Necessary to address cognition/behavior during functional activity PT goals addressed during session: Mobility/safety with mobility;Balance OT goals addressed during session: ADL's and self-care       AM-PAC PT "6 Clicks" Mobility  Outcome Measure Help needed turning from your back to your side while in a flat bed without using bedrails?: A Little Help needed moving from lying on your back to sitting on the side of a flat bed without  using bedrails?: A Little Help needed moving to and from a bed to a chair (including a wheelchair)?: A Little Help needed standing up from a chair using your arms (e.g., wheelchair or bedside chair)?: A Little Help needed to walk in hospital room?: A Little Help needed climbing 3-5 steps with a railing? : A Lot 6 Click Score: 17    End of Session   Activity Tolerance: Patient tolerated treatment well Patient left: in bed;with call bell/phone within reach;with bed alarm set Nurse Communication: Mobility status PT Visit Diagnosis: Unsteadiness on feet (R26.81);Muscle weakness (generalized) (M62.81);Other abnormalities of gait and mobility (R26.89);History of falling (Z91.81)    Time: 1610-9604 PT Time Calculation (min) (ACUTE ONLY): 13 min   Charges:   PT Evaluation $PT Eval Moderate Complexity: 1 Mod   PT General Charges $$ ACUTE PT VISIT: 1 Visit         Maylon Peppers, PT, DPT Physical Therapist - [facility]  [facility]   Romelo Sciandra A Rodarius Kichline 10/30/2022, 12:06 PM

## 2022-10-30 NOTE — Evaluation (Signed)
Occupational Therapy Evaluation Patient Details Name: Selena Pittman MRN: [id number] DOB: Aug 22, 1950 Today's Date: 10/30/2022   History of Present Illness 72 y/o female presented to ED on 10/29/22 for weakness and abdominal pain. CXR concerned for multifocal pneumonia. Admitted for severe sepsis 2/2 PNA. PMH: bipolar affective disorder, schizophrenia, depression   Clinical Impression   Chart reviewed, pt greeted in bed with PT present. Pt requires encouragement for participation in co-evaluation. Pt is a poor report/poor participant in biographical information but per chart pt lives in an ALF and has some assist for ADL/IADL. Pt endorses she does not amb with AD. Pt presents with deficits in strength, endurance, balance, activity tolerance affecting safe and optimal ADL completion. Evaluation  is limited as pt declines/refuses to participate in many provided tasks, including changing her clothes and underwear despite significant encouragement. Pt does agree to further participation in University the future. OT will continue to follow acutely.     Recommendations for follow up therapy are one component of a multi-disciplinary discharge planning process, led by the attending physician.  Recommendations may be updated based on patient status, additional functional criteria and insurance authorization.   Assistance Recommended at Discharge Frequent or constant Supervision/Assistance  Patient can return home with the following A little help with walking and/or transfers;A little help with bathing/dressing/bathroom    Functional Status Assessment  Patient has had a recent decline in their functional status and demonstrates the ability to make significant improvements in function in a reasonable and predictable amount of time.  Equipment Recommendations  None recommended by OT    Recommendations for Other Services       Precautions / Restrictions Precautions Precautions: Fall      Mobility  Bed Mobility Overal bed mobility: Needs Assistance Bed Mobility: Supine to Sit, Sit to Supine     Supine to sit: Min guard Sit to supine: Min guard        Transfers Overall transfer level: Needs assistance Equipment used: 2 person hand held assist Transfers: Sit to/from Stand Sit to Stand: Min assist           General transfer comment: steps at edge of bed with MIN A +2, refusing further mobility      Balance Overall balance assessment: Needs assistance Sitting-balance support: No upper extremity supported, Feet supported Sitting balance-Leahy Scale: Good     Standing balance support: Bilateral upper extremity supported Standing balance-Leahy Scale: Fair                             ADL either performed or assessed with clinical judgement   ADL Overall ADL's : Needs assistance/impaired Eating/Feeding: Set up;Sitting;Supervision/ safety   Grooming: Set up Grooming Details (indicate cue type and reason): simulated, anticipated             Lower Body Dressing: Maximal assistance Lower Body Dressing Details (indicate cue type and reason): due to poor praticipation                     Vision Patient Visual Report: No change from baseline Additional Comments: will continue to assess            Pertinent Vitals/Pain Pain Assessment Pain Assessment: Faces Faces Pain Scale: Hurts little more Pain Location: generalized Pain Descriptors / Indicators: Discomfort Pain Intervention(s): Monitored during session, Repositioned     Hand Dominance Left   Extremity/Trunk Assessment Upper Extremity Assessment Upper Extremity Assessment: LUE deficits/detail;Generalized weakness RUE  Deficits / Details: R hand flexion contractures noted throughout DIP/PIPs, wrist in flexion; this appears baseline, which pt confirms   Lower Extremity Assessment Lower Extremity Assessment: Defer to PT evaluation;Generalized weakness   Cervical / Trunk  Assessment Cervical / Trunk Assessment: Kyphotic   Communication Communication Communication: No difficulties   Cognition Arousal/Alertness: Awake/alert Behavior During Therapy: Anxious Overall Cognitive Status: No family/caregiver present to determine baseline cognitive functioning Area of Impairment: Orientation, Attention, Memory, Following commands, Safety/judgement, Awareness                 Orientation Level: Disoriented to, Time Current Attention Level: Sustained   Following Commands: Follows one step commands with increased time Safety/Judgement: Decreased awareness of deficits Awareness: Intellectual         General Comments  pt refusing to change shirt or brief    Exercises Other Exercises Other Exercises: edu pt re role of OT, role of rehab, importance of participation and continued progressing mobility/ADL performance        Home Living Family/patient expects to be discharged to:: Assisted living                                 Additional Comments: pt is a poor historian, reports she amb with no AD      Prior Functioning/Environment Prior Level of Function : Patient poor historian/Family not available       Physical Assist : ADLs (physical)   ADLs (physical): Bathing;Dressing Mobility Comments: pt reports amb with no AD but has a RW prn use ADLs Comments: sub optimal participation in biographical report, will need to confirm         OT Problem List: Decreased strength;Decreased activity tolerance;Decreased safety awareness      OT Treatment/Interventions: Self-care/ADL training;DME and/or AE instruction;Therapeutic activities;Balance training;Therapeutic exercise;Patient/family education    OT Goals(Current goals can be found in the care plan section) Acute Rehab OT Goals Patient Stated Goal: get better OT Goal Formulation: With patient Time For Goal Achievement: 11/13/22 Potential to Achieve Goals: Good  OT Frequency: Min  1X/week    Co-evaluation PT/OT/SLP Co-Evaluation/Treatment: Yes Reason for Co-Treatment: Necessary to address cognition/behavior during functional activity PT goals addressed during session: Mobility/safety with mobility;Balance OT goals addressed during session: ADL's and self-care      AM-PAC OT "6 Clicks" Daily Activity     Outcome Measure Help from another person eating meals?: None Help from another person taking care of personal grooming?: A Little Help from another person toileting, which includes using toliet, bedpan, or urinal?: A Lot Help from another person bathing (including washing, rinsing, drying)?: A Lot Help from another person to put on and taking off regular upper body clothing?: A Little Help from another person to put on and taking off regular lower body clothing?: A Lot 6 Click Score: 16   End of Session    Activity Tolerance: Other (comment);Patient tolerated treatment well (fair participation) Patient left: in bed;with call bell/phone within reach;with bed alarm set  OT Visit Diagnosis: Other abnormalities of gait and mobility (R26.89)                Time: 7829-5621 OT Time Calculation (min): 10 min Charges:  OT General Charges $OT Visit: 1 Visit OT Evaluation $OT Eval Low Complexity: 1 Low Oleta Mouse, OTD OTR/L  10/30/22, 12:17 PM

## 2022-10-30 NOTE — Plan of Care (Signed)
  Problem: Education: Goal: Knowledge of General Education information will improve Description Including pain rating scale, medication(s)/side effects and non-pharmacologic comfort measures Outcome: Progressing   

## 2022-10-31 ENCOUNTER — Encounter: Payer: Self-pay | Admitting: Internal Medicine

## 2022-10-31 DIAGNOSIS — R531 Weakness: Secondary | ICD-10-CM | POA: Diagnosis not present

## 2022-10-31 DIAGNOSIS — J189 Pneumonia, unspecified organism: Secondary | ICD-10-CM | POA: Diagnosis not present

## 2022-10-31 DIAGNOSIS — A419 Sepsis, unspecified organism: Secondary | ICD-10-CM | POA: Diagnosis not present

## 2022-10-31 LAB — MRSA NEXT GEN BY PCR, NASAL: MRSA by PCR Next Gen: NOT DETECTED

## 2022-10-31 MED ORDER — DICYCLOMINE HCL 10 MG/5ML PO SOLN: 10.0000 mg | Freq: Three times a day (TID) | ORAL | 0 refills | Status: DC | PRN

## 2022-10-31 MED ORDER — AZITHROMYCIN 500 MG PO TABS: 500.0000 mg | ORAL_TABLET | Freq: Every day | ORAL | 0 refills | Status: DC

## 2022-10-31 MED ORDER — CEFDINIR 300 MG PO CAPS
300.0000 mg | ORAL_CAPSULE | Freq: Two times a day (BID) | ORAL | Status: AC
  Administered 2022-10-31 – 2022-11-02 (×6): 300 mg via ORAL
  Filled 2022-10-31 (×6): qty 1

## 2022-10-31 MED ORDER — CEFDINIR 300 MG PO CAPS: 300.0000 mg | ORAL_CAPSULE | Freq: Two times a day (BID) | ORAL | 0 refills | Status: DC

## 2022-10-31 MED ORDER — AZITHROMYCIN 500 MG PO TABS
500.0000 mg | ORAL_TABLET | Freq: Every day | ORAL | Status: AC
  Administered 2022-10-31 – 2022-11-02 (×3): 500 mg via ORAL
  Filled 2022-10-31 (×3): qty 1

## 2022-10-31 MED ORDER — ACETAMINOPHEN 325 MG PO TABS: 650.0000 mg | ORAL_TABLET | Freq: Four times a day (QID) | ORAL | Status: DC | PRN

## 2022-10-31 MED ORDER — DM-GUAIFENESIN ER 30-600 MG PO TB12: 1.0000 | ORAL_TABLET | Freq: Two times a day (BID) | ORAL | 0 refills | Status: DC

## 2022-10-31 MED ORDER — DICYCLOMINE HCL 10 MG/5ML PO SOLN
10.0000 mg | Freq: Three times a day (TID) | ORAL | Status: DC
  Administered 2022-10-31 – 2022-11-04 (×12): 10 mg via ORAL
  Filled 2022-10-31 (×15): qty 5
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
## 2022-10-31 NOTE — Progress Notes (Signed)
Repeat message for on-call APS social worker left again.
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
## 2022-10-31 NOTE — Progress Notes (Signed)
Call made to APS answering service. On-call to return call to unit #.

## 2022-10-31 NOTE — Progress Notes (Signed)
Occupational Therapy Treatment Patient Details Name: Selena Pittman MRN: [id number] DOB: 11/26/1950 Today's Date: 10/31/2022   History of present illness 72 y/o female presented to ED on 10/29/22 for weakness and abdominal pain. CXR concerned for multifocal pneumonia. Admitted for severe sepsis 2/2 PNA. PMH: bipolar affective disorder, schizophrenia, depression   OT comments  Chart reviewed, pt greeted in bed, more agreeable to OT tx session on this date. Tx session targeted improving functional mobility and ADL participation to facilitate return to PLOF. Improvements noted in STS with CGA-MIN A, pt maintaining static, sometimes dynamic standing balance for approx 4 minutes during toileting after incontinent BM with CGA-MIN A, amb approx 6' in room via hand held assist with MIN A to bedside chair. Pt is left in chair, all needs met. Safety maintained, nurse aware of pt progress. OT will continue to follow acutely.    Recommendations for follow up therapy are one component of a multi-disciplinary discharge planning process, led by the attending physician.  Recommendations may be updated based on patient status, additional functional criteria and insurance authorization.    Assistance Recommended at Discharge Frequent or constant Supervision/Assistance  Patient can return home with the following  A little help with walking and/or transfers;A little help with bathing/dressing/bathroom   Equipment Recommendations       Recommendations for Other Services      Precautions / Restrictions Precautions Precautions: Fall       Mobility Bed Mobility Overal bed mobility: Needs Assistance Bed Mobility: Supine to Sit     Supine to sit: Min guard          Transfers Overall transfer level: Needs assistance Equipment used: 1 person hand held assist Transfers: Sit to/from Stand Sit to Stand: Min guard, Min assist (multiple attempts during peri care)                 Balance Overall  balance assessment: Needs assistance Sitting-balance support: No upper extremity supported, Feet supported Sitting balance-Leahy Scale: Good     Standing balance support: Single extremity supported Standing balance-Leahy Scale: Fair                             ADL either performed or assessed with clinical judgement   ADL Overall ADL's : Needs assistance/impaired     Grooming: Set up;Sitting       Lower Body Bathing: Maximal assistance       Lower Body Dressing: Sit to/from stand;Moderate assistance Lower Body Dressing Details (indicate cue type and reason): doff soiled brief after incontient BM, Toilet Transfer: Minimal assistance Toilet Transfer Details (indicate cue type and reason): via HHA, simulated to bedside chair Toileting- Clothing Manipulation and Hygiene: Moderate assistance;Sit to/from stand Toileting - Clothing Manipulation Details (indicate cue type and reason): underwear; MAX A for wiping after BM.Pt stands for approx 4 min during care with CGA-MIN A     Functional mobility during ADLs: Minimal assistance (approx 6' in room via [facility])      Extremity/Trunk Assessment              Vision       Perception     Praxis      Cognition Arousal/Alertness: Awake/alert Behavior During Therapy: WFL for tasks assessed/performed Overall Cognitive Status: No family/caregiver present to determine baseline cognitive functioning Area of Impairment: Orientation, Attention, Memory, Following commands, Safety/judgement, Awareness  Orientation Level: Disoriented to, Time Current Attention Level: Sustained Memory: Decreased short-term memory Following Commands: Follows one step commands with increased time Safety/Judgement: Decreased awareness of deficits Awareness: Intellectual   General Comments: decreased encouragement compared to participate in session compared to evaluation        Exercises      Shoulder Instructions        General Comments vital signs appear stable throughout, step by step vcs required throughout    Pertinent Vitals/ Pain       Pain Assessment Pain Assessment: No/denies pain  Home Living                                          Prior Functioning/Environment              Frequency  Min 1X/week        Progress Toward Goals  OT Goals(current goals can now be found in the care plan section)  Progress towards OT goals: Progressing toward goals     Plan Discharge plan remains appropriate    Co-evaluation                 AM-PAC OT "6 Clicks" Daily Activity     Outcome Measure   Help from another person eating meals?: None Help from another person taking care of personal grooming?: A Little Help from another person toileting, which includes using toliet, bedpan, or urinal?: A Lot Help from another person bathing (including washing, rinsing, drying)?: A Lot Help from another person to put on and taking off regular upper body clothing?: A Little Help from another person to put on and taking off regular lower body clothing?: A Lot 6 Click Score: 16    End of Session    OT Visit Diagnosis: Other abnormalities of gait and mobility (R26.89)   Activity Tolerance Patient tolerated treatment well   Patient Left in chair;with call bell/phone within reach;with chair alarm set   Nurse Communication Mobility status;Other (comment) (NT re: status)        Time: 1610-9604 OT Time Calculation (min): 20 min  Charges: OT General Charges $OT Visit: 1 Visit OT Treatments $Self Care/Home Management : 8-22 mins  Oleta Mouse, OTD OTR/L  10/31/22, 11:30 AM

## 2022-10-31 NOTE — Progress Notes (Signed)
Received call from Sanjuana Kava who stated she is the administrator. She stated she was calling regarding the prescriptions since to [facility]. She stated, "I will NOT be picking up medications at [facility]. I do Not use [facility], so if you want her to get medications then they need to be sent to [facility] in [place]."   Ms. Lorella Nimrod then asked for an update. No extensive update given. Just told patient would not be discharging at this time.   Ms. Lorella Nimrod then stated, "Well if she can't walk and take care of herself mostly then she can't come back"

## 2022-10-31 NOTE — Plan of Care (Signed)
  Problem: Education: Goal: Knowledge of General Education information will improve Description: Including pain rating scale, medication(s)/side effects and non-pharmacologic comfort measures Outcome: Progressing   Problem: Clinical Measurements: Goal: Ability to maintain clinical measurements within normal limits will improve Outcome: Progressing Goal: Diagnostic test results will improve Outcome: Progressing Goal: Respiratory complications will improve Outcome: Progressing   Problem: Pain Managment: Goal: General experience of comfort will improve Outcome: Progressing   Problem: Safety: Goal: Ability to remain free from injury will improve Outcome: Progressing

## 2022-10-31 NOTE — Progress Notes (Signed)
APS report filed via telephone.

## 2022-10-31 NOTE — Progress Notes (Signed)
Received call from on-call APS worker, who stated she was en route to an emergency but would call back.

## 2022-11-01 ENCOUNTER — Inpatient Hospital Stay: Payer: Medicare Other

## 2022-11-01 DIAGNOSIS — J189 Pneumonia, unspecified organism: Secondary | ICD-10-CM | POA: Diagnosis not present

## 2022-11-01 DIAGNOSIS — G8929 Other chronic pain: Secondary | ICD-10-CM | POA: Diagnosis present

## 2022-11-01 DIAGNOSIS — A419 Sepsis, unspecified organism: Secondary | ICD-10-CM | POA: Diagnosis not present

## 2022-11-01 DIAGNOSIS — R109 Unspecified abdominal pain: Secondary | ICD-10-CM | POA: Diagnosis present

## 2022-11-01 DIAGNOSIS — R531 Weakness: Secondary | ICD-10-CM | POA: Diagnosis not present

## 2022-11-01 LAB — OCCULT BLOOD X 1 CARD TO LAB, STOOL: Fecal Occult Bld: NEGATIVE

## 2022-11-01 MED ORDER — ZINC OXIDE 40 % EX OINT
TOPICAL_OINTMENT | CUTANEOUS | Status: DC | PRN
  Filled 2022-11-01: qty 57
  Filled 2022-11-01: qty 113

## 2022-11-01 MED ORDER — POLYETHYLENE GLYCOL 3350 17 G PO PACK: 17.0000 g | PACK | Freq: Every day | ORAL | Status: DC | PRN

## 2022-11-01 MED ORDER — DOCUSATE SODIUM 100 MG PO CAPS: 100.0000 mg | ORAL_CAPSULE | Freq: Two times a day (BID) | ORAL | Status: DC | PRN

## 2022-11-01 NOTE — Progress Notes (Signed)
Mobility Specialist - Progress Note   11/01/22 0914  Mobility  Activity Refused mobility   Pt refuses mobility, no reason specified . Will attempt at another date and time.   Terrilyn Saver  Mobility Specialist  11/01/22 9:15 AM

## 2022-11-01 NOTE — Plan of Care (Signed)
  Problem: Education: Goal: Knowledge of General Education information will improve Description: Including pain rating scale, medication(s)/side effects and non-pharmacologic comfort measures Outcome: Progressing   Problem: Health Behavior/Discharge Planning: Goal: Ability to manage health-related needs will improve Outcome: Progressing   Problem: Clinical Measurements: Goal: Ability to maintain clinical measurements within normal limits will improve Outcome: Progressing Goal: Will remain free from infection Outcome: Progressing Goal: Diagnostic test results will improve Outcome: Progressing Goal: Respiratory complications will improve Outcome: Progressing Goal: Cardiovascular complication will be avoided Outcome: Progressing   Problem: Nutrition: Goal: Adequate nutrition will be maintained Outcome: Progressing   Problem: Coping: Goal: Level of anxiety will decrease Outcome: Progressing   Problem: Elimination: Goal: Will not experience complications related to bowel motility Outcome: Progressing Goal: Will not experience complications related to urinary retention Outcome: Progressing   Problem: Pain Managment: Goal: General experience of comfort will improve Outcome: Progressing   Problem: Safety: Goal: Ability to remain free from injury will improve Outcome: Progressing   Problem: Activity: Goal: Risk for activity intolerance will decrease Outcome: Not Progressing   
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
## 2022-11-01 NOTE — Assessment & Plan Note (Addendum)
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  She states "I was born with stomach aches". --Mgmt of IBS as outined --See Acute distention of stomach
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
## 2022-11-02 DIAGNOSIS — K310 Acute dilatation of stomach: Secondary | ICD-10-CM | POA: Clinically undetermined

## 2022-11-02 DIAGNOSIS — J189 Pneumonia, unspecified organism: Secondary | ICD-10-CM | POA: Diagnosis not present

## 2022-11-02 DIAGNOSIS — A419 Sepsis, unspecified organism: Secondary | ICD-10-CM | POA: Diagnosis not present

## 2022-11-02 DIAGNOSIS — R531 Weakness: Secondary | ICD-10-CM | POA: Diagnosis not present

## 2022-11-02 LAB — CULTURE, BLOOD (SINGLE)
Special Requests: ADEQUATE
Special Requests: ADEQUATE

## 2022-11-02 LAB — BASIC METABOLIC PANEL
Anion gap: 7 (ref 5–15)
BUN: 11 mg/dL (ref 8–23)
CO2: 25 mmol/L (ref 22–32)
Calcium: 8.7 mg/dL — ABNORMAL LOW (ref 8.9–10.3)
Chloride: 106 mmol/L (ref 98–111)
Creatinine, Ser: 0.73 mg/dL (ref 0.44–1.00)
GFR, Estimated: >60 mL/min (ref >60)
Glucose, Bld: 104 mg/dL — ABNORMAL HIGH (ref 70–99)
Potassium: 3.6 mmol/L (ref 3.5–5.1)
Sodium: 138 mmol/L (ref 135–145)

## 2022-11-02 LAB — CBC
HCT: 38.1 % (ref 36.0–46.0)
Hemoglobin: 12.6 g/dL (ref 12.0–15.0)
MCH: 29.9 pg (ref 26.0–34.0)
MCHC: 33.1 g/dL (ref 30.0–36.0)
MCV: 90.5 fL (ref 80.0–100.0)
Platelets: 197 10*3/uL (ref 150–400)
RBC: 4.21 MIL/uL (ref 3.87–5.11)
RDW: 12.9 % (ref 11.5–15.5)
WBC: 4.5 10*3/uL (ref 4.0–10.5)
nRBC: 0 % (ref 0.0–0.2)

## 2022-11-02 MED ORDER — METOCLOPRAMIDE HCL 10 MG PO TABS
10.0000 mg | ORAL_TABLET | Freq: Three times a day (TID) | ORAL | Status: DC
  Administered 2022-11-02 – 2022-11-04 (×8): 10 mg via ORAL
  Filled 2022-11-02 (×10): qty 1

## 2022-11-02 NOTE — Care Management Important Message (Signed)
Important Message  Patient Details  Name: Selena Pittman MRN: [id number] Date of Birth: 10-Feb-1951   Medicare Important Message Given:  N/A - LOS <3 / Initial given by admissions     Olegario Messier A Tregan Read 11/02/2022, 8:41 AM
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
## 2022-11-02 NOTE — Assessment & Plan Note (Signed)
Abdominal x-ray on 7/14 obtained due to pt c/o ongoing abdominal pain.  It showed her stomach markedly distended.  Discussed with GI.  Primary differentials are gastroparesis versus ileus.  Pt not having N/V. --Trial Reglan & monitor for improvement --Monitor abdominal exam --If worsening distension or N/V, might need NG tube & surgical consult for ileus.

## 2022-11-02 NOTE — Plan of Care (Signed)

## 2022-11-02 NOTE — Progress Notes (Signed)
Physical Therapy Treatment Patient Details Name: Selena Pittman MRN: [id number] DOB: 1951/02/18 Today's Date: 11/02/2022   History of Present Illness 72 y/o female presented to ED on 10/29/22 for weakness and abdominal pain. CXR concerned for multifocal pneumonia. Admitted for severe sepsis 2/2 PNA. PMH: bipolar affective disorder, schizophrenia, depression    PT Comments  Pt  in bed.  Encouragement to participate.  She is able to get to EOB with min a x 1.  Steady in sitting.  Stands with min a x 1 HHA and walks to end of bed before turning around to return to supine.  Knees buckling at times but she is able to self correct.  Pt remains fearful of falling.  Pt is not progressing as expected with mobility.  Adult home needs pt ambulatory prior to return.  At this point SNF is appropriate to increase mobility and safety for a successful discharge plan and adjust discharge disposition as appropriate from that level.    Assistance Recommended at Discharge Intermittent Supervision/Assistance  If plan is discharge home, recommend the following:  Can travel by private vehicle    A little help with walking and/or transfers;A little help with bathing/dressing/bathroom;Assistance with cooking/housework;Assist for transportation;Help with stairs or ramp for entrance;Direct supervision/assist for medications management;Direct supervision/assist for financial management      Equipment Recommendations       Recommendations for Other Services       Precautions / Restrictions Precautions Precautions: Fall Restrictions Weight Bearing Restrictions: No     Mobility  Bed Mobility Overal bed mobility: Needs Assistance Bed Mobility: Supine to Sit, Sit to Supine     Supine to sit: Min assist Sit to supine: Min guard     Patient Response: Flat affect  Transfers Overall transfer level: Needs assistance Equipment used: 1 person hand held assist Transfers: Sit to/from Stand Sit to Stand:  Min assist                Ambulation/Gait Ambulation/Gait assistance: Min Chemical engineer (Feet): 10 Feet Assistive device: 1 person hand held assist Gait Pattern/deviations: Step-through pattern, Decreased step length - right, Decreased step length - left Gait velocity: decreased     General Gait Details: short shuffling steps voicing fear of falling and wanting to sit back down, needing encouragement to walk   Stairs             Wheelchair Mobility     Tilt Bed Tilt Bed Patient Response: Flat affect  Modified Rankin (Stroke Patients Only)       Balance Overall balance assessment: Needs assistance Sitting-balance support: No upper extremity supported, Feet supported Sitting balance-Leahy Scale: Good     Standing balance support: Single extremity supported Standing balance-Leahy Scale: Fair Standing balance comment: +1 hands on with dynamic tasks,                            Cognition Arousal/Alertness: Awake/alert Behavior During Therapy: WFL for tasks assessed/performed, Anxious Overall Cognitive Status: No family/caregiver present to determine baseline cognitive functioning                                          Exercises      General Comments        Pertinent Vitals/Pain Pain Assessment Pain Assessment: No/denies pain    Home Living  Prior Function            PT Goals (current goals can now be found in the care plan section) Progress towards PT goals: Not progressing toward goals - comment    Frequency    Min 2X/week      PT Plan Discharge plan needs to be updated    Co-evaluation              AM-PAC PT "6 Clicks" Mobility   Outcome Measure  Help needed turning from your back to your side while in a flat bed without using bedrails?: A Little Help needed moving from lying on your back to sitting on the side of a flat bed without using bedrails?: A  Little Help needed moving to and from a bed to a chair (including a wheelchair)?: A Little Help needed standing up from a chair using your arms (e.g., wheelchair or bedside chair)?: A Little Help needed to walk in hospital room?: A Little Help needed climbing 3-5 steps with a railing? : A Lot 6 Click Score: 17    End of Session Equipment Utilized During Treatment: Gait belt Activity Tolerance: Patient tolerated treatment well Patient left: in bed;with call bell/phone within reach;with bed alarm set;with nursing/sitter in room Nurse Communication: Mobility status PT Visit Diagnosis: Unsteadiness on feet (R26.81);Muscle weakness (generalized) (M62.81);Other abnormalities of gait and mobility (R26.89);History of falling (Z91.81)     Time: 1610-9604 PT Time Calculation (min) (ACUTE ONLY): 8 min  Charges:    $Gait Training: 8-22 mins PT General Charges $$ ACUTE PT VISIT: 1 Visit                   Danielle Dess, PTA 11/02/22, 9:18 AM
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
## 2022-11-03 DIAGNOSIS — J189 Pneumonia, unspecified organism: Secondary | ICD-10-CM | POA: Diagnosis not present

## 2022-11-03 DIAGNOSIS — R531 Weakness: Secondary | ICD-10-CM | POA: Diagnosis not present

## 2022-11-03 DIAGNOSIS — A419 Sepsis, unspecified organism: Secondary | ICD-10-CM | POA: Diagnosis not present

## 2022-11-03 LAB — CULTURE, BLOOD (SINGLE)
Culture: NO GROWTH
Culture: NO GROWTH

## 2022-11-03 MED ORDER — DM-GUAIFENESIN ER 30-600 MG PO TB12: 1.0000 | ORAL_TABLET | Freq: Two times a day (BID) | ORAL | Status: DC | PRN

## 2022-11-03 NOTE — Plan of Care (Signed)

## 2022-11-03 NOTE — Progress Notes (Signed)
Occupational Therapy Treatment Patient Details Name: Selena Pittman MRN: [id number] DOB: Jul 10, 1950 Today's Date: 11/03/2022   History of present illness 72 y/o female presented to ED on 10/29/22 for weakness and abdominal pain. CXR concerned for multifocal pneumonia. Admitted for severe sepsis 2/2 PNA. PMH: bipolar affective disorder, schizophrenia, depression   OT comments  Selena Pittman was seen for OT treatment on this date. Upon arrival to room pt supine in bed, agreeable to OT Tx session with gentle encouragement. OT facilitated ADL management with education and assist as described below. See ADL section for additional details regarding occupational performance. Pt continues to be functionally limited by by decreased cognition, decreased balance, & limited activity tolerance. She requires MIN A for step pivot transfers and peri-care management. Pt return verbalizes understanding of education provided t/o session. Pt is progressing toward OT goals and continues to benefit from skilled OT services to maximize return to PLOF and minimize risk of future falls, injury, caregiver burden, and readmission. Will continue to follow POC as written. Discharge recommendation updated. Anticipate pt will benefit from ongoing therapy services upon acute hospital DC  (< 3 hours/day) as she is unable to engage in functional activity at her baseline level.     Recommendations for follow up therapy are one component of a multi-disciplinary discharge planning process, led by the attending physician.  Recommendations may be updated based on patient status, additional functional criteria and insurance authorization.    Assistance Recommended at Discharge Frequent or constant Supervision/Assistance  Patient can return home with the following  A little help with walking and/or transfers;A little help with bathing/dressing/bathroom   Equipment Recommendations       Recommendations for Other Services       Precautions / Restrictions Precautions Precautions: Fall Restrictions Weight Bearing Restrictions: No       Mobility Bed Mobility Overal bed mobility: Needs Assistance Bed Mobility: Supine to Sit, Sit to Supine     Supine to sit: Min assist Sit to supine: Supervision, HOB elevated        Transfers Overall transfer level: Needs assistance Equipment used: 1 person hand held assist Transfers: Sit to/from Stand Sit to Stand: Min assist           General transfer comment: HHA to support balance during step pivot to [facility]     Balance Overall balance assessment: Needs assistance Sitting-balance support: No upper extremity supported, Feet supported Sitting balance-Leahy Scale: Good Sitting balance - Comments: steady static sitting at EOB   Standing balance support: Single extremity supported Standing balance-Leahy Scale: Fair Standing balance comment: +1 hands on with dynamic tasks,                           ADL either performed or assessed with clinical judgement   ADL Overall ADL's : Needs assistance/impaired Eating/Feeding: Set up;Sitting;Supervision/ safety   Grooming: Wash/dry hands;Minimal assistance Grooming Details (indicate cue type and reason): MIN A to perform HH after toileting. Pt requires HOH assist for thoroughness when using sanitizer foam.                 Toilet Transfer: BSC/3in1;Minimal assistance Toilet Transfer Details (indicate cue type and reason): +1 HHA to step pivot EOB>BSC>EOB Toileting- Clothing Manipulation and Hygiene: Sit to/from stand;Minimal assistance Toileting - Clothing Manipulation Details (indicate cue type and reason): Pt able to perform wiping after BM on BSC with SET UP assist and 1 HHA for standing balance. No physical assist required for  peri-care this date.     Functional mobility during ADLs: Minimal assistance      Extremity/Trunk Assessment              Vision Patient Visual Report: No change  from baseline     Perception     Praxis      Cognition Arousal/Alertness: Awake/alert Behavior During Therapy: WFL for tasks assessed/performed, Anxious Overall Cognitive Status: No family/caregiver present to determine baseline cognitive functioning Area of Impairment: Orientation, Attention, Memory, Following commands, Safety/judgement, Awareness                 Orientation Level: Disoriented to, Time, Situation Current Attention Level: Sustained Memory: Decreased short-term memory Following Commands: Follows one step commands with increased time Safety/Judgement: Decreased awareness of deficits Awareness: Intellectual   General Comments: Requires gentle encouragement to participate in therapy, pleasantly confused t/o session.        Exercises Other Exercises Other Exercises: OT facilitated ADL management with education and physical assist as described.    Shoulder Instructions       General Comments      Pertinent Vitals/ Pain       Pain Assessment Pain Location: abdominal but does not grimmace with mobility and overall seems generally comfortable with no c/o with mobility but does voice pain when asked Pain Descriptors / Indicators: Discomfort Pain Intervention(s): Limited activity within patient's tolerance, Repositioned  Home Living                                          Prior Functioning/Environment              Frequency  Min 1X/week        Progress Toward Goals  OT Goals(current goals can now be found in the care plan section)  Progress towards OT goals: Progressing toward goals  Acute Rehab OT Goals Patient Stated Goal: to feel better OT Goal Formulation: With patient Time For Goal Achievement: 11/13/22 Potential to Achieve Goals: Good  Plan Discharge plan needs to be updated    Co-evaluation                 AM-PAC OT "6 Clicks" Daily Activity     Outcome Measure   Help from another person eating  meals?: None Help from another person taking care of personal grooming?: A Little Help from another person toileting, which includes using toliet, bedpan, or urinal?: A Little Help from another person bathing (including washing, rinsing, drying)?: A Lot Help from another person to put on and taking off regular upper body clothing?: A Little Help from another person to put on and taking off regular lower body clothing?: A Lot 6 Click Score: 17    End of Session Equipment Utilized During Treatment: Gait belt  OT Visit Diagnosis: Other abnormalities of gait and mobility (R26.89)   Activity Tolerance Patient tolerated treatment well   Patient Left with call bell/phone within reach;in bed;with bed alarm set   Nurse Communication          Time: 1050-1107 OT Time Calculation (min): 17 min  Charges: OT General Charges $OT Visit: 1 Visit OT Treatments $Self Care/Home Management : 8-22 mins  Rockney Ghee, M.S., OTR/L 11/03/22, 12:15 PM
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
## 2022-11-03 NOTE — NC FL2 (Addendum)
[place] MEDICAID FL2 LEVEL OF CARE FORM     IDENTIFICATION  Patient Name: Selena Pittman Birthdate: [birth year] Sex: female Admission Date (Current Location): 10/29/2022  [facility] and [place] Number:  Chiropodist and Address:  [facility], [address] [place], [place] [address]      Provider Number: [id number]  Attending Physician Name and Address:  Pennie Banter, DO  Relative Name and Phone Number:  Darly, Massi)  [phone]    Current Level of Care: Hospital Recommended Level of Care: Skilled Nursing Facility Prior Approval Number:    Date Approved/Denied:   PASRR Number: [id number]  Discharge Plan: SNF    Current Diagnoses: Patient Active Problem List   Diagnosis Date Noted   Acute distention of stomach 11/02/2022   Chronic abdominal pain 11/01/2022   Multifocal pneumonia 10/29/2022   Bipolar affective disorder (HCC) 10/29/2022   Hyperlipidemia 10/29/2022   IBS (irritable colon syndrome) 10/29/2022   Generalized weakness 04/12/2022   Acute cystitis without hematuria 04/09/2022   Necrotizing pneumonia (HCC) 04/06/2022   Tobacco use 04/02/2022   GERD (gastroesophageal reflux disease) 04/02/2022   Schizophrenia (HCC) 04/02/2022   Leukocytosis 04/02/2022   Hypokalemia 04/02/2022   Lung mass 04/02/2022   Malignant neoplasm of left female breast (HCC) 06/05/2015    Orientation RESPIRATION BLADDER Height & Weight     Self, Time, Place  Normal Continent Weight: 117 lb 1 oz (53.1 kg) Height:  5\' 4"  (162.6 cm)  BEHAVIORAL SYMPTOMS/MOOD NEUROLOGICAL BOWEL NUTRITION STATUS      Continent    AMBULATORY STATUS COMMUNICATION OF NEEDS Skin   Limited Assist Verbally Normal                       Personal Care Assistance Level of Assistance  Bathing, Feeding, Dressing Bathing Assistance: Limited assistance Feeding assistance: Limited assistance Dressing Assistance: Limited assistance     Functional  Limitations Info  Sight, Hearing, Speech Sight Info: Adequate Hearing Info: Adequate Speech Info: Adequate    SPECIAL CARE FACTORS FREQUENCY  PT (By licensed PT), OT (By licensed OT)     PT Frequency: 5 times a week OT Frequency: 5 times a week            Contractures Contractures Info: Not present    Additional Factors Info  Code Status, Isolation Precautions Code Status Info: FULL       Isolation Precautions Info: MRSA     Current Medications (11/03/2022):  This is the current hospital active medication list Current Facility-Administered Medications  Medication Dose Route Frequency Provider Last Rate Last Admin   acetaminophen (TYLENOL) tablet 650 mg  650 mg Oral Q6H PRN Esaw Grandchild A, DO   650 mg at 11/03/22 4034   Or   acetaminophen (TYLENOL) suppository 650 mg  650 mg Rectal Q6H PRN Esaw Grandchild A, DO       albuterol (PROVENTIL) (2.5 MG/3ML) 0.083% nebulizer solution 3 mL  3 mL Inhalation Q4H PRN Esaw Grandchild A, DO       cholecalciferol (VITAMIN D3) 25 MCG (1000 UNIT) tablet 2,000 Units  2,000 Units Oral Daily Esaw Grandchild A, DO   2,000 Units at 11/03/22 0840   cloZAPine (CLOZARIL) tablet 200 mg  200 mg Oral QHS Esaw Grandchild A, DO   200 mg at 11/02/22 2216   dextromethorphan-guaiFENesin (MUCINEX DM) 30-600 MG per 12 hr tablet 1 tablet  1 tablet Oral BID PRN Pennie Banter, DO  dicyclomine (BENTYL) 10 MG/5ML solution 10 mg  10 mg Oral TID AC Griffith, Kelly A, DO   10 mg at 11/03/22 0839   docusate sodium (COLACE) capsule 100 mg  100 mg Oral BID PRN Esaw Grandchild A, DO       enoxaparin (LOVENOX) injection 40 mg  40 mg Subcutaneous Q24H Esaw Grandchild A, DO   40 mg at 11/02/22 1149   fluvoxaMINE (LUVOX) tablet 50 mg  50 mg Oral BID Esaw Grandchild A, DO   50 mg at 11/03/22 0840   glycerin adult 1 suppository  1 suppository Rectal PRN Esaw Grandchild A, DO       hydrOXYzine (ATARAX) tablet 25 mg  25 mg Oral BID Esaw Grandchild A, DO   25 mg at  11/03/22 0839   letrozole [facility]) tablet 2.5 mg  2.5 mg Oral Daily Esaw Grandchild A, DO   2.5 mg at 11/03/22 0841   linaclotide (LINZESS) capsule 145 mcg  145 mcg Oral Daily Esaw Grandchild A, DO   145 mcg at 11/03/22 1610   liver oil-zinc oxide (DESITIN) 40 % ointment   Topical PRN Esaw Grandchild A, DO       loratadine (CLARITIN) tablet 10 mg  10 mg Oral Daily Esaw Grandchild A, DO   10 mg at 11/03/22 0840   metoCLOPramide (REGLAN) tablet 10 mg  10 mg Oral TID AC & HS Esaw Grandchild A, DO   10 mg at 11/03/22 0840   montelukast (SINGULAIR) tablet 5 mg  5 mg Oral QHS Esaw Grandchild A, DO   5 mg at 11/02/22 2207   ondansetron (ZOFRAN) tablet 4 mg  4 mg Oral Q6H PRN Esaw Grandchild A, DO   4 mg at 11/02/22 0950   Or   ondansetron (ZOFRAN) injection 4 mg  4 mg Intravenous Q6H PRN Esaw Grandchild A, DO       pantoprazole (PROTONIX) EC tablet 40 mg  40 mg Oral Daily Esaw Grandchild A, DO   40 mg at 11/03/22 0841   polyethylene glycol (MIRALAX / GLYCOLAX) packet 17 g  17 g Oral Daily PRN Esaw Grandchild A, DO       simvastatin (ZOCOR) tablet 20 mg  20 mg Oral QHS Esaw Grandchild A, DO   20 mg at 11/02/22 2207     Discharge Medications: Please see discharge summary for a list of discharge medications.  STOP taking these medications     HYDROcodone-acetaminophen 5-325 MG tablet Commonly known as: Norco    lactulose 10 g packet Commonly known as: CEPHULAC           TAKE these medications     acetaminophen 325 MG tablet Commonly known as: TYLENOL Take 2 tablets (650 mg total) by mouth every 6 (six) hours as needed for mild pain, fever or headache.    alendronate 70 MG tablet Commonly known as: FOSAMAX Take 1 tablet (70 mg total) by mouth once a week. Take with a full glass of water on an empty stomach.    cetirizine 10 MG tablet Commonly known as: ZYRTEC Take 1 tablet (10 mg total) by mouth daily.    cholecalciferol 1000 units tablet Commonly known as: VITAMIN D Take 2 tablets  (2,000 Units total) by mouth daily.    cloZAPine 100 MG tablet Commonly known as: CLOZARIL Take 2 tablets (200 mg total) by mouth at bedtime.    coal tar-salicylic acid 2 % shampoo Use to wash scalp Tuesday, Thursday and Saturdays.    dicyclomine 10  MG/5ML solution Commonly known as: BENTYL Take 5 mLs (10 mg total) by mouth 3 (three) times daily as needed (abdominal pain / cramping).    docusate sodium 100 MG capsule Commonly known as: COLACE Take 1 capsule (100 mg total) by mouth 2 (two) times daily.    fluvoxaMINE 50 MG tablet Commonly known as: LUVOX Take 1 tablet (50 mg total) by mouth 2 (two) times daily.    glycerin adult 2 g suppository Place 1 suppository rectally as needed for constipation.    hydrOXYzine 25 MG tablet Commonly known as: ATARAX Take 1 tablet (25 mg total) by mouth 2 (two) times daily.    ketoconazole 2 % shampoo Commonly known as: NIZORAL APPLY TO AFFECTED AREA TWICE A WEEK FOR 8 WEEKS; THEN USE AS NEEDED.    letrozole 2.5 MG tablet Commonly known as: FEMARA Take 1 tablet (2.5 mg total) by mouth daily.    Linzess 145 MCG Caps capsule Generic drug: linaclotide Take 1 capsule (145 mcg total) by mouth daily.    liver oil-zinc oxide 40 % ointment Commonly known as: DESITIN Apply topically as needed for irritation.    metoCLOPramide 10 MG tablet Commonly known as: REGLAN Take 1 tablet (10 mg total) by mouth 4 (four) times daily -  before meals and at bedtime for 10 days.    montelukast 5 MG chewable tablet Commonly known as: SINGULAIR Chew 1 tablet (5 mg total) by mouth at bedtime.    omeprazole 20 MG capsule Commonly known as: PRILOSEC Take 1 capsule (20 mg total) by mouth daily.    polyethylene glycol 17 g packet Commonly known as: MIRALAX / GLYCOLAX Take 17 g by mouth daily. What changed: Another medication with the same name was removed. Continue taking this medication, and follow the directions you see here.    simvastatin 20 MG  tablet Commonly known as: ZOCOR Take 1 tablet (20 mg total) by mouth daily.         Relevant Imaging Results:  Relevant Lab Results:   Additional Information SS [id number]  Allena Katz, LCSW

## 2022-11-03 NOTE — Care Management Important Message (Signed)
Important Message  Patient Details  Name: Selena Pittman MRN: [id number] Date of Birth: 10/10/1950   Medicare Important Message Given:  N/A - LOS <3 / Initial given by admissions     Olegario Messier A Nkechi Linehan 11/03/2022, 9:23 AM

## 2022-11-03 NOTE — TOC Progression Note (Signed)
Transition of Care [facility]) - Progression Note    Patient Details  Name: Selena Pittman MRN: [id number] Date of Birth: 05-20-1950  Transition of Care [facility]) CM/SW Contact  Allena Katz, LCSW Phone Number: 11/03/2022, 11:54 AM  Clinical Narrative:   CSW spoke with son who reports he would like [facility]. Csw waiitng for Level 2 passr for pt.         Expected Discharge Plan and Services         Expected Discharge Date: 10/31/22                                     Social Determinants of Health (SDOH) Interventions SDOH Screenings   Food Insecurity: No Food Insecurity (10/29/2022)  Housing: Low Risk  (10/29/2022)  Transportation Needs: No Transportation Needs (10/29/2022)  Utilities: Not At Risk (10/29/2022)  Tobacco Use: High Risk (10/29/2022)    Readmission Risk Interventions     No data to display

## 2022-11-03 NOTE — TOC PASRR Note (Signed)
RE: Selena Pittman Date of Birth: [birth year]  Date: 11/03/2022     To Whom It May Concern:   Please be advised that the above-named patient will require a short-term nursing home stay - anticipated 30 days or less for rehabilitation and strengthening.  The plan is for return home

## 2022-11-04 DIAGNOSIS — J189 Pneumonia, unspecified organism: Secondary | ICD-10-CM | POA: Diagnosis not present

## 2022-11-04 MED ORDER — LETROZOLE 2.5 MG PO TABS
2.5000 mg | ORAL_TABLET | Freq: Every day | ORAL | Status: DC
Start: 2022-11-04 — End: 2023-10-15

## 2022-11-04 MED ORDER — GLYCERIN (ADULT) 2 G RE SUPP: 1.0000 | RECTAL | Status: DC | PRN

## 2022-11-04 MED ORDER — KETOCONAZOLE 2 % EX SHAM
MEDICATED_SHAMPOO | CUTANEOUS | Status: DC
Start: 2022-11-04 — End: 2023-10-15

## 2022-11-04 MED ORDER — VITAMIN D 1000 UNITS PO TABS: 2000.0000 [IU] | ORAL_TABLET | Freq: Every day | ORAL | Status: DC

## 2022-11-04 MED ORDER — CLOZAPINE 100 MG PO TABS: 200.0000 mg | ORAL_TABLET | Freq: Every day | ORAL | Status: DC

## 2022-11-04 MED ORDER — LINZESS 145 MCG PO CAPS: 145.0000 ug | ORAL_CAPSULE | Freq: Every day | ORAL | Status: DC

## 2022-11-04 MED ORDER — FLUVOXAMINE MALEATE 50 MG PO TABS: 50.0000 mg | ORAL_TABLET | Freq: Two times a day (BID) | ORAL | Status: DC

## 2022-11-04 MED ORDER — MONTELUKAST SODIUM 5 MG PO CHEW: 5.0000 mg | CHEWABLE_TABLET | Freq: Every day | ORAL | Status: DC

## 2022-11-04 MED ORDER — DICYCLOMINE HCL 10 MG/5ML PO SOLN: 10.0000 mg | Freq: Three times a day (TID) | ORAL | Status: DC | PRN

## 2022-11-04 MED ORDER — OMEPRAZOLE 20 MG PO CPDR: 20.0000 mg | DELAYED_RELEASE_CAPSULE | Freq: Every day | ORAL | Status: DC

## 2022-11-04 MED ORDER — DOCUSATE SODIUM 100 MG PO CAPS: 100.0000 mg | ORAL_CAPSULE | Freq: Two times a day (BID) | ORAL | Status: DC

## 2022-11-04 MED ORDER — HYDROXYZINE HCL 25 MG PO TABS: 25.0000 mg | ORAL_TABLET | Freq: Two times a day (BID) | ORAL | Status: DC

## 2022-11-04 MED ORDER — SALICYLIC ACID 2 % EX SHAM
MEDICATED_SHAMPOO | CUTANEOUS | Status: DC
Start: 2022-11-04 — End: 2023-10-15

## 2022-11-04 MED ORDER — POLYETHYLENE GLYCOL 3350 17 G PO PACK: 17.0000 g | PACK | Freq: Every day | ORAL | Status: DC

## 2022-11-04 MED ORDER — CETIRIZINE HCL 10 MG PO TABS: 10.0000 mg | ORAL_TABLET | Freq: Every day | ORAL | Status: DC

## 2022-11-04 MED ORDER — SIMVASTATIN 20 MG PO TABS: 20.0000 mg | ORAL_TABLET | Freq: Every day | ORAL | Status: DC

## 2022-11-04 MED ORDER — ZINC OXIDE 40 % EX OINT: TOPICAL_OINTMENT | CUTANEOUS | Status: DC | PRN

## 2022-11-04 MED ORDER — ALENDRONATE SODIUM 70 MG PO TABS: 70.0000 mg | ORAL_TABLET | ORAL | Status: DC

## 2022-11-04 MED ORDER — METOCLOPRAMIDE HCL 10 MG PO TABS: 10.0000 mg | ORAL_TABLET | Freq: Three times a day (TID) | ORAL | Status: DC

## 2022-11-04 MED ORDER — ACETAMINOPHEN 325 MG PO TABS: 650.0000 mg | ORAL_TABLET | Freq: Four times a day (QID) | ORAL | Status: DC | PRN

## 2022-11-04 NOTE — Care Management Important Message (Signed)
Important Message  Patient Details  Name: Estefanie Cornforth MRN: [id number] Date of Birth: 1950-08-10   Medicare Important Message Given:  Yes  Legal guardian, Zahria Ding returned my call and I reviewed the Important Message from Medicare with him. He is in agreement with this mother's discharge today. I wished her well and thanked him for his time.   Olegario Messier A Arian Mcquitty 11/04/2022, 10:30 AM

## 2022-11-04 NOTE — Plan of Care (Signed)
  Problem: Acute Rehab PT Goals(only PT should resolve) Goal: Pt Will Go Supine/Side To Sit Outcome: Completed/Met Goal: Patient Will Transfer Sit To/From Stand Outcome: Completed/Met Goal: Pt Will Ambulate Outcome: Completed/Met Goal: Pt Will Go Up/Down Stairs Outcome: Completed/Met

## 2022-11-04 NOTE — Plan of Care (Signed)

## 2022-11-04 NOTE — TOC Transition Note (Addendum)
Transition of Care [facility]) - CM/SW Discharge Note   Patient Details  Name: Selena Pittman MRN: [id number] Date of Birth: 1950-05-13  Transition of Care [facility]) CM/SW Contact:  Allena Katz, LCSW Phone Number: 11/04/2022, 10:07 AM   Clinical Narrative:  Pt discharging to [facility]. Tanya notified. RN given number for report. Medical necessity printed to unit. Guardian to be notified. DC summary to be sent once in and acems to be called.  10:22am  VM left with guardian Viviann Spare    Final next level of care: Skilled Nursing Facility Barriers to Discharge: Barriers Resolved   Patient Goals and CMS Choice   Choice offered to / list presented to :  (PT GUARDIAN)  Discharge Placement PASRR number recieved: 11/03/22 PASRR number recieved: 11/03/22            Patient chooses bed at: [facility] Patient to be transferred to facility by: [facility] Name of family member notified: Guardian Patient and family notified of of transfer: 11/04/22  Discharge Plan and Services Additional resources added to the After Visit Summary for                                       Social Determinants of Health (SDOH) Interventions SDOH Screenings   Food Insecurity: No Food Insecurity (10/29/2022)  Housing: Low Risk  (10/29/2022)  Transportation Needs: No Transportation Needs (10/29/2022)  Utilities: Not At Risk (10/29/2022)  Tobacco Use: High Risk (10/29/2022)     Readmission Risk Interventions     No data to display

## 2022-11-04 NOTE — Progress Notes (Signed)
Update the patient son about the discharge panning. No question and concern at this time.
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
## 2022-11-04 NOTE — Progress Notes (Signed)
Patient report given to assigned nurse Okey Regal. All questions are addressed via phone.

## 2022-11-04 NOTE — Care Management Important Message (Signed)
Important Message  Patient Details  Name: Nahal Wanless MRN: [id number] Date of Birth: 11/01/1950   Medicare Important Message Given:  Other (see comment)  Left a message with legal guardian, Locklyn Henriquez, son [phone]) to review the Important Message from Medicare. Will await a return call.    Olegario Messier A Lashanda Storlie 11/04/2022, 10:22 AM

## 2023-07-27 ENCOUNTER — Other Ambulatory Visit: Payer: Self-pay

## 2023-07-27 ENCOUNTER — Emergency Department

## 2023-07-27 ENCOUNTER — Inpatient Hospital Stay
Admission: EM | Admit: 2023-07-27 | Discharge: 2023-08-03 | DRG: 871 | Disposition: A | Discharge status: Skilled Nursing Facility | Source: Skilled Nursing Facility | Attending: Internal Medicine | Admitting: Internal Medicine

## 2023-07-27 DIAGNOSIS — F17210 Nicotine dependence, cigarettes, uncomplicated: Secondary | ICD-10-CM | POA: Diagnosis not present

## 2023-07-27 DIAGNOSIS — I2510 Atherosclerotic heart disease of native coronary artery without angina pectoris: Secondary | ICD-10-CM | POA: Diagnosis present

## 2023-07-27 DIAGNOSIS — F259 Schizoaffective disorder, unspecified: Secondary | ICD-10-CM | POA: Diagnosis not present

## 2023-07-27 DIAGNOSIS — E440 Moderate protein-calorie malnutrition: Secondary | ICD-10-CM | POA: Diagnosis present

## 2023-07-27 DIAGNOSIS — Z1612 Extended spectrum beta lactamase (ESBL) resistance: Secondary | ICD-10-CM | POA: Diagnosis present

## 2023-07-27 DIAGNOSIS — R4182 Altered mental status, unspecified: Secondary | ICD-10-CM | POA: Diagnosis present

## 2023-07-27 DIAGNOSIS — E871 Hypo-osmolality and hyponatremia: Secondary | ICD-10-CM | POA: Diagnosis not present

## 2023-07-27 DIAGNOSIS — Z681 Body mass index (BMI) 19 or less, adult: Secondary | ICD-10-CM

## 2023-07-27 DIAGNOSIS — Z8249 Family history of ischemic heart disease and other diseases of the circulatory system: Secondary | ICD-10-CM

## 2023-07-27 DIAGNOSIS — E872 Acidosis, unspecified: Secondary | ICD-10-CM | POA: Diagnosis not present

## 2023-07-27 DIAGNOSIS — F419 Anxiety disorder, unspecified: Secondary | ICD-10-CM | POA: Diagnosis not present

## 2023-07-27 DIAGNOSIS — K5900 Constipation, unspecified: Secondary | ICD-10-CM | POA: Insufficient documentation

## 2023-07-27 DIAGNOSIS — R636 Underweight: Secondary | ICD-10-CM

## 2023-07-27 DIAGNOSIS — R5381 Other malaise: Secondary | ICD-10-CM | POA: Diagnosis present

## 2023-07-27 DIAGNOSIS — R197 Diarrhea, unspecified: Secondary | ICD-10-CM

## 2023-07-27 DIAGNOSIS — R6521 Severe sepsis with septic shock: Secondary | ICD-10-CM | POA: Diagnosis not present

## 2023-07-27 DIAGNOSIS — J439 Emphysema, unspecified: Secondary | ICD-10-CM | POA: Diagnosis present

## 2023-07-27 DIAGNOSIS — Z79811 Long term (current) use of aromatase inhibitors: Secondary | ICD-10-CM | POA: Diagnosis not present

## 2023-07-27 DIAGNOSIS — R404 Transient alteration of awareness: Secondary | ICD-10-CM

## 2023-07-27 DIAGNOSIS — N390 Urinary tract infection, site not specified: Secondary | ICD-10-CM | POA: Diagnosis not present

## 2023-07-27 DIAGNOSIS — E878 Other disorders of electrolyte and fluid balance, not elsewhere classified: Secondary | ICD-10-CM

## 2023-07-27 DIAGNOSIS — R109 Unspecified abdominal pain: Secondary | ICD-10-CM | POA: Diagnosis present

## 2023-07-27 DIAGNOSIS — Z853 Personal history of malignant neoplasm of breast: Secondary | ICD-10-CM

## 2023-07-27 DIAGNOSIS — F319 Bipolar disorder, unspecified: Secondary | ICD-10-CM | POA: Diagnosis not present

## 2023-07-27 DIAGNOSIS — E039 Hypothyroidism, unspecified: Secondary | ICD-10-CM | POA: Diagnosis not present

## 2023-07-27 DIAGNOSIS — A419 Sepsis, unspecified organism: Principal | ICD-10-CM | POA: Diagnosis present

## 2023-07-27 DIAGNOSIS — B9620 Unspecified Escherichia coli [E. coli] as the cause of diseases classified elsewhere: Secondary | ICD-10-CM | POA: Diagnosis present

## 2023-07-27 DIAGNOSIS — Z9012 Acquired absence of left breast and nipple: Secondary | ICD-10-CM | POA: Diagnosis not present

## 2023-07-27 DIAGNOSIS — A498 Other bacterial infections of unspecified site: Secondary | ICD-10-CM

## 2023-07-27 DIAGNOSIS — Z7983 Long term (current) use of bisphosphonates: Secondary | ICD-10-CM

## 2023-07-27 DIAGNOSIS — E8809 Other disorders of plasma-protein metabolism, not elsewhere classified: Secondary | ICD-10-CM | POA: Diagnosis not present

## 2023-07-27 DIAGNOSIS — N3001 Acute cystitis with hematuria: Secondary | ICD-10-CM | POA: Diagnosis present

## 2023-07-27 DIAGNOSIS — E739 Lactose intolerance, unspecified: Secondary | ICD-10-CM | POA: Diagnosis present

## 2023-07-27 DIAGNOSIS — G9341 Metabolic encephalopathy: Secondary | ICD-10-CM | POA: Diagnosis present

## 2023-07-27 DIAGNOSIS — E876 Hypokalemia: Secondary | ICD-10-CM | POA: Diagnosis present

## 2023-07-27 DIAGNOSIS — Z79899 Other long term (current) drug therapy: Secondary | ICD-10-CM

## 2023-07-27 DIAGNOSIS — E43 Unspecified severe protein-calorie malnutrition: Secondary | ICD-10-CM | POA: Insufficient documentation

## 2023-07-27 DIAGNOSIS — R9431 Abnormal electrocardiogram [ECG] [EKG]: Secondary | ICD-10-CM

## 2023-07-27 DIAGNOSIS — Z91010 Allergy to peanuts: Secondary | ICD-10-CM

## 2023-07-27 DIAGNOSIS — E785 Hyperlipidemia, unspecified: Secondary | ICD-10-CM | POA: Diagnosis not present

## 2023-07-27 DIAGNOSIS — K5909 Other constipation: Secondary | ICD-10-CM | POA: Diagnosis present

## 2023-07-27 DIAGNOSIS — Z90710 Acquired absence of both cervix and uterus: Secondary | ICD-10-CM

## 2023-07-27 LAB — URINALYSIS, ROUTINE W REFLEX MICROSCOPIC
Bilirubin Urine: NEGATIVE
Glucose, UA: NEGATIVE mg/dL
Hgb urine dipstick: NEGATIVE
Ketones, ur: 20 mg/dL — AB
Nitrite: NEGATIVE
Protein, ur: 30 mg/dL — AB
Specific Gravity, Urine: 1.019 (ref 1.005–1.030)
Squamous Epithelial / HPF: 0 /HPF (ref 0–5)
WBC, UA: >50 WBC/hpf (ref 0–5)
pH: 5 (ref 5.0–8.0)

## 2023-07-27 LAB — BASIC METABOLIC PANEL WITH GFR
Anion gap: 12 (ref 5–15)
BUN: 10 mg/dL (ref 8–23)
CO2: 22 mmol/L (ref 22–32)
Calcium: 8.1 mg/dL — ABNORMAL LOW (ref 8.9–10.3)
Chloride: 103 mmol/L (ref 98–111)
Creatinine, Ser: 0.36 mg/dL — ABNORMAL LOW (ref 0.44–1.00)
GFR, Estimated: >60 mL/min (ref >60)
Glucose, Bld: 103 mg/dL — ABNORMAL HIGH (ref 70–99)
Potassium: 2.3 mmol/L — CL (ref 3.5–5.1)
Sodium: 137 mmol/L (ref 135–145)

## 2023-07-27 LAB — CBC
HCT: 35.6 % — ABNORMAL LOW (ref 36.0–46.0)
Hemoglobin: 13.2 g/dL (ref 12.0–15.0)
MCH: 30.9 pg (ref 26.0–34.0)
MCHC: 37.1 g/dL — ABNORMAL HIGH (ref 30.0–36.0)
MCV: 83.4 fL (ref 80.0–100.0)
Platelets: 230 10*3/uL (ref 150–400)
RBC: 4.27 MIL/uL (ref 3.87–5.11)
RDW: 15.7 % — ABNORMAL HIGH (ref 11.5–15.5)
WBC: 7.9 10*3/uL (ref 4.0–10.5)
nRBC: 0 % (ref 0.0–0.2)

## 2023-07-27 LAB — LACTIC ACID, PLASMA: Lactic Acid, Venous: 0.7 mmol/L (ref 0.5–1.9)

## 2023-07-27 LAB — TROPONIN I (HIGH SENSITIVITY): Troponin I (High Sensitivity): 10 ng/L (ref <18)

## 2023-07-27 LAB — MAGNESIUM: Magnesium: 1.7 mg/dL (ref 1.7–2.4)

## 2023-07-27 LAB — POTASSIUM: Potassium: 3.3 mmol/L — ABNORMAL LOW (ref 3.5–5.1)

## 2023-07-27 LAB — PHOSPHORUS: Phosphorus: 1.4 mg/dL — ABNORMAL LOW (ref 2.5–4.6)

## 2023-07-27 MED ORDER — LETROZOLE 2.5 MG PO TABS
2.5000 mg | ORAL_TABLET | Freq: Every day | ORAL | Status: DC
  Administered 2023-07-28 – 2023-08-03 (×7): 2.5 mg via ORAL
  Filled 2023-07-27 (×7): qty 1

## 2023-07-27 MED ORDER — SODIUM CHLORIDE 0.9 % IV BOLUS
500.0000 mL | Freq: Once | INTRAVENOUS | Status: AC
Start: 2023-07-27 — End: 2023-07-27
  Administered 2023-07-27: 500 mL via INTRAVENOUS

## 2023-07-27 MED ORDER — NOREPINEPHRINE 4 MG/250ML-% IV SOLN
INTRAVENOUS | Status: AC
  Administered 2023-07-27: 5 ug/min via INTRAVENOUS
  Filled 2023-07-27: qty 250

## 2023-07-27 MED ORDER — POTASSIUM CHLORIDE 10 MEQ/100ML IV SOLN
10.0000 meq | Freq: Once | INTRAVENOUS | Status: AC
  Administered 2023-07-27: 10 meq via INTRAVENOUS
  Filled 2023-07-27: qty 100

## 2023-07-27 MED ORDER — SODIUM CHLORIDE 0.9 % IV BOLUS
500.0000 mL | Freq: Once | INTRAVENOUS | Status: AC
  Administered 2023-07-27: 500 mL via INTRAVENOUS

## 2023-07-27 MED ORDER — CLOZAPINE 100 MG PO TABS: 200.0000 mg | ORAL_TABLET | Freq: Every day | ORAL | Status: DC

## 2023-07-27 MED ORDER — METOCLOPRAMIDE HCL 10 MG PO TABS
10.0000 mg | ORAL_TABLET | Freq: Three times a day (TID) | ORAL | Status: DC
Start: 2023-07-27 — End: 2023-07-27

## 2023-07-27 MED ORDER — LORATADINE 10 MG PO TABS
10.0000 mg | ORAL_TABLET | Freq: Every day | ORAL | Status: DC
  Administered 2023-07-28 – 2023-08-03 (×7): 10 mg via ORAL
  Filled 2023-07-27 (×7): qty 1

## 2023-07-27 MED ORDER — NOREPINEPHRINE 4 MG/250ML-% IV SOLN
0.0000 ug/min | INTRAVENOUS | Status: DC
Start: 2023-07-27 — End: 2023-07-28

## 2023-07-27 MED ORDER — ACETAMINOPHEN 325 MG PO TABS
650.0000 mg | ORAL_TABLET | Freq: Four times a day (QID) | ORAL | Status: DC | PRN
  Administered 2023-07-28 – 2023-08-02 (×7): 650 mg via ORAL
  Filled 2023-07-27 (×8): qty 2

## 2023-07-27 MED ORDER — ENOXAPARIN SODIUM 40 MG/0.4ML IJ SOSY
40.0000 mg | PREFILLED_SYRINGE | INTRAMUSCULAR | Status: DC
  Administered 2023-07-27 – 2023-08-02 (×7): 40 mg via SUBCUTANEOUS
  Filled 2023-07-27 (×7): qty 0.4

## 2023-07-27 MED ORDER — CLOZAPINE 100 MG PO TABS
100.0000 mg | ORAL_TABLET | Freq: Two times a day (BID) | ORAL | Status: DC
  Administered 2023-07-27 – 2023-08-03 (×14): 100 mg via ORAL
  Filled 2023-07-27 (×16): qty 1

## 2023-07-27 MED ORDER — SODIUM CHLORIDE 0.9 % IV BOLUS
1000.0000 mL | Freq: Once | INTRAVENOUS | Status: AC
  Administered 2023-07-27: 1000 mL via INTRAVENOUS

## 2023-07-27 MED ORDER — DICYCLOMINE HCL 10 MG/5ML PO SOLN: 10.0000 mg | Freq: Three times a day (TID) | ORAL | Status: DC | PRN

## 2023-07-27 MED ORDER — PANTOPRAZOLE SODIUM 40 MG PO TBEC
40.0000 mg | DELAYED_RELEASE_TABLET | Freq: Every day | ORAL | Status: DC
  Administered 2023-07-27 – 2023-07-29 (×3): 40 mg via ORAL
  Filled 2023-07-27 (×2): qty 1

## 2023-07-27 MED ORDER — MONTELUKAST SODIUM 5 MG PO CHEW
5.0000 mg | CHEWABLE_TABLET | Freq: Every day | ORAL | Status: DC
  Administered 2023-07-28: 5 mg via ORAL
  Filled 2023-07-27 (×2): qty 1

## 2023-07-27 MED ORDER — SIMVASTATIN 20 MG PO TABS
20.0000 mg | ORAL_TABLET | Freq: Every evening | ORAL | Status: DC
  Administered 2023-07-27: 20 mg via ORAL
  Filled 2023-07-27: qty 2

## 2023-07-27 MED ORDER — RISAQUAD PO CAPS
2.0000 | ORAL_CAPSULE | Freq: Three times a day (TID) | ORAL | Status: DC
  Administered 2023-07-27 – 2023-08-03 (×20): 2 via ORAL
  Filled 2023-07-27 (×21): qty 2

## 2023-07-27 MED ORDER — SODIUM CHLORIDE 0.9 % IV SOLN: 1.0000 g | INTRAVENOUS | Status: DC

## 2023-07-27 MED ORDER — MIDODRINE HCL 5 MG PO TABS
10.0000 mg | ORAL_TABLET | Freq: Three times a day (TID) | ORAL | Status: DC
  Administered 2023-07-28 (×3): 10 mg via ORAL
  Filled 2023-07-27 (×3): qty 2

## 2023-07-27 MED ORDER — BACID PO TABS
2.0000 | ORAL_TABLET | Freq: Three times a day (TID) | ORAL | Status: DC
  Filled 2023-07-27 (×2): qty 2

## 2023-07-27 MED ORDER — POTASSIUM CHLORIDE CRYS ER 20 MEQ PO TBCR
40.0000 meq | EXTENDED_RELEASE_TABLET | ORAL | Status: AC
  Administered 2023-07-27 (×4): 40 meq via ORAL
  Filled 2023-07-27 (×4): qty 2

## 2023-07-27 MED ORDER — SODIUM CHLORIDE 0.9 % IV SOLN
1.0000 g | Freq: Once | INTRAVENOUS | Status: AC
  Administered 2023-07-27: 1 g via INTRAVENOUS
  Filled 2023-07-27: qty 10

## 2023-07-27 MED ORDER — FLUVOXAMINE MALEATE 50 MG PO TABS
50.0000 mg | ORAL_TABLET | Freq: Two times a day (BID) | ORAL | Status: DC
  Administered 2023-07-27 – 2023-08-03 (×14): 50 mg via ORAL
  Filled 2023-07-27 (×15): qty 1

## 2023-07-27 NOTE — ED Provider Notes (Signed)
 [facility] Provider Note    Event Date/Time   First MD Initiated Contact with Patient 07/27/23 1254     (approximate)   History   Weakness  Patient is a poor historian HPI  Selena Pittman is a 73 y.o. female with a history of schizophrenia, sepsis who presents with complaints of fatigue, weakness, facility stay the patient seems somewhat lethargic.  Patient describes some dysuria but no abdominal pain, no chest pain, no cough     Physical Exam   Triage Vital Signs: ED Triage Vitals  Encounter Vitals Group     BP 07/27/23 1241 (!) 89/71     Systolic BP Percentile --      Diastolic BP Percentile --      Pulse Rate 07/27/23 1241 99     Resp 07/27/23 1241 16     Temp 07/27/23 1241 97.8 F (36.6 C)     Temp Source 07/27/23 1241 Oral     SpO2 07/27/23 1241 95 %     Weight 07/27/23 1236 43.8 kg (96 lb 9.6 oz)     Height 07/27/23 1236 1.549 m (5\' 1" )     Head Circumference --      Peak Flow --      Pain Score 07/27/23 1235 10     Pain Loc --      Pain Education --      Exclude from Growth Chart --     Most recent vital signs: Vitals:   07/27/23 1425 07/27/23 1600  BP:  113/80  Pulse: 94   Resp: 15   Temp:    SpO2: 100%      General: Awake, no distress.  CV:  Good peripheral perfusion.  Resp:  Normal effort.  Clear to auscultation bilaterally Abd:  No distention.  Mild suprapubic tenderness, no CVA tenderness Other:  No rash   ED Results / Procedures / Treatments   Labs (all labs ordered are listed, but only abnormal results are displayed) Labs Reviewed  BASIC METABOLIC PANEL WITH GFR - Abnormal; Notable for the following components:      Result Value   Potassium 2.3 (*)    Glucose, Bld 103 (*)    Creatinine, Ser 0.36 (*)    Calcium 8.1 (*)    All other components within normal limits  CBC - Abnormal; Notable for the following components:   HCT 35.6 (*)    MCHC 37.1 (*)    RDW 15.7 (*)    All other components within  normal limits  URINALYSIS, ROUTINE W REFLEX MICROSCOPIC - Abnormal; Notable for the following components:   Color, Urine AMBER (*)    APPearance CLOUDY (*)    Ketones, ur 20 (*)    Protein, ur 30 (*)    Leukocytes,Ua SMALL (*)    Bacteria, UA MANY (*)    All other components within normal limits  URINE CULTURE  CULTURE, BLOOD (ROUTINE X 2)  CULTURE, BLOOD (ROUTINE X 2)  LACTIC ACID, PLASMA  LACTIC ACID, PLASMA  CBG MONITORING, ED  TROPONIN I (HIGH SENSITIVITY)     EKG  ED ECG REPORT I, Jene Every, the attending physician, personally viewed and interpreted this ECG.  Date: 07/27/2023  Rhythm: normal sinus rhythm QRS Axis: normal Intervals: Long QT ST/T Wave abnormalities: normal Narrative Interpretation: no evidence of acute ischemia    RADIOLOGY Chest x-ray viewed interpret by me, no acute abnormality    PROCEDURES:  Critical Care performed: no  Procedures  MEDICATIONS ORDERED IN ED: Medications  cefTRIAXone (ROCEPHIN) 1 g in sodium chloride 0.9 % 100 mL IVPB (has no administration in time range)  sodium chloride 0.9 % bolus 500 mL (0 mLs Intravenous Stopped 07/27/23 1532)  potassium chloride 10 mEq in 100 mL IVPB (0 mEq Intravenous Stopped 07/27/23 1532)     IMPRESSION / MDM / ASSESSMENT AND PLAN / ED COURSE  I reviewed the triage vital signs and the nursing notes. Patient's presentation is most consistent with acute presentation with potential threat to life or bodily function.  Patient presents with weakness as detailed above.  Her blood pressure is slightly low and her heart rate is elevated, differential includes infection, electrolyte abnormality, dehydration  Chest x-ray CBC troponin are reassuring, her potassium is significantly low at 2.3, will replete with IV potassium, IV fluids  Pending urinalysis, but patient was unable to give [place] a sample, will perform in and out.  Urinalysis suspicious for urinary tract infection, given her suprapubic  discomfort we will treat with Rocephin, add cultures  Blood pressure improved with fluids, will consult the hospitalist team      FINAL CLINICAL IMPRESSION(S) / ED DIAGNOSES   Final diagnoses:  Lower urinary tract infectious disease  Hypokalemia     Rx / DC Orders   ED Discharge Orders     None        Note:  This document was prepared using Dragon voice recognition software and may include unintentional dictation errors.   Jene Every, MD 07/27/23 [phone]

## 2023-07-27 NOTE — Progress Notes (Signed)
 Critical care note:  Date of note: 07/27/2023  Subjective: The patient has been hypotensive this evening with BP of 89/24.  She was given a bolus of 500 mL IV normal saline after which BP was up to 96/76.  Unfortunately BP dropped again and despite normal saline bolus her MAP was 45.  The patient was lethargic and malaise.  She stated that she felt terrible.  No chest pain or palpitations.  No cough or wheezing or dyspnea.  No nausea or vomiting or abdominal pain.  No fever or chills.  Objective: Physical examination: Generally: Acutely ill elderly Caucasian female, awake and alert but lethargic and feeling sick. Vital signs per history of present illness. Head - atraumatic, normocephalic.  Pupils - equal, round and reactive to light and accommodation. Extraocular movements are intact. No scleral icterus.  Oropharynx -dry mucous membranes and tongue. No pharyngeal erythema or exudate.  Neck - supple. No JVD. Carotid pulses 2+ bilaterally. No carotid bruits. No palpable thyromegaly or lymphadenopathy. Cardiovascular - regular rate and rhythm. Normal S1 and S2. No murmurs, gallops or rubs.  Lungs - clear to auscultation bilaterally.  Abdomen - soft and nontender. Positive bowel sounds. No palpable organomegaly or masses.  Extremities - no pitting edema, clubbing or cyanosis.  Neuro - grossly non-focal. Skin - no rashes. Breast, pelvic and rectal - deferred.  Labs and notes were reviewed.  Assessment/plan: 1.  Persistent hypotension concerning for septic shock due to UTI. - The patient was started on wide-open IV normal saline. - We started her on IV Levophed drip to be titrated for MAP more than 65. - We will broaden IV antibiotic therapy with vancomycin and cefepime. - The patient will be admitted to an ICU bed. - The ICU team was notified and consulted about the patient. - She would likely need CVP urgently.  Will continue other current plan of care.  Authorized and performed by: Valente David, MD Total critical care time:    35    minutes. Due to a high probability of clinically significant, life-threatening deterioration, the patient required my highest level of preparedness to intervene emergently and I personally spent this critical care time directly and personally managing the patient.  This critical care time included obtaining a history, examining the patient, pulse oximetry, ordering and review of studies, arranging urgent treatment with development of management plan, evaluation of patient's response to treatment, frequent reassessment, and discussions with other providers. This critical care time was performed to assess and manage the high probability of imminent, life-threatening deterioration that could result in multiorgan failure.  It was exclusive of separately billable procedures and treating other patients and teaching time.

## 2023-07-27 NOTE — ED Notes (Signed)
 Rapid Response called.

## 2023-07-27 NOTE — H&P (Addendum)
 History and Physical    Selena Pittman ZOX:[id number] DOB: 13-Jul-1950 DOA: 07/27/2023  PCP: Armando Gang, FNP (Confirm with patient/family/NH records and if not entered, this has to be entered at [facility] point of entry) Patient coming from: SNF  I have personally briefly reviewed patient's old medical records in [facility]  Chief Complaint: Feeling weak, ongoing diarrhea  HPI: Selena Pittman is a 73 y.o. female with medical history significant of bipolar affective disorder, breast cancer, anxiety/depression, HLD, hypothyroidism, presented with altered mentations.  Patient was found lethargy and somewhat confused this morning and facility staff called EMS.  Patient reported she has been having diarrhea for the last 4 to 5 days, 2-3 times a day, associated with periumbilical pain but denied any tenesmus no fever or chills no nausea vomiting.  She has been having cycles of constipation and diarrhea lately and has been taking as needed stool softener laxative for constipation and Imodium for diarrhea.  Patient denies any dysuria urinary frequency no back pain.  ED Course: Afebrile, nontachycardic blood pressure 89/71 improved to 113/80 after once 500 mL IV bolus in the ED. blood work showed K2.3, creatinine 2.3 bicarb 22, BUN 10 creatinine 2.3 WBC 7.9 hemoglobin 13.7.  UA showed 3+ WBC.  Negative for nitrite  Ceftriaxone x 1 given in the ED.  Review of Systems: As per HPI otherwise 14 point review of systems negative.    Past Medical History:  Diagnosis Date   Bipolar affective (HCC)    Breast cancer (HCC)    Cancer (HCC) 12/11/2014   INVASIVE MAMMARY CARCINOMA/.left breast/ T2 N0   Cardiomegaly    Depression    Emphysema of lung (HCC)    Endometriosis    Hyperlipidemia    Osteoporosis    Schizoaffective disorder (HCC)    Schizophrenia (HCC)    Severe sepsis (HCC) 04/02/2022   Thyroid nodule    Vitamin D deficiency     Past Surgical History:  Procedure Laterality  Date   ABDOMINAL HYSTERECTOMY     BREAST BIOPSY Left 12-11-14   INVASIVE MAMMARY CARCINOMA.   BREAST LUMPECTOMY WITH SENTINEL LYMPH NODE BIOPSY Left 12/31/2014   Procedure: LEFT BREAST LUMPECTOMY WITH ULTRASOUND GUIDED NEEDLE LOCALIZATION, SENTINEL LYMPH NODE BIOPSY ;  Surgeon: Kieth Brightly, MD;  Location: [facility] ORS;  Service: General;  Laterality: Left;   DIAGNOSTIC MAMMOGRAM  12/04/2014   Done at [facility] Imaging Category 5-Left Breast   DILATION AND CURETTAGE OF UTERUS     MASTECTOMY Left 2016   SIMPLE MASTECTOMY WITH AXILLARY SENTINEL NODE BIOPSY Left 01/14/2015   Procedure: SIMPLE MASTECTOMY;  Surgeon: Kieth Brightly, MD;  Location: [facility] ORS;  Service: General;  Laterality: Left;   TENDON REPAIR Right    hand   TONSILLECTOMY     TUBAL LIGATION       reports that she has been smoking cigarettes. She has a 45 pack-year smoking history. She has never used smokeless tobacco. She reports that she does not drink alcohol and does not use drugs.  Allergies  Allergen Reactions   Cat Dander    Milk-Related Compounds    Mixed Ragweed    Peanut-Containing Drug Products     Family History  Problem Relation Age of Onset   Cancer Mother        uterine   Heart attack Father    Breast cancer Neg Hx      Prior to Admission medications   Medication Sig Start Date End Date Taking? Authorizing Provider  traZODone (DESYREL) 50 MG tablet Take 25 mg by mouth at bedtime as needed. 07/26/23  Yes [provider]  acetaminophen (TYLENOL) 325 MG tablet Take 2 tablets (650 mg total) by mouth every 6 (six) hours as needed for mild pain, fever or headache. 11/04/22   Darlin Priestly, MD  alendronate (FOSAMAX) 70 MG tablet Take 1 tablet (70 mg total) by mouth once a week. Take with a full glass of water on an empty stomach. 11/04/22   Darlin Priestly, MD  cetirizine (ZYRTEC) 10 MG tablet Take 1 tablet (10 mg total) by mouth daily. 11/04/22   Darlin Priestly, MD  cholecalciferol (VITAMIN D) 1000 units tablet  Take 2 tablets (2,000 Units total) by mouth daily. 11/04/22   Darlin Priestly, MD  cloZAPine (CLOZARIL) 100 MG tablet Take 2 tablets (200 mg total) by mouth at bedtime. 11/04/22   Darlin Priestly, MD  coal tar-salicylic acid 2 % shampoo Use to wash scalp Tuesday, Thursday and Saturdays. 11/04/22   Darlin Priestly, MD  dicyclomine (BENTYL) 10 MG/5ML solution Take 5 mLs (10 mg total) by mouth 3 (three) times daily as needed (abdominal pain / cramping). 11/04/22   Darlin Priestly, MD  docusate sodium (COLACE) 100 MG capsule Take 1 capsule (100 mg total) by mouth 2 (two) times daily. 11/04/22   Darlin Priestly, MD  fluvoxaMINE (LUVOX) 50 MG tablet Take 1 tablet (50 mg total) by mouth 2 (two) times daily. 11/04/22   Darlin Priestly, MD  glycerin adult 2 g suppository Place 1 suppository rectally as needed for constipation. 11/04/22   Darlin Priestly, MD  hydrOXYzine (ATARAX) 25 MG tablet Take 1 tablet (25 mg total) by mouth 2 (two) times daily. 11/04/22   Darlin Priestly, MD  ketoconazole (NIZORAL) 2 % shampoo APPLY TO AFFECTED AREA TWICE A WEEK FOR 8 WEEKS; THEN USE AS NEEDED. 11/04/22   Darlin Priestly, MD  letrozole [facility]) 2.5 MG tablet Take 1 tablet (2.5 mg total) by mouth daily. 11/04/22   Darlin Priestly, MD  LINZESS 145 MCG CAPS capsule Take 1 capsule (145 mcg total) by mouth daily. 11/04/22   Darlin Priestly, MD  liver oil-zinc oxide (DESITIN) 40 % ointment Apply topically as needed for irritation. 11/04/22   Darlin Priestly, MD  metoCLOPramide (REGLAN) 10 MG tablet Take 1 tablet (10 mg total) by mouth 4 (four) times daily -  before meals and at bedtime for 10 days. 11/04/22 11/14/22  Darlin Priestly, MD  montelukast (SINGULAIR) 5 MG chewable tablet Chew 1 tablet (5 mg total) by mouth at bedtime. 11/04/22   Darlin Priestly, MD  omeprazole (PRILOSEC) 20 MG capsule Take 1 capsule (20 mg total) by mouth daily. 11/04/22   Darlin Priestly, MD  polyethylene glycol (MIRALAX / GLYCOLAX) 17 g packet Take 17 g by mouth daily. 11/04/22   Darlin Priestly, MD  simvastatin (ZOCOR) 20 MG tablet Take 1 tablet (20 mg  total) by mouth daily. 11/04/22   Darlin Priestly, MD    Physical Exam: Vitals:   07/27/23 1236 07/27/23 1241 07/27/23 1425 07/27/23 1600  BP:  (!) 89/71  113/80  Pulse:  99 94   Resp:  16 15   Temp:  97.8 F (36.6 C)    TempSrc:  Oral    SpO2:  95% 100%   Weight: 43.8 kg     Height: 5\' 1"  (1.549 m)       Constitutional: NAD, calm, comfortable Vitals:   07/27/23 1236 07/27/23 1241 07/27/23 1425 07/27/23 1600  BP:  (!) 89/71  113/80  Pulse:  99 94   Resp:  16 15   Temp:  97.8 F (36.6 C)    TempSrc:  Oral    SpO2:  95% 100%   Weight: 43.8 kg     Height: 5\' 1"  (1.549 m)      Eyes: PERRL, lids and conjunctivae normal ENMT: Mucous membranes are moist. Posterior pharynx clear of any exudate or lesions.Normal dentition.  Neck: normal, supple, no masses, no thyromegaly Respiratory: clear to auscultation bilaterally, no wheezing, no crackles. Normal respiratory effort. No accessory muscle use.  Cardiovascular: Regular rate and rhythm, no murmurs / rubs / gallops. No extremity edema. 2+ pedal pulses. No carotid bruits.  Abdomen: no tenderness, no masses palpated. No hepatosplenomegaly. Bowel sounds positive.  Musculoskeletal: no clubbing / cyanosis. No joint deformity upper and lower extremities. Good ROM, no contractures. Normal muscle tone.  Skin: no rashes, lesions, ulcers. No induration Neurologic: CN 2-12 grossly intact. Sensation intact, DTR normal. Strength 5/5 in all 4.  Psychiatric: Normal judgment and insight. Alert and oriented x 3. Normal mood.     Labs on Admission: I have personally reviewed following labs and imaging studies  CBC: Recent Labs  Lab 07/27/23 1240  WBC 7.9  HGB 13.2  HCT 35.6*  MCV 83.4  PLT 230   Basic Metabolic Panel: Recent Labs  Lab 07/27/23 1240  NA 137  K 2.3*  CL 103  CO2 22  GLUCOSE 103*  BUN 10  CREATININE 0.36*  CALCIUM 8.1*   GFR: Estimated Creatinine Clearance: 44.6 mL/min (A) (by C-G formula based on SCr of 0.36 mg/dL  (L)). Liver Function Tests: No results for input(s): "AST", "ALT", "ALKPHOS", "BILITOT", "PROT", "ALBUMIN" in the last 168 hours. No results for input(s): "LIPASE", "AMYLASE" in the last 168 hours. No results for input(s): "AMMONIA" in the last 168 hours. Coagulation Profile: No results for input(s): "INR", "PROTIME" in the last 168 hours. Cardiac Enzymes: No results for input(s): "CKTOTAL", "CKMB", "CKMBINDEX", "TROPONINI" in the last 168 hours. BNP (last 3 results) No results for input(s): "PROBNP" in the last 8760 hours. HbA1C: No results for input(s): "HGBA1C" in the last 72 hours. CBG: No results for input(s): "GLUCAP" in the last 168 hours. Lipid Profile: No results for input(s): "CHOL", "HDL", "LDLCALC", "TRIG", "CHOLHDL", "LDLDIRECT" in the last 72 hours. Thyroid Function Tests: No results for input(s): "TSH", "T4TOTAL", "FREET4", "T3FREE", "THYROIDAB" in the last 72 hours. Anemia Panel: No results for input(s): "VITAMINB12", "FOLATE", "FERRITIN", "TIBC", "IRON", "RETICCTPCT" in the last 72 hours. Urine analysis:    Component Value Date/Time   COLORURINE AMBER (A) 07/27/2023 1525   APPEARANCEUR CLOUDY (A) 07/27/2023 1525   LABSPEC 1.019 07/27/2023 1525   PHURINE 5.0 07/27/2023 1525   GLUCOSEU NEGATIVE 07/27/2023 1525   HGBUR NEGATIVE 07/27/2023 1525   BILIRUBINUR NEGATIVE 07/27/2023 1525   KETONESUR 20 (A) 07/27/2023 1525   PROTEINUR 30 (A) 07/27/2023 1525   NITRITE NEGATIVE 07/27/2023 1525   LEUKOCYTESUR SMALL (A) 07/27/2023 1525    Radiological Exams on Admission: DG Chest Port 1 View Result Date: 07/27/2023 CLINICAL DATA:  Weakness. EXAM: PORTABLE CHEST 1 VIEW COMPARISON:  October 29, 2022. FINDINGS: The heart size and mediastinal contours are within normal limits. Stable right midlung opacity is noted concerning for scarring or subsegmental atelectasis. Left lung is clear. Old proximal left humeral fracture is noted. IMPRESSION: Stable right midlung subsegmental  atelectasis or scarring. Electronically Signed   By: Lupita Raider M.D.   On: 07/27/2023 14:56  EKG: Independently reviewed.  Sinus rhythm, no acute ST changes, prolonged QTc 581  Assessment/Plan Principal Problem:   AMS (altered mental status)  (please populate well all problems here in Problem List. (For example, if patient is on BP meds at home and you resume or decide to hold them, it is a problem that needs to be her. Same for CAD, COPD, HLD and so on)  Generalized weakness Severe hypokalemia Probably transient encephalopathy from hypokalemia and weakness -Hyponatremia and weakness likely secondary to ongoing diarrhea.  Mentation however is at baseline. - Rule out infectious diarrhea, check GI pathogen.  Monitor off antibiotics - Discontinue stool softener and laxative including Linzess, twice daily MiraLAX and Colace - P.o. replacement of potassium, check magnesium and phosphorus level, repeat potassium level tonight and tomorrow morning  Abdominal pain and diarrhea - Suspect enteritis, as abdominal physical exam benign, send GI pathogen - Supportive care, consider Imodium if GI procedure negative  UTI -Cerftriaxone  History of breast cancer -Continue letrozole  Prolonged Qtc - Secondary to hypokalemia, replenish potassium, and repeat EKG tomorrow  Bipolar affective disorder - Mentation at baseline, AO x 3 - Continue home psychiatry medication clozapine and SSRI  DVT prophylaxis: Lovenox Code Status: Full code Family Communication: None at bedside Disposition Plan: Expect less than 2 midnight hospital stay Consults called: None Admission status: Telemetry observation   Emeline General MD [facility] Pager [phone]  07/27/2023, 5:09 PM

## 2023-07-27 NOTE — ED Notes (Signed)
 Mansy MD at bedside. Levo verbal order started

## 2023-07-27 NOTE — ED Notes (Signed)
 CCMD called to place pt on monitor

## 2023-07-27 NOTE — ED Notes (Addendum)
 Mariah RN attempting to get manual BP

## 2023-07-27 NOTE — ED Triage Notes (Signed)
 Pt arrives via ems from [facility], pt was sent due to lethargy, pt is alert and oriented x3, pt states that her body hurts all over, ems reports recent treatment for a uti, pt also has failure to thrive

## 2023-07-28 ENCOUNTER — Inpatient Hospital Stay

## 2023-07-28 DIAGNOSIS — E46 Unspecified protein-calorie malnutrition: Secondary | ICD-10-CM

## 2023-07-28 DIAGNOSIS — A419 Sepsis, unspecified organism: Secondary | ICD-10-CM | POA: Diagnosis not present

## 2023-07-28 DIAGNOSIS — R197 Diarrhea, unspecified: Secondary | ICD-10-CM | POA: Diagnosis not present

## 2023-07-28 DIAGNOSIS — N390 Urinary tract infection, site not specified: Secondary | ICD-10-CM

## 2023-07-28 DIAGNOSIS — E43 Unspecified severe protein-calorie malnutrition: Secondary | ICD-10-CM | POA: Insufficient documentation

## 2023-07-28 DIAGNOSIS — Z853 Personal history of malignant neoplasm of breast: Secondary | ICD-10-CM

## 2023-07-28 DIAGNOSIS — G9341 Metabolic encephalopathy: Secondary | ICD-10-CM | POA: Diagnosis not present

## 2023-07-28 DIAGNOSIS — E878 Other disorders of electrolyte and fluid balance, not elsewhere classified: Secondary | ICD-10-CM | POA: Diagnosis not present

## 2023-07-28 DIAGNOSIS — R7401 Elevation of levels of liver transaminase levels: Secondary | ICD-10-CM

## 2023-07-28 DIAGNOSIS — R571 Hypovolemic shock: Secondary | ICD-10-CM | POA: Diagnosis not present

## 2023-07-28 DIAGNOSIS — E876 Hypokalemia: Secondary | ICD-10-CM | POA: Diagnosis not present

## 2023-07-28 DIAGNOSIS — E440 Moderate protein-calorie malnutrition: Secondary | ICD-10-CM | POA: Insufficient documentation

## 2023-07-28 DIAGNOSIS — R9431 Abnormal electrocardiogram [ECG] [EKG]: Secondary | ICD-10-CM

## 2023-07-28 DIAGNOSIS — E872 Acidosis, unspecified: Secondary | ICD-10-CM | POA: Insufficient documentation

## 2023-07-28 LAB — BASIC METABOLIC PANEL WITH GFR
Anion gap: 6 (ref 5–15)
Anion gap: 7 (ref 5–15)
BUN: 5 mg/dL — ABNORMAL LOW (ref 8–23)
BUN: <5 mg/dL — ABNORMAL LOW (ref 8–23)
CO2: 19 mmol/L — ABNORMAL LOW (ref 22–32)
CO2: 19 mmol/L — ABNORMAL LOW (ref 22–32)
Calcium: 7.3 mg/dL — ABNORMAL LOW (ref 8.9–10.3)
Calcium: 7 mg/dL — ABNORMAL LOW (ref 8.9–10.3)
Chloride: 110 mmol/L (ref 98–111)
Chloride: 108 mmol/L (ref 98–111)
Creatinine, Ser: 0.33 mg/dL — ABNORMAL LOW (ref 0.44–1.00)
Creatinine, Ser: <0.3 mg/dL — ABNORMAL LOW (ref 0.44–1.00)
GFR, Estimated: >60 mL/min (ref >60)
Glucose, Bld: 93 mg/dL (ref 70–99)
Glucose, Bld: 83 mg/dL (ref 70–99)
Potassium: 6 mmol/L — ABNORMAL HIGH (ref 3.5–5.1)
Potassium: 3.5 mmol/L (ref 3.5–5.1)
Sodium: 135 mmol/L (ref 135–145)
Sodium: 134 mmol/L — ABNORMAL LOW (ref 135–145)

## 2023-07-28 LAB — CBC WITH DIFFERENTIAL/PLATELET
Abs Immature Granulocytes: 0.08 10*3/uL — ABNORMAL HIGH (ref 0.00–0.07)
Basophils Absolute: 0 10*3/uL (ref 0.0–0.1)
Basophils Relative: 0 %
Eosinophils Absolute: 0 10*3/uL (ref 0.0–0.5)
Eosinophils Relative: 1 %
HCT: 29.2 % — ABNORMAL LOW (ref 36.0–46.0)
Hemoglobin: 10.3 g/dL — ABNORMAL LOW (ref 12.0–15.0)
Immature Granulocytes: 1 %
Lymphocytes Relative: 10 %
Lymphs Abs: 0.6 10*3/uL — ABNORMAL LOW (ref 0.7–4.0)
MCH: 30.5 pg (ref 26.0–34.0)
MCHC: 35.3 g/dL (ref 30.0–36.0)
MCV: 86.4 fL (ref 80.0–100.0)
Monocytes Absolute: 0.4 10*3/uL (ref 0.1–1.0)
Monocytes Relative: 6 %
Neutro Abs: 4.7 10*3/uL (ref 1.7–7.7)
Neutrophils Relative %: 82 %
Platelets: 165 10*3/uL (ref 150–400)
RBC: 3.38 MIL/uL — ABNORMAL LOW (ref 3.87–5.11)
RDW: 16.6 % — ABNORMAL HIGH (ref 11.5–15.5)
WBC: 5.7 10*3/uL (ref 4.0–10.5)
nRBC: 0 % (ref 0.0–0.2)

## 2023-07-28 LAB — GLUCOSE, CAPILLARY
Glucose-Capillary: 107 mg/dL — ABNORMAL HIGH (ref 70–99)
Glucose-Capillary: 202 mg/dL — ABNORMAL HIGH (ref 70–99)

## 2023-07-28 LAB — LACTIC ACID, PLASMA
Lactic Acid, Venous: 1.2 mmol/L (ref 0.5–1.9)
Lactic Acid, Venous: 1.5 mmol/L (ref 0.5–1.9)

## 2023-07-28 LAB — COMPREHENSIVE METABOLIC PANEL WITH GFR
ALT: 87 U/L — ABNORMAL HIGH (ref 0–44)
AST: 67 U/L — ABNORMAL HIGH (ref 15–41)
Albumin: 1.8 g/dL — ABNORMAL LOW (ref 3.5–5.0)
Alkaline Phosphatase: 186 U/L — ABNORMAL HIGH (ref 38–126)
Anion gap: 5 (ref 5–15)
BUN: 7 mg/dL — ABNORMAL LOW (ref 8–23)
CO2: 16 mmol/L — ABNORMAL LOW (ref 22–32)
Calcium: 5.6 mg/dL — CL (ref 8.9–10.3)
Chloride: 118 mmol/L — ABNORMAL HIGH (ref 98–111)
Creatinine, Ser: <0.3 mg/dL — ABNORMAL LOW (ref 0.44–1.00)
Glucose, Bld: 97 mg/dL (ref 70–99)
Potassium: 2.7 mmol/L — CL (ref 3.5–5.1)
Sodium: 139 mmol/L (ref 135–145)
Total Bilirubin: 0.7 mg/dL (ref 0.0–1.2)
Total Protein: 3.8 g/dL — ABNORMAL LOW (ref 6.5–8.1)

## 2023-07-28 LAB — MRSA NEXT GEN BY PCR, NASAL: MRSA by PCR Next Gen: NOT DETECTED

## 2023-07-28 LAB — PROTIME-INR
INR: 1.3 — ABNORMAL HIGH (ref 0.8–1.2)
Prothrombin Time: 16 s — ABNORMAL HIGH (ref 11.4–15.2)

## 2023-07-28 LAB — MAGNESIUM
Magnesium: 1.2 mg/dL — ABNORMAL LOW (ref 1.7–2.4)
Magnesium: 2.7 mg/dL — ABNORMAL HIGH (ref 1.7–2.4)

## 2023-07-28 LAB — PROCALCITONIN: Procalcitonin: 0.14 ng/mL

## 2023-07-28 LAB — APTT: aPTT: 38 s — ABNORMAL HIGH (ref 24–36)

## 2023-07-28 LAB — PHOSPHORUS
Phosphorus: <1 mg/dL — CL (ref 2.5–4.6)
Phosphorus: 1.9 mg/dL — ABNORMAL LOW (ref 2.5–4.6)

## 2023-07-28 MED ORDER — MAGNESIUM SULFATE 2 GM/50ML IV SOLN
2.0000 g | Freq: Once | INTRAVENOUS | Status: AC
  Administered 2023-07-28: 2 g via INTRAVENOUS
  Filled 2023-07-28: qty 50

## 2023-07-28 MED ORDER — DEXTROSE 50 % IV SOLN
1.0000 | Freq: Once | INTRAVENOUS | Status: AC
  Administered 2023-07-28: 50 mL via INTRAVENOUS
  Filled 2023-07-28: qty 50

## 2023-07-28 MED ORDER — SODIUM PHOSPHATES 45 MMOLE/15ML IV SOLN
30.0000 mmol | Freq: Once | INTRAVENOUS | Status: AC
  Administered 2023-07-28: 30 mmol via INTRAVENOUS
  Filled 2023-07-28: qty 10

## 2023-07-28 MED ORDER — SODIUM CHLORIDE 0.9 % IV SOLN
2.0000 g | Freq: Once | INTRAVENOUS | Status: DC
  Filled 2023-07-28: qty 12.5

## 2023-07-28 MED ORDER — THIAMINE HCL 100 MG PO TABS
100.0000 mg | ORAL_TABLET | Freq: Every day | ORAL | Status: DC
  Administered 2023-07-29 – 2023-08-03 (×6): 100 mg via ORAL
  Filled 2023-07-28 (×11): qty 1

## 2023-07-28 MED ORDER — INSULIN ASPART 100 UNIT/ML IV SOLN
10.0000 [IU] | Freq: Once | INTRAVENOUS | Status: AC
  Administered 2023-07-28: 10 [IU] via INTRAVENOUS
  Filled 2023-07-28: qty 0.1

## 2023-07-28 MED ORDER — ALBUMIN HUMAN 25 % IV SOLN
25.0000 g | Freq: Once | INTRAVENOUS | Status: AC
Start: 2023-07-28 — End: 2023-07-28
  Administered 2023-07-28: 25 g via INTRAVENOUS
  Filled 2023-07-28: qty 100

## 2023-07-28 MED ORDER — POTASSIUM PHOSPHATES 15 MMOLE/5ML IV SOLN
30.0000 mmol | Freq: Once | INTRAVENOUS | Status: AC
  Administered 2023-07-28: 30 mmol via INTRAVENOUS
  Filled 2023-07-28: qty 10

## 2023-07-28 MED ORDER — POTASSIUM CHLORIDE 10 MEQ/100ML IV SOLN
10.0000 meq | INTRAVENOUS | Status: AC
  Administered 2023-07-28 (×4): 10 meq via INTRAVENOUS
  Filled 2023-07-28 (×4): qty 100

## 2023-07-28 MED ORDER — MIDODRINE HCL 5 MG PO TABS
5.0000 mg | ORAL_TABLET | Freq: Three times a day (TID) | ORAL | Status: DC
  Administered 2023-07-28 – 2023-07-29 (×2): 5 mg via ORAL
  Filled 2023-07-28 (×2): qty 1

## 2023-07-28 MED ORDER — CALCIUM GLUCONATE-NACL 2-0.675 GM/100ML-% IV SOLN
2.0000 g | Freq: Once | INTRAVENOUS | Status: AC
Start: 2023-07-28 — End: 2023-07-28
  Administered 2023-07-28: 2000 mg via INTRAVENOUS
  Filled 2023-07-28: qty 100

## 2023-07-28 MED ORDER — VANCOMYCIN HCL 1250 MG/250ML IV SOLN: 1250.0000 mg | INTRAVENOUS | Status: DC

## 2023-07-28 MED ORDER — SODIUM CHLORIDE 0.9 % IV SOLN
2.0000 g | Freq: Two times a day (BID) | INTRAVENOUS | Status: DC
  Administered 2023-07-28 – 2023-07-30 (×5): 2 g via INTRAVENOUS
  Filled 2023-07-28 (×6): qty 12.5

## 2023-07-28 MED ORDER — VANCOMYCIN HCL IN DEXTROSE 1-5 GM/200ML-% IV SOLN
1000.0000 mg | Freq: Once | INTRAVENOUS | Status: AC
  Administered 2023-07-28: 1000 mg via INTRAVENOUS
  Filled 2023-07-28: qty 200

## 2023-07-28 MED ORDER — SODIUM ZIRCONIUM CYCLOSILICATE 5 G PO PACK
10.0000 g | PACK | Freq: Once | ORAL | Status: AC
  Administered 2023-07-28: 10 g via ORAL
  Filled 2023-07-28: qty 2

## 2023-07-28 MED ORDER — ALBUMIN HUMAN 25 % IV SOLN
25.0000 g | Freq: Four times a day (QID) | INTRAVENOUS | Status: AC
  Administered 2023-07-28 (×2): 25 g via INTRAVENOUS
  Administered 2023-07-28: 12.5 g via INTRAVENOUS
  Filled 2023-07-28 (×3): qty 100

## 2023-07-28 MED ORDER — MONTELUKAST SODIUM 10 MG PO TABS
5.0000 mg | ORAL_TABLET | Freq: Every day | ORAL | Status: DC
  Administered 2023-07-28 – 2023-08-02 (×6): 5 mg via ORAL
  Filled 2023-07-28 (×6): qty 1

## 2023-07-28 MED ORDER — ADULT MULTIVITAMIN W/MINERALS CH
1.0000 | ORAL_TABLET | Freq: Every day | ORAL | Status: DC
  Administered 2023-07-29 – 2023-08-03 (×6): 1 via ORAL
  Filled 2023-07-28 (×6): qty 1

## 2023-07-28 MED ORDER — SODIUM CHLORIDE 0.9 % IV SOLN
150.0000 mL/h | INTRAVENOUS | Status: AC
  Administered 2023-07-28 (×4): 150 mL/h via INTRAVENOUS

## 2023-07-28 MED ORDER — SODIUM CHLORIDE 0.9 % IV BOLUS (SEPSIS)
1000.0000 mL | Freq: Once | INTRAVENOUS | Status: AC
  Administered 2023-07-28: 1000 mL via INTRAVENOUS

## 2023-07-28 MED ORDER — CHLORHEXIDINE GLUCONATE CLOTH 2 % EX PADS
6.0000 | MEDICATED_PAD | Freq: Every day | CUTANEOUS | Status: DC
  Administered 2023-07-28 – 2023-08-02 (×5): 6 via TOPICAL
  Filled 2023-07-28: qty 6

## 2023-07-28 MED ORDER — SODIUM CHLORIDE 0.9 % IV BOLUS (SEPSIS)
500.0000 mL | Freq: Once | INTRAVENOUS | Status: AC
  Administered 2023-07-28: 500 mL via INTRAVENOUS

## 2023-07-28 MED ORDER — MAGNESIUM SULFATE 4 GM/100ML IV SOLN
4.0000 g | Freq: Once | INTRAVENOUS | Status: AC
  Administered 2023-07-28: 4 g via INTRAVENOUS
  Filled 2023-07-28: qty 100

## 2023-07-28 MED ORDER — POTASSIUM CHLORIDE CRYS ER 20 MEQ PO TBCR
40.0000 meq | EXTENDED_RELEASE_TABLET | Freq: Once | ORAL | Status: AC
  Administered 2023-07-28: 40 meq via ORAL
  Filled 2023-07-28: qty 2

## 2023-07-28 MED ORDER — SODIUM CHLORIDE 0.9 % IV SOLN: 250.0000 mL | INTRAVENOUS | Status: DC

## 2023-07-28 MED ORDER — CALCIUM GLUCONATE-NACL 1-0.675 GM/50ML-% IV SOLN
1.0000 g | Freq: Once | INTRAVENOUS | Status: AC
  Administered 2023-07-28: 1000 mg via INTRAVENOUS
  Filled 2023-07-28: qty 50

## 2023-07-28 MED ORDER — ENSURE ENLIVE PO LIQD
237.0000 mL | Freq: Two times a day (BID) | ORAL | Status: DC
  Administered 2023-07-29 – 2023-08-01 (×5): 237 mL via ORAL

## 2023-07-28 NOTE — Progress Notes (Signed)
 Initial Nutrition Assessment  DOCUMENTATION CODES:   Non-severe (moderate) malnutrition in context of chronic illness  INTERVENTION:   Ensure Enlive po BID, each supplement provides 350 kcal and 20 grams of protein.  MVI po daily  Thiamine 100mg  po daily x 7 days  Dysphagia 3 diet   Pt at high refeed risk; recommend monitor potassium, magnesium and phosphorus labs daily until stable  Daily weights   NUTRITION DIAGNOSIS:   Moderate Malnutrition related to chronic illness as evidenced by moderate fat depletion, moderate muscle depletion, severe muscle depletion.  GOAL:   Patient will meet greater than or equal to 90% of their needs  MONITOR:   PO intake, Supplement acceptance, Labs, Weight trends, Skin, I & O's  REASON FOR ASSESSMENT:   Consult Assessment of nutrition requirement/status  ASSESSMENT:   73 y/o female with h/o hypothyroidism, necrotizing pneumonia, schizophrenia, GERD, bipolar disorder, HLD, IBS, depression, emphysema, breast cancer s/p mastectomy who is admitted with UTI, septic shock, AMS and diarrhea.  Met with pt in room today. Pt reports fairly good appetite and oral intake at baseline but reports poor oral intake in hospital. Per chart review, pt with diarrhea for 4-5 days pta. Pt reports that she is not too hungry today. Pt reports that she does not drink any supplements at home but reports that she is willing to try chocolate Ensure in hospital. Pt is at actively refeeding; electrolytes being monitored and supplemented. RD discussed with pt the importance of adequate nutrition needed to preserve lean muscle. Per chart, pt appears fairly weight stable at baseline.    Of note, pt noted to have a milk allergy. Spoke with patient, pt reports that she is able to drink some milk as well as eat cheese and yogurt. Pt reports diarrhea with large amounts of milk. Will change this from an allergy to an intolerance in chart.   Medications reviewed and include:  risaquad, insulin, lovenox, midodrine, protonix, lokelma   Labs reviewed: K 6.0(H), BUN 5(L), creat 0.33(L), Ca 7.3(L) adj. 9.06 wnl, alb 1.8(L), P 1.9(L), Mg 2.7 wnl Hgb 10.3(L), Hct 29.2(L)  NUTRITION - FOCUSED PHYSICAL EXAM:  Flowsheet Row Most Recent Value  Orbital Region Moderate depletion  Upper Arm Region Moderate depletion  Thoracic and Lumbar Region Moderate depletion  Buccal Region Mild depletion  Temple Region Moderate depletion  Clavicle Bone Region Severe depletion  Clavicle and Acromion Bone Region Severe depletion  Scapular Bone Region Moderate depletion  Dorsal Hand Severe depletion  Patellar Region Moderate depletion  Anterior Thigh Region Moderate depletion  Posterior Calf Region Severe depletion  Edema (RD Assessment) None  Hair Reviewed  Eyes Reviewed  Mouth Reviewed  Skin Reviewed  Nails Reviewed   Diet Order:   Diet Order             DIET DYS 3 Room service appropriate? Yes; Fluid consistency: Thin  Diet effective now                  EDUCATION NEEDS:   Education needs have been addressed  Skin:  Skin Assessment: Reviewed RN Assessment  Last BM:  4/9- type 6  Height:   Ht Readings from Last 1 Encounters:  07/28/23 5\' 5"  (1.651 m)    Weight:   Wt Readings from Last 1 Encounters:  07/28/23 51.2 kg    Ideal Body Weight:  56.8 kg  BMI:  Body mass index is 18.78 kg/m.  Estimated Nutritional Needs:   Kcal:  1500-1700kcal/day  Protein:  75-85g/day  Fluid:  1.4-1.6L/day  Betsey Holiday MS, RD, LDN If unable to be reached, please send secure chat to "RD inpatient" available from 8:00a-4:00p daily

## 2023-07-28 NOTE — Progress Notes (Signed)
 Dr. Renae Gloss gave order that patient may travel to [place] without RN.

## 2023-07-28 NOTE — Sepsis Progress Note (Signed)
 Elink monitoring for the code sepsis protocol.

## 2023-07-28 NOTE — Plan of Care (Signed)
 Vital signs reviewed, ICU needs resolved  Will sign off at this time. No further recommendations at this time. Patient off pressors  Case discussed with Dr Hilton Sinclair   Lucie Leather, M.D.  [facility] Pulmonary & Critical Care Medicine  Medical Director [facility] Medical Director [facility] Cardio-Pulmonary Department

## 2023-07-28 NOTE — Plan of Care (Signed)
  Problem: Fluid Volume: Goal: Hemodynamic stability will improve Outcome: Progressing   Problem: Clinical Measurements: Goal: Diagnostic test results will improve Outcome: Not Progressing   Problem: Respiratory: Goal: Ability to maintain adequate ventilation will improve Outcome: Progressing   Problem: Clinical Measurements: Goal: Respiratory complications will improve Outcome: Progressing Goal: Cardiovascular complication will be avoided Outcome: Progressing   Problem: Clinical Measurements: Goal: Cardiovascular complication will be avoided Outcome: Progressing

## 2023-07-28 NOTE — Consult Note (Signed)
 NAME:  Selena Pittman, MRN:  [id number], DOB:  06/17/1950, LOS: 1 ADMISSION DATE:  07/27/2023, CONSULTATION DATE:  07/28/23 REFERRING MD:  Dr. Arville Care, CHIEF COMPLAINT:  weakness & diarrhea  History of Present Illness:  73 yo F presenting to [facility] ED from SNF via EMS for evaluation of weakness and ongoing diarrhea.  History obtained per chart review and patient bedside report. Patient is a poor historian who's symptoms change with each interview. She has stayed consistent about experiencing diarrhea since 07/23/23 about 2-3 times a day with associated periumbilical pain. She sometimes endorses dysuria. She initially denied vomiting, later stating she had experienced vomiting but unable to give more details. She denied fever/ chills, nausea, chest pain or back pain. She has been having cycles of constipation and diarrhea lately and had been taking a stool softener and imodium PRN.   ED course: Upon arrival the patient was lethargic, afebrile with soft BP that improved after 500 mL bolus. Labs significant for hypokalemia & hypophosphatemia, UA +ketones, +small leuks, pyuria. TRH consulted for admission. Medications given: Rocephin, 500 mL bolus, K+ Initial Vitals: 97.8, 16, 99, 89/71, 95% on RA Significant labs: (Labs/ Imaging personally reviewed) I, Cheryll Cockayne Rust-Chester, AGACNP-BC, personally viewed and interpreted this ECG. EKG Interpretation: Date: 07/27/23, EKG Time: 12:33, Rate: 97, Rhythm: NSR, QRS Axis: normal, Intervals: prolonged Qtc, ST/T Wave abnormalities: none, Narrative Interpretation: NSR with prolonged Qtc Chemistry: Na+: 137, K+: 2.3 > 3.3, BUN/Cr.: 10/ 0.36, Serum CO2/ AG: 22/12, Phos: 1.4, Mg: 1.7 Hematology: WBC: 7.9, Hgb: 13.2,  Troponin: 10, Lactic: 0.7 > 1.2,   CXR 07/27/23: stable right mid lung subsegmental atelectasis or scarring  Overnight patient became suddenly hypotensive. She received 2.5 L IVF bolus and antibiotics were broadened to cefepime & vancomycin. Repeat labs  ordered and patient was started on levophed infusion.  PCCM consulted for assistance in management and monitoring due to circulatory shock suspect secondary to hypovolemia requiring peripheral vasopressor support.  Pertinent  Medical History  Bipolar Disorder Schizophrenia Breast cancer Anxiety/ Depression HLD Hypothyroidism  Significant Hospital Events: Including procedures, antibiotic start and stop dates in addition to other pertinent events   07/27/23: Admit to med surg for electrolyte derangements and UTI treatment. Overnight patient became hypotensive requiring peripheral vasopressor support. PCCM consulted.  Interim History / Subjective:  Patient alert and responsive, appears at baseline on peripheral vasopressor support. Plan of care discussed, all questions and concerns answered at this time.  Objective   Blood pressure 105/79, pulse 79, temperature 97.7 F (36.5 C), temperature source Oral, resp. rate 13, height 5\' 1"  (1.549 m), weight 43.8 kg, SpO2 100%.       No intake or output data in the 24 hours ending 07/28/23 0131 Filed Weights   07/27/23 1236  Weight: 43.8 kg    Examination: General: Adult female, acutely ill, lying in bed, NAD HEENT: MM pink/moist, anicteric, atraumatic, neck supple Neuro: A&O x 3, able to follow commands, PERRL +3, MAE CV: s1s2 RRR, NSR on monitor, no r/m/g Pulm: Regular, non labored on RA, breath sounds clear-BUL & diminished-BLL GI: soft, rounded, non tender, bs x 4 Skin: no rashes/lesions noted Extremities: warm/dry, pulses + 2 R/P, no edema noted  Resolved Hospital Problem list     Assessment & Plan:  Circulatory Shock suspect secondary to hypovolemia Query sepsis s/t UTI Initial interventions/workup included: 3 L of NS/LR & Cefepime/ Vancomycin - Supplemental oxygen as needed, to maintain SpO2 > 90% - f/u cultures, trend lactic/ PCT - Daily CBC, monitor  WBC/ fever curve - IV antibiotics: cefepime & vancomycin >> f/u PCT,  consider de-escalating antibiotics depending on lactic and PCT trend - IVF hydration as needed: LR infusion 150 mL/h - Continue vasopressors to maintain MAP< 65, norepinephrine - Strict I/O's: alert provider if UOP < 0.5 mL/kg/hr  Hypokalemia Hypophosphatemia Hypoalbuminemia Malnutrition - Daily BMP, replace electrolytes PRN - aggressive electrolyte replacement, follow closely - albumin supplementation - dietary consulted  Suspected Enteritis Mild Transaminitis  - Trend hepatic function - RUQ [place] ordered - avoid hepatotoxic agents - GI panel pending, enteric precautions - IVF PRN - consider CT abdomen/pelvis if lactic becomes elevated  Prolonged Qtc Suspect secondary to hypokalemia vs psychiatric medications - repeat EKG > if still prolonged, hold clozapine until Qtc has normalized  History of breast cancer - continue letrozole  Bipolar disorder Schizophrenia Anxiety/Depression - hold clozapine & trazodone until Qtc has normalized - continue fluvoxamine  Best Practice (right click and "Reselect all SmartList Selections" daily)  Diet/type: Regular consistency (see orders) DVT prophylaxis LMWH Pressure ulcer(s): N/A GI prophylaxis: PPI Lines: N/A Foley:  N/A Code Status:  full code Last date of multidisciplinary goals of care discussion [07/27/23]  Labs   CBC: Recent Labs  Lab 07/27/23 1240 07/28/23 0105  WBC 7.9 5.7  NEUTROABS  --  4.7  HGB 13.2 10.3*  HCT 35.6* 29.2*  MCV 83.4 86.4  PLT 230 165    Basic Metabolic Panel: Recent Labs  Lab 07/27/23 1240 07/27/23 2225  NA 137  --   K 2.3* 3.3*  CL 103  --   CO2 22  --   GLUCOSE 103*  --   BUN 10  --   CREATININE 0.36*  --   CALCIUM 8.1*  --   MG 1.7  --   PHOS 1.4*  --    GFR: Estimated Creatinine Clearance: 44.6 mL/min (A) (by C-G formula based on SCr of 0.36 mg/dL (L)). Recent Labs  Lab 07/27/23 1240 07/27/23 1550 07/28/23 0105  WBC 7.9  --  5.7  LATICACIDVEN  --  0.7 1.2    Liver  Function Tests: No results for input(s): "AST", "ALT", "ALKPHOS", "BILITOT", "PROT", "ALBUMIN" in the last 168 hours. No results for input(s): "LIPASE", "AMYLASE" in the last 168 hours. No results for input(s): "AMMONIA" in the last 168 hours.  ABG No results found for: "PHART", "PCO2ART", "PO2ART", "HCO3", "TCO2", "ACIDBASEDEF", "O2SAT"   Coagulation Profile: Recent Labs  Lab 07/28/23 0105  INR 1.3*    Cardiac Enzymes: No results for input(s): "CKTOTAL", "CKMB", "CKMBINDEX", "TROPONINI" in the last 168 hours.  HbA1C: No results found for: "HGBA1C"  CBG: No results for input(s): "GLUCAP" in the last 168 hours.  Review of Systems: Positives in BOLD  Gen: Denies fever, chills, weight change, fatigue, night sweats HEENT: Denies blurred vision, double vision, hearing loss, tinnitus, sinus congestion, rhinorrhea, sore throat, neck stiffness, dysphagia PULM: Denies shortness of breath, cough, sputum production, hemoptysis, wheezing CV: Denies chest pain, edema, orthopnea, paroxysmal nocturnal dyspnea, palpitations GI: Denies abdominal pain, nausea, vomiting, diarrhea, hematochezia, melena, constipation, change in bowel habits GU: Denies dysuria, hematuria, polyuria, oliguria, urethral discharge Endocrine: Denies hot or cold intolerance, polyuria, polyphagia or appetite change Derm: Denies rash, dry skin, scaling or peeling skin change Heme: Denies easy bruising, bleeding, bleeding gums Neuro: Denies headache, numbness, weakness, slurred speech, loss of memory or consciousness  Past Medical History:  She,  has a past medical history of Bipolar affective (HCC), Breast cancer (HCC), Cancer (HCC) (12/11/2014), Cardiomegaly, Depression,  Emphysema of lung (HCC), Endometriosis, Hyperlipidemia, Osteoporosis, Schizoaffective disorder (HCC), Schizophrenia (HCC), Severe sepsis (HCC) (04/02/2022), Thyroid nodule, and Vitamin D deficiency.   Surgical History:   Past Surgical History:   Procedure Laterality Date   ABDOMINAL HYSTERECTOMY     BREAST BIOPSY Left 12-11-14   INVASIVE MAMMARY CARCINOMA.   BREAST LUMPECTOMY WITH SENTINEL LYMPH NODE BIOPSY Left 12/31/2014   Procedure: LEFT BREAST LUMPECTOMY WITH ULTRASOUND GUIDED NEEDLE LOCALIZATION, SENTINEL LYMPH NODE BIOPSY ;  Surgeon: Kieth Brightly, MD;  Location: [facility] ORS;  Service: General;  Laterality: Left;   DIAGNOSTIC MAMMOGRAM  12/04/2014   Done at [facility] Category 5-Left Breast   DILATION AND CURETTAGE OF UTERUS     MASTECTOMY Left 2016   SIMPLE MASTECTOMY WITH AXILLARY SENTINEL NODE BIOPSY Left 01/14/2015   Procedure: SIMPLE MASTECTOMY;  Surgeon: Kieth Brightly, MD;  Location: [facility] ORS;  Service: General;  Laterality: Left;   TENDON REPAIR Right    hand   TONSILLECTOMY     TUBAL LIGATION       Social History:   reports that she has been smoking cigarettes. She has a 45 pack-year smoking history. She has never used smokeless tobacco. She reports that she does not drink alcohol and does not use drugs.   Family History:  Her family history includes Cancer in her mother; Heart attack in her father. There is no history of Breast cancer.   Allergies Allergies  Allergen Reactions   Cat Dander    Milk-Related Compounds    Mixed Ragweed    Peanut-Containing Drug Products      Home Medications  Prior to Admission medications   Medication Sig Start Date End Date Taking? Authorizing Provider  acetaminophen (TYLENOL) 325 MG tablet Take 2 tablets (650 mg total) by mouth every 6 (six) hours as needed for mild pain, fever or headache. 11/04/22  Yes Darlin Priestly, MD  alendronate (FOSAMAX) 70 MG tablet Take 1 tablet (70 mg total) by mouth once a week. Take with a full glass of water on an empty stomach. 11/04/22  Yes Darlin Priestly, MD  cetirizine (ZYRTEC) 10 MG tablet Take 1 tablet (10 mg total) by mouth daily. 11/04/22  Yes Darlin Priestly, MD  cholecalciferol (VITAMIN D) 1000 units tablet Take 2 tablets (2,000 Units  total) by mouth daily. 11/04/22  Yes Darlin Priestly, MD  cloZAPine (CLOZARIL) 100 MG tablet Take 2 tablets (200 mg total) by mouth at bedtime. Patient taking differently: Take 100 mg by mouth 2 (two) times daily. 11/04/22  Yes Darlin Priestly, MD  coal tar-salicylic acid 2 % shampoo Use to wash scalp Tuesday, Thursday and Saturdays. 11/04/22  Yes Darlin Priestly, MD  docusate sodium (COLACE) 100 MG capsule Take 1 capsule (100 mg total) by mouth 2 (two) times daily. 11/04/22  Yes Darlin Priestly, MD  fluvoxaMINE (LUVOX) 50 MG tablet Take 1 tablet (50 mg total) by mouth 2 (two) times daily. 11/04/22  Yes Darlin Priestly, MD  glycerin adult 2 g suppository Place 1 suppository rectally as needed for constipation. 11/04/22  Yes Darlin Priestly, MD  hydrOXYzine (ATARAX) 25 MG tablet Take 1 tablet (25 mg total) by mouth 2 (two) times daily. 11/04/22  Yes Darlin Priestly, MD  ketoconazole (NIZORAL) 2 % shampoo APPLY TO AFFECTED AREA TWICE A WEEK FOR 8 WEEKS; THEN USE AS NEEDED. 11/04/22  Yes Darlin Priestly, MD  letrozole [facility]) 2.5 MG tablet Take 1 tablet (2.5 mg total) by mouth daily. 11/04/22  Yes Darlin Priestly, MD  Karlene Einstein  145 MCG CAPS capsule Take 1 capsule (145 mcg total) by mouth daily. 11/04/22  Yes Darlin Priestly, MD  liver oil-zinc oxide (DESITIN) 40 % ointment Apply topically as needed for irritation. 11/04/22  Yes Darlin Priestly, MD  melatonin 3 MG TABS tablet Take 6 mg by mouth at bedtime.   Yes [provider]  montelukast (SINGULAIR) 5 MG chewable tablet Chew 1 tablet (5 mg total) by mouth at bedtime. 11/04/22  Yes Darlin Priestly, MD  omeprazole (PRILOSEC) 20 MG capsule Take 1 capsule (20 mg total) by mouth daily. 11/04/22  Yes Darlin Priestly, MD  ondansetron (ZOFRAN) 4 MG tablet Take 4 mg by mouth every 6 (six) hours as needed for nausea or vomiting. 07/22/23  Yes [provider]  simvastatin (ZOCOR) 20 MG tablet Take 1 tablet (20 mg total) by mouth daily. 11/04/22  Yes Darlin Priestly, MD  traZODone (DESYREL) 50 MG tablet Take 25 mg by mouth at bedtime as  needed. 07/26/23  Yes [provider]  dicyclomine (BENTYL) 10 MG/5ML solution Take 5 mLs (10 mg total) by mouth 3 (three) times daily as needed (abdominal pain / cramping). Patient not taking: Reported on 07/27/2023 11/04/22   Darlin Priestly, MD  polyethylene glycol (MIRALAX / GLYCOLAX) 17 g packet Take 17 g by mouth daily. Patient not taking: Reported on 07/27/2023 11/04/22   Darlin Priestly, MD     Critical care time: 65 minutes     Betsey Holiday, AGACNP-BC Acute Care Nurse Practitioner [facility]   [phone] / [phone] Please see Amion for details.

## 2023-07-28 NOTE — Assessment & Plan Note (Addendum)
 Took a while to wake her up but when she woke up she was able to answer questions.

## 2023-07-28 NOTE — Assessment & Plan Note (Addendum)
 Severe hypokalemia, hypophosphatemia hypocalcemia, hyponatremia.  Replace potassium.  Phosphorus magnesium and sodium in the normal range.

## 2023-07-28 NOTE — Assessment & Plan Note (Addendum)
 Secondary to sepsis on presentation

## 2023-07-28 NOTE — Assessment & Plan Note (Signed)
 On letrozole treatment

## 2023-07-28 NOTE — Hospital Course (Addendum)
 73 y.o. female with medical history significant of bipolar affective disorder, breast cancer, anxiety/depression, HLD, hypothyroidism, presented with altered mentations.   Patient was found lethargy and somewhat confused this morning and facility staff called EMS.  Patient reported she has been having diarrhea for the last 4 to 5 days, 2-3 times a day, associated with periumbilical pain but denied any tenesmus no fever or chills no nausea vomiting.  She has been having cycles of constipation and diarrhea lately and has been taking as needed stool softener laxative for constipation and Imodium for diarrhea.  Patient denies any dysuria urinary frequency no back pain.   ED Course: Afebrile, nontachycardic blood pressure 89/71 improved to 113/80 after once 500 mL IV bolus in the ED. blood work showed K2.3, creatinine 2.3 bicarb 22, BUN 10 creatinine 2.3 WBC 7.9 hemoglobin 13.7.  UA showed 3+ WBC.  Negative for nitrite     4/9.  Patient has lethargic this morning but was able to wake up little bit more with nursing staff and take medications.  CT scan of the head negative.  Right upper quadrant sonogram showed distended gallbladder with some sludge no wall thickening. 4/10.  Patient complains of nausea and some abdominal pain.  Started on nausea medication and increase Protonix to twice daily.  CT scan of the abdomen showed diffuse dilation but no wall thickening or definitive obstruction may be due to ileus or enteritis, suspicion of mild areas of colon wall thickening and mucosal enhancement question of mild colitis, marked rectal distention by impacted appearing feces, slightly distended gallbladder with suspicion of gallbladder wall thickening suggest correlation with ultrasound. 4/11.  Patient complained of right upper quadrant pain.  Patient had bowel movements with enema and MiraLAX. 412.  Continue MiraLAX.  Patient having less abdominal pain today.

## 2023-07-28 NOTE — Progress Notes (Signed)
 Progress Note   Patient: Selena Pittman ZOX:[id number] DOB: 1950-08-22 DOA: 07/27/2023     1 DOS: the patient was seen and examined on 07/28/2023   Brief hospital course: 74 y.o. female with medical history significant of bipolar affective disorder, breast cancer, anxiety/depression, HLD, hypothyroidism, presented with altered mentations.   Patient was found lethargy and somewhat confused this morning and facility staff called EMS.  Patient reported she has been having diarrhea for the last 4 to 5 days, 2-3 times a day, associated with periumbilical pain but denied any tenesmus no fever or chills no nausea vomiting.  She has been having cycles of constipation and diarrhea lately and has been taking as needed stool softener laxative for constipation and Imodium for diarrhea.  Patient denies any dysuria urinary frequency no back pain.   ED Course: Afebrile, nontachycardic blood pressure 89/71 improved to 113/80 after once 500 mL IV bolus in the ED. blood work showed K2.3, creatinine 2.3 bicarb 22, BUN 10 creatinine 2.3 WBC 7.9 hemoglobin 13.7.  UA showed 3+ WBC.  Negative for nitrite   Ceftriaxone x 1 given in the ED.  4/9.  Patient has lethargic this morning but was able to wake up little bit more with nursing staff and take medications.  CT scan of the head ordered but still pending.  Assessment and Plan: * Septic shock (HCC) Hypotension requiring pressors.  Patient now off pressors.  On midodrine.  Patient received fluid boluses.  Acute cystitis with hematuria.  Now on Maxipime.  Patient had hypothermia and initial tachycardia.  Acute metabolic encephalopathy Likely secondary to urinary tract infection.  Electrolyte abnormality Severe hypokalemia and also hypocalcemia.  Electrolyte protocol ordered.  Diarrhea Stool studies ordered  Prolonged QT interval Likely with electrolyte abnormality  Bipolar disorder (HCC) Patient on Clozaril  History of breast cancer On letrozole  treatment  Metabolic acidosis Secondary to diarrhea.        Subjective: Patient lethargic this morning.  Admitted with septic shock secondary to urinary infection.  Physical Exam: Vitals:   07/28/23 1030 07/28/23 1100 07/28/23 1130 07/28/23 1200  BP: 93/69 106/71 106/74 102/75  Pulse: 65 69 66 66  Resp: 13 14 13 13   Temp:    (!) 96.3 F (35.7 C)  TempSrc:    Axillary  SpO2: 100% 100% 100% 100%  Weight:      Height:       Physical Exam HENT:     Head: Normocephalic.     Mouth/Throat:     Pharynx: No oropharyngeal exudate.  Eyes:     General: Lids are normal.     Conjunctiva/sclera: Conjunctivae normal.  Cardiovascular:     Rate and Rhythm: Normal rate and regular rhythm.     Heart sounds: Normal heart sounds, S1 normal and S2 normal.  Pulmonary:     Breath sounds: No decreased breath sounds, wheezing, rhonchi or rales.  Abdominal:     Palpations: Abdomen is soft.     Tenderness: There is no abdominal tenderness.  Musculoskeletal:     Right lower leg: No swelling.     Left lower leg: No swelling.  Skin:    General: Skin is warm.     Findings: No rash.  Neurological:     Mental Status: She is lethargic.     Data Reviewed: CT head still pending read Potassium 2.7, CO2 16, creatinine 0.3, calcium 5.6, alkaline phosphatase 186, AST 67, ALT 87, white blood count 5.7, hemoglobin 10.3, platelet count 165  Family Communication: Updated  son on the phone  Disposition: Status is: Inpatient Remains inpatient appropriate because: Patient on midodrine to maintain blood pressure continue IV antibiotics for septic shock  Planned Discharge Destination: To be determined    Time spent: 28 minutes  Author: Alford Highland, MD 07/28/2023 2:11 PM  For on call review [email or web address].

## 2023-07-28 NOTE — Significant Event (Signed)
 Rapid Response Event Note   Reason for Call :  Hypotension Initial Focused Assessment:  Patients last cycled BP 51/39(45). Patient Alert and following commands answering questions appropriately. Patients BP not recycling on monitor. Unable to get a manual blood pressure 3 nurses attempted and MD attempted. NS bolus started wide open and Levo Per MD Mansy. Patients bp improved to 102/64. Patient awaiting bed to be cleaned in ICU then will be transferred. Primary nurse encouraged to reach out if any changes occurs     Interventions:  -Fluids -Levo -Antibiotics -Labs  :   MD NotifiedArville Care MD Call [phone] Arrival Time:2340 End [id number]  Judyann Munson, RN

## 2023-07-28 NOTE — Assessment & Plan Note (Deleted)
 Likely overflow from constipation.  Restarted stool softeners.  Gave a suppository today and had a bowel movement.  Comprehensive panel negative

## 2023-07-28 NOTE — Assessment & Plan Note (Addendum)
 Hypotension requiring pressors initially.  Discontinue midodrine on 4/12.  Patient received fluid boluses.  Acute cystitis with hematuria.  With ESBL E. coli growing out of urine culture placed on meropenem on 4/11.  Patient had hypothermia and initial tachycardia.

## 2023-07-28 NOTE — Progress Notes (Signed)
 Notified Britton L, NP of mg 1.2 and ph <1.

## 2023-07-28 NOTE — Progress Notes (Signed)
 Chaplain responds to Rapid Response and finds pt with altered mental status and BP bottomed out, necessitating RR. Chaplain provides compassionate presence as medical team cares for pt. No family present.

## 2023-07-28 NOTE — Progress Notes (Signed)
 eLink Physician-Brief Progress Note Patient Name: Selena Pittman DOB: [birth year] MRN: [id number]   Date of Service  07/28/2023  HPI/Events of Note  Patient admitted with suspected sepsis, work up is in progress. Patient is on low gtt Levophed infusion.  eICU Interventions  New Patient Evaluation.        Katrianna Friesenhahn U Leigha Olberding 07/28/2023, 1:48 AM

## 2023-07-28 NOTE — Assessment & Plan Note (Signed)
 Likely with electrolyte abnormality

## 2023-07-28 NOTE — Progress Notes (Signed)
 Pharmacy Antibiotic Note  Selena Pittman is a 73 y.o. female admitted on 07/27/2023 with sepsis and UTI.  Pharmacy has been consulted for Vanc, Cefepime dosing.  Plan: Cefepime 2 gm IV Q12H ordered to start on 4/9 @ 0100.  Vancomycin 1 gm IV X 1 loading dose ordered for 4/9 @ 0100. Vancomycin 1250 mg IV Q48H ordered to start on 4/11 @ 0100.  AUC = 478.5 Vanc trough = 6.7   Height: 5\' 1"  (154.9 cm) Weight: 43.8 kg (96 lb 9.6 oz) IBW/kg (Calculated) : 47.8  Temp (24hrs), Avg:97.7 F (36.5 C), Min:97.6 F (36.4 C), Max:97.8 F (36.6 C)  Recent Labs  Lab 07/27/23 1240 07/27/23 1550  WBC 7.9  --   CREATININE 0.36*  --   LATICACIDVEN  --  0.7    Estimated Creatinine Clearance: 44.6 mL/min (A) (by C-G formula based on SCr of 0.36 mg/dL (L)).    Allergies  Allergen Reactions   Cat Dander    Milk-Related Compounds    Mixed Ragweed    Peanut-Containing Drug Products     Antimicrobials this admission:   >>    >>   Dose adjustments this admission:   Microbiology results:  BCx:   UCx:    Sputum:    MRSA PCR:   Thank you for allowing pharmacy to be a part of this patient's care.  Dyanara Cozza D 07/28/2023 12:45 AM

## 2023-07-28 NOTE — Plan of Care (Signed)
  Problem: Fluid Volume: Goal: Hemodynamic stability will improve Outcome: Progressing   Problem: Clinical Measurements: Goal: Diagnostic test results will improve Outcome: Progressing Goal: Signs and symptoms of infection will decrease Outcome: Progressing   Problem: Respiratory: Goal: Ability to maintain adequate ventilation will improve Outcome: Progressing   Problem: Education: Goal: Knowledge of General Education information will improve Description: Including pain rating scale, medication(s)/side effects and non-pharmacologic comfort measures Outcome: Progressing   Problem: Clinical Measurements: Goal: Ability to maintain clinical measurements within normal limits will improve Outcome: Progressing Goal: Will remain free from infection Outcome: Progressing Goal: Diagnostic test results will improve Outcome: Progressing Goal: Respiratory complications will improve Outcome: Progressing Goal: Cardiovascular complication will be avoided Outcome: Progressing   Problem: Activity: Goal: Risk for activity intolerance will decrease Outcome: Progressing   Problem: Nutrition: Goal: Adequate nutrition will be maintained Outcome: Progressing   Problem: Coping: Goal: Level of anxiety will decrease Outcome: Progressing   Problem: Elimination: Goal: Will not experience complications related to bowel motility Outcome: Progressing Goal: Will not experience complications related to urinary retention Outcome: Progressing   Problem: Pain Managment: Goal: General experience of comfort will improve and/or be controlled Outcome: Progressing   Problem: Safety: Goal: Ability to remain free from injury will improve Outcome: Progressing   Problem: Skin Integrity: Goal: Risk for impaired skin integrity will decrease Outcome: Progressing

## 2023-07-28 NOTE — Assessment & Plan Note (Signed)
 Patient on Clozaril

## 2023-07-29 ENCOUNTER — Inpatient Hospital Stay

## 2023-07-29 DIAGNOSIS — R6521 Severe sepsis with septic shock: Secondary | ICD-10-CM | POA: Diagnosis not present

## 2023-07-29 DIAGNOSIS — A419 Sepsis, unspecified organism: Secondary | ICD-10-CM | POA: Diagnosis not present

## 2023-07-29 LAB — COMPREHENSIVE METABOLIC PANEL WITH GFR
ALT: 76 U/L — ABNORMAL HIGH (ref 0–44)
AST: 68 U/L — ABNORMAL HIGH (ref 15–41)
Albumin: 3.4 g/dL — ABNORMAL LOW (ref 3.5–5.0)
Alkaline Phosphatase: 154 U/L — ABNORMAL HIGH (ref 38–126)
Anion gap: 8 (ref 5–15)
BUN: <5 mg/dL — ABNORMAL LOW (ref 8–23)
CO2: 20 mmol/L — ABNORMAL LOW (ref 22–32)
Calcium: 7.3 mg/dL — ABNORMAL LOW (ref 8.9–10.3)
Chloride: 108 mmol/L (ref 98–111)
Creatinine, Ser: <0.3 mg/dL — ABNORMAL LOW (ref 0.44–1.00)
Glucose, Bld: 85 mg/dL (ref 70–99)
Potassium: 3.4 mmol/L — ABNORMAL LOW (ref 3.5–5.1)
Sodium: 136 mmol/L (ref 135–145)
Total Bilirubin: 0.8 mg/dL (ref 0.0–1.2)
Total Protein: 4.9 g/dL — ABNORMAL LOW (ref 6.5–8.1)

## 2023-07-29 LAB — URINE CULTURE: Culture: >=100000 — AB

## 2023-07-29 LAB — MAGNESIUM: Magnesium: 2.4 mg/dL (ref 1.7–2.4)

## 2023-07-29 LAB — PHOSPHORUS: Phosphorus: 2.4 mg/dL — ABNORMAL LOW (ref 2.5–4.6)

## 2023-07-29 MED ORDER — PROMETHAZINE (PHENERGAN) 6.25MG IN NS 50ML IVPB
6.2500 mg | Freq: Four times a day (QID) | INTRAVENOUS | Status: DC | PRN
  Administered 2023-07-30: 6.25 mg via INTRAVENOUS
  Filled 2023-07-29: qty 50

## 2023-07-29 MED ORDER — PANTOPRAZOLE SODIUM 40 MG PO TBEC
40.0000 mg | DELAYED_RELEASE_TABLET | Freq: Two times a day (BID) | ORAL | Status: DC
  Administered 2023-07-29 – 2023-08-03 (×10): 40 mg via ORAL
  Filled 2023-07-29 (×10): qty 1

## 2023-07-29 MED ORDER — ORAL CARE MOUTH RINSE
15.0000 mL | OROMUCOSAL | Status: DC
  Administered 2023-07-29 – 2023-08-03 (×18): 15 mL via OROMUCOSAL

## 2023-07-29 MED ORDER — IOHEXOL 300 MG/ML  SOLN
100.0000 mL | Freq: Once | INTRAMUSCULAR | Status: AC | PRN
  Administered 2023-07-29: 100 mL via INTRAVENOUS

## 2023-07-29 MED ORDER — ORAL CARE MOUTH RINSE: 15.0000 mL | OROMUCOSAL | Status: DC | PRN

## 2023-07-29 MED ORDER — MIDODRINE HCL 5 MG PO TABS
2.5000 mg | ORAL_TABLET | Freq: Three times a day (TID) | ORAL | Status: DC
  Administered 2023-07-29 – 2023-07-30 (×5): 2.5 mg via ORAL
  Filled 2023-07-29 (×5): qty 1

## 2023-07-29 MED ORDER — POTASSIUM CHLORIDE CRYS ER 20 MEQ PO TBCR
40.0000 meq | EXTENDED_RELEASE_TABLET | Freq: Once | ORAL | Status: AC
  Administered 2023-07-29: 40 meq via ORAL
  Filled 2023-07-29: qty 2

## 2023-07-29 MED ORDER — IOHEXOL 9 MG/ML PO SOLN
500.0000 mL | ORAL | Status: AC
  Administered 2023-07-29 (×2): 500 mL via ORAL

## 2023-07-29 MED ORDER — K PHOS MONO-SOD PHOS DI & MONO 155-852-130 MG PO TABS
500.0000 mg | ORAL_TABLET | ORAL | Status: AC
  Administered 2023-07-29 (×2): 500 mg via ORAL
  Filled 2023-07-29 (×2): qty 2

## 2023-07-29 NOTE — Progress Notes (Signed)
 Progress Note   Patient: Selena Pittman OZH:[id number] DOB: 03/25/1951 DOA: 07/27/2023     2 DOS: the patient was seen and examined on 07/29/2023   Brief hospital course: 73 y.o. female with medical history significant of bipolar affective disorder, breast cancer, anxiety/depression, HLD, hypothyroidism, presented with altered mentations.   Patient was found lethargy and somewhat confused this morning and facility staff called EMS.  Patient reported she has been having diarrhea for the last 4 to 5 days, 2-3 times a day, associated with periumbilical pain but denied any tenesmus no fever or chills no nausea vomiting.  She has been having cycles of constipation and diarrhea lately and has been taking as needed stool softener laxative for constipation and Imodium for diarrhea.  Patient denies any dysuria urinary frequency no back pain.   ED Course: Afebrile, nontachycardic blood pressure 89/71 improved to 113/80 after once 500 mL IV bolus in the ED. blood work showed K2.3, creatinine 2.3 bicarb 22, BUN 10 creatinine 2.3 WBC 7.9 hemoglobin 13.7.  UA showed 3+ WBC.  Negative for nitrite   Ceftriaxone x 1 given in the ED.  4/9.  Patient has lethargic this morning but was able to wake up little bit more with nursing staff and take medications.  CT scan of the head negative. 4/10.  Patient complains of nausea and some abdominal pain.  Started on nausea medication and increase Protonix to twice daily.  Assessment and Plan: * Septic shock (HCC) Hypotension requiring pressors initially.  Patient now off pressors.  On midodrine.  Will titrate down to 2.5 mg 3 times daily and hopefully off soon.  Patient received fluid boluses.  Acute cystitis with hematuria.  Now on Maxipime.  Patient had hypothermia and initial tachycardia.  Acute metabolic encephalopathy Mental status better today able to communicate.  Electrolyte abnormality Severe hypokalemia and also hypocalcemia, hyponatremia.  Electrolyte  protocol ordered.  Sodium now in the normal range.  Diarrhea Stool studies ordered.  Has not had a bowel movement.  Abdominal pain Increase Protonix to twice daily.  Could be secondary to diarrheal illness.  Continue empiric antibiotics for right now.  As needed nausea medication.  Will get abdominal x-ray  Prolonged QT interval Likely with electrolyte abnormality  Bipolar disorder (HCC) Patient on Clozaril  History of breast cancer On letrozole treatment  Malnutrition of moderate degree Continue supplements  Metabolic acidosis Secondary to diarrhea.        Subjective: Patient complains of nausea.  Some abdominal discomfort.  No vomiting.  Admitted with diarrhea.  Physical Exam: Vitals:   07/29/23 0830 07/29/23 0900 07/29/23 1000 07/29/23 1100  BP:  127/73 113/65 111/65  Pulse: 76 78 84 80  Resp: 12 14 11 13   Temp:      TempSrc:      SpO2: 98% 98% 99% 98%  Weight:      Height:       Physical Exam HENT:     Head: Normocephalic.     Mouth/Throat:     Pharynx: No oropharyngeal exudate.  Eyes:     General: Lids are normal.     Conjunctiva/sclera: Conjunctivae normal.  Cardiovascular:     Rate and Rhythm: Normal rate and regular rhythm.     Heart sounds: Normal heart sounds, S1 normal and S2 normal.  Pulmonary:     Breath sounds: No decreased breath sounds, wheezing, rhonchi or rales.  Abdominal:     Palpations: Abdomen is soft.     Tenderness: There is abdominal tenderness in  the left upper quadrant and left lower quadrant.  Musculoskeletal:     Right lower leg: No swelling.     Left lower leg: No swelling.     Comments: Contractions right hand.  Skin:    General: Skin is warm.     Findings: No rash.  Neurological:     Mental Status: She is alert.     Data Reviewed: Creatinine 0.3, potassium 3.4, phosphorus 2.4, alkaline phosphatase 154, AST 68 ALT 76  Family Communication: Updated son on phone  Disposition: Status is: Inpatient Remains  inpatient appropriate because: Transfer out of ICU to MedSurg.  Follow-up urine culture.  Continue IV antibiotics.  PT evaluation.  Planned Discharge Destination: Long-term care    Time spent: 28 minutes  Author: Alford Highland, MD 07/29/2023 11:23 AM  For on call review [email or web address].

## 2023-07-29 NOTE — Progress Notes (Addendum)
   07/29/23 1454  Home Living  Family/patient expects to be discharged to: Assisted living [facility] >7 months per son.)  Prior Function  Prior Level of Function  Total care  Mobility Comments per son, pt is not very mobile, except when nursing is assiting with care needs.  ADLs Comments Pt requires nursing assistance with bathing, dressing, toileting; can feed self if meal is possitioned appropriately and items are prepared for easy self-surface.    Author contacted son to obtain baseline mobility and ADL independence, all information provided as above.   RN consulted, reports attempted mobility with staff on unit today, consistent with report provided by son. No acute PT evaluation warranted at this time. Will sign off. Thank you for this consult.   3:01 PM, 07/29/23 Rosamaria Lints, PT, DPT Physical Therapist - [facility]  [phone] [facility])

## 2023-07-29 NOTE — Assessment & Plan Note (Signed)
 Continue supplements

## 2023-07-29 NOTE — Assessment & Plan Note (Addendum)
 Today's pain is in the left lower quadrant.  Appreciate surgery reviewing her imaging of the gallbladder.  Continue to monitor.  Liver function test trending better.  Increased Protonix to twice daily.  Could be secondary to constipation.

## 2023-07-30 DIAGNOSIS — G9341 Metabolic encephalopathy: Secondary | ICD-10-CM | POA: Diagnosis not present

## 2023-07-30 DIAGNOSIS — E878 Other disorders of electrolyte and fluid balance, not elsewhere classified: Secondary | ICD-10-CM | POA: Diagnosis not present

## 2023-07-30 DIAGNOSIS — A419 Sepsis, unspecified organism: Secondary | ICD-10-CM | POA: Diagnosis not present

## 2023-07-30 DIAGNOSIS — E440 Moderate protein-calorie malnutrition: Secondary | ICD-10-CM

## 2023-07-30 DIAGNOSIS — F319 Bipolar disorder, unspecified: Secondary | ICD-10-CM

## 2023-07-30 DIAGNOSIS — R1011 Right upper quadrant pain: Secondary | ICD-10-CM

## 2023-07-30 LAB — CBC WITH DIFFERENTIAL/PLATELET
Abs Immature Granulocytes: 0.06 10*3/uL (ref 0.00–0.07)
Basophils Absolute: 0 10*3/uL (ref 0.0–0.1)
Basophils Relative: 0 %
Eosinophils Absolute: 0.1 10*3/uL (ref 0.0–0.5)
Eosinophils Relative: 1 %
HCT: 29.1 % — ABNORMAL LOW (ref 36.0–46.0)
Hemoglobin: 10.4 g/dL — ABNORMAL LOW (ref 12.0–15.0)
Immature Granulocytes: 1 %
Lymphocytes Relative: 18 %
Lymphs Abs: 0.9 10*3/uL (ref 0.7–4.0)
MCH: 30.8 pg (ref 26.0–34.0)
MCHC: 35.7 g/dL (ref 30.0–36.0)
MCV: 86.1 fL (ref 80.0–100.0)
Monocytes Absolute: 0.5 10*3/uL (ref 0.1–1.0)
Monocytes Relative: 11 %
Neutro Abs: 3.1 10*3/uL (ref 1.7–7.7)
Neutrophils Relative %: 69 %
Platelets: 203 10*3/uL (ref 150–400)
RBC: 3.38 MIL/uL — ABNORMAL LOW (ref 3.87–5.11)
RDW: 17.3 % — ABNORMAL HIGH (ref 11.5–15.5)
WBC: 4.6 10*3/uL (ref 4.0–10.5)
nRBC: 0 % (ref 0.0–0.2)

## 2023-07-30 LAB — GASTROINTESTINAL PANEL BY PCR, STOOL (REPLACES STOOL CULTURE)

## 2023-07-30 LAB — MAGNESIUM: Magnesium: 2.2 mg/dL (ref 1.7–2.4)

## 2023-07-30 LAB — PHOSPHORUS: Phosphorus: 2 mg/dL — ABNORMAL LOW (ref 2.5–4.6)

## 2023-07-30 MED ORDER — POLYETHYLENE GLYCOL 3350 17 G PO PACK
17.0000 g | PACK | Freq: Two times a day (BID) | ORAL | Status: DC
  Administered 2023-07-30 – 2023-08-01 (×5): 17 g via ORAL
  Filled 2023-07-30 (×5): qty 1

## 2023-07-30 MED ORDER — BISACODYL 10 MG RE SUPP
10.0000 mg | Freq: Once | RECTAL | Status: AC
  Administered 2023-07-30: 10 mg via RECTAL
  Filled 2023-07-30: qty 1

## 2023-07-30 MED ORDER — SODIUM CHLORIDE 0.9 % IV SOLN
1.0000 g | Freq: Three times a day (TID) | INTRAVENOUS | Status: DC
  Administered 2023-07-30 – 2023-08-02 (×10): 1 g via INTRAVENOUS
  Filled 2023-07-30 (×11): qty 20

## 2023-07-30 MED ORDER — K PHOS MONO-SOD PHOS DI & MONO 155-852-130 MG PO TABS
500.0000 mg | ORAL_TABLET | Freq: Three times a day (TID) | ORAL | Status: AC
Start: 2023-07-30 — End: 2023-07-31
  Administered 2023-07-30 – 2023-07-31 (×3): 500 mg via ORAL
  Filled 2023-07-30 (×3): qty 2

## 2023-07-30 MED ORDER — NYSTATIN 100000 UNIT/GM EX POWD
Freq: Three times a day (TID) | CUTANEOUS | Status: DC
  Filled 2023-07-30: qty 15

## 2023-07-30 NOTE — Care Management Important Message (Signed)
 Important Message  Patient Details  Name: Selena Pittman MRN: [id number] Date of Birth: 1951-03-13   Important Message Given:  Yes - Medicare IM     Marcell Anger 07/30/2023, 11:21 AM

## 2023-07-30 NOTE — Progress Notes (Signed)
 Progress Note   Patient: Selena Pittman [id number]:[id number] DOB: 06-17-1950 DOA: 07/27/2023     3 DOS: the patient was seen and examined on 07/30/2023   Brief hospital course: 73 y.o. female with medical history significant of bipolar affective disorder, breast cancer, anxiety/depression, HLD, hypothyroidism, presented with altered mentations.   Patient was found lethargy and somewhat confused this morning and facility staff called EMS.  Patient reported she has been having diarrhea for the last 4 to 5 days, 2-3 times a day, associated with periumbilical pain but denied any tenesmus no fever or chills no nausea vomiting.  She has been having cycles of constipation and diarrhea lately and has been taking as needed stool softener laxative for constipation and Imodium for diarrhea.  Patient denies any dysuria urinary frequency no back pain.   ED Course: Afebrile, nontachycardic blood pressure 89/71 improved to 113/80 after once 500 mL IV bolus in the ED. blood work showed K2.3, creatinine 2.3 bicarb 22, BUN 10 creatinine 2.3 WBC 7.9 hemoglobin 13.7.  UA showed 3+ WBC.  Negative for nitrite     4/9.  Patient has lethargic this morning but was able to wake up little bit more with nursing staff and take medications.  CT scan of the head negative.  Right upper quadrant sonogram showed distended gallbladder with some sludge no wall thickening. 4/10.  Patient complains of nausea and some abdominal pain.  Started on nausea medication and increase Protonix to twice daily.  CT scan of the abdomen showed diffuse dilation but no wall thickening or definitive obstruction may be due to ileus or enteritis, suspicion of mild areas of colon wall thickening and mucosal enhancement question of mild colitis, marked rectal distention by impacted appearing feces, slightly distended gallbladder with suspicion of gallbladder wall thickening suggest correlation with ultrasound. 4/11.  Patient complained of right upper quadrant  pain.  Patient had bowel movements with enema and MiraLAX.  Assessment and Plan: * Septic shock (HCC) Hypotension requiring pressors initially.  Patient off pressors.  On midodrine, low-dose 3 times daily.  Patient received fluid boluses.  Acute cystitis with hematuria.  With ESBL growing out of urine culture placed on meropenem.  Patient had hypothermia and initial tachycardia.  Acute metabolic encephalopathy Mental status better today last 2 days  Electrolyte abnormality Severe hypokalemia, hypophosphatemia hypocalcemia, hyponatremia.  Electrolyte protocol ordered.  Sodium now in the normal range.  Replace K-Phos for 3 doses.  Diarrhea Likely overflow from constipation.  Restarted stool softeners.  Gave a suppository today and had a bowel movement.  Comprehensive panel negative  Abdominal pain Today's pain is localized in the right upper quadrant.  Surgical evaluation to look at the gallbladder.  Recheck liver function test tomorrow.  Patient on antibiotics that would cover the gallbladder.  Increased Protonix to twice daily.  Could be secondary to constipation.    Prolonged QT interval Likely with electrolyte abnormality  Bipolar disorder (HCC) Patient on Clozaril  History of breast cancer On letrozole treatment  Malnutrition of moderate degree Continue supplements  Metabolic acidosis Secondary to diarrhea.        Subjective: Patient complains abdominal pain more in the right upper quadrant today.  Patient had bowel movements today with medications.  Physical Exam: Vitals:   07/30/23 0409 07/30/23 0447 07/30/23 0801 07/30/23 1102  BP: 120/82  119/82 112/88  Pulse: 74  76   Resp: 18  18   Temp: 98.5 F (36.9 C)  (!) 97.4 F (36.3 C)   TempSrc: Oral  SpO2: 97%  97%   Weight:  50.1 kg    Height:       Physical Exam HENT:     Head: Normocephalic.     Mouth/Throat:     Pharynx: No oropharyngeal exudate.  Eyes:     General: Lids are normal.      Conjunctiva/sclera: Conjunctivae normal.  Cardiovascular:     Rate and Rhythm: Normal rate and regular rhythm.     Heart sounds: Normal heart sounds, S1 normal and S2 normal.  Pulmonary:     Breath sounds: No decreased breath sounds, wheezing, rhonchi or rales.  Abdominal:     Palpations: Abdomen is soft.     Tenderness: There is abdominal tenderness in the right upper quadrant.  Musculoskeletal:     Right lower leg: No swelling.     Left lower leg: No swelling.     Comments: Contractions right hand.  Skin:    General: Skin is warm.     Findings: No rash.  Neurological:     Mental Status: She is alert.     Data Reviewed: Phosphorus 2, magnesium 2.2, white blood count 4.6, hemoglobin 10.4, platelet count 203.  Family Communication: Updated son on the phone  Disposition: Status is: Inpatient Remains inpatient appropriate because: Switched antibiotics to meropenem with ESBL E. coli growing out of the urine culture.  Continue to treat constipation.  Planned Discharge Destination: Home    Time spent: 28 minutes  Author: Alford Highland, MD 07/30/2023 3:41 PM  For on call review [email or web address].

## 2023-07-30 NOTE — Consult Note (Signed)
 Subjective:   CC: Abdominal pain  HPI:  Selena Pittman is a 73 y.o. female who was consulted by [facility] for evaluation of above.    Per report, patient presented with some weakness and questionable abdominal pain.  Workup noted ESBL UTI, but due to the episodic abdominal pain, further workup performed as noted below.  Patient unable to give full history secondary to baseline mentation.  She did verbalize she has some abdominal pain    Past Medical History:  has a past medical history of Bipolar affective (HCC), Breast cancer (HCC), Cancer (HCC) (12/11/2014), Cardiomegaly, Depression, Emphysema of lung (HCC), Endometriosis, Hyperlipidemia, Osteoporosis, Schizoaffective disorder (HCC), Schizophrenia (HCC), Severe sepsis (HCC) (04/02/2022), Thyroid nodule, and Vitamin D deficiency.  Past Surgical History:  has a past surgical history that includes DIAGNOSTIC MAMMOGRAM (12/04/2014); Tendon repair (Right); Dilation and curettage of uterus; Tubal ligation; Tonsillectomy; Abdominal hysterectomy; Breast lumpectomy with sentinel lymph node bx (Left, 12/31/2014); Simple mastectomy with axillary sentinel node biopsy (Left, 01/14/2015); Breast biopsy (Left, 12-11-14); and Mastectomy (Left, 2016).  Family History: family history includes Cancer in her mother; Heart attack in her father.  Social History:  reports that she has been smoking cigarettes. She has a 45 pack-year smoking history. She has never used smokeless tobacco. She reports that she does not drink alcohol and does not use drugs.  Current Medications:  Prior to Admission medications   Medication Sig Start Date End Date Taking? Authorizing Provider  acetaminophen (TYLENOL) 325 MG tablet Take 2 tablets (650 mg total) by mouth every 6 (six) hours as needed for mild pain, fever or headache. 11/04/22  Yes Darlin Priestly, MD  alendronate (FOSAMAX) 70 MG tablet Take 1 tablet (70 mg total) by mouth once a week. Take with a full glass of water on an empty  stomach. 11/04/22  Yes Darlin Priestly, MD  cetirizine (ZYRTEC) 10 MG tablet Take 1 tablet (10 mg total) by mouth daily. 11/04/22  Yes Darlin Priestly, MD  cholecalciferol (VITAMIN D) 1000 units tablet Take 2 tablets (2,000 Units total) by mouth daily. 11/04/22  Yes Darlin Priestly, MD  cloZAPine (CLOZARIL) 100 MG tablet Take 2 tablets (200 mg total) by mouth at bedtime. Patient taking differently: Take 100 mg by mouth 2 (two) times daily. 11/04/22  Yes Darlin Priestly, MD  coal tar-salicylic acid 2 % shampoo Use to wash scalp Tuesday, Thursday and Saturdays. 11/04/22  Yes Darlin Priestly, MD  docusate sodium (COLACE) 100 MG capsule Take 1 capsule (100 mg total) by mouth 2 (two) times daily. 11/04/22  Yes Darlin Priestly, MD  fluvoxaMINE (LUVOX) 50 MG tablet Take 1 tablet (50 mg total) by mouth 2 (two) times daily. 11/04/22  Yes Darlin Priestly, MD  glycerin adult 2 g suppository Place 1 suppository rectally as needed for constipation. 11/04/22  Yes Darlin Priestly, MD  hydrOXYzine (ATARAX) 25 MG tablet Take 1 tablet (25 mg total) by mouth 2 (two) times daily. 11/04/22  Yes Darlin Priestly, MD  ketoconazole (NIZORAL) 2 % shampoo APPLY TO AFFECTED AREA TWICE A WEEK FOR 8 WEEKS; THEN USE AS NEEDED. 11/04/22  Yes Darlin Priestly, MD  letrozole [facility]) 2.5 MG tablet Take 1 tablet (2.5 mg total) by mouth daily. 11/04/22  Yes Darlin Priestly, MD  LINZESS 145 MCG CAPS capsule Take 1 capsule (145 mcg total) by mouth daily. 11/04/22  Yes Darlin Priestly, MD  liver oil-zinc oxide (DESITIN) 40 % ointment Apply topically as needed for irritation. 11/04/22  Yes Darlin Priestly, MD  melatonin 3 MG TABS tablet  Take 6 mg by mouth at bedtime.   Yes [provider]  montelukast (SINGULAIR) 5 MG chewable tablet Chew 1 tablet (5 mg total) by mouth at bedtime. 11/04/22  Yes Darlin Priestly, MD  omeprazole (PRILOSEC) 20 MG capsule Take 1 capsule (20 mg total) by mouth daily. 11/04/22  Yes Darlin Priestly, MD  ondansetron (ZOFRAN) 4 MG tablet Take 4 mg by mouth every 6 (six) hours as needed for nausea or vomiting.  07/22/23  Yes [provider]  simvastatin (ZOCOR) 20 MG tablet Take 1 tablet (20 mg total) by mouth daily. 11/04/22  Yes Darlin Priestly, MD  traZODone (DESYREL) 50 MG tablet Take 25 mg by mouth at bedtime as needed. 07/26/23  Yes [provider]  dicyclomine (BENTYL) 10 MG/5ML solution Take 5 mLs (10 mg total) by mouth 3 (three) times daily as needed (abdominal pain / cramping). Patient not taking: Reported on 07/27/2023 11/04/22   Darlin Priestly, MD  polyethylene glycol (MIRALAX / GLYCOLAX) 17 g packet Take 17 g by mouth daily. Patient not taking: Reported on 07/27/2023 11/04/22   Darlin Priestly, MD    Allergies:  Allergies  Allergen Reactions   Cat Dander    Lactose Intolerance (Gi)     Diarrhea    Mixed Ragweed    Peanut-Containing Drug Products        Objective:     BP 111/75 (BP Location: Right Arm)   Pulse 91   Temp 98.8 F (37.1 C) (Oral)   Resp 19   Ht 5\' 5"  (1.651 m)   Wt 50.1 kg   SpO2 100%   BMI 18.38 kg/m   Constitutional :  alert, cooperative, appears stated age, and no distress  Lymphatics/Throat:  no asymmetry, masses, or scars  Respiratory:  clear to auscultation bilaterally  Cardiovascular:  regular rate and rhythm  Gastrointestinal: Soft, distended, tenderness to palpation in all 4 quadrants with no specific worsening in 1 area versus the other. .  Musculoskeletal: Steady gait and movement  Skin: Cool and moist  Psychiatric: Normal affect, non-agitated, not confused       LABS:     Latest Ref Rng & Units 07/29/2023    4:24 AM 07/28/2023   10:12 PM 07/28/2023    1:45 PM  CMP  Glucose 70 - 99 mg/dL 85  83  93   BUN 8 - 23 mg/dL <5  <5  5   Creatinine 0.44 - 1.00 mg/dL <4.13  <2.44  0.10   Sodium 135 - 145 mmol/L 136  134  135   Potassium 3.5 - 5.1 mmol/L 3.4  3.5  6.0   Chloride 98 - 111 mmol/L 108  108  110   CO2 22 - 32 mmol/L 20  19  19    Calcium 8.9 - 10.3 mg/dL 7.3  7.0  7.3   Total Protein 6.5 - 8.1 g/dL 4.9     Total Bilirubin 0.0 - 1.2 mg/dL  0.8     Alkaline Phos 38 - 126 U/L 154     AST 15 - 41 U/L 68     ALT 0 - 44 U/L 76         Latest Ref Rng & Units 07/30/2023    4:42 AM 07/28/2023    1:05 AM 07/27/2023   12:40 PM  CBC  WBC 4.0 - 10.5 K/uL 4.6  5.7  7.9   Hemoglobin 12.0 - 15.0 g/dL 27.2  53.6  64.4   Hematocrit 36.0 - 46.0 % 29.1  29.2  35.6   Platelets 150 - 400 K/uL 203  165  230     RADS: Narrative & Impression  CLINICAL DATA:  Left lower quadrant pain   EXAM: CT ABDOMEN AND PELVIS WITH CONTRAST   TECHNIQUE: Multidetector CT imaging of the abdomen and pelvis was performed using the standard protocol following bolus administration of intravenous contrast.   RADIATION DOSE REDUCTION: This exam was performed according to the departmental dose-optimization program which includes automated exposure control, adjustment of the mA and/or kV according to patient size and/or use of iterative reconstruction technique.   CONTRAST:  OMNIPAQUE IOHEXOL 300 MG/ML  SOLN   COMPARISON:  Radiograph 07/29/2023, CT 10/01/2022   FINDINGS: Lower chest: Lung bases demonstrate small right greater than left pleural effusions. Partial lower lobe consolidations probably due to passive atelectasis. Aortic atherosclerosis.   Hepatobiliary: Slightly distended gallbladder. No calcified stone. Suspicion of gallbladder wall thickening or pericholecystic fluid. Prominent common bile duct measuring up to 9 mm with mild smooth ductal enhancement, coronal series 5, image 37.   Pancreas: Unremarkable. No pancreatic ductal dilatation or surrounding inflammatory changes.   Spleen: Normal in size without focal abnormality.   Adrenals/Urinary Tract: Adrenal glands are normal. Kidneys show prominent bilateral extrarenal pelvises. The bladder contains small focus of gas   Stomach/Bowel: The stomach is nonenlarged. Contrast filled small bowel with diffuse dilatation but no wall thickening or definitive obstruction. Suspicion of  mild areas of colon wall thickening and mucosal enhancement, for example hepatic flexure on series 2, image 41, ascending colon on coronal series 5, image 29. Moderate rectal distension by impacted appearing feces.   Vascular/Lymphatic: Aortic atherosclerosis. No enlarged abdominal or pelvic lymph nodes.   Reproductive: Hysterectomy.  No adnexal mass   Other: No free air. Small volume free fluid within the abdomen and pelvis. Subcutaneous gas within the right anterior abdominal wall.   Musculoskeletal: Scoliosis and degenerative changes. 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 Multilevel degenerative changes.   IMPRESSION: 1. Contrast filled small bowel with diffuse dilatation but no wall thickening or definitive obstruction. Findings may be due to ileus or enteritis. 2. Suspicion of mild areas of colon wall thickening and mucosal enhancement, question mild colitis. Moderate to marked rectal distension by impacted appearing feces. 3. Slightly distended gallbladder with suspicion of gallbladder wall thickening or pericholecystic fluid. Suggest correlation with ultrasound. Prominent common bile duct measuring up to 9 mm with mild smooth ductal enhancement, correlate with LFTs. 4. Small volume free fluid within the abdomen and pelvis. 5. Small right greater than left pleural effusions with partial lower lobe consolidations probably due to passive atelectasis. 6. Small focus of gas within the bladder, question recent instrumentation. 7. Aortic atherosclerosis.     Electronically Signed   By: Jasmine Pang M.D.   On: 07/30/2023 00:54  CLINICAL DATA:  Transaminitis   EXAM: ULTRASOUND ABDOMEN LIMITED RIGHT UPPER QUADRANT   COMPARISON:  CT 10/01/2022.   FINDINGS: Gallbladder:   Dilated gallbladder. No wall thickening or adjacent fluid. There is some layering sludge. No shadowing stones.   Common bile duct:   Diameter: 5 mm   Liver:   No focal lesion identified. Within  normal limits in parenchymal echogenicity. Portal vein is patent on color Doppler imaging with normal direction of blood flow towards the liver.   Other: None.   IMPRESSION: Distended gallbladder with some sludge. No wall thickening. No shadowing stones. No ductal dilatation.     Electronically Signed   By: Karen Kays  M.D.   On: 07/28/2023 10:45    Assessment:    Abdominal pain, pain pattern not consistent with biliary etiology.  Ultrasound and CT both with no concerns of acute cholecystitis.  Transaminitis-minimal elevation with normal total bilirubin level.  AST ALT abnormality may potentially be medication related versus a biliary pathology.   Plan:     Due to the inconclusive imaging studies and atypical pain pattern for biliary etiology, do not recommend proceeding with any sort of surgical intervention at this time.  Recommend continuing IV antibiotics for now for her UTI which will also provide coverage for biliary issues if there is any present.  Continue to monitor for any changes in her abdominal pain.  Okay to continue current management in the meantime.  The patient verbalized understanding and all questions were answered to the patient's satisfaction.  labs/images/medications/previous chart entries reviewed personally and relevant changes/updates noted above.

## 2023-07-30 NOTE — TOC Initial Note (Signed)
 Transition of Care [facility]) - Initial/Assessment Note    Patient Details  Name: Selena Pittman MRN: [id number] Date of Birth: September 22, 1950  Transition of Care [facility]) CM/SW Contact:    Chapman Fitch, RN Phone Number: 07/30/2023, 12:55 PM  Clinical Narrative:                  Per Kenney Houseman at [facility] patient is Long Term Care resident Fl2 sent for signature       Patient Goals and CMS Choice            Expected Discharge Plan and Services                                              Prior Living Arrangements/Services                       Activities of Daily Living      Permission Sought/Granted                  Emotional Assessment              Admission diagnosis:  Hypokalemia [E87.6] Lower urinary tract infectious disease [N39.0] Septic shock (HCC) [A41.9, R65.21] AMS (altered mental status) [R41.82] Patient Active Problem List   Diagnosis Date Noted   Acute metabolic encephalopathy 07/28/2023   Diarrhea 07/28/2023   Electrolyte abnormality 07/28/2023   History of breast cancer 07/28/2023   Prolonged QT interval 07/28/2023   Metabolic acidosis 07/28/2023   Malnutrition of moderate degree 07/28/2023   Septic shock (HCC) 07/27/2023   Acute distention of stomach 11/02/2022   Abdominal pain 11/01/2022   Multifocal pneumonia 10/29/2022   Bipolar disorder (HCC) 10/29/2022   Hyperlipidemia 10/29/2022   IBS (irritable colon syndrome) 10/29/2022   Generalized weakness 04/12/2022   Acute cystitis without hematuria 04/09/2022   Necrotizing pneumonia (HCC) 04/06/2022   Tobacco use 04/02/2022   GERD (gastroesophageal reflux disease) 04/02/2022   Schizophrenia (HCC) 04/02/2022   Leukocytosis 04/02/2022   Hypokalemia 04/02/2022   Lung mass 04/02/2022   Malignant neoplasm of left female breast (HCC) 06/05/2015   PCP:  Armando Gang, FNP Pharmacy:   [facility] - [place], [place] - [address] [place] [address] Phone: [phone] Fax: [phone]  [facility] [phone] - [place], [place] - [address] [place] [address] Phone: [phone] Fax: [phone]  [facility] - [place], [place] - [address] [place] [address] Phone: [phone] Fax: [phone]     Social Drivers of Health (SDOH) Social History: SDOH Screenings   Food Insecurity: No Food Insecurity (10/29/2022)  Housing: Low Risk  (10/29/2022)  Transportation Needs: No Transportation Needs (10/29/2022)  Utilities: Not At Risk (10/29/2022)  Tobacco Use: High Risk (10/29/2022)   SDOH Interventions:     Readmission Risk Interventions    07/30/2023   12:52 PM  Readmission Risk Prevention Plan  Transportation Screening Complete  HRI or Home Care Consult Complete  Medication Review (RN Care Manager) Complete

## 2023-07-30 NOTE — NC FL2 (Signed)
 [place] MEDICAID FL2 LEVEL OF CARE FORM     IDENTIFICATION  Patient Name: Selena Pittman Birthdate: May 13, 1950 Sex: female Admission Date (Current Location): 07/27/2023  [facility] and [place] Number:  Chiropodist and Address:         Provider Number: [id number]  Attending Physician Name and Address:  Alford Highland, MD  Relative Name and Phone Number:       Current Level of Care: Hospital Recommended Level of Care: Nursing Facility Prior Approval Number:    Date Approved/Denied:   PASRR Number: [id number]  Discharge Plan: SNF    Current Diagnoses: Patient Active Problem List   Diagnosis Date Noted   Acute metabolic encephalopathy 07/28/2023   Diarrhea 07/28/2023   Electrolyte abnormality 07/28/2023   History of breast cancer 07/28/2023   Prolonged QT interval 07/28/2023   Metabolic acidosis 07/28/2023   Malnutrition of moderate degree 07/28/2023   Septic shock (HCC) 07/27/2023   Acute distention of stomach 11/02/2022   Abdominal pain 11/01/2022   Multifocal pneumonia 10/29/2022   Bipolar disorder (HCC) 10/29/2022   Hyperlipidemia 10/29/2022   IBS (irritable colon syndrome) 10/29/2022   Generalized weakness 04/12/2022   Acute cystitis without hematuria 04/09/2022   Necrotizing pneumonia (HCC) 04/06/2022   Tobacco use 04/02/2022   GERD (gastroesophageal reflux disease) 04/02/2022   Schizophrenia (HCC) 04/02/2022   Leukocytosis 04/02/2022   Hypokalemia 04/02/2022   Lung mass 04/02/2022   Malignant neoplasm of left female breast (HCC) 06/05/2015    Orientation RESPIRATION BLADDER Height & Weight     Place, Self  Normal Incontinent Weight: 50.1 kg Height:  5\' 5"  (165.1 cm)  BEHAVIORAL SYMPTOMS/MOOD NEUROLOGICAL BOWEL NUTRITION STATUS      Incontinent Diet (dys 3)  AMBULATORY STATUS COMMUNICATION OF NEEDS Skin   Total Care Verbally Other (Comment) (rash)                       Personal Care Assistance Level of Assistance               Functional Limitations Info             SPECIAL CARE FACTORS FREQUENCY                       Contractures Contractures Info: Not present    Additional Factors Info  Code Status, Allergies, Isolation Precautions Code Status Info: full Allergies Info: Cat Dander, Lactose Intolerance (Gi), Mixed Ragweed, Peanut-containing Drug Products     Isolation Precautions Info: enteric     Current Medications (07/30/2023):  This is the current hospital active medication list Current Facility-Administered Medications  Medication Dose Route Frequency Provider Last Rate Last Admin   acetaminophen (TYLENOL) tablet 650 mg  650 mg Oral Q6H PRN Mikey College T, MD   650 mg at 07/29/23 1546   acidophilus (RISAQUAD) capsule 2 capsule  2 capsule Oral TID [place], [facility]   2 capsule at 07/30/23 1053   Chlorhexidine Gluconate Cloth 2 % PADS 6 each  6 each Topical Daily Erin Fulling, MD   6 each at 07/29/23 2158   cloZAPine (CLOZARIL) tablet 100 mg  100 mg Oral BID Mikey College T, MD   100 mg at 07/30/23 1053   dicyclomine (BENTYL) 10 MG/5ML solution 10 mg  10 mg Oral TID PRN Emeline General, MD       enoxaparin (LOVENOX) injection 40 mg  40 mg Subcutaneous Q24H Emeline General, MD   40 mg  at 07/29/23 1547   feeding supplement (ENSURE ENLIVE / ENSURE PLUS) liquid 237 mL  237 mL Oral BID BM Alford Highland, MD   237 mL at 07/30/23 0937   fluvoxaMINE (LUVOX) tablet 50 mg  50 mg Oral BID Mikey College T, MD   50 mg at 07/30/23 1053   letrozole [facility]) tablet 2.5 mg  2.5 mg Oral Daily Mikey College T, MD   2.5 mg at 07/30/23 1053   loratadine (CLARITIN) tablet 10 mg  10 mg Oral Daily Mikey College T, MD   10 mg at 07/30/23 1053   meropenem (MERREM) 1 g in sodium chloride 0.9 % 100 mL IVPB  1 g Intravenous Q8H Alford Highland, MD 200 mL/hr at 07/30/23 0924 1 g at 07/30/23 0924   midodrine (PROAMATINE) tablet 2.5 mg  2.5 mg Oral TID WC Alford Highland, MD   2.5 mg at 07/30/23 1104   montelukast  (SINGULAIR) tablet 5 mg  5 mg Oral QHS Alford Highland, MD   5 mg at 07/29/23 2156   multivitamin with minerals tablet 1 tablet  1 tablet Oral Daily Alford Highland, MD   1 tablet at 07/30/23 1053   nystatin (MYCOSTATIN/NYSTOP) topical powder   Topical TID Alford Highland, MD   Given at 07/30/23 1914   Oral care mouth rinse  15 mL Mouth Rinse 4 times per day Alford Highland, MD   15 mL at 07/30/23 1104   Oral care mouth rinse  15 mL Mouth Rinse PRN Wieting, Richard, MD       pantoprazole (PROTONIX) EC tablet 40 mg  40 mg Oral BID Alford Highland, MD   40 mg at 07/30/23 1053   polyethylene glycol (MIRALAX / GLYCOLAX) packet 17 g  17 g Oral BID Alford Highland, MD   17 g at 07/30/23 7829   promethazine (PHENERGAN) 6.25 mg/NS 50 mL IVPB  6.25 mg Intravenous Q6H PRN Alford Highland, MD 150 mL/hr at 07/30/23 1003 6.25 mg at 07/30/23 1003   thiamine (VITAMIN B1) tablet 100 mg  100 mg Oral Daily Alford Highland, MD   100 mg at 07/30/23 1053     Discharge Medications: Please see discharge summary for a list of discharge medications.  Relevant Imaging Results:  Relevant Lab Results:   Additional Information SS [id number]  Chapman Fitch, RN

## 2023-07-30 NOTE — Progress Notes (Signed)
 Pharmacy Antibiotic Note  Selena Pittman is a 73 y.o. female admitted on 07/27/2023 with sepsis and UTI.  Pharmacy has been consulted for Vanc, Cefepime dosing.  Plan: Continue cefepime 2 grams IV every 12 hours Follow renal function and cultures for adjustments   Height: 5\' 5"  (165.1 cm) Weight: 50.1 kg (110 lb 7.2 oz) IBW/kg (Calculated) : 57  Temp (24hrs), Avg:97.8 F (36.6 C), Min:96.7 F (35.9 C), Max:98.5 F (36.9 C)  Recent Labs  Lab 07/27/23 1240 07/27/23 1550 07/28/23 0105 07/28/23 0955 07/28/23 1345 07/28/23 2212 07/29/23 0424 07/30/23 0442  WBC 7.9  --  5.7  --   --   --   --  4.6  CREATININE 0.36*  --  <0.30*  --  0.33* <0.30* <0.30*  --   LATICACIDVEN  --  0.7 1.2 1.5  --   --   --   --     CrCl cannot be calculated (This lab value cannot be used to calculate CrCl because it is not a number: <0.30).    Allergies  Allergen Reactions   Cat Dander    Lactose Intolerance (Gi)     Diarrhea    Mixed Ragweed    Peanut-Containing Drug Products     Antimicrobials this admission: Ceftriaxone 4/8 x 1 Vancomycin 4/9 x 1 Cefepime 4/9 >>   Microbiology results:  4/8 BCx: ngtd  4/8 UCx: ESBL E. Coli (R = Amp, Ancef, ceftriaxone, Cipro, SMX/TMP)  4/9 MRSA PCR: not detected  Thank you for allowing pharmacy to be a part of this patient's care.  Barrie Folk, PharmD, BCPS 07/30/2023 7:48 AM

## 2023-07-30 NOTE — Plan of Care (Signed)
   Problem: Fluid Volume: Goal: Hemodynamic stability will improve Outcome: Progressing   Problem: Clinical Measurements: Goal: Diagnostic test results will improve Outcome: Progressing Goal: Signs and symptoms of infection will decrease Outcome: Progressing   Problem: Respiratory: Goal: Ability to maintain adequate ventilation will improve Outcome: Progressing

## 2023-07-30 NOTE — Progress Notes (Signed)
 07/30/23 Patient was receiving personal care and nurse hands were tied up, left IMM at nurse station with admin. Staff to give to patient's nurse.

## 2023-07-31 DIAGNOSIS — G9341 Metabolic encephalopathy: Secondary | ICD-10-CM | POA: Diagnosis not present

## 2023-07-31 DIAGNOSIS — R6521 Severe sepsis with septic shock: Secondary | ICD-10-CM | POA: Diagnosis not present

## 2023-07-31 DIAGNOSIS — E878 Other disorders of electrolyte and fluid balance, not elsewhere classified: Secondary | ICD-10-CM | POA: Diagnosis not present

## 2023-07-31 DIAGNOSIS — A419 Sepsis, unspecified organism: Secondary | ICD-10-CM | POA: Diagnosis not present

## 2023-07-31 DIAGNOSIS — K5900 Constipation, unspecified: Secondary | ICD-10-CM | POA: Insufficient documentation

## 2023-07-31 LAB — COMPREHENSIVE METABOLIC PANEL WITH GFR
ALT: 50 U/L — ABNORMAL HIGH (ref 0–44)
AST: 26 U/L (ref 15–41)
Albumin: 2.8 g/dL — ABNORMAL LOW (ref 3.5–5.0)
Alkaline Phosphatase: 148 U/L — ABNORMAL HIGH (ref 38–126)
Anion gap: 7 (ref 5–15)
BUN: <5 mg/dL — ABNORMAL LOW (ref 8–23)
CO2: 27 mmol/L (ref 22–32)
Calcium: 8 mg/dL — ABNORMAL LOW (ref 8.9–10.3)
Chloride: 107 mmol/L (ref 98–111)
Creatinine, Ser: 0.38 mg/dL — ABNORMAL LOW (ref 0.44–1.00)
GFR, Estimated: >60 mL/min (ref >60)
Glucose, Bld: 83 mg/dL (ref 70–99)
Potassium: 3.2 mmol/L — ABNORMAL LOW (ref 3.5–5.1)
Sodium: 141 mmol/L (ref 135–145)
Total Bilirubin: 0.9 mg/dL (ref 0.0–1.2)
Total Protein: 4.5 g/dL — ABNORMAL LOW (ref 6.5–8.1)

## 2023-07-31 LAB — GLUCOSE, CAPILLARY
Glucose-Capillary: 76 mg/dL (ref 70–99)
Glucose-Capillary: 126 mg/dL — ABNORMAL HIGH (ref 70–99)

## 2023-07-31 LAB — PHOSPHORUS: Phosphorus: 4 mg/dL (ref 2.5–4.6)

## 2023-07-31 LAB — MAGNESIUM: Magnesium: 2 mg/dL (ref 1.7–2.4)

## 2023-07-31 MED ORDER — POTASSIUM CHLORIDE 20 MEQ PO PACK
40.0000 meq | PACK | Freq: Once | ORAL | Status: AC
  Administered 2023-07-31: 40 meq via ORAL
  Filled 2023-07-31: qty 2

## 2023-07-31 NOTE — Assessment & Plan Note (Signed)
 Seen on imaging.  The patient's diarrhea is likely overflow from constipation.  MiraLAX as needed.

## 2023-07-31 NOTE — Progress Notes (Signed)
 Subjective:  CC: Selena Pittman is a 73 y.o. female  Hospital stay day 4,   abdominal pain  HPI: Patient sleepy, unable to answer questions at time of exam    Objective:   Temp:  [97.5 F (36.4 C)-98.8 F (37.1 C)] 97.8 F (36.6 C) (04/12 0813) Pulse Rate:  [73-91] 78 (04/12 0813) Resp:  [16-19] 19 (04/12 0813) BP: (91-128)/(68-91) 128/91 (04/12 0813) SpO2:  [96 %-100 %] 96 % (04/12 0813) Weight:  [48.7 kg] 48.7 kg (04/12 0429)     Height: 5\' 5"  (165.1 cm) Weight: 48.7 kg BMI (Calculated): 17.87   Intake/Output this shift:   Intake/Output Summary (Last 24 hours) at 07/31/2023 1410 Last data filed at 07/30/2023 1623 Gross per 24 hour  Intake 180.62 ml  Output --  Net 180.62 ml    Constitutional :  no distress  Respiratory:  clear to auscultation bilaterally  Cardiovascular:  regular rate and rhythm  Gastrointestinal: Soft, no guarding, slightly decreased distention compared to previous exam.  Still has complaints of tenderness to palpation now in the lower quadrant.  No specific right upper quadrant tenderness today .   Skin: Cool and moist.   Psychiatric: Normal affect, non-agitated, not confused       LABS:     Latest Ref Rng & Units 07/31/2023    5:22 AM 07/29/2023    4:24 AM 07/28/2023   10:12 PM  CMP  Glucose 70 - 99 mg/dL 83  85  83   BUN 8 - 23 mg/dL <5  <5  <5   Creatinine 0.44 - 1.00 mg/dL 1.61  <0.96  <0.45   Sodium 135 - 145 mmol/L 141  136  134   Potassium 3.5 - 5.1 mmol/L 3.2  3.4  3.5   Chloride 98 - 111 mmol/L 107  108  108   CO2 22 - 32 mmol/L 27  20  19    Calcium 8.9 - 10.3 mg/dL 8.0  7.3  7.0   Total Protein 6.5 - 8.1 g/dL 4.5  4.9    Total Bilirubin 0.0 - 1.2 mg/dL 0.9  0.8    Alkaline Phos 38 - 126 U/L 148  154    AST 15 - 41 U/L 26  68    ALT 0 - 44 U/L 50  76        Latest Ref Rng & Units 07/30/2023    4:42 AM 07/28/2023    1:05 AM 07/27/2023   12:40 PM  CBC  WBC 4.0 - 10.5 K/uL 4.6  5.7  7.9   Hemoglobin 12.0 - 15.0 g/dL 40.9  81.1   91.4   Hematocrit 36.0 - 46.0 % 29.1  29.2  35.6   Platelets 150 - 400 K/uL 203  165  230     RADS: N/a Assessment:   Abdominal pain.  Right upper quadrant remains with no pain this morning.  Inconsistent generalized abdominal pain likely from chronic constipation issues.  Decreasing LFTs reassuring for no worsening biliary issues, unsure if that is ever been an issue.    Surgery will continue to monitor for now.  Further care per primary.  Labs/images/medications/previous chart entries reviewed personally and relevant changes/updates noted above.

## 2023-07-31 NOTE — Progress Notes (Signed)
 Progress Note   Patient: Selena Pittman ZOX:[id number] DOB: 1951/02/11 DOA: 07/27/2023     4 DOS: the patient was seen and examined on 07/31/2023   Brief hospital course: 73 y.o. female with medical history significant of bipolar affective disorder, breast cancer, anxiety/depression, HLD, hypothyroidism, presented with altered mentations.   Patient was found lethargy and somewhat confused this morning and facility staff called EMS.  Patient reported she has been having diarrhea for the last 4 to 5 days, 2-3 times a day, associated with periumbilical pain but denied any tenesmus no fever or chills no nausea vomiting.  She has been having cycles of constipation and diarrhea lately and has been taking as needed stool softener laxative for constipation and Imodium for diarrhea.  Patient denies any dysuria urinary frequency no back pain.   ED Course: Afebrile, nontachycardic blood pressure 89/71 improved to 113/80 after once 500 mL IV bolus in the ED. blood work showed K2.3, creatinine 2.3 bicarb 22, BUN 10 creatinine 2.3 WBC 7.9 hemoglobin 13.7.  UA showed 3+ WBC.  Negative for nitrite     4/9.  Patient has lethargic this morning but was able to wake up little bit more with nursing staff and take medications.  CT scan of the head negative.  Right upper quadrant sonogram showed distended gallbladder with some sludge no wall thickening. 4/10.  Patient complains of nausea and some abdominal pain.  Started on nausea medication and increase Protonix to twice daily.  CT scan of the abdomen showed diffuse dilation but no wall thickening or definitive obstruction may be due to ileus or enteritis, suspicion of mild areas of colon wall thickening and mucosal enhancement question of mild colitis, marked rectal distention by impacted appearing feces, slightly distended gallbladder with suspicion of gallbladder wall thickening suggest correlation with ultrasound. 4/11.  Patient complained of right upper quadrant  pain.  Patient had bowel movements with enema and MiraLAX. 412.  Continue MiraLAX.  Patient having less abdominal pain today.  Assessment and Plan: * Septic shock (HCC) Hypotension requiring pressors initially.  Discontinue midodrine on 4/12.  Patient received fluid boluses.  Acute cystitis with hematuria.  With ESBL E. coli growing out of urine culture placed on meropenem on 4/11.  Patient had hypothermia and initial tachycardia.  Acute metabolic encephalopathy Patient more lethargic this morning but did wake up and answer a few questions.  Electrolyte abnormality Severe hypokalemia, hypophosphatemia hypocalcemia, hyponatremia.  Replace potassium today.  Phosphorus magnesium and sodium in the normal range.  Abdominal pain Today's pain is in the left lower quadrant.  Appreciate surgery reviewing her imaging of the gallbladder.  Continue to monitor.  Liver function test trending better.  Increased Protonix to twice daily.  Could be secondary to constipation.    Prolonged QT interval Likely with electrolyte abnormality  Bipolar disorder [facility]) Patient on Clozaril  History of breast cancer On letrozole treatment  Constipation Seen on imaging.  The patient's diarrhea is likely overflow from constipation.  Continue MiraLAX.  Malnutrition of moderate degree Continue supplements  Metabolic acidosis Secondary to sepsis on presentation        Subjective: Patient this morning was hard to wake up.  She was able to wake up and answer few questions and then went back to sleep.  Admitted with septic shock.  Physical Exam: Physical Exam HENT:     Head: Normocephalic.     Mouth/Throat:     Pharynx: No oropharyngeal exudate.  Eyes:     General: Lids are normal.  Conjunctiva/sclera: Conjunctivae normal.  Cardiovascular:     Rate and Rhythm: Normal rate and regular rhythm.     Heart sounds: Normal heart sounds, S1 normal and S2 normal.  Pulmonary:     Breath sounds: No decreased  breath sounds, wheezing, rhonchi or rales.  Abdominal:     Palpations: Abdomen is soft.     Tenderness: There is abdominal tenderness in the left lower quadrant.  Musculoskeletal:     Right lower leg: No swelling.     Left lower leg: No swelling.     Comments: Contractions right hand.  Skin:    General: Skin is warm.     Findings: No rash.  Neurological:     Mental Status: She is lethargic.     Data Reviewed: Potassium 3.2, creatinine 0.38, alkaline phosphatase 148, AST normal at 26, ALT 50, total bilirubin 0.9 Family Communication: left message for son  Disposition: Status is: Inpatient Remains inpatient appropriate because: Continue IV meropenem for ESBL infection  Planned Discharge Destination: long term care    Time spent: 28 minutes  Author: Verla Glaze, MD 07/31/2023 2:16 PM  For on call review [email or web address].

## 2023-07-31 NOTE — Plan of Care (Signed)
   Problem: Fluid Volume: Goal: Hemodynamic stability will improve Outcome: Progressing   Problem: Clinical Measurements: Goal: Diagnostic test results will improve Outcome: Progressing Goal: Signs and symptoms of infection will decrease Outcome: Progressing   Problem: Respiratory: Goal: Ability to maintain adequate ventilation will improve Outcome: Progressing

## 2023-08-01 DIAGNOSIS — K5900 Constipation, unspecified: Secondary | ICD-10-CM

## 2023-08-01 DIAGNOSIS — Z681 Body mass index (BMI) 19 or less, adult: Secondary | ICD-10-CM

## 2023-08-01 DIAGNOSIS — R636 Underweight: Secondary | ICD-10-CM

## 2023-08-01 DIAGNOSIS — G9341 Metabolic encephalopathy: Secondary | ICD-10-CM | POA: Diagnosis not present

## 2023-08-01 DIAGNOSIS — R1032 Left lower quadrant pain: Secondary | ICD-10-CM | POA: Diagnosis not present

## 2023-08-01 DIAGNOSIS — A419 Sepsis, unspecified organism: Secondary | ICD-10-CM | POA: Diagnosis not present

## 2023-08-01 DIAGNOSIS — E878 Other disorders of electrolyte and fluid balance, not elsewhere classified: Secondary | ICD-10-CM | POA: Diagnosis not present

## 2023-08-01 LAB — CBC WITH DIFFERENTIAL/PLATELET
Abs Immature Granulocytes: 0.09 10*3/uL — ABNORMAL HIGH (ref 0.00–0.07)
Basophils Absolute: 0 10*3/uL (ref 0.0–0.1)
Basophils Relative: 0 %
Eosinophils Absolute: 0.1 10*3/uL (ref 0.0–0.5)
Eosinophils Relative: 1 %
HCT: 32.4 % — ABNORMAL LOW (ref 36.0–46.0)
Hemoglobin: 11.1 g/dL — ABNORMAL LOW (ref 12.0–15.0)
Immature Granulocytes: 2 %
Lymphocytes Relative: 36 %
Lymphs Abs: 1.7 10*3/uL (ref 0.7–4.0)
MCH: 31.1 pg (ref 26.0–34.0)
MCHC: 34.3 g/dL (ref 30.0–36.0)
MCV: 90.8 fL (ref 80.0–100.0)
Monocytes Absolute: 0.7 10*3/uL (ref 0.1–1.0)
Monocytes Relative: 14 %
Neutro Abs: 2.2 10*3/uL (ref 1.7–7.7)
Neutrophils Relative %: 47 %
Platelets: 200 10*3/uL (ref 150–400)
RBC: 3.57 MIL/uL — ABNORMAL LOW (ref 3.87–5.11)
RDW: 17.9 % — ABNORMAL HIGH (ref 11.5–15.5)
WBC: 4.7 10*3/uL (ref 4.0–10.5)
nRBC: 0 % (ref 0.0–0.2)

## 2023-08-01 LAB — CULTURE, BLOOD (ROUTINE X 2): Culture: NO GROWTH

## 2023-08-01 LAB — GLUCOSE, CAPILLARY: Glucose-Capillary: 83 mg/dL (ref 70–99)

## 2023-08-01 MED ORDER — POLYETHYLENE GLYCOL 3350 17 G PO PACK: 17.0000 g | PACK | Freq: Every day | ORAL | Status: DC

## 2023-08-01 MED ORDER — MIDODRINE HCL 5 MG PO TABS
5.0000 mg | ORAL_TABLET | Freq: Three times a day (TID) | ORAL | Status: DC
  Administered 2023-08-01 – 2023-08-02 (×5): 5 mg via ORAL
  Filled 2023-08-01 (×5): qty 1

## 2023-08-01 MED ORDER — SODIUM CHLORIDE 0.9 % IV BOLUS
500.0000 mL | Freq: Once | INTRAVENOUS | Status: AC
  Administered 2023-08-01: 500 mL via INTRAVENOUS

## 2023-08-01 NOTE — Progress Notes (Signed)
 Subjective:  CC: Selena Pittman is a 73 y.o. female  Hospital stay day 5,   abdominal pain  HPI: Patient sleepy, unable to answer questions at time of exam again today.  Objective:   Temp:  [97.5 F (36.4 C)-98.4 F (36.9 C)] 98.4 F (36.9 C) (04/13 0954) Pulse Rate:  [78-93] 80 (04/13 0954) Resp:  [18-20] 18 (04/13 0954) BP: (79-97)/(44-70) 97/58 (04/13 0954) SpO2:  [96 %-100 %] 96 % (04/13 0954)     Height: 5\' 5"  (165.1 cm) Weight: 48.7 kg BMI (Calculated): 17.87   Intake/Output this shift:   Intake/Output Summary (Last 24 hours) at 08/01/2023 1022 Last data filed at 08/01/2023 1017 Gross per 24 hour  Intake 420 ml  Output 1000 ml  Net -580 ml    Constitutional :  no distress  Respiratory:  clear to auscultation bilaterally  Cardiovascular:  regular rate and rhythm  Gastrointestinal: Soft, no guarding, no tenderness to palpation on exam today.   Skin: Cool and moist.   Psychiatric: Normal affect, non-agitated, not confused       LABS:     Latest Ref Rng & Units 07/31/2023    5:22 AM 07/29/2023    4:24 AM 07/28/2023   10:12 PM  CMP  Glucose 70 - 99 mg/dL 83  85  83   BUN 8 - 23 mg/dL <5  <5  <5   Creatinine 0.44 - 1.00 mg/dL 1.61  <0.96  <0.45   Sodium 135 - 145 mmol/L 141  136  134   Potassium 3.5 - 5.1 mmol/L 3.2  3.4  3.5   Chloride 98 - 111 mmol/L 107  108  108   CO2 22 - 32 mmol/L 27  20  19    Calcium 8.9 - 10.3 mg/dL 8.0  7.3  7.0   Total Protein 6.5 - 8.1 g/dL 4.5  4.9    Total Bilirubin 0.0 - 1.2 mg/dL 0.9  0.8    Alkaline Phos 38 - 126 U/L 148  154    AST 15 - 41 U/L 26  68    ALT 0 - 44 U/L 50  76        Latest Ref Rng & Units 08/01/2023    5:20 AM 07/30/2023    4:42 AM 07/28/2023    1:05 AM  CBC  WBC 4.0 - 10.5 K/uL 4.7  4.6  5.7   Hemoglobin 12.0 - 15.0 g/dL 40.9  81.1  91.4   Hematocrit 36.0 - 46.0 % 32.4  29.1  29.2   Platelets 150 - 400 K/uL 200  203  165     RADS: N/a Assessment:   Abdominal pain.  Resolved.  Further care per  hospitalist team.  Surgery will be signing off.  Please call with any additional questions or concerns.  Labs/images/medications/previous chart entries reviewed personally and relevant changes/updates noted above.

## 2023-08-01 NOTE — Plan of Care (Signed)
 Received report. Awake, and in bed. Denies pain at this time. Able to move self in bed but can not move legs. R hand contracture noted, reports has arthritis. Assessment complete. Continue to monitor, turning q2hrs. Taking fluids well. Sleeping when checked, rouses easily.  Slept most of night. B Morrison informed of low BP, no new orders noted. Monitoring. Slept most of night.

## 2023-08-01 NOTE — Progress Notes (Signed)
 Progress Note   Patient: Selena Pittman ZOX:[id number] DOB: 01-Apr-1951 DOA: 07/27/2023     5 DOS: the patient was seen and examined on 08/01/2023   Brief hospital course: 73 y.o. female with medical history significant of bipolar affective disorder, breast cancer, anxiety/depression, HLD, hypothyroidism, presented with altered mentations.   Patient was found lethargy and somewhat confused this morning and facility staff called EMS.  Patient reported she has been having diarrhea for the last 4 to 5 days, 2-3 times a day, associated with periumbilical pain but denied any tenesmus no fever or chills no nausea vomiting.  She has been having cycles of constipation and diarrhea lately and has been taking as needed stool softener laxative for constipation and Imodium for diarrhea.  Patient denies any dysuria urinary frequency no back pain.   ED Course: Afebrile, nontachycardic blood pressure 89/71 improved to 113/80 after once 500 mL IV bolus in the ED. blood work showed K2.3, creatinine 2.3 bicarb 22, BUN 10 creatinine 2.3 WBC 7.9 hemoglobin 13.7.  UA showed 3+ WBC.  Negative for nitrite     4/9.  Patient has lethargic this morning but was able to wake up little bit more with nursing staff and take medications.  CT scan of the head negative.  Right upper quadrant sonogram showed distended gallbladder with some sludge no wall thickening. 4/10.  Patient complains of nausea and some abdominal pain.  Started on nausea medication and increase Protonix to twice daily.  CT scan of the abdomen showed diffuse dilation but no wall thickening or definitive obstruction may be due to ileus or enteritis, suspicion of mild areas of colon wall thickening and mucosal enhancement question of mild colitis, marked rectal distention by impacted appearing feces, slightly distended gallbladder with suspicion of gallbladder wall thickening suggest correlation with ultrasound. 4/11.  Patient complained of right upper quadrant  pain.  Patient had bowel movements with enema and MiraLAX. 4/12.  Continue MiraLAX.  Patient having less abdominal pain today.  Patient finally woke up and answer some questions after stimulation. 4/13.  Patient lethargic this morning again.  Complained of pain in her left abdomen  Assessment and Plan: * Septic shock (HCC) Hypotension requiring pressors initially.  Discontinue midodrine on 4/12.  Patient received fluid boluses.  Acute cystitis with hematuria.  With ESBL E. coli growing out of urine culture placed on meropenem on 4/11.  Patient had hypothermia and initial tachycardia.  With hypotension again on 4/13, I ordered a fluid bolus and midodrine to be restarted.  Acute metabolic encephalopathy Patient more lethargic this morning but did wake up and answer a few questions.  Electrolyte abnormality Severe hypokalemia, hypophosphatemia hypocalcemia, hyponatremia.  Replace potassium.  Phosphorus magnesium and sodium in the normal range.  Abdominal pain Today's pain is in the left lower quadrant.  Appreciate surgery reviewing her imaging of the gallbladder.  Continue to monitor.  Liver function test trending better.  Increased Protonix to twice daily.  Could be secondary to constipation.    Prolonged QT interval Likely with electrolyte abnormality  Bipolar disorder (HCC) Patient on Clozaril  History of breast cancer On letrozole treatment  Underweight (BMI < 18.5) BMI down to 17.87  Constipation Seen on imaging.  The patient's diarrhea is likely overflow from constipation.  Continue MiraLAX I will decrease the dose to daily dosing  Malnutrition of moderate degree Continue supplements  Metabolic acidosis Secondary to sepsis on presentation        Subjective: Patient lethargic again this morning when I saw  her.  As per nursing staff she did wake up and eat for them.  I ordered a fluid bolus and restarted midodrine secondary to hypotension.  Physical Exam: Vitals:    07/31/23 2130 08/01/23 0507 08/01/23 0510 08/01/23 0954  BP: 92/70 (!) 79/44 (!) 85/52 (!) 97/58  Pulse:  78 78 80  Resp:  20  18  Temp:  97.8 F (36.6 C)  98.4 F (36.9 C)  TempSrc:  Oral    SpO2:  100%  96%  Weight:      Height:       Physical Exam HENT:     Head: Normocephalic.     Mouth/Throat:     Pharynx: No oropharyngeal exudate.  Eyes:     General: Lids are normal.     Conjunctiva/sclera: Conjunctivae normal.  Cardiovascular:     Rate and Rhythm: Normal rate and regular rhythm.     Heart sounds: Normal heart sounds, S1 normal and S2 normal.  Pulmonary:     Breath sounds: No decreased breath sounds, wheezing, rhonchi or rales.  Abdominal:     Palpations: Abdomen is soft.     Tenderness: There is abdominal tenderness in the left lower quadrant.  Musculoskeletal:     Right lower leg: No swelling.     Left lower leg: No swelling.     Comments: Contractions right hand.  Skin:    General: Skin is warm.     Findings: No rash.  Neurological:     Mental Status: She is lethargic.     Data Reviewed: White blood cell count 4.7, hemoglobin 11.1, platelet count 200  Family Communication: Updated son on the phone  Disposition: Status is: Inpatient Remains inpatient appropriate because: Patient more lethargic the last 2 days that she was the prior 2 days.  Continue to monitor.  Continue IV meropenem.  Blood pressure little low today restart midodrine and give a fluid bolus.  Planned Discharge Destination: Long-term care    Time spent: 28 minutes Case discussed with nursing staff  Author: Verla Glaze, MD 08/01/2023 2:11 PM  For on call review [email or web address].

## 2023-08-01 NOTE — Assessment & Plan Note (Signed)
 BMI down to 17.87

## 2023-08-02 DIAGNOSIS — A419 Sepsis, unspecified organism: Secondary | ICD-10-CM | POA: Diagnosis not present

## 2023-08-02 DIAGNOSIS — Z1612 Extended spectrum beta lactamase (ESBL) resistance: Secondary | ICD-10-CM

## 2023-08-02 DIAGNOSIS — E878 Other disorders of electrolyte and fluid balance, not elsewhere classified: Secondary | ICD-10-CM | POA: Diagnosis not present

## 2023-08-02 DIAGNOSIS — A498 Other bacterial infections of unspecified site: Secondary | ICD-10-CM

## 2023-08-02 DIAGNOSIS — G9341 Metabolic encephalopathy: Secondary | ICD-10-CM | POA: Diagnosis not present

## 2023-08-02 DIAGNOSIS — K5909 Other constipation: Secondary | ICD-10-CM

## 2023-08-02 LAB — CULTURE, BLOOD (ROUTINE X 2): Culture: NO GROWTH

## 2023-08-02 LAB — COMPREHENSIVE METABOLIC PANEL WITH GFR
ALT: 37 U/L (ref 0–44)
AST: 23 U/L (ref 15–41)
Albumin: 2.7 g/dL — ABNORMAL LOW (ref 3.5–5.0)
Alkaline Phosphatase: 139 U/L — ABNORMAL HIGH (ref 38–126)
Anion gap: 8 (ref 5–15)
BUN: <5 mg/dL — ABNORMAL LOW (ref 8–23)
CO2: 26 mmol/L (ref 22–32)
Calcium: 9.2 mg/dL (ref 8.9–10.3)
Chloride: 106 mmol/L (ref 98–111)
Creatinine, Ser: 0.47 mg/dL (ref 0.44–1.00)
GFR, Estimated: >60 mL/min (ref >60)
Glucose, Bld: 91 mg/dL (ref 70–99)
Potassium: 3.7 mmol/L (ref 3.5–5.1)
Sodium: 140 mmol/L (ref 135–145)
Total Bilirubin: 0.4 mg/dL (ref 0.0–1.2)
Total Protein: 4.5 g/dL — ABNORMAL LOW (ref 6.5–8.1)

## 2023-08-02 MED ORDER — POLYETHYLENE GLYCOL 3350 17 G PO PACK: 17.0000 g | PACK | Freq: Every day | ORAL | Status: DC | PRN

## 2023-08-02 MED ORDER — MIDODRINE HCL 5 MG PO TABS
2.5000 mg | ORAL_TABLET | Freq: Three times a day (TID) | ORAL | Status: DC
  Administered 2023-08-02 – 2023-08-03 (×3): 2.5 mg via ORAL
  Filled 2023-08-02 (×3): qty 1

## 2023-08-02 MED ORDER — SODIUM CHLORIDE 0.9 % IV SOLN
1.0000 g | Freq: Two times a day (BID) | INTRAVENOUS | Status: DC
  Administered 2023-08-02 – 2023-08-03 (×2): 1 g via INTRAVENOUS
  Filled 2023-08-02 (×3): qty 20

## 2023-08-02 NOTE — TOC Progression Note (Signed)
 Transition of Care [facility]) - Progression Note    Patient Details  Name: Selena Pittman MRN: [id number] Date of Birth: [birth year]  [facility]) CM/SW Contact  [name], LCSW Phone Number: 08/02/2023, 1:39 PM  Clinical Narrative:  CSW left secure voicemail for [facility] admissions coordinator to let her know that anticipated discharge date is tomorrow.   Expected Discharge Plan and Services                                               Social Determinants of Health (SDOH) Interventions SDOH Screenings   Food Insecurity: No Food Insecurity (10/29/2022)  Housing: Low Risk  (10/29/2022)  Transportation Needs: No Transportation Needs (10/29/2022)  Utilities: Not At Risk (10/29/2022)  Tobacco Use: High Risk (10/29/2022)    Readmission Risk Interventions    07/30/2023   12:52 PM  Readmission Risk Prevention Plan  Transportation Screening Complete  HRI or Home Care Consult Complete  Medication Review (RN Care Manager) Complete

## 2023-08-02 NOTE — Plan of Care (Signed)
  Problem: Pain Managment: Goal: General experience of comfort will improve and/or be controlled Outcome: Progressing   Problem: Safety: Goal: Ability to remain free from injury will improve Outcome: Progressing   Problem: Skin Integrity: Goal: Risk for impaired skin integrity will decrease Outcome: Progressing

## 2023-08-02 NOTE — Progress Notes (Signed)
 Progress Note   Patient: Selena Pittman ZOX:[id number] DOB: 1950/12/21 DOA: 07/27/2023     6 DOS: the patient was seen and examined on 08/02/2023   Brief hospital course: 73 y.o. female with medical history significant of bipolar affective disorder, breast cancer, anxiety/depression, HLD, hypothyroidism, presented with altered mentations.   Patient was found lethargy and somewhat confused this morning and facility staff called EMS.  Patient reported she has been having diarrhea for the last 4 to 5 days, 2-3 times a day, associated with periumbilical pain but denied any tenesmus no fever or chills no nausea vomiting.  She has been having cycles of constipation and diarrhea lately and has been taking as needed stool softener laxative for constipation and Imodium for diarrhea.  Patient denies any dysuria urinary frequency no back pain.   ED Course: Afebrile, nontachycardic blood pressure 89/71 improved to 113/80 after once 500 mL IV bolus in the ED. blood work showed K2.3, creatinine 2.3 bicarb 22, BUN 10 creatinine 2.3 WBC 7.9 hemoglobin 13.7.  UA showed 3+ WBC.  Negative for nitrite     4/9.  Patient has lethargic this morning but was able to wake up little bit more with nursing staff and take medications.  CT scan of the head negative.  Right upper quadrant sonogram showed distended gallbladder with some sludge no wall thickening. 4/10.  Patient complains of nausea and some abdominal pain.  Started on nausea medication and increase Protonix to twice daily.  CT scan of the abdomen showed diffuse dilation but no wall thickening or definitive obstruction may be due to ileus or enteritis, suspicion of mild areas of colon wall thickening and mucosal enhancement question of mild colitis, marked rectal distention by impacted appearing feces, slightly distended gallbladder with suspicion of gallbladder wall thickening suggest correlation with ultrasound. 4/11.  Patient complained of right upper quadrant  pain.  Patient had bowel movements with enema and MiraLAX. 4/12.  Continue MiraLAX.  Patient having less abdominal pain today.  Patient finally woke up and answer some questions after stimulation. 4/13.  Patient lethargic this morning again.  Complained of pain in her left abdomen  Assessment and Plan: * Septic shock (HCC) Hypotension requiring pressors initially.  Discontinue midodrine on 4/12.  Patient received fluid boluses.  Acute cystitis with hematuria.  With ESBL E. coli growing out of urine culture placed on meropenem on 4/11.  Patient had hypothermia and initial tachycardia.  With hypotension again on 4/13, I ordered a fluid bolus and midodrine to be restarted.  Try to taper midodrine today  Acute metabolic encephalopathy Took a while to wake her up but when she woke up she was able to answer questions.  Electrolyte abnormality Severe hypokalemia, hypophosphatemia hypocalcemia, hyponatremia.  Electrolytes in normal range.  Abdominal pain Today's pain is in the left lower quadrant.  Appreciate surgery reviewing her imaging of the gallbladder.  Continue to monitor.  Liver function test trended better.  Increased Protonix to twice daily.  Could be secondary to constipation.    Prolonged QT interval Likely with electrolyte abnormality  Bipolar disorder (HCC) Patient on Clozaril  History of breast cancer On letrozole treatment  Underweight (BMI < 18.5) BMI down to 16.62  Constipation Seen on imaging.  The patient's diarrhea is likely overflow from constipation.  MiraLAX as needed  Malnutrition of moderate degree Continue supplements  Metabolic acidosis Secondary to sepsis on presentation        Subjective: Patient awake and from sleep again.  Took me a little while  to stimulate her before she started talking.  Will make anticonstipation medications as needed today.  Continue meropenem today and switch over to nitrofurantoin upon discharge.  Physical Exam: Vitals:    08/01/23 1728 08/01/23 2147 08/02/23 0505 08/02/23 0802  BP: (!) 89/61 103/75 95/69 120/76  Pulse: 74 75 79 78  Resp: 16 18 20 18   Temp: 98.2 F (36.8 C) 98.2 F (36.8 C) 98.3 F (36.8 C) 98.4 F (36.9 C)  TempSrc:  Oral    SpO2: 100% 100% 97% 99%  Weight:   45.3 kg   Height:       Physical Exam HENT:     Head: Normocephalic.     Mouth/Throat:     Pharynx: No oropharyngeal exudate.  Eyes:     General: Lids are normal.     Conjunctiva/sclera: Conjunctivae normal.  Cardiovascular:     Rate and Rhythm: Normal rate and regular rhythm.     Heart sounds: Normal heart sounds, S1 normal and S2 normal.  Pulmonary:     Breath sounds: No decreased breath sounds, wheezing, rhonchi or rales.  Abdominal:     Palpations: Abdomen is soft.     Tenderness: There is abdominal tenderness in the left lower quadrant.  Musculoskeletal:     Right lower leg: No swelling.     Left lower leg: No swelling.     Comments: Contractions right hand.  Skin:    General: Skin is warm.     Findings: No rash.  Neurological:     Mental Status: She is alert.     Comments: Took me a while to wake her up this morning but she did not answer questions when awake.     Data Reviewed: ESBL E. coli growing out of urine culture Creatinine 0.47, alkaline phosphatase 139, AST 23, ALT 37, total bilirubin 0.4  Family Communication: Updated son on the phone  Disposition: Status is: Inpatient Remains inpatient appropriate because: Continue IV meropenem today.  Switch over to nitrofurantoin tomorrow.  Likely back to facility tomorrow.  Hold on MiraLAX today.  Planned Discharge Destination: Long-term care    Time spent: 28 minutes  Author: Verla Glaze, MD 08/02/2023 2:38 PM  For on call review [email or web address].

## 2023-08-02 NOTE — Progress Notes (Signed)
 PHARMACY NOTE:  ANTIMICROBIAL RENAL DOSAGE ADJUSTMENT  Current antimicrobial regimen includes a mismatch between antimicrobial dosage and estimated renal function.  As per policy approved by the Pharmacy & Therapeutics and Medical Executive Committees, the antimicrobial dosage will be adjusted accordingly.  Current antimicrobial dosage:  Meropenem 1 gm IV q8h  Indication: ESBL E.coli UTI  Renal Function:  Estimated Creatinine Clearance: 46.1 mL/min (by C-G formula based on SCr of 0.47 mg/dL).     Antimicrobial dosage has been changed to:  Meropenem 1 gm IV q12h for Crcl <50 ml/min  Additional comments:   Thank you for allowing pharmacy to be a part of this patient's care.  Thomasine Flick PharmD Clinical Pharmacist 08/02/2023

## 2023-08-03 ENCOUNTER — Encounter: Payer: Self-pay | Admitting: Internal Medicine

## 2023-08-03 DIAGNOSIS — G9341 Metabolic encephalopathy: Secondary | ICD-10-CM | POA: Diagnosis not present

## 2023-08-03 DIAGNOSIS — E878 Other disorders of electrolyte and fluid balance, not elsewhere classified: Secondary | ICD-10-CM | POA: Diagnosis not present

## 2023-08-03 DIAGNOSIS — A419 Sepsis, unspecified organism: Secondary | ICD-10-CM | POA: Diagnosis not present

## 2023-08-03 DIAGNOSIS — A498 Other bacterial infections of unspecified site: Secondary | ICD-10-CM | POA: Diagnosis not present

## 2023-08-03 LAB — CBC WITH DIFFERENTIAL/PLATELET
Abs Immature Granulocytes: 0.06 10*3/uL (ref 0.00–0.07)
Basophils Absolute: 0 10*3/uL (ref 0.0–0.1)
Basophils Relative: 1 %
Eosinophils Absolute: 0.1 10*3/uL (ref 0.0–0.5)
Eosinophils Relative: 2 %
HCT: 31.6 % — ABNORMAL LOW (ref 36.0–46.0)
Hemoglobin: 10.6 g/dL — ABNORMAL LOW (ref 12.0–15.0)
Immature Granulocytes: 2 %
Lymphocytes Relative: 33 %
Lymphs Abs: 1.4 10*3/uL (ref 0.7–4.0)
MCH: 29.9 pg (ref 26.0–34.0)
MCHC: 33.5 g/dL (ref 30.0–36.0)
MCV: 89 fL (ref 80.0–100.0)
Monocytes Absolute: 0.5 10*3/uL (ref 0.1–1.0)
Monocytes Relative: 12 %
Neutro Abs: 2.1 10*3/uL (ref 1.7–7.7)
Neutrophils Relative %: 50 %
Platelets: 230 10*3/uL (ref 150–400)
RBC: 3.55 MIL/uL — ABNORMAL LOW (ref 3.87–5.11)
RDW: 16.9 % — ABNORMAL HIGH (ref 11.5–15.5)
WBC: 4 10*3/uL (ref 4.0–10.5)
nRBC: 0 % (ref 0.0–0.2)

## 2023-08-03 MED ORDER — RISAQUAD PO CAPS: 2.0000 | ORAL_CAPSULE | Freq: Three times a day (TID) | ORAL | 0 refills | Status: DC

## 2023-08-03 MED ORDER — NITROFURANTOIN MONOHYD MACRO 100 MG PO CAPS
100.0000 mg | ORAL_CAPSULE | Freq: Two times a day (BID) | ORAL | 0 refills | Status: AC
Start: 2023-08-03 — End: 2023-08-07

## 2023-08-03 MED ORDER — POLYETHYLENE GLYCOL 3350 17 G PO PACK: 17.0000 g | PACK | Freq: Every day | ORAL | 0 refills | Status: DC | PRN

## 2023-08-03 MED ORDER — CLOZAPINE 100 MG PO TABS: 100.0000 mg | ORAL_TABLET | Freq: Two times a day (BID) | ORAL | Status: DC

## 2023-08-03 MED ORDER — THIAMINE HCL 100 MG PO TABS: 100.0000 mg | ORAL_TABLET | Freq: Every day | ORAL | 0 refills | Status: DC

## 2023-08-03 MED ORDER — ADULT MULTIVITAMIN W/MINERALS CH: 1.0000 | ORAL_TABLET | Freq: Every day | ORAL | 0 refills | Status: DC

## 2023-08-03 MED ORDER — ENSURE ENLIVE PO LIQD: 237.0000 mL | Freq: Two times a day (BID) | ORAL | 0 refills | Status: DC

## 2023-08-03 MED ORDER — CLOTRIMAZOLE 1 % EX CREA: TOPICAL_CREAM | CUTANEOUS | 0 refills | Status: DC

## 2023-08-03 MED ORDER — NITROFURANTOIN MONOHYD MACRO 100 MG PO CAPS: 100.0000 mg | ORAL_CAPSULE | Freq: Two times a day (BID) | ORAL | 0 refills | Status: DC

## 2023-08-03 MED ORDER — MIDODRINE HCL 2.5 MG PO TABS: 2.5000 mg | ORAL_TABLET | Freq: Three times a day (TID) | ORAL | 0 refills | Status: DC

## 2023-08-03 NOTE — TOC Transition Note (Signed)
 Transition of Care [facility]) - Discharge Note   Patient Details  Name: Selena Pittman MRN: [id number] Date of Birth: [birth year]  Transition of Care [facility]) CM/SW Contact:  Loman Risk, RN Phone Number: 08/03/2023, 11:38 AM   Clinical Narrative:      Patient will DC to: [facility]  Anticipated DC date: 08/03/23  Family notified:VM left for son steven Transport by: Civil engineer, contracting  Per MD patient ready for DC to . RN, , patient's family, and facility notified of DC. Discharge Summary sent to facility. RN given number for report. DC packet on chart. Ambulance transport requested for patient.  TOC signing off.        Patient Goals and CMS Choice            Discharge Placement                       Discharge Plan and Services Additional resources added to the After Visit Summary for                                       Social Drivers of Health (SDOH) Interventions SDOH Screenings   Food Insecurity: No Food Insecurity (10/29/2022)  Housing: Low Risk  (10/29/2022)  Transportation Needs: No Transportation Needs (10/29/2022)  Utilities: Not At Risk (10/29/2022)  Tobacco Use: High Risk (08/03/2023)     Readmission Risk Interventions    07/30/2023   12:52 PM  Readmission Risk Prevention Plan  Transportation Screening Complete  HRI or Home Care Consult Complete  Medication Review (RN Care Manager) Complete

## 2023-08-03 NOTE — Discharge Summary (Signed)
 Physician Discharge Summary   Patient: Selena Pittman MRN: [id number] DOB: [birth year]  Admit date:     07/27/2023  Discharge date: 08/03/23  Discharge Physician: Verla Glaze   PCP: Sharyne Degree, FNP   Recommendations at discharge:    Follow up with Doctor at facility  Discharge Diagnoses: Principal Problem:   Septic shock [facility]) Active Problems:   Acute metabolic encephalopathy   Electrolyte abnormality   Abdominal pain   Bipolar disorder (HCC)   Prolonged QT interval   History of breast cancer   Metabolic acidosis   Malnutrition of moderate degree   Constipation   Underweight (BMI < 18.5)   Infection due to ESBL-producing Escherichia coli    Hospital Course: 73 y.o. female with medical history significant of bipolar affective disorder, breast cancer, anxiety/depression, HLD, hypothyroidism, presented with altered mentations.   Patient was found lethargy and somewhat confused this morning and facility staff called EMS.  Patient reported she has been having diarrhea for the last 4 to 5 days, 2-3 times a day, associated with periumbilical pain but denied any tenesmus no fever or chills no nausea vomiting.  She has been having cycles of constipation and diarrhea lately and has been taking as needed stool softener laxative for constipation and Imodium for diarrhea.  Patient denies any dysuria urinary frequency no back pain.   ED Course: Afebrile, nontachycardic blood pressure 89/71 improved to 113/80 after once 500 mL IV bolus in the ED. blood work showed K2.3, creatinine 2.3 bicarb 22, BUN 10 creatinine 2.3 WBC 7.9 hemoglobin 13.7.  UA showed 3+ WBC.  Negative for nitrite     4/9.  Patient has lethargic this morning but was able to wake up little bit more with nursing staff and take medications.  CT scan of the head negative.  Right upper quadrant sonogram showed distended gallbladder with some sludge no wall thickening. 4/10.  Patient complains of nausea and some  abdominal pain.  Started on nausea medication and increase Protonix to twice daily.  CT scan of the abdomen showed diffuse dilation but no wall thickening or definitive obstruction may be due to ileus or enteritis, suspicion of mild areas of colon wall thickening and mucosal enhancement question of mild colitis, marked rectal distention by impacted appearing feces, slightly distended gallbladder with suspicion of gallbladder wall thickening suggest correlation with ultrasound. 4/11.  Patient complained of right upper quadrant pain.  Patient had bowel movements with enema and MiraLAX. 4/12.  Continue MiraLAX.  Patient having less abdominal pain today.  Patient finally woke up and answer some questions after stimulation. 4/13.  Patient lethargic this morning again.  Complained of pain in her left abdomen 4/14.  Patient to discharge to facility. Switch antibiotics to nitrofurantoin for four more days.  Assessment and Plan: * Septic shock (HCC) Hypotension requiring pressors initially.  Discontinue midodrine on 4/12.  Patient received fluid boluses.  Acute cystitis with hematuria.  With ESBL E. coli growing out of urine culture placed on meropenem on 4/11.  Patient had hypothermia and initial tachycardia.  With hypotension again on 4/13, I ordered a fluid bolus and midodrine to be restarted. (Continue midodrine 2.5 mg po tid upon discharge (hopefully can stop in a few days). Antibiotics switched to Nitrofurantoin for four more days.  Acute metabolic encephalopathy Took a while to wake her up but when she woke up she was able to answer questions. As per son was at baseline mental status yesterday afternoon.  Electrolyte abnormality Severe hypokalemia, hypophosphatemia hypocalcemia, hyponatremia.  Electrolytes in normal range.  Abdominal pain NO pain today.  Appreciate surgery reviewing her imaging of the gallbladder and he does not think this was her issue..  Continue to monitor.  Liver function test  trended better.   Could be secondary to constipation.    Prolonged QT interval Likely with electrolyte abnormality  Bipolar disorder (HCC) Patient on Clozaril  History of breast cancer On letrozole treatment  Underweight (BMI < 18.5) BMI down to 16.62  Constipation Seen on imaging.  The patient's diarrhea is likely overflow from constipation.  MiraLAX as needed.    Malnutrition of moderate degree Continue supplements  Metabolic acidosis Secondary to sepsis on presentation         Consultants: critical care team Procedures performed: none Disposition: Long term care Diet recommendation:  Dysphagia 3 diet with thin liquids DISCHARGE MEDICATION: Allergies as of 08/03/2023       Reactions   Cat Dander    Lactose Intolerance (gi)    Diarrhea    Mixed Ragweed    Peanut-containing Drug Products         Medication List     STOP taking these medications    dicyclomine 10 MG/5ML solution Commonly known as: BENTYL   hydrOXYzine 25 MG tablet Commonly known as: ATARAX   simvastatin 20 MG tablet Commonly known as: ZOCOR       TAKE these medications    acetaminophen 325 MG tablet Commonly known as: TYLENOL Take 2 tablets (650 mg total) by mouth every 6 (six) hours as needed for mild pain, fever or headache.   acidophilus Caps capsule Take 2 capsules by mouth 3 (three) times daily.   alendronate 70 MG tablet Commonly known as: FOSAMAX Take 1 tablet (70 mg total) by mouth once a week. Take with a full glass of water on an empty stomach.   cetirizine 10 MG tablet Commonly known as: ZYRTEC Take 1 tablet (10 mg total) by mouth daily.   cholecalciferol 1000 units tablet Commonly known as: VITAMIN D Take 2 tablets (2,000 Units total) by mouth daily.   clotrimazole 1 % cream Commonly known as: LOTRIMIN Apply bid to areas of redness   cloZAPine 100 MG tablet Commonly known as: CLOZARIL Take 1 tablet (100 mg total) by mouth 2 (two) times daily.    coal tar-salicylic acid 2 % shampoo Use to wash scalp Tuesday, Thursday and Saturdays.   docusate sodium 100 MG capsule Commonly known as: COLACE Take 1 capsule (100 mg total) by mouth 2 (two) times daily.   feeding supplement Liqd Take 237 mLs by mouth 2 (two) times daily between meals.   fluvoxaMINE 50 MG tablet Commonly known as: LUVOX Take 1 tablet (50 mg total) by mouth 2 (two) times daily.   glycerin adult 2 g suppository Place 1 suppository rectally as needed for constipation.   ketoconazole 2 % shampoo Commonly known as: NIZORAL APPLY TO AFFECTED AREA TWICE A WEEK FOR 8 WEEKS; THEN USE AS NEEDED.   letrozole 2.5 MG tablet Commonly known as: FEMARA Take 1 tablet (2.5 mg total) by mouth daily.   Linzess 145 MCG Caps capsule Generic drug: linaclotide Take 1 capsule (145 mcg total) by mouth daily.   liver oil-zinc oxide 40 % ointment Commonly known as: DESITIN Apply topically as needed for irritation.   melatonin 3 MG Tabs tablet Take 6 mg by mouth at bedtime.   midodrine 2.5 MG tablet Commonly known as: PROAMATINE Take 1 tablet (2.5 mg total) by mouth 3 (three)  times daily with meals.   montelukast 5 MG chewable tablet Commonly known as: SINGULAIR Chew 1 tablet (5 mg total) by mouth at bedtime.   multivitamin with minerals Tabs tablet Take 1 tablet by mouth daily.   nitrofurantoin (macrocrystal-monohydrate) 100 MG capsule Commonly known as: Macrobid Take 1 capsule (100 mg total) by mouth 2 (two) times daily for 4 days.   omeprazole 20 MG capsule Commonly known as: PRILOSEC Take 1 capsule (20 mg total) by mouth daily.   ondansetron 4 MG tablet Commonly known as: ZOFRAN Take 4 mg by mouth every 6 (six) hours as needed for nausea or vomiting.   polyethylene glycol 17 g packet Commonly known as: MIRALAX / GLYCOLAX Take 17 g by mouth daily as needed for moderate constipation. What changed:  when to take this reasons to take this   thiamine 100 MG  tablet Commonly known as: VITAMIN B1 Take 1 tablet (100 mg total) by mouth daily.   traZODone 50 MG tablet Commonly known as: DESYREL Take 25 mg by mouth at bedtime as needed.        Contact information for after-discharge care     Destination     [facility] .   Service: Skilled Nursing Contact information: [address] [place]  [phone]                    Discharge Exam: Cleavon Curls Weights   07/30/23 0447 07/31/23 0429 08/02/23 0505  Weight: 50.1 kg 48.7 kg 45.3 kg   Physical Exam HENT:     Head: Normocephalic.     Mouth/Throat:     Pharynx: No oropharyngeal exudate.  Eyes:     General: Lids are normal.     Conjunctiva/sclera: Conjunctivae normal.  Cardiovascular:     Rate and Rhythm: Normal rate and regular rhythm.     Heart sounds: Normal heart sounds, S1 normal and S2 normal.  Pulmonary:     Breath sounds: No decreased breath sounds, wheezing, rhonchi or rales.  Abdominal:     Palpations: Abdomen is soft.     Tenderness: There is abdominal tenderness in the left lower quadrant.  Musculoskeletal:     Right lower leg: No swelling.     Left lower leg: No swelling.     Comments: Contractions right hand.  Skin:    General: Skin is warm.     Findings: No rash.  Neurological:     Mental Status: She is alert.     Comments: Answers questions      Condition at discharge: fair  The results of significant diagnostics from this hospitalization (including imaging, microbiology, ancillary and laboratory) are listed below for reference.   Imaging Studies: CT ABDOMEN PELVIS W CONTRAST Result Date: 07/30/2023 CLINICAL DATA:  Left lower quadrant pain EXAM: CT ABDOMEN AND PELVIS WITH CONTRAST TECHNIQUE: Multidetector CT imaging of the abdomen and pelvis was performed using the standard protocol following bolus administration of intravenous contrast. RADIATION DOSE REDUCTION: This exam was performed according to the  departmental dose-optimization program which includes automated exposure control, adjustment of the mA and/or kV according to patient size and/or use of iterative reconstruction technique. CONTRAST:  OMNIPAQUE IOHEXOL 300 MG/ML  SOLN COMPARISON:  Radiograph 07/29/2023, CT 10/01/2022 FINDINGS: Lower chest: Lung bases demonstrate small right greater than left pleural effusions. Partial lower lobe consolidations probably due to passive atelectasis. Aortic atherosclerosis. Hepatobiliary: Slightly distended gallbladder. No calcified stone. Suspicion of gallbladder wall thickening or pericholecystic fluid. Prominent  common bile duct measuring up to 9 mm with mild smooth ductal enhancement, coronal series 5, image 37. Pancreas: Unremarkable. No pancreatic ductal dilatation or surrounding inflammatory changes. Spleen: Normal in size without focal abnormality. Adrenals/Urinary Tract: Adrenal glands are normal. Kidneys show prominent bilateral extrarenal pelvises. The bladder contains small focus of gas Stomach/Bowel: The stomach is nonenlarged. Contrast filled small bowel with diffuse dilatation but no wall thickening or definitive obstruction. Suspicion of mild areas of colon wall thickening and mucosal enhancement, for example hepatic flexure on series 2, image 41, ascending colon on coronal series 5, image 29. Moderate rectal distension by impacted appearing feces. Vascular/Lymphatic: Aortic atherosclerosis. No enlarged abdominal or pelvic lymph nodes. Reproductive: Hysterectomy.  No adnexal mass Other: No free air. Small volume free fluid within the abdomen and pelvis. Subcutaneous gas within the right anterior abdominal wall. Musculoskeletal: Scoliosis and degenerative changes. 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 Multilevel degenerative changes. IMPRESSION: 1. Contrast filled small bowel with diffuse dilatation but no wall thickening or definitive obstruction. Findings may be due to ileus or enteritis. 2.  Suspicion of mild areas of colon wall thickening and mucosal enhancement, question mild colitis. Moderate to marked rectal distension by impacted appearing feces. 3. Slightly distended gallbladder with suspicion of gallbladder wall thickening or pericholecystic fluid. Suggest correlation with ultrasound. Prominent common bile duct measuring up to 9 mm with mild smooth ductal enhancement, correlate with LFTs. 4. Small volume free fluid within the abdomen and pelvis. 5. Small right greater than left pleural effusions with partial lower lobe consolidations probably due to passive atelectasis. 6. Small focus of gas within the bladder, question recent instrumentation. 7. Aortic atherosclerosis. Electronically Signed   By: Jasmine Pang M.D.   On: 07/30/2023 00:54   DG Abd 1 View Result Date: 07/29/2023 CLINICAL DATA:  Abdominal pain. EXAM: ABDOMEN - 1 VIEW COMPARISON:  November 01, 2022. FINDINGS: Mild gastric distention is noted. No significant large or small bowel dilatation is noted. Mild amount of stool seen in distal sigmoid colon. IMPRESSION: Mild gastric distention. No other significant bowel dilatation is noted. Electronically Signed   By: Lupita Raider M.D.   On: 07/29/2023 16:45   CT HEAD WO CONTRAST ( ) Result Date: 07/28/2023 CLINICAL DATA:  Mental status change of unknown cause EXAM: CT HEAD WITHOUT CONTRAST TECHNIQUE: Contiguous axial images were obtained from the base of the skull through the vertex without intravenous contrast. RADIATION DOSE REDUCTION: This exam was performed according to the departmental dose-optimization program which includes automated exposure control, adjustment of the mA and/or kV according to patient size and/or use of iterative reconstruction technique. COMPARISON:  10/29/2022 FINDINGS: Brain: No change or acute finding. Extensive chronic small-vessel ischemic changes of the cerebral hemispheric white matter. No evidence of large vessel territory stroke, mass, hemorrhage,  hydrocephalus or extra-axial collection. Vascular: There is atherosclerotic calcification of the major vessels at the base of the brain. Skull: Negative Sinuses/Orbits: Clear/normal Other: None IMPRESSION: No acute CT finding. Extensive chronic small-vessel ischemic changes of the cerebral hemispheric white matter. Electronically Signed   By: Paulina Fusi M.D.   On: 07/28/2023 15:09   US Abdomen Limited RUQ (LIVER/GB) Result Date: 07/28/2023 CLINICAL DATA:  Transaminitis EXAM: ULTRASOUND ABDOMEN LIMITED RIGHT UPPER QUADRANT COMPARISON:  CT 10/01/2022. FINDINGS: Gallbladder: Dilated gallbladder. No wall thickening or adjacent fluid. There is some layering sludge. No shadowing stones. Common bile duct: Diameter: 5 mm Liver: No focal lesion identified. Within normal limits in parenchymal echogenicity. Portal vein is patent on color  Doppler imaging with normal direction of blood flow towards the liver. Other: None. IMPRESSION: Distended gallbladder with some sludge. No wall thickening. No shadowing stones. No ductal dilatation. Electronically Signed   By: Karen Kays M.D.   On: 07/28/2023 10:45   DG Chest Port 1 View Result Date: 07/27/2023 CLINICAL DATA:  Weakness. EXAM: PORTABLE CHEST 1 VIEW COMPARISON:  October 29, 2022. FINDINGS: The heart size and mediastinal contours are within normal limits. Stable right midlung opacity is noted concerning for scarring or subsegmental atelectasis. Left lung is clear. Old proximal left humeral fracture is noted. IMPRESSION: Stable right midlung subsegmental atelectasis or scarring. Electronically Signed   By: Lupita Raider M.D.   On: 07/27/2023 14:56    Microbiology: Results for orders placed or performed during the hospital encounter of 07/27/23  Urine Culture     Status: Abnormal   Collection Time: 07/27/23  3:25 PM   Specimen: In/Out Cath Urine  Result Value Ref Range Status   Specimen Description   Final    IN/OUT CATH URINE Performed at [facility],  [address] [place], [place] [address]    Special Requests   Final    NONE Performed at [facility], [address] [place], [place] [address]    Culture (A)  Final    >=100,000 COLONIES/mL ESCHERICHIA COLI Confirmed Extended Spectrum Beta-Lactamase Producer (ESBL).  In bloodstream infections from ESBL organisms, carbapenems are preferred over piperacillin/tazobactam. They are shown to have a lower risk of mortality.    Report Status 07/29/2023 FINAL  Final   Organism ID, Bacteria ESCHERICHIA COLI (A)  Final      Susceptibility   Escherichia coli - MIC*    AMPICILLIN >=32 RESISTANT Resistant     CEFAZOLIN >=64 RESISTANT Resistant     CEFEPIME 2 SENSITIVE Sensitive     CEFTRIAXONE >=64 RESISTANT Resistant     CIPROFLOXACIN >=4 RESISTANT Resistant     GENTAMICIN <=1 SENSITIVE Sensitive     IMIPENEM <=0.25 SENSITIVE Sensitive     NITROFURANTOIN <=16 SENSITIVE Sensitive     TRIMETH/SULFA >=320 RESISTANT Resistant     AMPICILLIN/SULBACTAM 4 SENSITIVE Sensitive     PIP/TAZO <=4 SENSITIVE Sensitive ug/mL    * >=100,000 COLONIES/mL ESCHERICHIA COLI  Blood culture (routine x 2)     Status: None   Collection Time: 07/27/23  4:10 PM   Specimen: BLOOD RIGHT HAND  Result Value Ref Range Status   Specimen Description BLOOD RIGHT HAND  Final   Special Requests   Final    BOTTLES DRAWN AEROBIC AND ANAEROBIC Blood Culture results may not be optimal due to an inadequate volume of blood received in culture bottles   Culture   Final    NO GROWTH 5 DAYS Performed at [facility], [address], [place], [place] [address]    Report Status 08/01/2023 FINAL  Final  Blood culture (routine x 2)     Status: None   Collection Time: 07/28/23  1:05 AM   Specimen: BLOOD  Result Value Ref Range Status   Specimen Description BLOOD BLOOD LEFT ARM  Final   Special Requests NONE  Final   Culture   Final    NO GROWTH 5 DAYS Performed at [facility], [address] [place], [place] [address]    Report Status 08/02/2023 FINAL  Final  MRSA Next Gen by PCR, Nasal     Status: None   Collection Time: 07/28/23  1:47 AM  Specimen: Nasal Mucosa; Nasal Swab  Result Value Ref Range Status   MRSA by PCR Next Gen NOT DETECTED NOT DETECTED Final    Comment: (NOTE) The GeneXpert MRSA Assay (FDA approved for NASAL specimens only), is one component of a comprehensive MRSA colonization surveillance program. It is not intended to diagnose MRSA infection nor to guide or monitor treatment for MRSA infections. Test performance is not FDA approved in patients less than 72 years old. Performed at [facility], [address] [place], [place] [address]   Gastrointestinal Panel by PCR , Stool     Status: None   Collection Time: 07/30/23 10:30 AM   Specimen: Stool  Result Value Ref Range Status   Campylobacter species NOT DETECTED NOT DETECTED Final   Plesimonas shigelloides NOT DETECTED NOT DETECTED Final   Salmonella species NOT DETECTED NOT DETECTED Final   Yersinia enterocolitica NOT DETECTED NOT DETECTED Final   Vibrio species NOT DETECTED NOT DETECTED Final   Vibrio cholerae NOT DETECTED NOT DETECTED Final   Enteroaggregative E coli (EAEC) NOT DETECTED NOT DETECTED Final   Enteropathogenic E coli (EPEC) NOT DETECTED NOT DETECTED Final   Enterotoxigenic E coli (ETEC) NOT DETECTED NOT DETECTED Final   Shiga like toxin producing E coli (STEC) NOT DETECTED NOT DETECTED Final   Shigella/Enteroinvasive E coli (EIEC) NOT DETECTED NOT DETECTED Final   Cryptosporidium NOT DETECTED NOT DETECTED Final   Cyclospora cayetanensis NOT DETECTED NOT DETECTED Final   Entamoeba histolytica NOT DETECTED NOT DETECTED Final   Giardia lamblia NOT DETECTED NOT DETECTED Final   Adenovirus F40/41 NOT DETECTED NOT DETECTED Final   Astrovirus NOT DETECTED NOT DETECTED Final   Norovirus GI/GII NOT DETECTED NOT DETECTED Final   Rotavirus A NOT DETECTED NOT DETECTED Final    Sapovirus (I, II, IV, and V) NOT DETECTED NOT DETECTED Final    Comment: Performed at [facility], [address] [place], [place] [address]    Labs: CBC: Recent Labs  Lab 07/27/23 1240 07/28/23 0105 07/30/23 0442 08/01/23 0520 08/03/23 0435  WBC 7.9 5.7 4.6 4.7 4.0  NEUTROABS  --  4.7 3.1 2.2 2.1  HGB 13.2 10.3* 10.4* 11.1* 10.6*  HCT 35.6* 29.2* 29.1* 32.4* 31.6*  MCV 83.4 86.4 86.1 90.8 89.0  PLT 230 165 203 200 230   Basic Metabolic Panel: Recent Labs  Lab 07/28/23 0104 07/28/23 0105 07/28/23 1345 07/28/23 2212 07/29/23 0424 07/30/23 0442 07/31/23 0522 08/02/23 0438  NA  --    < > 135 134* 136  --  141 140  K  --    < > 6.0* 3.5 3.4*  --  3.2* 3.7  CL  --    < > 110 108 108  --  107 106  CO2  --    < > 19* 19* 20*  --  27 26  GLUCOSE  --    < > 93 83 85  --  83 91  BUN  --    < > 5* <5* <5*  --  <5* <5*  CREATININE  --    < > 0.33* <0.30* <0.30*  --  0.38* 0.47  CALCIUM  --    < > 7.3* 7.0* 7.3*  --  8.0* 9.2  MG 1.2*  --  2.7*  --  2.4 2.2 2.0  --   PHOS <1.0*  --  1.9*  --  2.4* 2.0* 4.0  --    < > = values in this interval not displayed.  Liver Function Tests: Recent Labs  Lab 07/28/23 0105 07/29/23 0424 07/31/23 0522 08/02/23 0438  AST 67* 68* 26 23  ALT 87* 76* 50* 37  ALKPHOS 186* 154* 148* 139*  BILITOT 0.7 0.8 0.9 0.4  PROT 3.8* 4.9* 4.5* 4.5*  ALBUMIN 1.8* 3.4* 2.8* 2.7*   CBG: Recent Labs  Lab 07/28/23 0150 07/28/23 1606 07/31/23 0956 07/31/23 1052 08/01/23 0955  GLUCAP 107* 202* 76 126* 83    Discharge time spent: greater than 30 minutes.  Signed: Verla Glaze, MD [facility] 08/03/2023

## 2023-09-28 ENCOUNTER — Encounter: Payer: Self-pay | Admitting: Internal Medicine

## 2023-09-28 ENCOUNTER — Inpatient Hospital Stay
Admission: EM | Admit: 2023-09-28 | Discharge: 2023-10-19 | DRG: 870 | Disposition: E | Source: Skilled Nursing Facility | Attending: Student in an Organized Health Care Education/Training Program | Admitting: Student in an Organized Health Care Education/Training Program

## 2023-09-28 ENCOUNTER — Other Ambulatory Visit: Payer: Self-pay

## 2023-09-28 ENCOUNTER — Inpatient Hospital Stay

## 2023-09-28 ENCOUNTER — Emergency Department

## 2023-09-28 DIAGNOSIS — J439 Emphysema, unspecified: Secondary | ICD-10-CM | POA: Diagnosis present

## 2023-09-28 DIAGNOSIS — E43 Unspecified severe protein-calorie malnutrition: Secondary | ICD-10-CM | POA: Diagnosis not present

## 2023-09-28 DIAGNOSIS — G4700 Insomnia, unspecified: Secondary | ICD-10-CM | POA: Diagnosis present

## 2023-09-28 DIAGNOSIS — R531 Weakness: Principal | ICD-10-CM

## 2023-09-28 DIAGNOSIS — Z681 Body mass index (BMI) 19 or less, adult: Secondary | ICD-10-CM

## 2023-09-28 DIAGNOSIS — I2489 Other forms of acute ischemic heart disease: Secondary | ICD-10-CM | POA: Diagnosis present

## 2023-09-28 DIAGNOSIS — I82413 Acute embolism and thrombosis of femoral vein, bilateral: Secondary | ICD-10-CM | POA: Diagnosis present

## 2023-09-28 DIAGNOSIS — A419 Sepsis, unspecified organism: Secondary | ICD-10-CM | POA: Diagnosis not present

## 2023-09-28 DIAGNOSIS — G928 Other toxic encephalopathy: Secondary | ICD-10-CM | POA: Diagnosis not present

## 2023-09-28 DIAGNOSIS — R6520 Severe sepsis without septic shock: Secondary | ICD-10-CM | POA: Diagnosis not present

## 2023-09-28 DIAGNOSIS — J9601 Acute respiratory failure with hypoxia: Secondary | ICD-10-CM | POA: Diagnosis not present

## 2023-09-28 DIAGNOSIS — E785 Hyperlipidemia, unspecified: Secondary | ICD-10-CM | POA: Diagnosis present

## 2023-09-28 DIAGNOSIS — J1289 Other viral pneumonia: Secondary | ICD-10-CM | POA: Diagnosis present

## 2023-09-28 DIAGNOSIS — Z515 Encounter for palliative care: Secondary | ICD-10-CM | POA: Diagnosis not present

## 2023-09-28 DIAGNOSIS — Z8249 Family history of ischemic heart disease and other diseases of the circulatory system: Secondary | ICD-10-CM

## 2023-09-28 DIAGNOSIS — R54 Age-related physical debility: Secondary | ICD-10-CM | POA: Diagnosis present

## 2023-09-28 DIAGNOSIS — I10 Essential (primary) hypertension: Secondary | ICD-10-CM | POA: Diagnosis not present

## 2023-09-28 DIAGNOSIS — Z720 Tobacco use: Secondary | ICD-10-CM | POA: Diagnosis present

## 2023-09-28 DIAGNOSIS — N390 Urinary tract infection, site not specified: Secondary | ICD-10-CM | POA: Diagnosis not present

## 2023-09-28 DIAGNOSIS — Z9151 Personal history of suicidal behavior: Secondary | ICD-10-CM

## 2023-09-28 DIAGNOSIS — F209 Schizophrenia, unspecified: Secondary | ICD-10-CM | POA: Diagnosis present

## 2023-09-28 DIAGNOSIS — J122 Parainfluenza virus pneumonia: Secondary | ICD-10-CM | POA: Diagnosis not present

## 2023-09-28 DIAGNOSIS — I2699 Other pulmonary embolism without acute cor pulmonale: Secondary | ICD-10-CM | POA: Diagnosis not present

## 2023-09-28 DIAGNOSIS — L89153 Pressure ulcer of sacral region, stage 3: Secondary | ICD-10-CM | POA: Diagnosis present

## 2023-09-28 DIAGNOSIS — Z7401 Bed confinement status: Secondary | ICD-10-CM

## 2023-09-28 DIAGNOSIS — Z79899 Other long term (current) drug therapy: Secondary | ICD-10-CM

## 2023-09-28 DIAGNOSIS — F259 Schizoaffective disorder, unspecified: Secondary | ICD-10-CM | POA: Diagnosis present

## 2023-09-28 DIAGNOSIS — Z1624 Resistance to multiple antibiotics: Secondary | ICD-10-CM | POA: Diagnosis present

## 2023-09-28 DIAGNOSIS — E039 Hypothyroidism, unspecified: Secondary | ICD-10-CM | POA: Diagnosis present

## 2023-09-28 DIAGNOSIS — R449 Unspecified symptoms and signs involving general sensations and perceptions: Secondary | ICD-10-CM

## 2023-09-28 DIAGNOSIS — E871 Hypo-osmolality and hyponatremia: Secondary | ICD-10-CM | POA: Diagnosis not present

## 2023-09-28 DIAGNOSIS — F319 Bipolar disorder, unspecified: Secondary | ICD-10-CM | POA: Diagnosis not present

## 2023-09-28 DIAGNOSIS — J15211 Pneumonia due to Methicillin susceptible Staphylococcus aureus: Secondary | ICD-10-CM | POA: Diagnosis not present

## 2023-09-28 DIAGNOSIS — Z853 Personal history of malignant neoplasm of breast: Secondary | ICD-10-CM

## 2023-09-28 DIAGNOSIS — Z66 Do not resuscitate: Secondary | ICD-10-CM | POA: Diagnosis not present

## 2023-09-28 DIAGNOSIS — Z79811 Long term (current) use of aromatase inhibitors: Secondary | ICD-10-CM

## 2023-09-28 DIAGNOSIS — K219 Gastro-esophageal reflux disease without esophagitis: Secondary | ICD-10-CM | POA: Diagnosis present

## 2023-09-28 DIAGNOSIS — R918 Other nonspecific abnormal finding of lung field: Secondary | ICD-10-CM

## 2023-09-28 DIAGNOSIS — A4101 Sepsis due to Methicillin susceptible Staphylococcus aureus: Principal | ICD-10-CM | POA: Diagnosis present

## 2023-09-28 DIAGNOSIS — J690 Pneumonitis due to inhalation of food and vomit: Secondary | ICD-10-CM | POA: Diagnosis not present

## 2023-09-28 DIAGNOSIS — F17210 Nicotine dependence, cigarettes, uncomplicated: Secondary | ICD-10-CM | POA: Diagnosis present

## 2023-09-28 DIAGNOSIS — L8990 Pressure ulcer of unspecified site, unspecified stage: Secondary | ICD-10-CM | POA: Insufficient documentation

## 2023-09-28 DIAGNOSIS — R8281 Pyuria: Secondary | ICD-10-CM

## 2023-09-28 DIAGNOSIS — E876 Hypokalemia: Secondary | ICD-10-CM | POA: Diagnosis present

## 2023-09-28 DIAGNOSIS — R739 Hyperglycemia, unspecified: Secondary | ICD-10-CM | POA: Diagnosis not present

## 2023-09-28 DIAGNOSIS — Z9012 Acquired absence of left breast and nipple: Secondary | ICD-10-CM

## 2023-09-28 DIAGNOSIS — R64 Cachexia: Secondary | ICD-10-CM | POA: Diagnosis present

## 2023-09-28 DIAGNOSIS — R6521 Severe sepsis with septic shock: Secondary | ICD-10-CM | POA: Diagnosis not present

## 2023-09-28 DIAGNOSIS — Z7983 Long term (current) use of bisphosphonates: Secondary | ICD-10-CM

## 2023-09-28 DIAGNOSIS — E870 Hyperosmolality and hypernatremia: Secondary | ICD-10-CM | POA: Diagnosis not present

## 2023-09-28 DIAGNOSIS — Z90710 Acquired absence of both cervix and uterus: Secondary | ICD-10-CM

## 2023-09-28 DIAGNOSIS — B9620 Unspecified Escherichia coli [E. coli] as the cause of diseases classified elsewhere: Secondary | ICD-10-CM | POA: Diagnosis present

## 2023-09-28 DIAGNOSIS — E8770 Fluid overload, unspecified: Secondary | ICD-10-CM | POA: Diagnosis not present

## 2023-09-28 DIAGNOSIS — D72829 Elevated white blood cell count, unspecified: Secondary | ICD-10-CM | POA: Diagnosis present

## 2023-09-28 DIAGNOSIS — J9809 Other diseases of bronchus, not elsewhere classified: Secondary | ICD-10-CM | POA: Diagnosis present

## 2023-09-28 DIAGNOSIS — M810 Age-related osteoporosis without current pathological fracture: Secondary | ICD-10-CM | POA: Diagnosis present

## 2023-09-28 DIAGNOSIS — F419 Anxiety disorder, unspecified: Secondary | ICD-10-CM | POA: Diagnosis present

## 2023-09-28 DIAGNOSIS — R627 Adult failure to thrive: Secondary | ICD-10-CM | POA: Diagnosis present

## 2023-09-28 DIAGNOSIS — J189 Pneumonia, unspecified organism: Secondary | ICD-10-CM

## 2023-09-28 LAB — URINALYSIS, W/ REFLEX TO CULTURE (INFECTION SUSPECTED)
Bilirubin Urine: NEGATIVE
Glucose, UA: NEGATIVE mg/dL
Hgb urine dipstick: NEGATIVE
Ketones, ur: 5 mg/dL — AB
Nitrite: NEGATIVE
Protein, ur: NEGATIVE mg/dL
Specific Gravity, Urine: 1.016 (ref 1.005–1.030)
WBC, UA: >50 WBC/hpf (ref 0–5)
pH: 5 (ref 5.0–8.0)

## 2023-09-28 LAB — COMPREHENSIVE METABOLIC PANEL WITH GFR
ALT: 15 U/L (ref 0–44)
AST: 17 U/L (ref 15–41)
Albumin: 1.6 g/dL — ABNORMAL LOW (ref 3.5–5.0)
Alkaline Phosphatase: 115 U/L (ref 38–126)
Anion gap: 10 (ref 5–15)
BUN: 7 mg/dL — ABNORMAL LOW (ref 8–23)
CO2: 23 mmol/L (ref 22–32)
Calcium: 6.8 mg/dL — ABNORMAL LOW (ref 8.9–10.3)
Chloride: 106 mmol/L (ref 98–111)
Creatinine, Ser: 0.35 mg/dL — ABNORMAL LOW (ref 0.44–1.00)
GFR, Estimated: >60 mL/min (ref >60)
Glucose, Bld: 99 mg/dL (ref 70–99)
Potassium: 3 mmol/L — ABNORMAL LOW (ref 3.5–5.1)
Sodium: 139 mmol/L (ref 135–145)
Total Bilirubin: 0.9 mg/dL (ref 0.0–1.2)
Total Protein: 4.2 g/dL — ABNORMAL LOW (ref 6.5–8.1)

## 2023-09-28 LAB — LACTIC ACID, PLASMA: Lactic Acid, Venous: 1.3 mmol/L (ref 0.5–1.9)

## 2023-09-28 LAB — CBC WITH DIFFERENTIAL/PLATELET
Abs Immature Granulocytes: 0.1 10*3/uL — ABNORMAL HIGH (ref 0.00–0.07)
Basophils Absolute: 0 10*3/uL (ref 0.0–0.1)
Basophils Relative: 0 %
Eosinophils Absolute: 0 10*3/uL (ref 0.0–0.5)
Eosinophils Relative: 0 %
HCT: 36 % (ref 36.0–46.0)
Hemoglobin: 12.2 g/dL (ref 12.0–15.0)
Immature Granulocytes: 1 %
Lymphocytes Relative: 10 %
Lymphs Abs: 1.1 10*3/uL (ref 0.7–4.0)
MCH: 31.2 pg (ref 26.0–34.0)
MCHC: 33.9 g/dL (ref 30.0–36.0)
MCV: 92.1 fL (ref 80.0–100.0)
Monocytes Absolute: 0.5 10*3/uL (ref 0.1–1.0)
Monocytes Relative: 4 %
Neutro Abs: 9.8 10*3/uL — ABNORMAL HIGH (ref 1.7–7.7)
Neutrophils Relative %: 85 %
Platelets: 474 10*3/uL — ABNORMAL HIGH (ref 150–400)
RBC: 3.91 MIL/uL (ref 3.87–5.11)
RDW: 16.2 % — ABNORMAL HIGH (ref 11.5–15.5)
WBC: 11.6 10*3/uL — ABNORMAL HIGH (ref 4.0–10.5)
nRBC: 0 % (ref 0.0–0.2)

## 2023-09-28 LAB — RESP PANEL BY RT-PCR (RSV, FLU A&B, COVID)  RVPGX2
Influenza A by PCR: NEGATIVE
Influenza B by PCR: NEGATIVE
Resp Syncytial Virus by PCR: NEGATIVE
SARS Coronavirus 2 by RT PCR: NEGATIVE

## 2023-09-28 LAB — PROTIME-INR
INR: 1.2 (ref 0.8–1.2)
Prothrombin Time: 15.5 s — ABNORMAL HIGH (ref 11.4–15.2)

## 2023-09-28 LAB — TROPONIN I (HIGH SENSITIVITY)
Troponin I (High Sensitivity): 30 ng/L — ABNORMAL HIGH (ref <18)
Troponin I (High Sensitivity): 49 ng/L — ABNORMAL HIGH (ref <18)

## 2023-09-28 LAB — MAGNESIUM: Magnesium: 1.7 mg/dL (ref 1.7–2.4)

## 2023-09-28 MED ORDER — ONDANSETRON HCL 4 MG/2ML IJ SOLN: 4.0000 mg | Freq: Four times a day (QID) | INTRAMUSCULAR | Status: DC | PRN

## 2023-09-28 MED ORDER — ACETAMINOPHEN 650 MG RE SUPP
650.0000 mg | Freq: Four times a day (QID) | RECTAL | Status: AC | PRN
Start: 2023-09-28 — End: 2023-10-03

## 2023-09-28 MED ORDER — ONDANSETRON HCL 4 MG PO TABS: 4.0000 mg | ORAL_TABLET | Freq: Four times a day (QID) | ORAL | Status: DC | PRN

## 2023-09-28 MED ORDER — HEPARIN (PORCINE) 25000 UT/250ML-% IV SOLN
750.0000 [IU]/h | INTRAVENOUS | Status: DC
  Administered 2023-09-28: 700 [IU]/h via INTRAVENOUS
  Filled 2023-09-28: qty 250

## 2023-09-28 MED ORDER — MIDODRINE HCL 5 MG PO TABS
2.5000 mg | ORAL_TABLET | Freq: Three times a day (TID) | ORAL | Status: DC
  Administered 2023-09-29 – 2023-10-02 (×8): 2.5 mg via ORAL
  Filled 2023-09-28 (×9): qty 1

## 2023-09-28 MED ORDER — MELATONIN 5 MG PO TABS
5.0000 mg | ORAL_TABLET | Freq: Every day | ORAL | Status: DC
  Administered 2023-09-29 – 2023-10-01 (×3): 5 mg via ORAL
  Filled 2023-09-28 (×4): qty 1

## 2023-09-28 MED ORDER — METRONIDAZOLE 500 MG/100ML IV SOLN
500.0000 mg | Freq: Once | INTRAVENOUS | Status: AC
Start: 2023-09-28 — End: 2023-09-28
  Administered 2023-09-28: 500 mg via INTRAVENOUS
  Filled 2023-09-28: qty 100

## 2023-09-28 MED ORDER — POTASSIUM CHLORIDE CRYS ER 20 MEQ PO TBCR
40.0000 meq | EXTENDED_RELEASE_TABLET | Freq: Once | ORAL | Status: AC
  Administered 2023-09-28: 40 meq via ORAL
  Filled 2023-09-28: qty 2

## 2023-09-28 MED ORDER — VANCOMYCIN HCL IN DEXTROSE 1-5 GM/200ML-% IV SOLN
1000.0000 mg | Freq: Once | INTRAVENOUS | Status: AC
Start: 2023-09-28 — End: 2023-09-28
  Administered 2023-09-28: 1000 mg via INTRAVENOUS
  Filled 2023-09-28: qty 200

## 2023-09-28 MED ORDER — MIDODRINE HCL 5 MG PO TABS
2.5000 mg | ORAL_TABLET | Freq: Once | ORAL | Status: AC
  Administered 2023-09-28: 2.5 mg via ORAL
  Filled 2023-09-28: qty 1

## 2023-09-28 MED ORDER — SENNOSIDES-DOCUSATE SODIUM 8.6-50 MG PO TABS: 1.0000 | ORAL_TABLET | Freq: Every evening | ORAL | Status: DC | PRN

## 2023-09-28 MED ORDER — BISACODYL 5 MG PO TBEC: 5.0000 mg | DELAYED_RELEASE_TABLET | Freq: Every day | ORAL | Status: DC | PRN

## 2023-09-28 MED ORDER — SODIUM CHLORIDE 0.9 % IV SOLN
500.0000 mg | INTRAVENOUS | Status: AC
  Administered 2023-09-28 – 2023-09-30 (×3): 500 mg via INTRAVENOUS
  Filled 2023-09-28 (×3): qty 5

## 2023-09-28 MED ORDER — TRAZODONE HCL 50 MG PO TABS
25.0000 mg | ORAL_TABLET | Freq: Every evening | ORAL | Status: DC | PRN
  Administered 2023-09-29: 25 mg via ORAL
  Filled 2023-09-28: qty 1

## 2023-09-28 MED ORDER — SODIUM CHLORIDE 0.9 % IV SOLN
2.0000 g | INTRAVENOUS | Status: AC
  Administered 2023-09-29 – 2023-10-02 (×4): 2 g via INTRAVENOUS
  Filled 2023-09-28 (×4): qty 20

## 2023-09-28 MED ORDER — HEPARIN BOLUS VIA INFUSION
2900.0000 [IU] | Freq: Once | INTRAVENOUS | Status: AC
  Administered 2023-09-28: 2900 [IU] via INTRAVENOUS
  Filled 2023-09-28: qty 2900

## 2023-09-28 MED ORDER — ACETAMINOPHEN 325 MG PO TABS
650.0000 mg | ORAL_TABLET | Freq: Four times a day (QID) | ORAL | Status: AC | PRN
Start: 2023-09-28 — End: 2023-10-03
  Administered 2023-09-30: 650 mg via ORAL
  Filled 2023-09-28: qty 2

## 2023-09-28 MED ORDER — PANTOPRAZOLE SODIUM 40 MG PO TBEC
40.0000 mg | DELAYED_RELEASE_TABLET | Freq: Every day | ORAL | Status: DC
  Administered 2023-09-29 – 2023-10-02 (×3): 40 mg via ORAL
  Filled 2023-09-28 (×4): qty 1

## 2023-09-28 MED ORDER — SODIUM CHLORIDE 0.9 % IV BOLUS
500.0000 mL | Freq: Once | INTRAVENOUS | Status: AC
  Administered 2023-09-28: 500 mL via INTRAVENOUS

## 2023-09-28 MED ORDER — IOHEXOL 350 MG/ML SOLN
75.0000 mL | Freq: Once | INTRAVENOUS | Status: AC | PRN
  Administered 2023-09-28: 75 mL via INTRAVENOUS

## 2023-09-28 MED ORDER — SODIUM CHLORIDE 0.9 % IV SOLN
2.0000 g | Freq: Once | INTRAVENOUS | Status: AC
  Administered 2023-09-28: 2 g via INTRAVENOUS
  Filled 2023-09-28: qty 12.5

## 2023-09-28 MED ORDER — SODIUM CHLORIDE 0.9 % IV BOLUS
1000.0000 mL | Freq: Once | INTRAVENOUS | Status: AC
  Administered 2023-09-28: 1000 mL via INTRAVENOUS

## 2023-09-28 NOTE — ED Provider Notes (Signed)
 [facility] Provider Note    Event Date/Time   First MD Initiated Contact with Patient 09/28/23 1147     (approximate)   History   Hypotension   HPI  Selena Pittman is a 73 y.o. female with bipolar, breast cancer, anxiety, depression, hypothyroidism who comes in with concerns for altered mental status and hypoxia.  I reviewed a note from 07/27/2023 where patient also came in for lethargy found to be septic shock did require boluses and found to have E. coli UTI she.  She was discharged on midodrine  2.5 3 times daily.  I reviewed this urine culture from 4/at bedtime 2025 and it was sensitive to cefepime .  She comes in from [facility] due to concerns for low blood pressures in the 60s.  After 500 cc of fluid EMS blood pressure went up to 90s over 50s.  They were also concerned that patient had a temperature of 99.6 axillary as well as some hypoxia requiring 2 L.  She has had a worsening cough.   Physical Exam   Triage Vital Signs: ED Triage Vitals  Encounter Vitals Group     BP 09/28/23 1131 (!) 83/64     Systolic BP Percentile --      Diastolic BP Percentile --      Pulse Rate 09/28/23 1131 (!) 112     Resp 09/28/23 1131 18     Temp 09/28/23 1124 98.3 F (36.8 C)     Temp Source 09/28/23 1124 Oral     SpO2 09/28/23 1131 93 %     Weight --      Height --      Head Circumference --      Peak Flow --      Pain Score 09/28/23 1119 0     Pain Loc --      Pain Education --      Exclude from Growth Chart --     Most recent vital signs: Vitals:   09/28/23 1202 09/28/23 1300  BP: 90/63   Pulse:  (!) 105  Resp:  18  Temp:    SpO2:  95%     General: Awake, no distress.  CV:  Good peripheral perfusion.  Resp:  Normal effort.  Clear lungs no wheezing Abd:  No distention.  She does report a little bit of tenderness in her abdomen. Other:  No swelling in legs no calf tenderness   ED Results / Procedures / Treatments   Labs (all  labs ordered are listed, but only abnormal results are displayed) Labs Reviewed  CBC WITH DIFFERENTIAL/PLATELET - Abnormal; Notable for the following components:      Result Value   WBC 11.6 (*)    RDW 16.2 (*)    Platelets 474 (*)    Neutro Abs 9.8 (*)    Abs Immature Granulocytes 0.10 (*)    All other components within normal limits  TROPONIN I (HIGH SENSITIVITY) - Abnormal; Notable for the following components:   Troponin I (High Sensitivity) 30 (*)    All other components within normal limits  CULTURE, BLOOD (ROUTINE X 2)  CULTURE, BLOOD (ROUTINE X 2)  RESP PANEL BY RT-PCR (RSV, FLU A&B, COVID)  [id number]  LACTIC ACID, PLASMA  URINALYSIS, W/ REFLEX TO CULTURE (INFECTION SUSPECTED)  COMPREHENSIVE METABOLIC PANEL WITH GFR  PROTIME-INR  TROPONIN I (HIGH SENSITIVITY)     EKG  My interpretation of EKG:  Sinus tachycardia rate of 111 without any ST elevation or T  wave inversion, QTc of 46  RADIOLOGY I have reviewed the xray personally and interpreted patient has some scattered infiltrates that could be secondary to pneumonia   PROCEDURES:  Critical Care performed: Yes, see critical care procedure note(s)  .1-3 Lead EKG Interpretation  Performed by: Lubertha Rush, MD Authorized by: Lubertha Rush, MD     Interpretation: abnormal     ECG rate:  100   ECG rate assessment: tachycardic     Rhythm: sinus tachycardia     Ectopy: none     Conduction: normal   .Critical Care  Performed by: Lubertha Rush, MD Authorized by: Lubertha Rush, MD   Critical care provider statement:    Critical care time (minutes):  30   Critical care was necessary to treat or prevent imminent or life-threatening deterioration of the following conditions:  Sepsis   Critical care was time spent personally by me on the following activities:  Development of treatment plan with patient or surrogate, discussions with consultants, evaluation of patient's response to treatment, examination of patient,  ordering and review of laboratory studies, ordering and review of radiographic studies, ordering and performing treatments and interventions, pulse oximetry, re-evaluation of patient's condition and review of old charts    MEDICATIONS ORDERED IN ED: Medications  metroNIDAZOLE (FLAGYL) IVPB 500 mg (500 mg Intravenous New Bag/Given 09/28/23 1254)  vancomycin  (VANCOCIN ) IVPB 1000 mg/200 mL premix (has no administration in time range)  sodium chloride  0.9 % bolus 1,000 mL (1,000 mLs Intravenous New Bag/Given 09/28/23 1151)  midodrine  (PROAMATINE ) tablet 2.5 mg (2.5 mg Oral Given 09/28/23 1157)  ceFEPIme  (MAXIPIME ) 2 g in sodium chloride  0.9 % 100 mL IVPB (0 g Intravenous Stopped 09/28/23 1226)     IMPRESSION / MDM / ASSESSMENT AND PLAN / ED COURSE  I reviewed the triage vital signs and the nursing notes.   Patient's presentation is most consistent with acute presentation with potential threat to life or bodily function.   Patient comes in with cough, hypotension, fevers, low blood pressure.  Sepsis alert called patient started on broad-spectrum antibiotics.  Patient was given midodrine  to help with blood pressures given she is on the at baseline and IV fluids.  Given patient's not the best historian will get CT imaging evaluate for intercranial hemorrhage, PE, pneumonia, kidney stone, acute abdominal pathology  CBC shows elevated white count.  Lactate is normal.  Show troponin slightly elevated.  COVID and flu were negative  On repeat assessment patient is appearing much more alert.  Her blood pressures have improved significantly.  She is now reporting the cough is more chronic in nature.  We are waiting on CT imaging which there is a delay secondary to only 1 scanner.  Patient be handed off to oncoming team pending CT imaging and admission  Patient was given full fluid resuscitation  The patient is on the cardiac monitor to evaluate for evidence of arrhythmia and/or significant heart rate  changes.      FINAL CLINICAL IMPRESSION(S) / ED DIAGNOSES   Final diagnoses:  Weakness     Rx / DC Orders   ED Discharge Orders     None        Note:  This document was prepared using Dragon voice recognition software and may include unintentional dictation errors.   Lubertha Rush, MD 09/28/23 [phone]

## 2023-09-28 NOTE — Assessment & Plan Note (Signed)
 Present on admission Continue treatment ceftriaxone  for pneumonia Urine cultures pending

## 2023-09-28 NOTE — H&P (Addendum)
 History and Physical   Selena Pittman HYQ:[id number] DOB: 03/12/1951 DOA: 09/28/2023  PCP: Sharyne Degree, FNP Outpatient Specialists: Dr. Darnelle Elders, pulmonology Patient coming from: [facility], via EMS  I have personally briefly reviewed patient's old medical records in [facility] EMR.  Chief Concern: hypoxia, hypotension  HPI: [name] is a 73 year old female with history of hypertension, on midodrine , GERD, insomnia, left breast cancer, anxiety, depression, hypothyroid, bipolar, who presents ED for chief concerns of hypotension and hypoxia at [facility] house.  Vitals in the ED showed t 98.3, respiration rate of 18, heart rate 112, blood pressure 83/64, improved to 99/67, SpO2 of 92% on 2 L nasal cannula.  Serum sodium is 139, potassium 3.0, chloride one 0.6, bicarb 23, BUN of 7, serum creatinine 0.35, EGFR greater than 60, nonfasting blood glucose 99, WBC 11.6, hemoglobin 12.2, platelets of 474.  HS troponin is 38, on repeat is 49.  COVID/influenza A/influenza B/RSV PCR were negative.  Lactic acid is 1.3.  ED treatment: Heparin bolus and gtt., vancomycin , cefepime , sodium chloride  1.5 liter bolus, midodrine  2.5 mg p.o. one-time dose. ---------------------------------- At bedside, patient able to tell me her first and last name, age, location, current calendar year.  Patient had to be prompted with current calendar year.  She states her age is 56.  She does not know why she is in the hospital, she states that she must be feeling sick.  She reports she had some shortness of breath although she was not able to tell me for how long.  Social history: Patient denies tobacco, EtOH, recreational drug use.  She is currently at living facility.  ROS: Unable to complete due to patient exhibiting mild to moderate signs of dementia/neurodeficit.  ED Course: Discussed with EDP, patient requiring hospitalization for chief concerns of pneumonia with hypoxia and left  lower lobe PE.  Assessment/Plan  Principal Problem:   Severe sepsis with acute organ dysfunction (HCC) Active Problems:   Bipolar disorder (HCC)   History of breast cancer   Tobacco use   GERD (gastroesophageal reflux disease)   Schizophrenia (HCC)   Leukocytosis   Hypokalemia   Generalized weakness   Hyperlipidemia   Multilobar lung infiltrate   Acute pulmonary embolism (HCC)   Right-sided sensory deficit present   Pyuria   Assessment and Plan:  * Severe sepsis with acute organ dysfunction (HCC) Blood cultures x 2 are in process Zithromycin and ceftriaxone  IV to complete a 5-day course UA has been ordered and pending collection Maintain MAP > 65  Pyuria Present on admission Continue treatment ceftriaxone  for pneumonia  Right-sided sensory deficit present Patient does not know how long this has been going on Per son, patient is right handed MRI of the brain ordered  Acute pulmonary embolism (HCC) Right lower lobe PE Continue with heparin bolus and gtt. Complete echo  Multilobar lung infiltrate R > L Check MRSA PCR Azithromycin  500 mg IV daily, ceftriaxone  2 g IV daily to complete a 5-day course ordered on admission Continuous pulse oximetry  Hypokalemia Potassium chloride  40 mill COVID p.o. one-time dose ordered on admission Check serum magnesium  level on admission  Chart reviewed.   DVT prophylaxis: Heparin Code Status: Full code Diet: Heart healthy Family Communication: Updated with son, Landon Pinion over the phone Disposition Plan: Pending clinical course Consults called: None at this time Admission status: PCU, inpatient  Past Medical History:  Diagnosis Date   Bipolar affective (HCC)    Breast cancer (HCC)    Cancer (HCC) 12/11/2014  INVASIVE MAMMARY CARCINOMA/.left breast/ T2 N0   Cardiomegaly    Depression    Emphysema of lung (HCC)    Endometriosis    Hyperlipidemia    Osteoporosis    Schizoaffective disorder (HCC)    Schizophrenia (HCC)     Severe sepsis (HCC) 04/02/2022   Thyroid  nodule    Vitamin D  deficiency    Past Surgical History:  Procedure Laterality Date   ABDOMINAL HYSTERECTOMY     BREAST BIOPSY Left 12-11-14   INVASIVE MAMMARY CARCINOMA.   BREAST LUMPECTOMY WITH SENTINEL LYMPH NODE BIOPSY Left 12/31/2014   Procedure: LEFT BREAST LUMPECTOMY WITH ULTRASOUND GUIDED NEEDLE LOCALIZATION, SENTINEL LYMPH NODE BIOPSY ;  Surgeon: Jerlean Mood, MD;  Location: [facility] ORS;  Service: General;  Laterality: Left;   DIAGNOSTIC MAMMOGRAM  12/04/2014   Done at [facility] Imaging Category 5-Left Breast   DILATION AND CURETTAGE OF UTERUS     MASTECTOMY Left 2016   SIMPLE MASTECTOMY WITH AXILLARY SENTINEL NODE BIOPSY Left 01/14/2015   Procedure: SIMPLE MASTECTOMY;  Surgeon: Jerlean Mood, MD;  Location: [facility] ORS;  Service: General;  Laterality: Left;   TENDON REPAIR Right    hand   TONSILLECTOMY     TUBAL LIGATION     Social History:  reports that she has been smoking cigarettes. She has a 45 pack-year smoking history. She has never used smokeless tobacco. She reports that she does not drink alcohol and does not use drugs.  Allergies  Allergen Reactions   Cat Dander    Lactose Intolerance (Gi)     Diarrhea    Mixed Ragweed    Peanut-Containing Drug Products    Family History  Problem Relation Age of Onset   Cancer Mother        uterine   Heart attack Father    Breast cancer Neg Hx    Family history: Family history reviewed and not pertinent.  Prior to Admission medications   Medication Sig Start Date End Date Taking? Authorizing Provider  acetaminophen  (TYLENOL ) 325 MG tablet Take 2 tablets (650 mg total) by mouth every 6 (six) hours as needed for mild pain, fever or headache. 11/04/22   Garrison Kanner, MD  acidophilus (RISAQUAD) CAPS capsule Take 2 capsules by mouth 3 (three) times daily. 08/03/23   Verla Glaze, MD  alendronate  (FOSAMAX ) 70 MG tablet Take 1 tablet (70 mg total) by mouth once a week. Take with a  full glass of water on an empty stomach. 11/04/22   Garrison Kanner, MD  cetirizine  (ZYRTEC ) 10 MG tablet Take 1 tablet (10 mg total) by mouth daily. 11/04/22   Garrison Kanner, MD  cholecalciferol  (VITAMIN D ) 1000 units tablet Take 2 tablets (2,000 Units total) by mouth daily. 11/04/22   Garrison Kanner, MD  clotrimazole  (LOTRIMIN ) 1 % cream Apply bid to areas of redness 08/03/23   Verla Glaze, MD  cloZAPine  (CLOZARIL ) 100 MG tablet Take 1 tablet (100 mg total) by mouth 2 (two) times daily. 08/03/23   Verla Glaze, MD  coal tar-salicylic acid  2 % shampoo Use to wash scalp Tuesday, Thursday and Saturdays. 11/04/22   Garrison Kanner, MD  docusate sodium  (COLACE) 100 MG capsule Take 1 capsule (100 mg total) by mouth 2 (two) times daily. 11/04/22   Garrison Kanner, MD  feeding supplement (ENSURE ENLIVE / ENSURE PLUS) LIQD Take 237 mLs by mouth 2 (two) times daily between meals. 08/03/23   Verla Glaze, MD  fluvoxaMINE  (LUVOX ) 50 MG tablet Take 1 tablet (50 mg total) by  mouth 2 (two) times daily. 11/04/22   Garrison Kanner, MD  glycerin  adult 2 g suppository Place 1 suppository rectally as needed for constipation. 11/04/22   Garrison Kanner, MD  ketoconazole  (NIZORAL ) 2 % shampoo APPLY TO AFFECTED AREA TWICE A WEEK FOR 8 WEEKS; THEN USE AS NEEDED. 11/04/22   Garrison Kanner, MD  letrozole  (FEMARA ) 2.5 MG tablet Take 1 tablet (2.5 mg total) by mouth daily. 11/04/22   Garrison Kanner, MD  LINZESS  145 MCG CAPS capsule Take 1 capsule (145 mcg total) by mouth daily. 11/04/22   Garrison Kanner, MD  liver oil-zinc  oxide (DESITIN) 40 % ointment Apply topically as needed for irritation. 11/04/22   Garrison Kanner, MD  melatonin 3 MG TABS tablet Take 6 mg by mouth at bedtime.    [provider]  midodrine  (PROAMATINE ) 2.5 MG tablet Take 1 tablet (2.5 mg total) by mouth 3 (three) times daily with meals. 08/03/23   Verla Glaze, MD  montelukast  (SINGULAIR ) 5 MG chewable tablet Chew 1 tablet (5 mg total) by mouth at bedtime. 11/04/22   Garrison Kanner, MD  Multiple Vitamin  (MULTIVITAMIN WITH MINERALS) TABS tablet Take 1 tablet by mouth daily. 08/03/23   Verla Glaze, MD  omeprazole  (PRILOSEC) 20 MG capsule Take 1 capsule (20 mg total) by mouth daily. 11/04/22   Garrison Kanner, MD  ondansetron  (ZOFRAN ) 4 MG tablet Take 4 mg by mouth every 6 (six) hours as needed for nausea or vomiting. 07/22/23   [provider]  polyethylene glycol (MIRALAX  / GLYCOLAX ) 17 g packet Take 17 g by mouth daily as needed for moderate constipation. 08/03/23   Verla Glaze, MD  thiamine  (VITAMIN B1) 100 MG tablet Take 1 tablet (100 mg total) by mouth daily. 08/03/23   Verla Glaze, MD  traZODone (DESYREL) 50 MG tablet Take 25 mg by mouth at bedtime as needed. 07/26/23   [provider]   Physical Exam: Vitals:   09/28/23 1552 09/28/23 1600 09/28/23 1730 09/28/23 1734  BP: 99/67   108/63  Pulse:   97   Resp:   (!) 21   Temp:      TempSrc:      SpO2:   97%   Weight:  41.3 kg    Height:  5\' 5"  (1.651 m)     Constitutional: appears frail, cachectic, malnourished Eyes: PERRL, lids and conjunctivae normal ENMT: Mucous membranes are moist. Posterior pharynx clear of any exudate or lesions. Age-appropriate dentition. Hearing appropriate Neck: normal, supple, no masses, no thyromegaly Respiratory: clear to auscultation bilaterally, no wheezing, no crackles. Normal respiratory effort. No accessory muscle use.  Cardiovascular: Regular rate and rhythm, no murmurs / rubs / gallops. No extremity edema. 2+ pedal pulses. No carotid bruits.  Abdomen: no tenderness, no masses palpated, no hepatosplenomegaly. Bowel sounds positive.  Musculoskeletal: no clubbing / cyanosis. No joint deformity upper and lower extremities. Good ROM, no contractures, no atrophy. Normal muscle tone.  Skin: no rashes, lesions, ulcers. No induration Neurologic: Sensation intact. Strength 4/5 in left upper and left lower extremity.  No strength or sensation exhibited in the right upper and right lower  extremities. Psychiatric: Unable to assess judgment and insight. Alert and oriented x 3.  Depressed mood.   EKG: independently reviewed, showing sinus tachycardia with rate of 111, QTc 486  Chest x-ray on Admission: I personally reviewed and I agree with radiologist reading as below.  CT Angio Chest PE W and/or Wo Contrast Result Date: 09/28/2023 CLINICAL DATA:  Pulmonary embolism suspected EXAM:  CT ANGIOGRAPHY CHEST WITH CONTRAST TECHNIQUE: Multidetector CT imaging of the chest was performed using the standard protocol during bolus administration of intravenous contrast. Multiplanar CT image reconstructions and MIPs were obtained to evaluate the vascular anatomy. RADIATION DOSE REDUCTION: This exam was performed according to the departmental dose-optimization program which includes automated exposure control, adjustment of the mA and/or kV according to patient size and/or use of iterative reconstruction technique. CONTRAST:  75mL OMNIPAQUE  IOHEXOL  350 MG/ML SOLN COMPARISON:  CT angio chest October 29, 2022 FINDINGS: Cardiovascular: Small intraluminal filling defects in the peripheral branches of the right lower lobe pulmonary artery with a small intraluminal filling defects within the left distal interlobar artery extending into the lower lobe segments consistent with pulmonary embolism without evidence of ventricular dysfunction. Ventricular ratio of 0.9. Moderate coronary artery calcifications Mediastinum/Nodes: No enlarged mediastinal, hilar, or axillary lymph nodes. Thyroid  gland, trachea, and esophagus demonstrate no significant findings. Lungs/Pleura: Right lower lobe consolidating pneumonia with ill-defined left lower lobe infiltrates and atelectasis as well as ill-defined patchy nodular infiltrates of the upper lobes, right worse than left and atelectatic changes of the right upper lobe anterior segment and right middle lobe. A nodular parenchymal density in the peripheral portion of the lateral  segment of the left lower lobe image number 85 measures 1.6 x 1.2 cm and may represent inflammatory infiltrates and close follow-up CT recommended 4-6 weeks. Upper Abdomen: No acute abnormality. Musculoskeletal: No chest wall abnormality. No acute or significant osseous findings. Review of the MIP images confirms the above findings. IMPRESSION: Pulmonary embolism as described. Pulmonary infiltrates correlate with pneumonia right worse than left. 1.6 x 1.2 cm left lower lobe parenchymal nodular density may represent pulmonary infiltrates or atelectasis. Electronically Signed   By: Fredrich Jefferson M.D.   On: 09/28/2023 16:33   CT HEAD WO CONTRAST ( ) Result Date: 09/28/2023 CLINICAL DATA:  Head trauma, minor (Age >= 65y) EXAM: CT HEAD WITHOUT CONTRAST TECHNIQUE: Contiguous axial images were obtained from the base of the skull through the vertex without intravenous contrast. RADIATION DOSE REDUCTION: This exam was performed according to the departmental dose-optimization program which includes automated exposure control, adjustment of the mA and/or kV according to patient size and/or use of iterative reconstruction technique. COMPARISON:  CT of the head dated July 28, 2023. FINDINGS: Brain: Diffuse age related cerebral volume loss and moderate periventricular and deep cerebral white matter disease. No evidence of acute intracranial injury. Vascular: Unremarkable. Skull: Intact. Sinuses/Orbits: Moderate mucosal disease within the sphenoid, left maxillary and posterior ethmoid sinuses. Orbits are unremarkable. Other: None. IMPRESSION: 1. Age related atrophy and a mild to moderate cerebral white matter disease. 2.  Moderate paranasal sinus disease. Electronically Signed   By: Maribeth Shivers M.D.   On: 09/28/2023 16:30   CT ABDOMEN PELVIS W CONTRAST Result Date: 09/28/2023 CLINICAL DATA:  Choose 2 EXAM: CT ABDOMEN AND PELVIS WITHOUT CONTRAST TECHNIQUE: Multidetector CT imaging of the abdomen and pelvis was  performed following the standard protocol without IV contrast. RADIATION DOSE REDUCTION: This exam was performed according to the departmental dose-optimization program which includes automated exposure control, adjustment of the mA and/or kV according to patient size and/or use of iterative reconstruction technique. COMPARISON:  July 29, 2023 FINDINGS: Lower chest: Right lower lobe consolidative pneumonia. Intraluminal filling defects of the left lower lobe pulmonary artery correlate with pulmonary embolism which correlates with concomitant CT chest. Which demonstrates pulmonary embolism within the left main pulmonary artery. Hepatobiliary: Liver normal size no masses no biliary dilatation. Gallbladder  unremarkable. No gallstones. Pancreas: Pancreas normal size. No masses calcifications or inflammatory changes. Spleen: Spleen normal size.  No masses. Adrenals/Urinary Tract: Adrenal glands are normal size. Follow-up recommended. Kidneys are normal. No masses calcifications or hydronephrosis Stomach/Bowel: No small or large bowel obstruction. Suggestive of mild mucosal thickening involving the proximal descending colon as well as possible mucosal thickening of the proximal ascending colon cecum and distal ileal loops that could correlate with enterocolitis. No solid organ injury. Fecal impaction in the rectum. Moderate amount of residual fecal material throughout the colon without obstruction or constipation. Vascular/Lymphatic: No significant vascular findings are present. No enlarged abdominal or pelvic lymph nodes. Reproductive: Status post hysterectomy. No adnexal masses. Other: Anterior abdominal wall unremarkable without evidence of umbilical or inguinal hernias Musculoskeletal: Visualized portion of the thoracolumbar spine and pelvic structures grossly unremarkable without evidence of fracture bony abnormalities or soft tissue masses. IMPRESSION: *Right lower lobe consolidative pneumonia. *Intraluminal  filling defects of the left lower lobe pulmonary artery correlate with pulmonary embolism which correlates with concomitant CT chest. *Suggestive of mild mucosal thickening involving the proximal descending colon as well as possible mucosal thickening of the proximal ascending colon cecum and distal ileal loops that could correlate with enterocolitis. *Fecal impaction in the rectum. Moderate amount of residual fecal material throughout the colon without obstruction or constipation. Electronically Signed   By: Fredrich Jefferson M.D.   On: 09/28/2023 16:29   DG Chest Port 1 View Result Date: 09/28/2023 CLINICAL DATA:  Questionable sepsis. EXAM: PORTABLE CHEST 1 VIEW COMPARISON:  Chest radiograph dated 07/27/2023. FINDINGS: Faint bilateral peripheral and subpleural densities suspicious for atypical pneumonia. Clinical correlation is recommended. No focal consolidation, pleural effusion, or pneumothorax. The cardiac silhouette is within limits. No acute osseous pathology. Osteopenia with degenerative changes. IMPRESSION: Findings suspicious for atypical pneumonia. Electronically Signed   By: Angus Bark M.D.   On: 09/28/2023 12:02   Labs on Admission: I have personally reviewed following labs  CBC: Recent Labs  Lab 09/28/23 1132  WBC 11.6*  NEUTROABS 9.8*  HGB 12.2  HCT 36.0  MCV 92.1  PLT 474*   Basic Metabolic Panel: Recent Labs  Lab 09/28/23 1248  NA 139  K 3.0*  CL 106  CO2 23  GLUCOSE 99  BUN 7*  CREATININE 0.35*  CALCIUM  6.8*  MG 1.7   GFR: Estimated Creatinine Clearance: 42.1 mL/min (A) (by C-G formula based on SCr of 0.35 mg/dL (L)).  Liver Function Tests: Recent Labs  Lab 09/28/23 1248  AST 17  ALT 15  ALKPHOS 115  BILITOT 0.9  PROT 4.2*  ALBUMIN  1.6*   Coagulation Profile: Recent Labs  Lab 09/28/23 1248  INR 1.2   Urine analysis:    Component Value Date/Time   COLORURINE AMBER (A) 09/28/2023 1132   APPEARANCEUR HAZY (A) 09/28/2023 1132   LABSPEC 1.016  09/28/2023 1132   PHURINE 5.0 09/28/2023 1132   GLUCOSEU NEGATIVE 09/28/2023 1132   HGBUR NEGATIVE 09/28/2023 1132   BILIRUBINUR NEGATIVE 09/28/2023 1132   KETONESUR 5 (A) 09/28/2023 1132   PROTEINUR NEGATIVE 09/28/2023 1132   NITRITE NEGATIVE 09/28/2023 1132   LEUKOCYTESUR MODERATE (A) 09/28/2023 1132   CRITICAL CARE Performed by: Dr. Reinhold Carbine  Total critical care time: 32 minutes  Critical care time was exclusive of separately billable procedures and treating other patients.  Critical care was necessary to treat or prevent imminent or life-threatening deterioration.  Critical care was time spent personally by me on the following activities: development of treatment plan with  patient and son, Landon Pinion, as well as nursing, discussions with consultants, evaluation of patient's response to treatment, examination of patient, obtaining history from patient or surrogate, ordering and performing treatments and interventions, ordering and review of laboratory studies, ordering and review of radiographic studies, pulse oximetry and re-evaluation of patient's condition.  This document was prepared using Dragon Voice Recognition software and may include unintentional dictation errors.  Dr. Reinhold Carbine [facility]  If 7PM-7AM, please contact overnight-coverage provider If 7AM-7PM, please contact day attending provider [email or web address]  09/28/2023, 8:26 PM

## 2023-09-28 NOTE — Assessment & Plan Note (Addendum)
 R > L, MRSA PCR positive -Management as above

## 2023-09-28 NOTE — Assessment & Plan Note (Addendum)
 Patient met sepsis criteria with leukocytosis, tachycardia and tachypnea.  No endorgan damage. Mild hypoxia with on admission but she also had PE. Concern of bilateral pneumonia.  MRSA PCR positive Imaging also concerning for sinusitis, UA concern of UTI Preliminary blood cultures negative, urine and sputum cultures pending. - Continue with ceftriaxone  and Zithromax  -Add vancomycin  -Continue with supportive care

## 2023-09-28 NOTE — Assessment & Plan Note (Signed)
 Right lower lobe PE, lower extremity venous Doppler positive for extensive bilateral DVT, right greater than left - Vascular surgery was also consulted to see if she is a candidate for any procedure. - Patient was initially placed on heparin infusion-will switch to Xarelto

## 2023-09-28 NOTE — Assessment & Plan Note (Addendum)
 Patient does not know how long this has been going on Per son, patient is right handed MRI of the brain ordered

## 2023-09-28 NOTE — ED Triage Notes (Signed)
 Pt arrives from [facility] via EMS for hypotension and hypoxia. Pt received 500mL NS.    93/52  106 HR CBG 111

## 2023-09-28 NOTE — Hospital Course (Addendum)
 [name] is a 73 year old female with history of hypertension, on midodrine , GERD, insomnia, left breast cancer, anxiety, depression, hypothyroid, bipolar, who presents ED for chief concerns of hypotension and hypoxia at [facility] house.  Vitals in the ED showed t 98.3, respiration rate of 18, heart rate 112, blood pressure 83/64, improved to 99/67, SpO2 of 92% on 2 L nasal cannula.  Serum sodium is 139, potassium 3.0, chloride one 0.6, bicarb 23, BUN of 7, serum creatinine 0.35, EGFR greater than 60, nonfasting blood glucose 99, WBC 11.6, hemoglobin 12.2, platelets of 474.HS troponin is 38, on repeat is 49. COVID/influenza A/influenza B/RSV PCR were negative.Lactic acid is 1.3. Chest x-ray with concern of atypical pneumonia. CTA chest with small bilateral PE and bilateral infiltrate concerning for pneumonia CT head with no acute abnormality, age-related atrophy and moderate paranasal sinus disease.  CT abdomen and pelvis with concern of enterocolitis and fecal impaction in rectum.  ED treatment: Heparin  bolus and gtt., vancomycin , cefepime , sodium chloride  1.5 liter bolus, midodrine  2.5 mg p.o. one-time dose.  6/11: Vital stable, preliminary blood cultures negative.  MRI brain with no acute intracranial abnormality.  Mucosal thickening with air-fluid level in left sphenoid and maxillary sinuses more consistent with acute sinusitis. MRSA PCR positive so adding vancomycin . Lower extremity venous Doppler with extensive bilateral DVT, right greater than left.  Echocardiogram was normal. Vascular surgery was consulted to see if she is a candidate for any intervention or IVC filter placement.  6/12: Patient was little more lethargic this morning so ABG was done, it shows pH of 7.51, CO2 30 and mild hypoxia with pO2 of 65.  Placed on 2 L of oxygen.  Later she did woke up.  Procalcitonin negative so checking respiratory viral panel as he might be having viral pneumonia.  Urine cultures with  ESBL E. coli-ordered 1 dose of fosfomycin. Based on her debility-vascular evaluated her and she will not be a candidate for any intervention.  Heparin  is being switched with Xarelto . Chest PT ordered as she remained quite congested. Mild hypokalemia and hypomagnesemia-replating electrolytes  6/13: Patient remained quite congested.  Respiratory viral panel positive for rhino and parainfluenza virus.  Mild hypophosphatemia today which is being repleted.  6/14: Patient with increased respiratory distress as she has a very weak cough and unable to clear her secretions.  Requiring frequent suctioning.  She became tachycardic, hypotensive and tachypneic was placed on nonrebreather.  Son was called and he wants to keep her full scope of care and full code at this time, patient was transferred to ICU and she is being intubated.  A lot of respiratory secretions. Palliative care was consulted yesterday to continue goals of care discussion as patient is very frail and cachectic with significant underlying comorbidities and will not be able to tolerate any CPR if needed.

## 2023-09-28 NOTE — Assessment & Plan Note (Signed)
 Resolved with repletion.  Magnesium  was low normal at 1.7 -Continue to monitor and replete as needed

## 2023-09-28 NOTE — Progress Notes (Signed)
 PHARMACY - ANTICOAGULATION CONSULT NOTE  Pharmacy Consult for heparin Indication: pulmonary embolus  Allergies  Allergen Reactions   Cat Dander    Lactose Intolerance (Gi)     Diarrhea    Mixed Ragweed    Peanut-Containing Drug Products    Patient Measurements: Height: 5\' 5"  (165.1 cm) Weight: 41.3 kg (91 lb) IBW/kg (Calculated) : 57 HEPARIN DW (KG): 41.3  Vital Signs: Temp: 98.3 F (36.8 C) (06/10 1124) Temp Source: Oral (06/10 1124) BP: 99/67 (06/10 1552) Pulse Rate: 95 (06/10 1545)  Labs: Recent Labs    09/28/23 1132 09/28/23 1248  HGB 12.2  --   HCT 36.0  --   PLT 474*  --   LABPROT  --  15.5*  INR  --  1.2  CREATININE  --  0.35*  TROPONINIHS 30* 49*    CrCl cannot be calculated (Unknown ideal weight.).   Medical History: Past Medical History:  Diagnosis Date   Bipolar affective (HCC)    Breast cancer (HCC)    Cancer (HCC) 12/11/2014   INVASIVE MAMMARY CARCINOMA/.left breast/ T2 N0   Cardiomegaly    Depression    Emphysema of lung (HCC)    Endometriosis    Hyperlipidemia    Osteoporosis    Schizoaffective disorder (HCC)    Schizophrenia (HCC)    Severe sepsis (HCC) 04/02/2022   Thyroid  nodule    Vitamin D  deficiency    Assessment: 73 y/o female presenting with altered mental status and hypoxia. PMH significant for bipolar disorder, breast cancer, anxiety, depression, hypothyroidism. CT imaging shows pulmonary embolism. Pharmacy has been consulted to initiate and manage heparin infusion. Per chart review, patient is not on anticoagulation prior to admission.  Baseline labs: hgb 12.2, plt 474, INR pending  Goal of Therapy:  Heparin level 0.3-0.7 units/ml Monitor platelets by anticoagulation protocol: Yes   Plan:  Give 2900 units bolus x 1 Start heparin infusion at 700 units/hr Check anti-Xa level in 8 hours and daily while on heparin Continue to monitor H&H and platelets  Thank you for involving pharmacy in this patient's care.   Ananias Balls, PharmD Clinical Pharmacist 09/28/2023 4:55 PM

## 2023-09-28 NOTE — ED Notes (Signed)
 Patient transported to CT

## 2023-09-28 NOTE — ED Provider Notes (Signed)
-----------------------------------------   3:10 PM on 09/28/2023 -----------------------------------------  Blood pressure 117/79, pulse (!) 103, temperature 98.3 F (36.8 C), temperature source Oral, resp. rate 20, SpO2 96%.  Assuming care from Dr. Peggi Bowels.  In short, Selena Pittman is a 73 y.o. female with a chief complaint of Hypotension .  Refer to the original H&P for additional details.  The current plan of care is to follow-up CT imaging of head, chest, and abdomen.  ----------------------------------------- 5:01 PM on 09/28/2023 ----------------------------------------- CT head is negative for acute process, CT of abdomen/pelvis shows evidence of colitis.  CTA chest shows significant right-sided pneumonia as well as small volume pulmonary embolism, no evidence of right heart strain noted by CT imaging.  Patient does have mildly elevated troponin, but given small volume PE would favor this being related to sepsis from pneumonia rather than PE.  Patient started on IV heparin and has already received broad-spectrum antibiotics.  Case discussed with hospitalist for admission.       Twilla Galea, MD 09/28/23 [phone]

## 2023-09-28 NOTE — Sepsis Progress Note (Signed)
 eLink is following this Code Sepsis.

## 2023-09-28 NOTE — Progress Notes (Signed)
 CODE SEPSIS - PHARMACY COMMUNICATION  **Broad Spectrum Antibiotics should be administered within 1 hour of Sepsis diagnosis**  Time Code Sepsis Called/Page Received: 1126  Antibiotics Ordered: Cefepime , vancomycin , metronidazole  Time of 1st antibiotic administration: 1156  Additional action taken by pharmacy: N/A  [name] 09/28/2023  12:06 PM

## 2023-09-29 ENCOUNTER — Telehealth (HOSPITAL_COMMUNITY): Payer: Self-pay | Admitting: Pharmacy Technician

## 2023-09-29 ENCOUNTER — Inpatient Hospital Stay
Admit: 2023-09-29 | Discharge: 2023-09-29 | Disposition: A | Discharge status: Home / Self Care | Attending: Internal Medicine | Admitting: Internal Medicine

## 2023-09-29 ENCOUNTER — Inpatient Hospital Stay

## 2023-09-29 ENCOUNTER — Other Ambulatory Visit (HOSPITAL_COMMUNITY): Payer: Self-pay

## 2023-09-29 DIAGNOSIS — E876 Hypokalemia: Secondary | ICD-10-CM

## 2023-09-29 DIAGNOSIS — R449 Unspecified symptoms and signs involving general sensations and perceptions: Secondary | ICD-10-CM

## 2023-09-29 DIAGNOSIS — Z853 Personal history of malignant neoplasm of breast: Secondary | ICD-10-CM

## 2023-09-29 DIAGNOSIS — A4101 Sepsis due to Methicillin susceptible Staphylococcus aureus: Secondary | ICD-10-CM | POA: Diagnosis not present

## 2023-09-29 DIAGNOSIS — R8281 Pyuria: Secondary | ICD-10-CM

## 2023-09-29 DIAGNOSIS — R531 Weakness: Secondary | ICD-10-CM

## 2023-09-29 DIAGNOSIS — R918 Other nonspecific abnormal finding of lung field: Secondary | ICD-10-CM | POA: Diagnosis not present

## 2023-09-29 DIAGNOSIS — L8990 Pressure ulcer of unspecified site, unspecified stage: Secondary | ICD-10-CM | POA: Insufficient documentation

## 2023-09-29 DIAGNOSIS — I2699 Other pulmonary embolism without acute cor pulmonale: Secondary | ICD-10-CM

## 2023-09-29 DIAGNOSIS — J189 Pneumonia, unspecified organism: Secondary | ICD-10-CM

## 2023-09-29 DIAGNOSIS — E43 Unspecified severe protein-calorie malnutrition: Secondary | ICD-10-CM

## 2023-09-29 LAB — BASIC METABOLIC PANEL WITH GFR
Anion gap: 8 (ref 5–15)
BUN: 6 mg/dL — ABNORMAL LOW (ref 8–23)
CO2: 22 mmol/L (ref 22–32)
Calcium: 6.9 mg/dL — ABNORMAL LOW (ref 8.9–10.3)
Chloride: 106 mmol/L (ref 98–111)
Creatinine, Ser: <0.3 mg/dL — ABNORMAL LOW (ref 0.44–1.00)
Glucose, Bld: 88 mg/dL (ref 70–99)
Potassium: 4.6 mmol/L (ref 3.5–5.1)
Sodium: 136 mmol/L (ref 135–145)

## 2023-09-29 LAB — ECHOCARDIOGRAM COMPLETE
Height: 65 in
S' Lateral: 2.4 cm
Weight: 1456 [oz_av]

## 2023-09-29 LAB — CBC
HCT: 31.8 % — ABNORMAL LOW (ref 36.0–46.0)
HCT: 33.2 % — ABNORMAL LOW (ref 36.0–46.0)
Hemoglobin: 10.6 g/dL — ABNORMAL LOW (ref 12.0–15.0)
Hemoglobin: 10.9 g/dL — ABNORMAL LOW (ref 12.0–15.0)
MCH: 31.3 pg (ref 26.0–34.0)
MCH: 31.3 pg (ref 26.0–34.0)
MCHC: 33.3 g/dL (ref 30.0–36.0)
MCHC: 32.8 g/dL (ref 30.0–36.0)
MCV: 93.8 fL (ref 80.0–100.0)
MCV: 95.4 fL (ref 80.0–100.0)
Platelets: 413 10*3/uL — ABNORMAL HIGH (ref 150–400)
Platelets: 411 10*3/uL — ABNORMAL HIGH (ref 150–400)
RBC: 3.39 MIL/uL — ABNORMAL LOW (ref 3.87–5.11)
RBC: 3.48 MIL/uL — ABNORMAL LOW (ref 3.87–5.11)
RDW: 16.8 % — ABNORMAL HIGH (ref 11.5–15.5)
RDW: 17.1 % — ABNORMAL HIGH (ref 11.5–15.5)
WBC: 8.8 10*3/uL (ref 4.0–10.5)
WBC: 10.4 10*3/uL (ref 4.0–10.5)
nRBC: 0 % (ref 0.0–0.2)
nRBC: 0 % (ref 0.0–0.2)

## 2023-09-29 LAB — HEPARIN LEVEL (UNFRACTIONATED): Heparin Unfractionated: 0.3 [IU]/mL (ref 0.30–0.70)

## 2023-09-29 LAB — MRSA NEXT GEN BY PCR, NASAL: MRSA by PCR Next Gen: DETECTED — AB

## 2023-09-29 MED ORDER — RISAQUAD PO CAPS
2.0000 | ORAL_CAPSULE | Freq: Three times a day (TID) | ORAL | Status: DC
  Administered 2023-09-29 – 2023-10-04 (×13): 2 via ORAL
  Filled 2023-09-29 (×16): qty 2

## 2023-09-29 MED ORDER — HEPARIN (PORCINE) 25000 UT/250ML-% IV SOLN
750.0000 [IU]/h | INTRAVENOUS | Status: DC
  Administered 2023-09-30: 750 [IU]/h via INTRAVENOUS
  Filled 2023-09-29: qty 250

## 2023-09-29 MED ORDER — CLOZAPINE 100 MG PO TABS
200.0000 mg | ORAL_TABLET | Freq: Every day | ORAL | Status: DC
  Administered 2023-09-29 – 2023-10-03 (×5): 200 mg via ORAL
  Filled 2023-09-29 (×5): qty 2

## 2023-09-29 MED ORDER — RIVAROXABAN 20 MG PO TABS: 20.0000 mg | ORAL_TABLET | Freq: Every day | ORAL | Status: DC

## 2023-09-29 MED ORDER — MONTELUKAST SODIUM 5 MG PO CHEW
5.0000 mg | CHEWABLE_TABLET | Freq: Every day | ORAL | Status: DC
  Filled 2023-09-29: qty 1

## 2023-09-29 MED ORDER — VANCOMYCIN HCL 500 MG/100ML IV SOLN
500.0000 mg | INTRAVENOUS | Status: AC
  Administered 2023-09-29 – 2023-10-02 (×4): 500 mg via INTRAVENOUS
  Filled 2023-09-29 (×4): qty 100

## 2023-09-29 MED ORDER — ADULT MULTIVITAMIN W/MINERALS CH
1.0000 | ORAL_TABLET | Freq: Every day | ORAL | Status: DC
  Administered 2023-10-01 – 2023-10-02 (×2): 1 via ORAL
  Filled 2023-09-29 (×3): qty 1

## 2023-09-29 MED ORDER — LINACLOTIDE 145 MCG PO CAPS
145.0000 ug | ORAL_CAPSULE | Freq: Every day | ORAL | Status: DC
  Administered 2023-10-01 – 2023-10-02 (×2): 145 ug via ORAL
  Filled 2023-09-29 (×3): qty 1

## 2023-09-29 MED ORDER — RIVAROXABAN 15 MG PO TABS
15.0000 mg | ORAL_TABLET | Freq: Two times a day (BID) | ORAL | Status: DC
  Administered 2023-09-29 (×2): 15 mg via ORAL
  Filled 2023-09-29 (×2): qty 1

## 2023-09-29 MED ORDER — MONTELUKAST SODIUM 10 MG PO TABS
5.0000 mg | ORAL_TABLET | Freq: Every day | ORAL | Status: DC
  Administered 2023-09-30 – 2023-10-02 (×3): 5 mg via ORAL
  Filled 2023-09-29 (×3): qty 1

## 2023-09-29 MED ORDER — THIAMINE HCL 100 MG PO TABS
100.0000 mg | ORAL_TABLET | Freq: Every day | ORAL | Status: DC
  Administered 2023-09-29 – 2023-10-02 (×3): 100 mg via ORAL
  Filled 2023-09-29 (×7): qty 1

## 2023-09-29 NOTE — Assessment & Plan Note (Addendum)
 Right lower lobe PE, lower extremity venous Doppler positive for extensive bilateral DVT, right greater than left - Vascular surgery was also consulted to see if she is a candidate for any procedure. - Patient is not a candidate for any procedure based on extremely debilitated state. - Heparin  was switched with Xarelto -need to switch back to heparin  as patient is now intubated

## 2023-09-29 NOTE — ED Notes (Signed)
 Pt transported back from MRI at this time by this nurse. Upon arrival to the room this nurse attempted to make pt more comfortable by removing old sheets from when pt was in main ED. Pt purewick and brief saturated with urine. Pericare performed by this nurse, new brief and purewick applied. Offered to put pt in a gown to which the pt responded, No, I want to keep my shirt on. I don't like the gowns here. They suck. Warm blankets provided per pt request. Denies any needs at this time. Bed in lowest position with wheels locked. Side rails up. Call light within reach.

## 2023-09-29 NOTE — Consult Note (Signed)
 Hospital Consult    Reason for Consult:  Pulmonary embolism with Bilateral lower DVT's Requesting Physician:  Dr Seleta Dakins MD  MRN #:  [id number]  History of Present Illness: This is a 73 y.o. female [name] is a 73 year old female with history of hypertension, on midodrine , GERD, insomnia, left breast cancer, anxiety, depression, hypothyroid, bipolar, who presents ED for chief concerns of hypotension and hypoxia at [facility] house.   On exam patient is resting comfortably in bed.  She did not respond to any my questioning other than to say that she was too tired.  On workup she had a CT chest for PE protocol which shows right lower lobe filling defects which were suggestive of pulmonary embolism.  She also underwent ultrasound of her bilateral lower extremities which showed extensive DVT right greater than left.  Vascular surgery was consulted to evaluate.  Past Medical History:  Diagnosis Date   Bipolar affective (HCC)    Breast cancer (HCC)    Cancer (HCC) 12/11/2014   INVASIVE MAMMARY CARCINOMA/.left breast/ T2 N0   Cardiomegaly    Depression    Emphysema of lung (HCC)    Endometriosis    Hyperlipidemia    Osteoporosis    Schizoaffective disorder (HCC)    Schizophrenia (HCC)    Severe sepsis (HCC) 04/02/2022   Thyroid  nodule    Vitamin D  deficiency     Past Surgical History:  Procedure Laterality Date   ABDOMINAL HYSTERECTOMY     BREAST BIOPSY Left 12-11-14   INVASIVE MAMMARY CARCINOMA.   BREAST LUMPECTOMY WITH SENTINEL LYMPH NODE BIOPSY Left 12/31/2014   Procedure: LEFT BREAST LUMPECTOMY WITH ULTRASOUND GUIDED NEEDLE LOCALIZATION, SENTINEL LYMPH NODE BIOPSY ;  Surgeon: Jerlean Mood, MD;  Location: [facility] ORS;  Service: General;  Laterality: Left;   DIAGNOSTIC MAMMOGRAM  12/04/2014   Done at [facility] Category 5-Left Breast   DILATION AND CURETTAGE OF UTERUS     MASTECTOMY Left 2016   SIMPLE MASTECTOMY WITH AXILLARY SENTINEL NODE BIOPSY Left 01/14/2015    Procedure: SIMPLE MASTECTOMY;  Surgeon: Jerlean Mood, MD;  Location: [facility] ORS;  Service: General;  Laterality: Left;   TENDON REPAIR Right    hand   TONSILLECTOMY     TUBAL LIGATION      Allergies  Allergen Reactions   Cat Dander    Lactose Intolerance (Gi)     Diarrhea    Mixed Ragweed    Peanut-Containing Drug Products     Prior to Admission medications   Medication Sig Start Date End Date Taking? Authorizing Provider  acidophilus (RISAQUAD) CAPS capsule Take 2 capsules by mouth 3 (three) times daily. 08/03/23  Yes Wieting, Richard, MD  alendronate  (FOSAMAX ) 70 MG tablet Take 1 tablet (70 mg total) by mouth once a week. Take with a full glass of water on an empty stomach. 11/04/22  Yes Garrison Kanner, MD  cetirizine  (ZYRTEC ) 10 MG tablet Take 1 tablet (10 mg total) by mouth daily. 11/04/22  Yes Garrison Kanner, MD  cholecalciferol  (VITAMIN D ) 1000 units tablet Take 2 tablets (2,000 Units total) by mouth daily. 11/04/22  Yes Garrison Kanner, MD  clotrimazole  (LOTRIMIN ) 1 % cream Apply bid to areas of redness 08/03/23  Yes Wieting, Richard, MD  cloZAPine  (CLOZARIL ) 100 MG tablet Take 1 tablet (100 mg total) by mouth 2 (two) times daily. 08/03/23  Yes Wieting, Richard, MD  docusate sodium  (COLACE) 100 MG capsule Take 1 capsule (100 mg total) by mouth 2 (two) times daily. 11/04/22  Yes Garrison Kanner, MD  fluvoxaMINE  (LUVOX ) 50 MG tablet Take 1 tablet (50 mg total) by mouth 2 (two) times daily. 11/04/22  Yes Garrison Kanner, MD  glycerin  adult 2 g suppository Place 1 suppository rectally as needed for constipation. 11/04/22  Yes Garrison Kanner, MD  ketoconazole  (NIZORAL ) 2 % shampoo APPLY TO AFFECTED AREA TWICE A WEEK FOR 8 WEEKS; THEN USE AS NEEDED. 11/04/22  Yes Garrison Kanner, MD  letrozole  (FEMARA ) 2.5 MG tablet Take 1 tablet (2.5 mg total) by mouth daily. 11/04/22  Yes Garrison Kanner, MD  LINZESS  145 MCG CAPS capsule Take 1 capsule (145 mcg total) by mouth daily. 11/04/22  Yes Garrison Kanner, MD  liver oil-zinc  oxide (DESITIN) 40  % ointment Apply topically as needed for irritation. 11/04/22  Yes Garrison Kanner, MD  Melatonin 5 MG CAPS Take 5 mg by mouth at bedtime.   Yes [provider]  midodrine  (PROAMATINE ) 2.5 MG tablet Take 1 tablet (2.5 mg total) by mouth 3 (three) times daily with meals. 08/03/23  Yes Wieting, Richard, MD  mirtazapine (REMERON) 7.5 MG tablet Take 7.5 mg by mouth at bedtime. 08/31/23  Yes [provider]  montelukast  (SINGULAIR ) 5 MG chewable tablet Chew 1 tablet (5 mg total) by mouth at bedtime. 11/04/22  Yes Garrison Kanner, MD  Multiple Vitamin (MULTIVITAMIN WITH MINERALS) TABS tablet Take 1 tablet by mouth daily. 08/03/23  Yes Wieting, Richard, MD  omeprazole  (PRILOSEC) 20 MG capsule Take 1 capsule (20 mg total) by mouth daily. 11/04/22  Yes Garrison Kanner, MD  ondansetron  (ZOFRAN ) 4 MG tablet Take 4 mg by mouth every 6 (six) hours as needed for nausea or vomiting. 07/22/23  Yes [provider]  polyethylene glycol (MIRALAX  / GLYCOLAX ) 17 g packet Take 17 g by mouth daily as needed for moderate constipation. 08/03/23  Yes Wieting, Richard, MD  thiamine  (VITAMIN B1) 100 MG tablet Take 1 tablet (100 mg total) by mouth daily. 08/03/23  Yes Verla Glaze, MD  acetaminophen  (TYLENOL ) 325 MG tablet Take 2 tablets (650 mg total) by mouth every 6 (six) hours as needed for mild pain, fever or headache. Patient not taking: Reported on 09/28/2023 11/04/22   Garrison Kanner, MD  coal tar-salicylic acid  2 % shampoo Use to wash scalp Tuesday, Thursday and Saturdays. Patient not taking: Reported on 09/28/2023 11/04/22   Garrison Kanner, MD  feeding supplement (ENSURE ENLIVE / ENSURE PLUS) LIQD Take 237 mLs by mouth 2 (two) times daily between meals. 08/03/23   Verla Glaze, MD  traZODone  (DESYREL ) 50 MG tablet Take 25 mg by mouth at bedtime as needed. Patient not taking: Reported on 09/28/2023 07/26/23   [provider]    Social History   Socioeconomic History   Marital status: Divorced    Spouse name: Not on  file   Number of children: Not on file   Years of education: Not on file   Highest education level: Not on file  Occupational History   Not on file  Tobacco Use   Smoking status: Every Day    Current packs/day: 1.00    Average packs/day: 1 pack/day for 45.0 years (45.0 ttl pk-yrs)    Types: Cigarettes   Smokeless tobacco: Never   Tobacco comments:    5 cig daily- 06/15/2022  Vaping Use   Vaping status: Never Used  Substance and Sexual Activity   Alcohol use: No    Alcohol/week: 0.0 standard drinks of alcohol   Drug use: No   Sexual activity: Not Currently  Other Topics Concern   Not on file  Social History Narrative   Not on file   Social Drivers of Health   Financial Resource Strain: Not on file  Food Insecurity: No Food Insecurity (10/29/2022)   Hunger Vital Sign    Worried About Running Out of Food in the Last Year: Never true    Ran Out of Food in the Last Year: Never true  Transportation Needs: No Transportation Needs (10/29/2022)   PRAPARE - Administrator, Civil Service (Medical): No    Lack of Transportation (Non-Medical): No  Physical Activity: Not on file  Stress: Not on file  Social Connections: Not on file  Intimate Partner Violence: Not At Risk (10/29/2022)   Humiliation, Afraid, Rape, and Kick questionnaire    Fear of Current or Ex-Partner: No    Emotionally Abused: No    Physically Abused: No    Sexually Abused: No     Family History  Problem Relation Age of Onset   Cancer Mother        uterine   Heart attack Father    Breast cancer Neg Hx     ROS: Otherwise negative unless mentioned in HPI  Physical Examination  Vitals:   09/29/23 1300 09/29/23 1427  BP: 115/68 122/80  Pulse: 97 (!) 102  Resp: 19 18  Temp:    SpO2: 98% 98%   Body mass index is 15.14 kg/m.  General:  WDWN in NAD Gait: Not observed HENT: WNL, normocephalic Pulmonary: normal non-labored breathing, without Rales, rhonchi,  wheezing Cardiac: regular,  without  Murmurs, rubs or gallops; without carotid bruits Abdomen: Positive bowel sounds throughout, soft, NT/ND, no masses Skin: without rashes Vascular Exam/Pulses: Palpable pulses throughout. All extremities are warm to touch. Extremities: without ischemic changes, without Gangrene , without cellulitis; without open wounds;  Musculoskeletal: no muscle wasting or atrophy  Neurologic: A&O X 3;  No focal weakness or paresthesias are detected; speech is fluent/normal Psychiatric:  The pt has Abnormal- Flat affect. Lymph:  Unremarkable  CBC    Component Value Date/Time   WBC 10.4 09/29/2023 0501   RBC 3.48 (L) 09/29/2023 0501   HGB 10.9 (L) 09/29/2023 0501   HCT 33.2 (L) 09/29/2023 0501   PLT 411 (H) 09/29/2023 0501   MCV 95.4 09/29/2023 0501   MCH 31.3 09/29/2023 0501   MCHC 32.8 09/29/2023 0501   RDW 17.1 (H) 09/29/2023 0501   LYMPHSABS 1.1 09/28/2023 1132   MONOABS 0.5 09/28/2023 1132   EOSABS 0.0 09/28/2023 1132   BASOSABS 0.0 09/28/2023 1132    BMET    Component Value Date/Time   NA 136 09/29/2023 0501   K 4.6 09/29/2023 0501   K 4.0 12/31/2014 1047   CL 106 09/29/2023 0501   CO2 22 09/29/2023 0501   GLUCOSE 88 09/29/2023 0501   BUN 6 (L) 09/29/2023 0501   CREATININE <0.30 (L) 09/29/2023 0501   CALCIUM  6.9 (L) 09/29/2023 0501   GFRNONAA NOT CALCULATED 09/29/2023 0501   GFRAA >60 08/06/2015 0855    COAGS: Lab Results  Component Value Date   INR 1.2 09/28/2023   INR 1.3 (H) 07/28/2023     Non-Invasive Vascular Imaging:   EXAM: CT ANGIOGRAPHY CHEST WITH CONTRAST   TECHNIQUE: Multidetector CT imaging of the chest was performed using the standard protocol during bolus administration of intravenous contrast. Multiplanar CT image reconstructions and MIPs were obtained to evaluate the vascular anatomy.   RADIATION DOSE REDUCTION: This exam was performed according to  the departmental dose-optimization program which includes automated exposure control,  adjustment of the mA and/or kV according to patient size and/or use of iterative reconstruction technique.   CONTRAST:  75mL OMNIPAQUE  IOHEXOL  350 MG/ML SOLN   COMPARISON:  CT angio chest October 29, 2022   FINDINGS: Cardiovascular: Small intraluminal filling defects in the peripheral branches of the right lower lobe pulmonary artery with a small intraluminal filling defects within the left distal interlobar artery extending into the lower lobe segments consistent with pulmonary embolism without evidence of ventricular dysfunction. Ventricular ratio of 0.9.   Moderate coronary artery calcifications   Mediastinum/Nodes: No enlarged mediastinal, hilar, or axillary lymph nodes. Thyroid  gland, trachea, and esophagus demonstrate no significant findings.   Lungs/Pleura: Right lower lobe consolidating pneumonia with ill-defined left lower lobe infiltrates and atelectasis as well as ill-defined patchy nodular infiltrates of the upper lobes, right worse than left and atelectatic changes of the right upper lobe anterior segment and right middle lobe. A nodular parenchymal density in the peripheral portion of the lateral segment of the left lower lobe image number 85 measures 1.6 x 1.2 cm and may represent inflammatory infiltrates and close follow-up CT recommended 4-6 weeks.   Upper Abdomen: No acute abnormality.   Musculoskeletal: No chest wall abnormality. No acute or significant osseous findings.   Review of the MIP images confirms the above findings.   IMPRESSION: Pulmonary embolism as described.   Pulmonary infiltrates correlate with pneumonia right worse than left.   1.6 x 1.2 cm left lower lobe parenchymal nodular density may represent pulmonary infiltrates or atelectasis.  EXAM: Bilateral LOWER EXTREMITY VENOUS DOPPLER ULTRASOUND   TECHNIQUE: Gray-scale sonography with compression, as well as color and duplex ultrasound, were performed to evaluate the deep venous  system(s) from the level of the common femoral vein through the popliteal and proximal calf veins.   COMPARISON:  CT 09/28/2023.   FINDINGS: VENOUS   There is extensive thrombus identified in both lower extremities with hypoechoic material diffusely and areas of poor flow. This includes the entire right lower extremity including common femoral, femoral, right-sided popliteal and right-sided calf vessels.   OTHER   Known history of a pulmonary embolism.   Limitations: none   IMPRESSION: Extensive bilateral lower extremity DVT. Right greater than left. Known pulmonary embolism.  Statin:  No. Beta Blocker:  No. Aspirin:  No. ACEI:  No. ARB:  No. CCB use:  No Other antiplatelets/anticoagulants:  No.    ASSESSMENT/PLAN: This is a 73 y.o. female who presents to [facility] emergency department for chief concerns of hypotension with hypoxia while living at [facility] house.  She underwent a complete workup with a CTA of the chest for PE protocol which shows very small filling defect in the right pulmonary artery to the lower lobe as well as extensive DVTs bilaterally to the lower extremities.  Upon examination patient was unable to answer questions appropriately.At this time she appears to be very frail, cachectic and malnourished.  After reviewing the CAT scan with Dr. Mikki Alexander MD patient has a very small pulmonary embolism to the right lower lobe that does not create any elevation in RV to LV ratio or heart strain.  She does have bilateral lower extremity DVTs but do not appear to be making her uncomfortable or painful.  Her bilateral lower extremities are not swollen, red, hot or painful.  Therefore vascular surgery recommends anticoagulation only at this time.  We do not feel that she would benefit from a pulmonary thrombectomy or  bilateral lower thrombectomies for her DVTs as she is no longer ambulatory and bedbound.  There was a question of IVC filter placement but due to the fact that  she can be anticoagulated safely IVC placement is not recommended at this time either.  Continue heparin  infusion today and convert to oral anticoagulation afterwards.  Continue to monitor hemoglobin and hematocrits accordingly to make sure she is safe.  Due to the patient's extremely frail condition at this time we will not can recommend follow-up in our office as it would be very difficult on the patient.   -I discussed the case in detail with Dr. Mikki Alexander MD and he agrees with plan.   Selena Pittman [facility] 09/29/2023 4:26 PM

## 2023-09-29 NOTE — Progress Notes (Signed)
*  PRELIMINARY RESULTS* Echocardiogram 2D Echocardiogram has been performed.  Selena Pittman 09/29/2023, 10:42 AM

## 2023-09-29 NOTE — Progress Notes (Signed)
 PHARMACY - ANTICOAGULATION CONSULT NOTE  Pharmacy Consult for heparin Indication: pulmonary embolus  Allergies  Allergen Reactions   Cat Dander    Lactose Intolerance (Gi)     Diarrhea    Mixed Ragweed    Peanut-Containing Drug Products    Patient Measurements: Height: 5' 5 (165.1 cm) Weight: 41.3 kg (91 lb) IBW/kg (Calculated) : 57 HEPARIN DW (KG): 41.3  Vital Signs: Temp: 98.1 F (36.7 C) (06/11 0037) Temp Source: Oral (06/11 0037) BP: 109/71 (06/11 0030) Pulse Rate: 95 (06/11 0030)  Labs: Recent Labs    09/28/23 1132 09/28/23 1248 09/29/23 0145  HGB 12.2  --  10.6*  HCT 36.0  --  31.8*  PLT 474*  --  413*  LABPROT  --  15.5*  --   INR  --  1.2  --   HEPARINUNFRC  --   --  0.30  CREATININE  --  0.35*  --   TROPONINIHS 30* 49*  --     Estimated Creatinine Clearance: 42.1 mL/min (A) (by C-G formula based on SCr of 0.35 mg/dL (L)).   Medical History: Past Medical History:  Diagnosis Date   Bipolar affective (HCC)    Breast cancer (HCC)    Cancer (HCC) 12/11/2014   INVASIVE MAMMARY CARCINOMA/.left breast/ T2 N0   Cardiomegaly    Depression    Emphysema of lung (HCC)    Endometriosis    Hyperlipidemia    Osteoporosis    Schizoaffective disorder (HCC)    Schizophrenia (HCC)    Severe sepsis (HCC) 04/02/2022   Thyroid  nodule    Vitamin D  deficiency    Assessment: 73 y/o female presenting with altered mental status and hypoxia. PMH significant for bipolar disorder, breast cancer, anxiety, depression, hypothyroidism. CT imaging shows pulmonary embolism. Pharmacy has been consulted to initiate and manage heparin infusion. Per chart review, patient is not on anticoagulation prior to admission.  Baseline labs: hgb 12.2, plt 474, INR pending  Goal of Therapy:  Heparin level 0.3-0.7 units/ml Monitor platelets by anticoagulation protocol: Yes  06/11 0145 HL 0.30, barely therapeutic   Plan:  Increase heparin infusion to 750 units/hr Recheck HL in 8 hrs  after rate change CBC daily while on heparin  Thank you for involving pharmacy in this patient's care.   Coretta Dexter, PharmD, [facility] 09/29/2023 2:45 AM

## 2023-09-29 NOTE — Progress Notes (Addendum)
 Progress Note   Patient: Selena Pittman UJW:[id number] DOB: [birth year] DOA: 09/28/2023     1 DOS: the patient was seen and examined on 09/29/2023   Brief hospital course: Ms. Toinette Lackie is a 73 year old female with history of hypertension, on midodrine , GERD, insomnia, left breast cancer, anxiety, depression, hypothyroid, bipolar, who presents ED for chief concerns of hypotension and hypoxia at [facility] house.  Vitals in the ED showed t 98.3, respiration rate of 18, heart rate 112, blood pressure 83/64, improved to 99/67, SpO2 of 92% on 2 L nasal cannula.  Serum sodium is 139, potassium 3.0, chloride one 0.6, bicarb 23, BUN of 7, serum creatinine 0.35, EGFR greater than 60, nonfasting blood glucose 99, WBC 11.6, hemoglobin 12.2, platelets of 474.HS troponin is 38, on repeat is 49. COVID/influenza A/influenza B/RSV PCR were negative.Lactic acid is 1.3. Chest x-ray with concern of atypical pneumonia. CTA chest with small bilateral PE and bilateral infiltrate concerning for pneumonia CT head with no acute abnormality, age-related atrophy and moderate paranasal sinus disease.  CT abdomen and pelvis with concern of enterocolitis and fecal impaction in rectum.  ED treatment: Heparin bolus and gtt., vancomycin , cefepime , sodium chloride  1.5 liter bolus, midodrine  2.5 mg p.o. one-time dose.  6/11: Vital stable, preliminary blood cultures negative.  MRI brain with no acute intracranial abnormality.  Mucosal thickening with air-fluid level in left sphenoid and maxillary sinuses more consistent with acute sinusitis. MRSA PCR positive so adding vancomycin . Lower extremity venous Doppler with extensive bilateral DVT, right greater than left.  Echocardiogram was normal. Vascular surgery was consulted to see if she is a candidate for any intervention or IVC filter placement.  Assessment and Plan: * Sepsis due to pneumonia [facility]) Patient met sepsis criteria with leukocytosis, tachycardia and  tachypnea.  No endorgan damage. Mild hypoxia with on admission but she also had PE. Concern of bilateral pneumonia.  MRSA PCR positive Imaging also concerning for sinusitis, UA concern of UTI Preliminary blood cultures negative, urine and sputum cultures pending. - Continue with ceftriaxone  and Zithromax  -Add vancomycin  -Continue with supportive care  Pyuria Present on admission Continue treatment ceftriaxone  for pneumonia Urine cultures pending  Multilobar lung infiltrate R > L, MRSA PCR positive -Management as above  Acute pulmonary embolism (HCC) Right lower lobe PE, lower extremity venous Doppler positive for extensive bilateral DVT, right greater than left - Vascular surgery was also consulted to see if she is a candidate for any procedure. - Continue with heparin infusion-we will place her on Xarelto after vascular procedure  Right-sided sensory deficit present Patient does not know how long this has been going on Per son, patient is right handed CT head and MRI brain  negative for any acute abnormality  Hypokalemia Resolved with repletion.  Magnesium  was low normal at 1.7 -Continue to monitor and replete as needed  Protein-calorie malnutrition, severe (HCC) Estimated body mass index is 15.14 kg/m as calculated from the following:   Height as of this encounter: 5' 5 (1.651 m).   Weight as of this encounter: 41.3 kg.   - Dietitian consult      Subjective: Patient was not feeling well when seen today.  Continues to have cough, nasal congestion and generalized aches and pains.  Physical Exam: Vitals:   09/29/23 1100 09/29/23 1123 09/29/23 1300 09/29/23 1427  BP: 107/70  115/68 122/80  Pulse: 95  97 (!) 102  Resp: (!) 22  19 18   Temp:  97.7 F (36.5 C)    TempSrc:  SpO2: 97%  98% 98%  Weight:      Height:       General.  Severely malnourished elderly lady, in no acute distress. Pulmonary.  Few scattered rhonchi bilaterally, normal respiratory  effort. CV.  Regular rate and rhythm, no JVD, rub or murmur. Abdomen.  Soft, nontender, nondistended, BS positive. CNS.  Alert and oriented .  No focal neurologic deficit. Extremities.  No edema, pulses intact and symmetrical.  Data Reviewed: Prior data reviewed  Family Communication: Talked with son on phone.  Disposition: Status is: Inpatient Remains inpatient appropriate because: Severity of illness  Planned Discharge Destination: Skilled nursing facility  DVT prophylaxis.  Xarelto Time spent: 50 minutes  This record has been created using Conservation officer, historic buildings. Errors have been sought and corrected,but may not always be located. Such creation errors do not reflect on the standard of care.   Author: Luna Salinas, MD 09/29/2023 4:41 PM  For on call review [email or web address].

## 2023-09-29 NOTE — Progress Notes (Signed)
 PHARMACY - ANTICOAGULATION CONSULT NOTE  Pharmacy Consult for heparin Indication: pulmonary embolus  Allergies  Allergen Reactions   Cat Dander    Lactose Intolerance (Gi)     Diarrhea    Mixed Ragweed    Peanut-Containing Drug Products    Patient Measurements: Height: 5' 5 (165.1 cm) Weight: 41.3 kg (91 lb) IBW/kg (Calculated) : 57 HEPARIN DW (KG): 41.3  Vital Signs: Temp: 97.7 F (36.5 C) (06/11 1123) BP: 122/80 (06/11 1427) Pulse Rate: 102 (06/11 1427)  Labs: Recent Labs    09/28/23 1132 09/28/23 1248 09/29/23 0145 09/29/23 0501  HGB 12.2  --  10.6* 10.9*  HCT 36.0  --  31.8* 33.2*  PLT 474*  --  413* 411*  LABPROT  --  15.5*  --   --   INR  --  1.2  --   --   HEPARINUNFRC  --   --  0.30  --   CREATININE  --  0.35*  --  <0.30*  TROPONINIHS 30* 49*  --   --     CrCl cannot be calculated (This lab value cannot be used to calculate CrCl because it is not a number: <0.30).   Medical History: Past Medical History:  Diagnosis Date   Bipolar affective (HCC)    Breast cancer (HCC)    Cancer (HCC) 12/11/2014   INVASIVE MAMMARY CARCINOMA/.left breast/ T2 N0   Cardiomegaly    Depression    Emphysema of lung (HCC)    Endometriosis    Hyperlipidemia    Osteoporosis    Schizoaffective disorder (HCC)    Schizophrenia (HCC)    Severe sepsis (HCC) 04/02/2022   Thyroid  nodule    Vitamin D  deficiency    Assessment: 73 y/o female presenting with altered mental status and hypoxia. PMH significant for bipolar disorder, breast cancer, anxiety, depression, hypothyroidism. CT imaging shows pulmonary embolism.Per chart review, patient is not on anticoagulation prior to admission. Pharmacy has been consulted to re- initiate and manage heparin infusion.  Patient started Xarelto today 6/11 (last dose@1625 ). Now transition back to heparin drip.   Goal of Therapy:  Heparin level 0.3-0.7 units/ml Monitor platelets by anticoagulation protocol: Yes    Plan:  Will begin  heparin infusion at 750 units/hr at timing that next Xarelto dose would be due (6/12 @0800 ) -ordered baseline aPTT and heparin level with am labs - f/u aPTT 8 hrs after drip started -assess aPTT and HL for correlation CBC daily while on heparin  Thank you for involving pharmacy in this patient's care.   Thomasine Flick PharmD Clinical Pharmacist 09/29/2023

## 2023-09-29 NOTE — Telephone Encounter (Signed)
 Patient Product/process development scientist completed.    The patient is insured through [employer]. Patient has Medicare and is not eligible for a copay card, but may be able to apply for patient assistance or Medicare RX Payment Plan (Patient Must reach out to their plan, if eligible for payment plan), if available.    Ran test claim for Eliquis 5 mg and the current 30 day co-pay is $4.80.  Ran test claim for Xarelto 20 mg and the current 30 day co-pay is $4.80.  This test claim was processed through [facility]- copay amounts may vary at other pharmacies due to pharmacy/plan contracts, or as the patient moves through the different stages of their insurance plan.     Roland Earl, CPHT Pharmacy Technician III Certified Patient Advocate [facility] Pharmacy Patient Advocate Team Direct Number: [phone]  Fax: [phone]

## 2023-09-29 NOTE — Assessment & Plan Note (Signed)
 Estimated body mass index is 15.14 kg/m as calculated from the following:   Height as of this encounter: 5' 5 (1.651 m).   Weight as of this encounter: 41.3 kg.   - Dietitian consult

## 2023-09-29 NOTE — Consult Note (Addendum)
 Pharmacy Antibiotic Note  Selena Pittman is a 73 y.o. female admitted on 09/28/2023 with pneumonia.  Pharmacy has been consulted for vancomycin  dosing. MRSA PCR is positive. afeb, WBC 11 > 10, Chest CT: Pulmonary infiltrates correlate with pneumonia.   Plan: Will give vancomycin  1000 mg x 1 followed by 500 mg q24H. Predicted AUC of  427. Goal AUC of 400-550. Plan to obtain vancomycin  level after the 4th dose. Scr 0.8, TBW, Vd 0.72.   Continue ceftriaxone  2 g q24H + azithromycin  500 mg q24H.   Height: 5' 5 (165.1 cm) Weight: 41.3 kg (91 lb) IBW/kg (Calculated) : 57  Temp (24hrs), Avg:98 F (36.7 C), Min:97.7 F (36.5 C), Max:98.3 F (36.8 C)  Recent Labs  Lab 09/28/23 1132 09/28/23 1248 09/29/23 0145 09/29/23 0501  WBC 11.6*  --  8.8 10.4  CREATININE  --  0.35*  --  <0.30*  LATICACIDVEN 1.3  --   --   --     CrCl cannot be calculated (This lab value cannot be used to calculate CrCl because it is not a number: <0.30).    Allergies  Allergen Reactions   Cat Dander    Lactose Intolerance (Gi)     Diarrhea    Mixed Ragweed    Peanut-Containing Drug Products     Antimicrobials this admission: 6/10 cefepime  x 1 6/11 ceftriaxone  >>  6/11 azithromycin  >>  6/11 vancomycin  >>   Dose adjustments this admission: None  Microbiology results: 6/10 BCx: pending 6/10 UCx: pending    Thank you for allowing pharmacy to be a part of this patient's care.  Trinidad Funk, PharmD, BCPS 09/29/2023 9:52 AM

## 2023-09-29 NOTE — ED Notes (Incomplete)
 Pt transported back from MRI by this nurse. Pt still on sheets from when she was in the main ED. Pt purewick and brief saturated with urine. Old sheets removed, pericare provided, and new

## 2023-09-29 NOTE — ED Notes (Signed)
 This tech assisted pt. in eating at this time. The pt. managed to eat a bite of spaghetti and a few bites of broccoli at this time.

## 2023-09-29 NOTE — Consult Note (Addendum)
 PHARMACY - ANTICOAGULATION CONSULT NOTE  Pharmacy Consult for rivaroxaban Indication: pulmonary embolus  Allergies  Allergen Reactions   Cat Dander    Lactose Intolerance (Gi)     Diarrhea    Mixed Ragweed    Peanut-Containing Drug Products     Patient Measurements: Height: 5' 5 (165.1 cm) Weight: 41.3 kg (91 lb) IBW/kg (Calculated) : 57 HEPARIN DW (KG): 41.3  Vital Signs: Temp: 97.7 F (36.5 C) (06/11 0800) Temp Source: Oral (06/11 0037) BP: 108/78 (06/11 0800) Pulse Rate: 96 (06/11 0800)  Labs: Recent Labs    09/28/23 1132 09/28/23 1248 09/29/23 0145 09/29/23 0501  HGB 12.2  --  10.6* 10.9*  HCT 36.0  --  31.8* 33.2*  PLT 474*  --  413* 411*  LABPROT  --  15.5*  --   --   INR  --  1.2  --   --   HEPARINUNFRC  --   --  0.30  --   CREATININE  --  0.35*  --  <0.30*  TROPONINIHS 30* 49*  --   --     CrCl cannot be calculated (This lab value cannot be used to calculate CrCl because it is not a number: <0.30).   Medical History: Past Medical History:  Diagnosis Date   Bipolar affective (HCC)    Breast cancer (HCC)    Cancer (HCC) 12/11/2014   INVASIVE MAMMARY CARCINOMA/.left breast/ T2 N0   Cardiomegaly    Depression    Emphysema of lung (HCC)    Endometriosis    Hyperlipidemia    Osteoporosis    Schizoaffective disorder (HCC)    Schizophrenia (HCC)    Severe sepsis (HCC) 04/02/2022   Thyroid  nodule    Vitamin D  deficiency     Medications:  (Not in a hospital admission)  Scheduled:   cloZAPine   200 mg Oral QHS   melatonin  5 mg Oral QHS   midodrine   2.5 mg Oral TID WC   pantoprazole   40 mg Oral Daily   Rivaroxaban  15 mg Oral BID WC   Followed by   Cecily Cohen ON 10/20/2023] rivaroxaban  20 mg Oral Q supper   Infusions:   azithromycin  Stopped (09/28/23 1915)   cefTRIAXone  (ROCEPHIN )  IV     PRN: acetaminophen  **OR** acetaminophen , bisacodyl , ondansetron  **OR** ondansetron  (ZOFRAN ) IV, senna-docusate, traZODone Anti-infectives (From admission,  onward)    Start     Dose/Rate Route Frequency Ordered Stop   09/29/23 1700  cefTRIAXone  (ROCEPHIN ) 2 g in sodium chloride  0.9 % 100 mL IVPB        2 g 200 mL/hr over 30 Minutes Intravenous Every 24 hours 09/28/23 1703 10/03/23 1659   09/28/23 1715  azithromycin  (ZITHROMAX ) 500 mg in sodium chloride  0.9 % 250 mL IVPB        500 mg 250 mL/hr over 60 Minutes Intravenous Every 24 hours 09/28/23 1703 10/03/23 1714   09/28/23 1200  ceFEPIme  (MAXIPIME ) 2 g in sodium chloride  0.9 % 100 mL IVPB        2 g 200 mL/hr over 30 Minutes Intravenous  Once 09/28/23 1148 09/28/23 1226   09/28/23 1200  metroNIDAZOLE (FLAGYL) IVPB 500 mg        500 mg 100 mL/hr over 60 Minutes Intravenous  Once 09/28/23 1148 09/28/23 1354   09/28/23 1200  vancomycin  (VANCOCIN ) IVPB 1000 mg/200 mL premix        1,000 mg 200 mL/hr over 60 Minutes Intravenous  Once 09/28/23 1148 09/28/23 1455  Assessment: Pharmacy consulted to start rivaroxaban for PE.   Labs: SCr < 0.3 mg/dL, CrCl Can not calculate but most likely > 15 mL/min (use TBW for calculation), Hgb 10.9, Plt 411 Duplication: pt was on heparin, but stopped with start of rivaroxaban.  Drug-drug Interactions: No major DDI. Fluvoxamine  possibly can increase risk of bleeding.  Procedure Planned: None Bleeding Assessment: Hgb is trending down. Will continue to monitor. Most likely hemodilutional with fluid bolus given 6/10.  Liver disease/Ascites?  []  Yes, If so, calculate the Child Pugh Score (if Class B for worse recommend alternative)  [x] No    Goal of Therapy:   Monitor platelets by anticoagulation protocol: Yes   Plan:  Rivaroxaban 15 mg BID followed by rivaroxaban 20 mg daily.   Trinidad Funk, PharmD, BCPS 09/29/2023,9:59 AM

## 2023-09-29 NOTE — ED Notes (Signed)
 Lab called reporting positive MRSA nasal swab. Brion Cancel, MD notified.

## 2023-09-30 DIAGNOSIS — A4101 Sepsis due to Methicillin susceptible Staphylococcus aureus: Secondary | ICD-10-CM | POA: Diagnosis not present

## 2023-09-30 DIAGNOSIS — R8281 Pyuria: Secondary | ICD-10-CM | POA: Diagnosis not present

## 2023-09-30 DIAGNOSIS — R531 Weakness: Secondary | ICD-10-CM | POA: Diagnosis not present

## 2023-09-30 DIAGNOSIS — J189 Pneumonia, unspecified organism: Secondary | ICD-10-CM | POA: Diagnosis not present

## 2023-09-30 DIAGNOSIS — R918 Other nonspecific abnormal finding of lung field: Secondary | ICD-10-CM | POA: Diagnosis not present

## 2023-09-30 LAB — BASIC METABOLIC PANEL WITH GFR
Anion gap: 4 — ABNORMAL LOW (ref 5–15)
BUN: <5 mg/dL — ABNORMAL LOW (ref 8–23)
CO2: 26 mmol/L (ref 22–32)
Calcium: 6.8 mg/dL — ABNORMAL LOW (ref 8.9–10.3)
Chloride: 106 mmol/L (ref 98–111)
Creatinine, Ser: <0.3 mg/dL — ABNORMAL LOW (ref 0.44–1.00)
Glucose, Bld: 85 mg/dL (ref 70–99)
Potassium: 3.2 mmol/L — ABNORMAL LOW (ref 3.5–5.1)
Sodium: 136 mmol/L (ref 135–145)

## 2023-09-30 LAB — MAGNESIUM: Magnesium: 1.6 mg/dL — ABNORMAL LOW (ref 1.7–2.4)

## 2023-09-30 LAB — RESPIRATORY PANEL BY PCR

## 2023-09-30 LAB — CBC
HCT: 30.3 % — ABNORMAL LOW (ref 36.0–46.0)
Hemoglobin: 10.2 g/dL — ABNORMAL LOW (ref 12.0–15.0)
MCH: 30.5 pg (ref 26.0–34.0)
MCHC: 33.7 g/dL (ref 30.0–36.0)
MCV: 90.7 fL (ref 80.0–100.0)
Platelets: 366 10*3/uL (ref 150–400)
RBC: 3.34 MIL/uL — ABNORMAL LOW (ref 3.87–5.11)
RDW: 16.7 % — ABNORMAL HIGH (ref 11.5–15.5)
WBC: 4.6 10*3/uL (ref 4.0–10.5)
nRBC: 0 % (ref 0.0–0.2)

## 2023-09-30 LAB — APTT: aPTT: 41 s — ABNORMAL HIGH (ref 24–36)

## 2023-09-30 LAB — HEPARIN LEVEL (UNFRACTIONATED): Heparin Unfractionated: >1.1 [IU]/mL — ABNORMAL HIGH (ref 0.30–0.70)

## 2023-09-30 LAB — URINE CULTURE: Culture: >=100000 — AB

## 2023-09-30 LAB — PROCALCITONIN: Procalcitonin: <0.1 ng/mL

## 2023-09-30 LAB — BLOOD GAS, ARTERIAL

## 2023-09-30 MED ORDER — RIVAROXABAN 15 MG PO TABS
15.0000 mg | ORAL_TABLET | Freq: Two times a day (BID) | ORAL | Status: DC
  Administered 2023-09-30 – 2023-10-02 (×4): 15 mg via ORAL
  Filled 2023-09-30 (×5): qty 1

## 2023-09-30 MED ORDER — GUAIFENESIN ER 600 MG PO TB12
1200.0000 mg | ORAL_TABLET | Freq: Two times a day (BID) | ORAL | Status: DC
  Administered 2023-09-30 – 2023-10-02 (×6): 1200 mg via ORAL
  Filled 2023-09-30 (×6): qty 2

## 2023-09-30 MED ORDER — VITAMIN C 500 MG PO TABS
500.0000 mg | ORAL_TABLET | Freq: Two times a day (BID) | ORAL | Status: DC
  Administered 2023-09-30 – 2023-10-02 (×5): 500 mg via ORAL
  Filled 2023-09-30 (×6): qty 1

## 2023-09-30 MED ORDER — ZINC SULFATE 220 (50 ZN) MG PO CAPS
220.0000 mg | ORAL_CAPSULE | Freq: Every day | ORAL | Status: DC
  Administered 2023-10-01 – 2023-10-02 (×2): 220 mg via ORAL
  Filled 2023-09-30 (×3): qty 1

## 2023-09-30 MED ORDER — BOOST / RESOURCE BREEZE PO LIQD CUSTOM
1.0000 | Freq: Three times a day (TID) | ORAL | Status: DC
  Administered 2023-09-30 – 2023-10-01 (×2): 1 via ORAL

## 2023-09-30 MED ORDER — RIVAROXABAN 20 MG PO TABS: 20.0000 mg | ORAL_TABLET | Freq: Every day | ORAL | Status: DC

## 2023-09-30 MED ORDER — POTASSIUM CHLORIDE 20 MEQ PO PACK
40.0000 meq | PACK | Freq: Once | ORAL | Status: DC
  Filled 2023-09-30: qty 2

## 2023-09-30 MED ORDER — FOSFOMYCIN TROMETHAMINE 3 G PO PACK
3.0000 g | PACK | Freq: Once | ORAL | Status: DC
  Filled 2023-09-30: qty 3

## 2023-09-30 MED ORDER — ENSURE PLUS HIGH PROTEIN PO LIQD
237.0000 mL | Freq: Two times a day (BID) | ORAL | Status: DC
  Administered 2023-09-30: 237 mL via ORAL

## 2023-09-30 MED ORDER — AZITHROMYCIN 250 MG PO TABS
500.0000 mg | ORAL_TABLET | Freq: Every day | ORAL | Status: DC
  Administered 2023-10-01: 500 mg via ORAL
  Filled 2023-09-30: qty 2

## 2023-09-30 MED ORDER — MAGNESIUM SULFATE 4 GM/100ML IV SOLN
4.0000 g | Freq: Once | INTRAVENOUS | Status: AC
  Administered 2023-09-30: 4 g via INTRAVENOUS
  Filled 2023-09-30: qty 100

## 2023-09-30 NOTE — Progress Notes (Signed)
 Patient too sleepy and difficult to arouse, after several attempts to talk patient finally responded saying good morning and leave me alone. MD made aware of patient's status, will recheck again. MD recommended doing ABG if patient stays same.

## 2023-09-30 NOTE — TOC Initial Note (Addendum)
 Transition of Care [facility]) - Initial/Assessment Note    Patient Details  Name: Selena Pittman MRN: [id number] Date of Birth: November 16, 1950  Transition of Care [facility]) CM/SW Contact:    [name], LCSW Phone Number: 09/30/2023, 9:58 AM  Clinical Narrative:   Patient not fully oriented. No family at bedside. CSW left son a Engineer, technical sales. Per chart review, patient admitted from [facility] SNF. Admissions coordinator confirmed she is a long-term resident. No further concerns. CSW will continue to follow patient for support and facilitate return to SNF once medically stable.        10:21 am: Received call back from son. He confirmed plan to return to [facility] at discharge.         Expected Discharge Plan: Skilled Nursing Facility Barriers to Discharge: Continued Medical Work up   Patient Goals and CMS Choice            Expected Discharge Plan and Services       Living arrangements for the past 2 months: Skilled Nursing Facility                                      Prior Living Arrangements/Services Living arrangements for the past 2 months: Skilled Nursing Facility Lives with:: Facility Resident Patient language and need for interpreter reviewed:: Yes        Need for Family Participation in Patient Care: Yes (Comment) Care giver support system in place?: Yes (comment)   Criminal Activity/Legal Involvement Pertinent to Current Situation/Hospitalization: No - Comment as needed  Activities of Daily Living   ADL Screening (condition at time of admission) Independently performs ADLs?: No Is the patient deaf or have difficulty hearing?: No Does the patient have difficulty seeing, even when wearing glasses/contacts?: Yes Does the patient have difficulty concentrating, remembering, or making decisions?: No  Permission Sought/Granted                  Emotional Assessment   Attitude/Demeanor/Rapport: Unable to Assess Affect (typically  observed): Unable to Assess Orientation: : Oriented to Self, Oriented to Place Alcohol / Substance Use: Not Applicable Psych Involvement: No (comment)  Admission diagnosis:  Weakness [R53.1] Severe sepsis with acute organ dysfunction (HCC) [A41.9, R65.20] Pneumonia of right lower lobe due to infectious organism [J18.9] Acute pulmonary embolism without acute cor pulmonale, unspecified pulmonary embolism type (HCC) [I26.99] Patient Active Problem List   Diagnosis Date Noted   Pressure injury of skin 09/29/2023   Sepsis due to pneumonia (HCC) 09/28/2023   Multilobar lung infiltrate 09/28/2023   Acute pulmonary embolism (HCC) 09/28/2023   Right-sided sensory deficit present 09/28/2023   Pyuria 09/28/2023   Infection due to ESBL-producing Escherichia coli 08/02/2023   Underweight (BMI < 18.5) 08/01/2023   Constipation 07/31/2023   Acute metabolic encephalopathy 07/28/2023   Electrolyte abnormality 07/28/2023   History of breast cancer 07/28/2023   Prolonged QT interval 07/28/2023   Metabolic acidosis 07/28/2023   Protein-calorie malnutrition, severe (HCC) 07/28/2023   Septic shock (HCC) 07/27/2023   Acute distention of stomach 11/02/2022   Abdominal pain 11/01/2022   Multifocal pneumonia 10/29/2022   Bipolar disorder (HCC) 10/29/2022   Hyperlipidemia 10/29/2022   IBS (irritable colon syndrome) 10/29/2022   Generalized weakness 04/12/2022   Acute cystitis without hematuria 04/09/2022   Necrotizing pneumonia (HCC) 04/06/2022   Tobacco use 04/02/2022   GERD (gastroesophageal reflux disease) 04/02/2022   Schizophrenia (HCC) 04/02/2022  Leukocytosis 04/02/2022   Hypokalemia 04/02/2022   Lung mass 04/02/2022   Malignant neoplasm of left female breast (HCC) 06/05/2015   PCP:  Sharyne Degree, FNP Pharmacy:   [facility] - [place], [place] - [address] [place] [address] Phone: [phone] Fax: [phone]  [facility] [phone] -  [place], [place] - [address] [place] [address] Phone: [phone] Fax: [phone]  [facility] - [place], [place] - [address] [place] [address] Phone: [phone] Fax: [phone]     Social Drivers of Health (SDOH) Social History: SDOH Screenings   Food Insecurity: No Food Insecurity (10/29/2022)  Housing: Low Risk  (10/29/2022)  Transportation Needs: No Transportation Needs (10/29/2022)  Utilities: Not At Risk (10/29/2022)  Social Connections: Patient Unable To Answer (09/29/2023)  Tobacco Use: High Risk (09/28/2023)   SDOH Interventions:     Readmission Risk Interventions    07/30/2023   12:52 PM  Readmission Risk Prevention Plan  Transportation Screening Complete  HRI or Home Care Consult Complete  Medication Review (RN Care Manager) Complete

## 2023-09-30 NOTE — Progress Notes (Signed)
 Initial Nutrition Assessment  DOCUMENTATION CODES:   Underweight, Severe malnutrition in context of chronic illness  INTERVENTION:   -Liberalize diet to regular for widest variety of meal selections -MVI with minerals daily -500 mg vitamin C BID -220 mg zinc  sulfate daily x 14 days -Boost Breeze po TID, each supplement provides 250 kcal and 9 grams of protein  -Magic cup TID with meals, each supplement provides 290 kcal and 9 grams of protein  -Feeding assistance with meals -RD will draw labs and replete as necessary to identify possible micronutrient deficiencies that may impede wound healing: Vitamin A  NUTRITION DIAGNOSIS:   Severe Malnutrition related to social / environmental circumstances as evidenced by severe fat depletion, severe muscle depletion, percent weight loss.  GOAL:   Patient will meet greater than or equal to 90% of their needs  MONITOR:   PO intake, Supplement acceptance  REASON FOR ASSESSMENT:   Consult Assessment of nutrition requirement/status  ASSESSMENT:   Pt with history of hypertension, on midodrine , GERD, insomnia, left breast cancer, anxiety, depression, hypothyroid, bipolar, who presents for chief concerns of hypotension and hypoxia at [facility] house.  Pt admitted with severe sepsis with acute organ dysfunction and pyuria.  Reviewed I/O's: -250 ml x 24 hours and +1.9 L since admission  UOP: 250 ml x 24 hours  Pt with acute pulmonary embolism. Pt is on heparin and plan for vascular consult.   Pt with rt sided sensory deficits; CT of head and MRI of brain are negative for acute abnormalities.   Per ED notes, pt requires feeding assistance. She consumed a few bites of spaghetti and broccoli yesterday.   Pt lying in bed at time of visit. She reports feeling poorly today, stating she feels sick, but unable to offer further details. Pt confirms that she is from [employer]. She shares that she has had a poor appetite for a very long time;  she does not on a special diet or have difficulty chewing despite multiple missing teeth. Observed lunch tray, which was untouched, but tech entered the room after RD visit to feed pt.    Pt endorses wt loss, but unsure how much or what UBW is. Reviewed wt hx; pt has experienced 8.8% wt loss over the past 2 months, which is significant for time frame.   Per pt, she has tried Ensure in the past and does not like it. She is unwilling to drink it not now and not ever. Reviewed other supplement on formulary; pt amenable to [employer] as she likes ice cream.   Medications reviewed and include potassium chloride , melatonin, MVI, protonix , and thiamine .  Labs reviewed.    NUTRITION - FOCUSED PHYSICAL EXAM:  Flowsheet Row Most Recent Value  Orbital Region Severe depletion  Upper Arm Region Severe depletion  Thoracic and Lumbar Region Severe depletion  Buccal Region Severe depletion  Temple Region Severe depletion  Clavicle Bone Region Severe depletion  Clavicle and Acromion Bone Region Severe depletion  Scapular Bone Region Severe depletion  Dorsal Hand Severe depletion  Patellar Region Severe depletion  Anterior Thigh Region Severe depletion  Posterior Calf Region Severe depletion  Edema (RD Assessment) Mild  Hair Reviewed  Eyes Reviewed  Mouth Reviewed  Skin Reviewed  Nails Reviewed    Diet Order:   Diet Order             Diet regular Room service appropriate? Yes; Fluid consistency: Thin  Diet effective now  EDUCATION NEEDS:   Education needs have been addressed  Skin:  Skin Assessment: Skin Integrity Issues: Skin Integrity Issues:: Stage II Stage II: sacrum  Last BM:  Unknown  Height:   Ht Readings from Last 1 Encounters:  09/28/23 5' 5 (1.651 m)    Weight:   Wt Readings from Last 1 Encounters:  09/28/23 41.3 kg    Ideal Body Weight:  56.8 kg  BMI:  Body mass index is 15.14 kg/m.  Estimated Nutritional Needs:   Kcal:   1650-1850  Protein:  85-100 grams  Fluid:  1.6-1.8 L    Herschel Lords, RD, LDN, CDCES Registered Dietitian III Certified Diabetes Care and Education Specialist If unable to reach this RD, please use RD Inpatient group chat on secure chat between hours of 8am-4 pm daily

## 2023-09-30 NOTE — [place] FL2 (Signed)
 [place]  MEDICAID FL2 LEVEL OF CARE FORM     IDENTIFICATION  Patient Name: Selena Pittman Birthdate: [birth year] Sex: female Admission Date (Current Location): 09/28/2023  [facility] and [place] Number:  Chiropodist and Address:  [facility], [address], [place], [place] [address]      Provider Number: [id number]  Attending Physician Name and Address:  Luna Salinas, MD  Relative Name and Phone Number:       Current Level of Care: Hospital Recommended Level of Care: Skilled Nursing Facility Prior Approval Number:    Date Approved/Denied:   PASRR Number: [id number]  Discharge Plan: SNF    Current Diagnoses: Patient Active Problem List   Diagnosis Date Noted   Pressure injury of skin 09/29/2023   Sepsis due to pneumonia (HCC) 09/28/2023   Multilobar lung infiltrate 09/28/2023   Acute pulmonary embolism (HCC) 09/28/2023   Right-sided sensory deficit present 09/28/2023   Pyuria 09/28/2023   Infection due to ESBL-producing Escherichia coli 08/02/2023   Underweight (BMI < 18.5) 08/01/2023   Constipation 07/31/2023   Acute metabolic encephalopathy 07/28/2023   Electrolyte abnormality 07/28/2023   History of breast cancer 07/28/2023   Prolonged QT interval 07/28/2023   Metabolic acidosis 07/28/2023   Protein-calorie malnutrition, severe (HCC) 07/28/2023   Septic shock (HCC) 07/27/2023   Acute distention of stomach 11/02/2022   Abdominal pain 11/01/2022   Multifocal pneumonia 10/29/2022   Bipolar disorder (HCC) 10/29/2022   Hyperlipidemia 10/29/2022   IBS (irritable colon syndrome) 10/29/2022   Generalized weakness 04/12/2022   Acute cystitis without hematuria 04/09/2022   Necrotizing pneumonia (HCC) 04/06/2022   Tobacco use 04/02/2022   GERD (gastroesophageal reflux disease) 04/02/2022   Schizophrenia (HCC) 04/02/2022   Leukocytosis 04/02/2022   Hypokalemia 04/02/2022   Lung mass 04/02/2022   Malignant neoplasm of left  female breast (HCC) 06/05/2015    Orientation RESPIRATION BLADDER Height & Weight     Self, Place  Normal Incontinent, External catheter Weight: 91 lb (41.3 kg) Height:  5' 5 (165.1 cm)  BEHAVIORAL SYMPTOMS/MOOD NEUROLOGICAL BOWEL NUTRITION STATUS   (None)  (None) Incontinent Diet (Regular)  AMBULATORY STATUS COMMUNICATION OF NEEDS Skin     Verbally Other (Comment), PU Stage and Appropriate Care (Erythema/redness.)   PU Stage 2 Dressing:  (Sacrum: Foam every 3 days.)                   Personal Care Assistance Level of Assistance              Functional Limitations Info  Sight, Speech, Hearing Sight Info: Adequate Hearing Info: Adequate Speech Info: Adequate    SPECIAL CARE FACTORS FREQUENCY                       Contractures Contractures Info: Not present    Additional Factors Info  Code Status, Allergies, Isolation Precautions Code Status Info: Full code Allergies Info: Cat dander, Lactose intolerance (gi), Mixed ragweed, Peanut-containing drug products     Isolation Precautions Info: Droplet precautions: Respiratory panel pending.     Current Medications (09/30/2023):  This is the current hospital active medication list Current Facility-Administered Medications  Medication Dose Route Frequency Provider Last Rate Last Admin   acetaminophen  (TYLENOL ) tablet 650 mg  650 mg Oral Q6H PRN Cox, Amy N, DO       Or   acetaminophen  (TYLENOL ) suppository 650 mg  650 mg Rectal Q6H PRN Cox, Amy N, DO  acidophilus (RISAQUAD) capsule 2 capsule  2 capsule Oral TID Amin, Sumayya, MD   2 capsule at 09/29/23 2138   ascorbic acid (VITAMIN C) tablet 500 mg  500 mg Oral BID Amin, Sumayya, MD       azithromycin  (ZITHROMAX ) 500 mg in sodium chloride  0.9 % 250 mL IVPB  500 mg Intravenous Q24H Chappell, Alex B, RPH 250 mL/hr at 09/29/23 1948 500 mg at 09/29/23 1948   [START ON 10/01/2023] azithromycin  (ZITHROMAX ) tablet 500 mg  500 mg Oral q1800 Chappell, Alex B, RPH        bisacodyl  (DULCOLAX) EC tablet 5 mg  5 mg Oral Daily PRN Cox, Amy N, DO       cefTRIAXone  (ROCEPHIN ) 2 g in sodium chloride  0.9 % 100 mL IVPB  2 g Intravenous Q24H Cox, Amy N, DO 200 mL/hr at 09/29/23 1634 2 g at 09/29/23 1634   cloZAPine  (CLOZARIL ) tablet 200 mg  200 mg Oral QHS Amin, Sumayya, MD   200 mg at 09/29/23 2138   feeding supplement (ENSURE PLUS HIGH PROTEIN) liquid 237 mL  237 mL Oral BID BM Amin, Sumayya, MD       heparin ADULT infusion 100 units/mL (25000 units/250mL)  750 Units/hr Intravenous Continuous Scotty Cyphers, RPH 7.5 mL/hr at 09/30/23 0817 750 Units/hr at 09/30/23 9528   linaclotide  (LINZESS ) capsule 145 mcg  145 mcg Oral Daily Amin, Sumayya, MD       melatonin tablet 5 mg  5 mg Oral QHS Cox, Amy N, DO   5 mg at 09/29/23 2138   midodrine  (PROAMATINE ) tablet 2.5 mg  2.5 mg Oral TID WC Cox, Amy N, DO   2.5 mg at 09/29/23 1626   montelukast  (SINGULAIR ) tablet 5 mg  5 mg Oral QHS Amin, Sumayya, MD       multivitamin with minerals tablet 1 tablet  1 tablet Oral Daily Amin, Sumayya, MD       ondansetron  (ZOFRAN ) tablet 4 mg  4 mg Oral Q6H PRN Cox, Amy N, DO       Or   ondansetron  (ZOFRAN ) injection 4 mg  4 mg Intravenous Q6H PRN Cox, Amy N, DO       pantoprazole  (PROTONIX ) EC tablet 40 mg  40 mg Oral Daily Cox, Amy N, DO   40 mg at 09/29/23 4132   potassium chloride  (KLOR-CON ) packet 40 mEq  40 mEq Oral Once Amin, Sumayya, MD       senna-docusate (Senokot-S) tablet 1 tablet  1 tablet Oral QHS PRN Cox, Amy N, DO       thiamine  (VITAMIN B1) tablet 100 mg  100 mg Oral Daily Amin, Sumayya, MD   100 mg at 09/29/23 1713   traZODone (DESYREL) tablet 25 mg  25 mg Oral QHS PRN Cox, Amy N, DO   25 mg at 09/29/23 0225   vancomycin  (VANCOREADY) IVPB 500 mg/100 mL  500 mg Intravenous Q24H Amin, Sumayya, MD 100 mL/hr at 09/29/23 1625 500 mg at 09/29/23 1625   zinc  sulfate (50mg  elemental zinc ) capsule 220 mg  220 mg Oral Daily Amin, Sumayya, MD         Discharge Medications: Please  see discharge summary for a list of discharge medications.  Relevant Imaging Results:  Relevant Lab Results:   Additional Information SS#: [id number]  Selena Bennett, LCSW

## 2023-09-30 NOTE — Assessment & Plan Note (Addendum)
 Hypokalemia and hypomagnesemia improved but patient is having hypophosphatemia today. - Replete phosphorus and monitor electrolytes

## 2023-09-30 NOTE — Assessment & Plan Note (Addendum)
 Patient met sepsis criteria with leukocytosis, tachycardia and tachypnea.  No endorgan damage. Mild hypoxia with on admission but she also had PE. Concern of bilateral pneumonia.  MRSA PCR positive Imaging also concerning for sinusitis, UA concern of UTI Preliminary blood cultures negative, urine cultures with ESBL E. Coli-ordered 1 dose of fosfomycin. Procalcitonin negative, respiratory viral panel positive for rhino and parainfluenza virus. - Continue with ceftriaxone , vancomycin  and Zithromax -will complete a 5-day course due to her significant debility -Continue with supportive care -Chest PT was ordered as patient seems very congested

## 2023-09-30 NOTE — Assessment & Plan Note (Signed)
 Present on admission Urine cultures with ESBL E. Coli. As patient is unable to explain symptoms and no leukocytosis we decided to give her 1 dose of fosfomycin

## 2023-09-30 NOTE — Progress Notes (Signed)
 Progress Note   Patient: Selena Pittman DGL:[id number] DOB: January 20, 1951 DOA: 09/28/2023     2 DOS: the patient was seen and examined on 09/30/2023   Brief hospital course: Selena Pittman is a 73 year old female with history of hypertension, on midodrine , GERD, insomnia, left breast cancer, anxiety, depression, hypothyroid, bipolar, who presents ED for chief concerns of hypotension and hypoxia at [facility] house.  Vitals in the ED showed t 98.3, respiration rate of 18, heart rate 112, blood pressure 83/64, improved to 99/67, SpO2 of 92% on 2 L nasal cannula.  Serum sodium is 139, potassium 3.0, chloride one 0.6, bicarb 23, BUN of 7, serum creatinine 0.35, EGFR greater than 60, nonfasting blood glucose 99, WBC 11.6, hemoglobin 12.2, platelets of 474.HS troponin is 38, on repeat is 49. COVID/influenza A/influenza B/RSV PCR were negative.Lactic acid is 1.3. Chest x-ray with concern of atypical pneumonia. CTA chest with small bilateral PE and bilateral infiltrate concerning for pneumonia CT head with no acute abnormality, age-related atrophy and moderate paranasal sinus disease.  CT abdomen and pelvis with concern of enterocolitis and fecal impaction in rectum.  ED treatment: Heparin bolus and gtt., vancomycin , cefepime , sodium chloride  1.5 liter bolus, midodrine  2.5 mg p.o. one-time dose.  6/11: Vital stable, preliminary blood cultures negative.  MRI brain with no acute intracranial abnormality.  Mucosal thickening with air-fluid level in left sphenoid and maxillary sinuses more consistent with acute sinusitis. MRSA PCR positive so adding vancomycin . Lower extremity venous Doppler with extensive bilateral DVT, right greater than left.  Echocardiogram was normal. Vascular surgery was consulted to see if she is a candidate for any intervention or IVC filter placement.  6/12: Patient was little more lethargic this morning so ABG was done, it shows pH of 7.51, CO2 30 and mild hypoxia with pO2  of 65.  Placed on 2 L of oxygen.  Later she did woke up.  Procalcitonin negative so checking respiratory viral panel as he might be having viral pneumonia.  Urine cultures with ESBL E. coli-ordered 1 dose of fosfomycin. Based on her debility-vascular evaluated her and she will not be a candidate for any intervention.  Heparin is being switched with Xarelto. Chest PT ordered as she remained quite congested. Mild hypokalemia and hypomagnesemia-replating electrolytes  Assessment and Plan: * Sepsis due to pneumonia [facility]) Patient met sepsis criteria with leukocytosis, tachycardia and tachypnea.  No endorgan damage. Mild hypoxia with on admission but she also had PE. Concern of bilateral pneumonia.  MRSA PCR positive Imaging also concerning for sinusitis, UA concern of UTI Preliminary blood cultures negative, urine cultures with ESBL E. Coli-ordered 1 dose of fosfomycin. Procalcitonin negative -Ordered respiratory viral panel - Continue with ceftriaxone , vancomycin  and Zithromax  -Continue with supportive care -Chest PT was ordered as patient seems very congested  Pyuria Present on admission Urine cultures with ESBL E. Coli. As patient is unable to explain symptoms and no leukocytosis we decided to give her 1 dose of fosfomycin  Multilobar lung infiltrate R > L, MRSA PCR positive -Management as above  Acute pulmonary embolism (HCC) Right lower lobe PE, lower extremity venous Doppler positive for extensive bilateral DVT, right greater than left - Vascular surgery was also consulted to see if she is a candidate for any procedure. - Patient is not a candidate for any procedure based on extremely debilitated state. - Heparin is being switched with Xarelto  Right-sided sensory deficit present Patient does not know how long this has been going on Per son, patient is right handed  CT head and MRI brain  negative for any acute abnormality  Hypokalemia Found to have mild hypokalemia and  hypomagnesemia today. - Replete electrolytes and monitor  Protein-calorie malnutrition, severe (HCC) Estimated body mass index is 15.14 kg/m as calculated from the following:   Height as of this encounter: 5' 5 (1.651 m).   Weight as of this encounter: 41.3 kg.   - Dietitian consult      Subjective: Patient was quite alert and complaining of generalized aches and pain stating that she is not feeling well.  Continue to have chest congestion and cough.  Per nursing concern patient was quite lethargic and somnolent earlier.  Physical Exam: Vitals:   09/29/23 2355 09/30/23 0440 09/30/23 0739 09/30/23 1121  BP: 105/74 94/67 99/64  94/66  Pulse: (!) 102 92 83 86  Resp: 17 17 16    Temp: 97.9 F (36.6 C) 97.7 F (36.5 C)  (!) 96.8 F (36 C)  TempSrc: Oral Oral    SpO2: 96% 97% 98% 99%  Weight:      Height:       General.  Frail and severely malnourished elderly lady, in no acute distress. Pulmonary.  Junky chest bilaterally, normal respiratory effort. CV.  Regular rate and rhythm, no JVD, rub or murmur. Abdomen.  Soft, nontender, nondistended, BS positive. CNS.  Alert and oriented .  No focal neurologic deficit. Extremities.  Trace LE edema,  pulses intact and symmetrical.  Data Reviewed: Prior data reviewed  Family Communication: Talked with son on phone.  Disposition: Status is: Inpatient Remains inpatient appropriate because: Severity of illness  Planned Discharge Destination: Skilled nursing facility  DVT prophylaxis.  Xarelto Time spent: 50 minutes  This record has been created using Conservation officer, historic buildings. Errors have been sought and corrected,but may not always be located. Such creation errors do not reflect on the standard of care.   Author: Luna Salinas, MD 09/30/2023 2:43 PM  For on call review [email or web address].

## 2023-09-30 NOTE — Progress Notes (Signed)
 Patient started drooling after nasotracheal suctioning was done, MD made aware of it, will keep monitoring.

## 2023-10-01 DIAGNOSIS — A4101 Sepsis due to Methicillin susceptible Staphylococcus aureus: Secondary | ICD-10-CM | POA: Diagnosis not present

## 2023-10-01 DIAGNOSIS — R918 Other nonspecific abnormal finding of lung field: Secondary | ICD-10-CM | POA: Diagnosis not present

## 2023-10-01 DIAGNOSIS — R8281 Pyuria: Secondary | ICD-10-CM | POA: Diagnosis not present

## 2023-10-01 DIAGNOSIS — J189 Pneumonia, unspecified organism: Secondary | ICD-10-CM | POA: Diagnosis not present

## 2023-10-01 DIAGNOSIS — R531 Weakness: Secondary | ICD-10-CM | POA: Diagnosis not present

## 2023-10-01 DIAGNOSIS — I82413 Acute embolism and thrombosis of femoral vein, bilateral: Secondary | ICD-10-CM

## 2023-10-01 LAB — CBC
HCT: 34.1 % — ABNORMAL LOW (ref 36.0–46.0)
Hemoglobin: 11.5 g/dL — ABNORMAL LOW (ref 12.0–15.0)
MCH: 31 pg (ref 26.0–34.0)
MCHC: 33.7 g/dL (ref 30.0–36.0)
MCV: 91.9 fL (ref 80.0–100.0)
Platelets: 364 10*3/uL (ref 150–400)
RBC: 3.71 MIL/uL — ABNORMAL LOW (ref 3.87–5.11)
RDW: 16.6 % — ABNORMAL HIGH (ref 11.5–15.5)
WBC: 11 10*3/uL — ABNORMAL HIGH (ref 4.0–10.5)
nRBC: 0 % (ref 0.0–0.2)

## 2023-10-01 LAB — RENAL FUNCTION PANEL
Albumin: 1.6 g/dL — ABNORMAL LOW (ref 3.5–5.0)
Anion gap: 10 (ref 5–15)
BUN: <5 mg/dL — ABNORMAL LOW (ref 8–23)
CO2: 17 mmol/L — ABNORMAL LOW (ref 22–32)
Calcium: 7.2 mg/dL — ABNORMAL LOW (ref 8.9–10.3)
Chloride: 106 mmol/L (ref 98–111)
Creatinine, Ser: 0.35 mg/dL — ABNORMAL LOW (ref 0.44–1.00)
GFR, Estimated: >60 mL/min (ref >60)
Glucose, Bld: 180 mg/dL — ABNORMAL HIGH (ref 70–99)
Phosphorus: 2.2 mg/dL — ABNORMAL LOW (ref 2.5–4.6)
Potassium: 4.6 mmol/L (ref 3.5–5.1)
Sodium: 133 mmol/L — ABNORMAL LOW (ref 135–145)

## 2023-10-01 LAB — BLOOD GAS, ARTERIAL
Acid-Base Excess: 1.7 mmol/L (ref 0.0–2.0)
Bicarbonate: 23.9 mmol/L (ref 20.0–28.0)
FIO2: 0.21 %
O2 Saturation: 95.2 %
Patient temperature: 37
pCO2 arterial: 30 mmHg — ABNORMAL LOW (ref 32–48)
pH, Arterial: 7.51 — ABNORMAL HIGH (ref 7.35–7.45)
pO2, Arterial: 65 mmHg — ABNORMAL LOW (ref 83–108)

## 2023-10-01 LAB — GLUCOSE, CAPILLARY
Glucose-Capillary: 111 mg/dL — ABNORMAL HIGH (ref 70–99)
Glucose-Capillary: 183 mg/dL — ABNORMAL HIGH (ref 70–99)
Glucose-Capillary: 107 mg/dL — ABNORMAL HIGH (ref 70–99)

## 2023-10-01 LAB — MAGNESIUM: Magnesium: 2.4 mg/dL (ref 1.7–2.4)

## 2023-10-01 MED ORDER — SODIUM PHOSPHATES 45 MMOLE/15ML IV SOLN
30.0000 mmol | Freq: Once | INTRAVENOUS | Status: AC
  Administered 2023-10-01: 30 mmol via INTRAVENOUS
  Filled 2023-10-01: qty 10

## 2023-10-01 MED ORDER — FOSFOMYCIN TROMETHAMINE 3 G PO PACK
3.0000 g | PACK | Freq: Once | ORAL | Status: AC
  Administered 2023-10-01: 3 g via ORAL
  Filled 2023-10-01: qty 3

## 2023-10-01 MED ORDER — IPRATROPIUM-ALBUTEROL 0.5-2.5 (3) MG/3ML IN SOLN
3.0000 mL | Freq: Four times a day (QID) | RESPIRATORY_TRACT | Status: DC
  Administered 2023-10-01 – 2023-10-05 (×17): 3 mL via RESPIRATORY_TRACT
  Filled 2023-10-01 (×16): qty 3

## 2023-10-01 MED ORDER — LACTULOSE 10 GM/15ML PO SOLN
20.0000 g | Freq: Two times a day (BID) | ORAL | Status: AC
  Administered 2023-10-01 (×2): 20 g via ORAL
  Filled 2023-10-01 (×2): qty 30

## 2023-10-01 NOTE — Progress Notes (Signed)
 Progress Note   Patient: Selena Pittman UEA:[id number] DOB: 23-Apr-1950 DOA: 09/28/2023     3 DOS: the patient was seen and examined on 10/01/2023   Brief hospital course: [name] is a 73 year old female with history of hypertension, on midodrine , GERD, insomnia, left breast cancer, anxiety, depression, hypothyroid, bipolar, who presents ED for chief concerns of hypotension and hypoxia at [facility] house.  Vitals in the ED showed t 98.3, respiration rate of 18, heart rate 112, blood pressure 83/64, improved to 99/67, SpO2 of 92% on 2 L nasal cannula.  Serum sodium is 139, potassium 3.0, chloride one 0.6, bicarb 23, BUN of 7, serum creatinine 0.35, EGFR greater than 60, nonfasting blood glucose 99, WBC 11.6, hemoglobin 12.2, platelets of 474.HS troponin is 38, on repeat is 49. COVID/influenza A/influenza B/RSV PCR were negative.Lactic acid is 1.3. Chest x-ray with concern of atypical pneumonia. CTA chest with small bilateral PE and bilateral infiltrate concerning for pneumonia CT head with no acute abnormality, age-related atrophy and moderate paranasal sinus disease.  CT abdomen and pelvis with concern of enterocolitis and fecal impaction in rectum.  ED treatment: Heparin  bolus and gtt., vancomycin , cefepime , sodium chloride  1.5 liter bolus, midodrine  2.5 mg p.o. one-time dose.  6/11: Vital stable, preliminary blood cultures negative.  MRI brain with no acute intracranial abnormality.  Mucosal thickening with air-fluid level in left sphenoid and maxillary sinuses more consistent with acute sinusitis. MRSA PCR positive so adding vancomycin . Lower extremity venous Doppler with extensive bilateral DVT, right greater than left.  Echocardiogram was normal. Vascular surgery was consulted to see if she is a candidate for any intervention or IVC filter placement.  6/12: Patient was little more lethargic this morning so ABG was done, it shows pH of 7.51, CO2 30 and mild hypoxia with pO2  of 65.  Placed on 2 L of oxygen.  Later she did woke up.  Procalcitonin negative so checking respiratory viral panel as he might be having viral pneumonia.  Urine cultures with ESBL E. coli-ordered 1 dose of fosfomycin. Based on her debility-vascular evaluated her and she will not be a candidate for any intervention.  Heparin  is being switched with Xarelto . Chest PT ordered as she remained quite congested. Mild hypokalemia and hypomagnesemia-replating electrolytes  6/13: Patient remained quite congested.  Respiratory viral panel positive for rhino and parainfluenza virus.  Mild hypophosphatemia today which is being repleted.  Assessment and Plan: * Sepsis due to pneumonia [facility]) Patient met sepsis criteria with leukocytosis, tachycardia and tachypnea.  No endorgan damage. Mild hypoxia with on admission but she also had PE. Concern of bilateral pneumonia.  MRSA PCR positive Imaging also concerning for sinusitis, UA concern of UTI Preliminary blood cultures negative, urine cultures with ESBL E. Coli-ordered 1 dose of fosfomycin. Procalcitonin negative, respiratory viral panel positive for rhino and parainfluenza virus. - Continue with ceftriaxone , vancomycin  and Zithromax -will complete a 5-day course due to her significant debility -Continue with supportive care -Chest PT was ordered as patient seems very congested  UTI (urinary tract infection) Present on admission Urine cultures with ESBL E. Coli. As patient is unable to explain symptoms and no leukocytosis we decided to give her 1 dose of fosfomycin  Multilobar lung infiltrate R > L, MRSA PCR positive -Management as above  Acute pulmonary embolism (HCC) Right lower lobe PE, lower extremity venous Doppler positive for extensive bilateral DVT, right greater than left - Vascular surgery was also consulted to see if she is a candidate for any procedure. - Patient  is not a candidate for any procedure based on extremely debilitated state. -  Heparin  is being switched with Xarelto   Right-sided sensory deficit present Patient does not know how long this has been going on Per son, patient is right handed CT head and MRI brain  negative for any acute abnormality  Hypokalemia Hypokalemia and hypomagnesemia improved but patient is having hypophosphatemia today. - Replete phosphorus and monitor electrolytes  Protein-calorie malnutrition, severe (HCC) Estimated body mass index is 15.14 kg/m as calculated from the following:   Height as of this encounter: 5' 5 (1.651 m).   Weight as of this encounter: 41.3 kg.   - Dietitian consult      Subjective: Patient was eating breakfast with nursing help when seen today.  Still feeling very sick and congested.  Physical Exam: Vitals:   10/01/23 0352 10/01/23 0737 10/01/23 1145 10/01/23 1340  BP: 103/74 101/71 (!) 87/69   Pulse: 86 (!) 116 (!) 113   Resp: 17 20 18    Temp: 97.8 F (36.6 C) 97.9 F (36.6 C) 98.5 F (36.9 C)   TempSrc: Oral     SpO2: 100% 98% 96% 98%  Weight:      Height:       General.  Frail and malnourished elderly lady, in no acute distress. Pulmonary.  Congested and junky chest, normal respiratory effort. CV.  Regular rate and rhythm, no JVD, rub or murmur. Abdomen.  Soft, nontender, nondistended, BS positive. CNS.  Alert and oriented .  No focal neurologic deficit. Extremities.  No edema,  pulses intact and symmetrical.  Data Reviewed: Prior data reviewed  Family Communication:   Disposition: Status is: Inpatient Remains inpatient appropriate because: Severity of illness  Planned Discharge Destination: Skilled nursing facility  DVT prophylaxis.  Xarelto  Time spent: 50 minutes  This record has been created using Conservation officer, historic buildings. Errors have been sought and corrected,but may not always be located. Such creation errors do not reflect on the standard of care.   Author: Luna Salinas, MD 10/01/2023 3:07 PM  For on call review  [email or web address].

## 2023-10-01 NOTE — Care Management Important Message (Signed)
 Important Message  Patient Details  Name: Selena Pittman MRN: [id number] Date of Birth: [birth year]   Important Message Given:  Yes - Medicare IM     Anise Kerns 10/01/2023, 9:10 AM

## 2023-10-02 ENCOUNTER — Other Ambulatory Visit: Payer: Self-pay

## 2023-10-02 ENCOUNTER — Inpatient Hospital Stay

## 2023-10-02 DIAGNOSIS — R627 Adult failure to thrive: Secondary | ICD-10-CM

## 2023-10-02 DIAGNOSIS — D72829 Elevated white blood cell count, unspecified: Secondary | ICD-10-CM

## 2023-10-02 DIAGNOSIS — J189 Pneumonia, unspecified organism: Secondary | ICD-10-CM | POA: Diagnosis not present

## 2023-10-02 DIAGNOSIS — I2699 Other pulmonary embolism without acute cor pulmonale: Secondary | ICD-10-CM | POA: Diagnosis not present

## 2023-10-02 DIAGNOSIS — J9601 Acute respiratory failure with hypoxia: Secondary | ICD-10-CM

## 2023-10-02 DIAGNOSIS — I10 Essential (primary) hypertension: Secondary | ICD-10-CM

## 2023-10-02 DIAGNOSIS — R579 Shock, unspecified: Secondary | ICD-10-CM

## 2023-10-02 DIAGNOSIS — E785 Hyperlipidemia, unspecified: Secondary | ICD-10-CM

## 2023-10-02 DIAGNOSIS — N390 Urinary tract infection, site not specified: Secondary | ICD-10-CM

## 2023-10-02 DIAGNOSIS — A419 Sepsis, unspecified organism: Secondary | ICD-10-CM | POA: Diagnosis not present

## 2023-10-02 DIAGNOSIS — R531 Weakness: Secondary | ICD-10-CM | POA: Diagnosis not present

## 2023-10-02 DIAGNOSIS — A4101 Sepsis due to Methicillin susceptible Staphylococcus aureus: Secondary | ICD-10-CM | POA: Diagnosis not present

## 2023-10-02 LAB — BLOOD GAS, ARTERIAL
Acid-base deficit: 4.3 mmol/L — ABNORMAL HIGH (ref 0.0–2.0)
Bicarbonate: 23 mmol/L (ref 20.0–28.0)
FIO2: 40 %
O2 Saturation: 99.1 %
PEEP: 5 cmH2O
Patient temperature: 37
RATE: 18 {breaths}/min
Spontaneous VT: 330 mL
pCO2 arterial: 50 mmHg — ABNORMAL HIGH (ref 32–48)
pH, Arterial: 7.27 — ABNORMAL LOW (ref 7.35–7.45)
pO2, Arterial: 116 mmHg — ABNORMAL HIGH (ref 83–108)

## 2023-10-02 LAB — GLUCOSE, CAPILLARY
Glucose-Capillary: 164 mg/dL — ABNORMAL HIGH (ref 70–99)
Glucose-Capillary: 172 mg/dL — ABNORMAL HIGH (ref 70–99)

## 2023-10-02 MED ORDER — ADULT MULTIVITAMIN W/MINERALS CH
1.0000 | ORAL_TABLET | Freq: Every day | ORAL | Status: DC
  Administered 2023-10-03 – 2023-10-13 (×10): 1
  Filled 2023-10-02 (×10): qty 1

## 2023-10-02 MED ORDER — MONTELUKAST SODIUM 10 MG PO TABS
5.0000 mg | ORAL_TABLET | Freq: Every day | ORAL | Status: DC
  Administered 2023-10-03 – 2023-10-13 (×11): 5 mg
  Filled 2023-10-02 (×13): qty 1

## 2023-10-02 MED ORDER — NOREPINEPHRINE 16 MG/250ML-% IV SOLN
0.0000 ug/min | INTRAVENOUS | Status: DC
  Administered 2023-10-02: 5 ug/min via INTRAVENOUS
  Administered 2023-10-03: 15 ug/min via INTRAVENOUS
  Administered 2023-10-04: 13 ug/min via INTRAVENOUS
  Administered 2023-10-04: 30 ug/min via INTRAVENOUS
  Administered 2023-10-05: 14 ug/min via INTRAVENOUS
  Administered 2023-10-07: 32 ug/min via INTRAVENOUS
  Administered 2023-10-08: 34 ug/min via INTRAVENOUS
  Administered 2023-10-08 – 2023-10-09 (×2): 20 ug/min via INTRAVENOUS
  Administered 2023-10-10: 9 ug/min via INTRAVENOUS
  Filled 2023-10-02 (×11): qty 250

## 2023-10-02 MED ORDER — THIAMINE HCL 100 MG PO TABS
100.0000 mg | ORAL_TABLET | Freq: Every day | ORAL | Status: DC
  Administered 2023-10-03 – 2023-10-13 (×10): 100 mg
  Filled 2023-10-02 (×20): qty 1

## 2023-10-02 MED ORDER — GUAIFENESIN 100 MG/5ML PO LIQD
5.0000 mL | Freq: Two times a day (BID) | ORAL | Status: DC
  Administered 2023-10-02 – 2023-10-03 (×2): 5 mL
  Filled 2023-10-02 (×2): qty 10

## 2023-10-02 MED ORDER — ORAL CARE MOUTH RINSE
15.0000 mL | OROMUCOSAL | Status: DC
  Administered 2023-10-02 – 2023-10-12 (×117): 15 mL via OROMUCOSAL

## 2023-10-02 MED ORDER — ROCURONIUM BROMIDE 10 MG/ML (PF) SYRINGE: 50.0000 mg | PREFILLED_SYRINGE | Freq: Once | INTRAVENOUS | Status: AC

## 2023-10-02 MED ORDER — SCOPOLAMINE 1 MG/3DAYS TD PT72
1.0000 | MEDICATED_PATCH | TRANSDERMAL | Status: DC
  Filled 2023-10-02: qty 1

## 2023-10-02 MED ORDER — KETAMINE HCL 50 MG/5ML IJ SOSY
PREFILLED_SYRINGE | INTRAMUSCULAR | Status: AC
  Administered 2023-10-02: 50 mg via INTRAVENOUS
  Filled 2023-10-02: qty 10

## 2023-10-02 MED ORDER — LACTATED RINGERS IV BOLUS
500.0000 mL | Freq: Once | INTRAVENOUS | Status: AC
  Administered 2023-10-02: 500 mL via INTRAVENOUS

## 2023-10-02 MED ORDER — KETAMINE HCL 50 MG/5ML IJ SOSY: 50.0000 mg | PREFILLED_SYRINGE | Freq: Once | INTRAMUSCULAR | Status: AC

## 2023-10-02 MED ORDER — NOREPINEPHRINE 4 MG/250ML-% IV SOLN
0.0000 ug/min | INTRAVENOUS | Status: DC
  Administered 2023-10-02: 5 ug/min via INTRAVENOUS
  Filled 2023-10-02: qty 250

## 2023-10-02 MED ORDER — ETOMIDATE 2 MG/ML IV SOLN
INTRAVENOUS | Status: AC
  Filled 2023-10-02: qty 20

## 2023-10-02 MED ORDER — HEPARIN (PORCINE) 25000 UT/250ML-% IV SOLN
950.0000 [IU]/h | INTRAVENOUS | Status: DC
  Administered 2023-10-02: 750 [IU]/h via INTRAVENOUS
  Administered 2023-10-04: 650 [IU]/h via INTRAVENOUS
  Administered 2023-10-06 – 2023-10-10 (×4): 950 [IU]/h via INTRAVENOUS
  Filled 2023-10-02 (×7): qty 250

## 2023-10-02 MED ORDER — PROPOFOL 1000 MG/100ML IV EMUL
0.0000 ug/kg/min | INTRAVENOUS | Status: DC
  Administered 2023-10-02: 15 ug/kg/min via INTRAVENOUS
  Administered 2023-10-03 – 2023-10-04 (×3): 20 ug/kg/min via INTRAVENOUS
  Administered 2023-10-04: 25 ug/kg/min via INTRAVENOUS
  Filled 2023-10-02 (×4): qty 100

## 2023-10-02 MED ORDER — LINACLOTIDE 145 MCG PO CAPS
145.0000 ug | ORAL_CAPSULE | Freq: Every day | ORAL | Status: DC
  Administered 2023-10-03 – 2023-10-06 (×4): 145 ug
  Filled 2023-10-02 (×4): qty 1

## 2023-10-02 MED ORDER — PROPOFOL 1000 MG/100ML IV EMUL
INTRAVENOUS | Status: AC
  Administered 2023-10-02: 15 ug/kg/min via INTRAVENOUS
  Filled 2023-10-02: qty 100

## 2023-10-02 MED ORDER — MIDAZOLAM HCL 2 MG/2ML IJ SOLN
INTRAMUSCULAR | Status: AC
  Administered 2023-10-02: 2 mg via INTRAVENOUS
  Filled 2023-10-02: qty 2

## 2023-10-02 MED ORDER — SENNOSIDES-DOCUSATE SODIUM 8.6-50 MG PO TABS: 1.0000 | ORAL_TABLET | Freq: Every evening | ORAL | Status: DC | PRN

## 2023-10-02 MED ORDER — ROCURONIUM BROMIDE 10 MG/ML (PF) SYRINGE
PREFILLED_SYRINGE | INTRAVENOUS | Status: AC
  Administered 2023-10-02: 50 mg via INTRAVENOUS
  Filled 2023-10-02: qty 10

## 2023-10-02 MED ORDER — ORAL CARE MOUTH RINSE: 15.0000 mL | OROMUCOSAL | Status: DC | PRN

## 2023-10-02 MED ORDER — MIDAZOLAM HCL 2 MG/2ML IJ SOLN: 2.0000 mg | Freq: Once | INTRAMUSCULAR | Status: AC

## 2023-10-02 MED ORDER — CHLORHEXIDINE GLUCONATE CLOTH 2 % EX PADS
6.0000 | MEDICATED_PAD | Freq: Every day | CUTANEOUS | Status: DC
  Administered 2023-10-02 – 2023-10-14 (×13): 6 via TOPICAL

## 2023-10-02 MED ORDER — PANTOPRAZOLE SODIUM 40 MG IV SOLR
40.0000 mg | INTRAVENOUS | Status: DC
  Administered 2023-10-03 – 2023-10-12 (×10): 40 mg via INTRAVENOUS
  Filled 2023-10-02 (×10): qty 10

## 2023-10-02 MED ORDER — NOREPINEPHRINE 4 MG/250ML-% IV SOLN
INTRAVENOUS | Status: AC
  Filled 2023-10-02: qty 250

## 2023-10-02 MED ORDER — FENTANYL CITRATE PF 50 MCG/ML IJ SOSY
50.0000 ug | PREFILLED_SYRINGE | INTRAMUSCULAR | Status: DC | PRN
  Administered 2023-10-02 – 2023-10-05 (×4): 50 ug via INTRAVENOUS
  Filled 2023-10-02 (×6): qty 1

## 2023-10-02 MED ORDER — LACTATED RINGERS IV BOLUS
1000.0000 mL | Freq: Once | INTRAVENOUS | Status: AC
  Administered 2023-10-02: 1000 mL via INTRAVENOUS

## 2023-10-02 MED ORDER — SODIUM CHLORIDE 0.9 % IV SOLN: INTRAVENOUS | Status: DC | PRN

## 2023-10-02 MED ORDER — VITAMIN C 500 MG PO TABS
500.0000 mg | ORAL_TABLET | Freq: Two times a day (BID) | ORAL | Status: DC
  Administered 2023-10-03 – 2023-10-13 (×21): 500 mg
  Filled 2023-10-02 (×23): qty 1

## 2023-10-02 NOTE — Progress Notes (Signed)
 MD JP to bedside with portable Xray and gave verbal order to this RN that Left internal jugular in place and safe for use at this time. Sedation and Norepinephrine  transitioned to central line by this RN.

## 2023-10-02 NOTE — Assessment & Plan Note (Signed)
 Patient became hypoxic, tachycardic and tachypneic.  Very weak cough and unable to clear secretions, concern of mucous plugging as she was having a lot of secretions requiring frequent suctioning earlier.  As patient remain full scope of care with full code per son she was transferred to ICU and was intubated. - PCCM is going to take over her care at this point -Need continuation of goals of care discussion due to significant underlying debility and comorbidities

## 2023-10-02 NOTE — Significant Event (Incomplete)
 Rapid Response Event Note   Reason for Call :  Respiratory distress  Initial Focused Assessment:   Patient SOB, tachypneic, and dusky in color.  Oxygen saturation dropped to 70's- patient having tons of secretions.  BP 70's systolic.  HR 120's NSR.      Interventions:  Patient placed on 15 liters NRB mask- bolus given.    Plan of Care:     Event Summary:   MD Notified:  Call Time: Arrival Time: End Time:  Allyn Arenas, RN

## 2023-10-02 NOTE — TOC Progression Note (Signed)
 Transition of Care [facility]) - Progression Note    Patient Details  Name: Selena Pittman MRN: [id number] Date of Birth: 18-Nov-1950  Transition of Care [facility]) CM/SW Contact  [place], [place] Phone Number: 10/02/2023, 3:17 PM  Clinical Narrative:     CSW follow-up with RN regarding discharge. Pt is now intubated. Pt. Will not be discharged this weekend.   Expected Discharge Plan: Skilled Nursing Facility Barriers to Discharge: Continued Medical Work up  Expected Discharge Plan and Services       Living arrangements for the past 2 months: Skilled Nursing Facility                                       Social Determinants of Health (SDOH) Interventions SDOH Screenings   Food Insecurity: No Food Insecurity (10/29/2022)  Housing: Low Risk  (10/29/2022)  Transportation Needs: No Transportation Needs (10/29/2022)  Utilities: Not At Risk (10/29/2022)  Social Connections: Patient Unable To Answer (09/29/2023)  Tobacco Use: High Risk (09/28/2023)    Readmission Risk Interventions    09/30/2023   10:38 AM 07/30/2023   12:52 PM  Readmission Risk Prevention Plan  Transportation Screening Complete Complete  PCP or Specialist Appt within 3-5 Days Complete   HRI or Home Care Consult  Complete  Social Work Consult for Recovery Care Planning/Counseling Complete   Palliative Care Screening Not Applicable   Medication Review Oceanographer) Complete Complete

## 2023-10-02 NOTE — Progress Notes (Addendum)
 Pt intubated with a 7.5 oral ET tube on first attempt by Dr Lucina Sabal. Positive color change noted on capnometer,Bilateral chest rise noted, and breath sounds auscultated. Tube secured at 22cm at the lip with commercial tube holder.  Once intubated patient placed on servo-I and bedside bronchoscopy performed by Dr. Lucina Sabal with this RT, Pattie Borders RT, And [employer] assisting. Time out and consent was performed and obtained by appropriate personnel prior to procedure. PT tolerated well with no complications. BAL obtained, labeled, and took to lab for analysis.  Time Scope in :1318 Time Scope out: 1322

## 2023-10-02 NOTE — Procedures (Signed)
 Intubation Procedure Note  Selena Pittman  [id number]  11/04/1950  Date:10/02/23  Time:1:27 PM   Provider Performing:Jean-Pierre Lalisa Kiehn    Procedure: Intubation (31500)  Indication(s) Respiratory Failure  Consent Risks of the procedure as well as the alternatives and risks of each were explained to the patient and/or caregiver.  Consent for the procedure was obtained and is signed in the bedside chart   Anesthesia Versed , Rocuronium, and Ketamine  Time Out Verified patient identification, verified procedure, site/side was marked, verified correct patient position, special equipment/implants available, medications/allergies/relevant history reviewed, required imaging and test results available.  Sterile Technique Usual hand hygeine, masks, and gloves were used  Procedure Description Patient positioned in bed supine.  Sedation given as noted above.  Patient was intubated with endotracheal tube using Glidescope.  View was Grade 1 full glottis .  Number of attempts was 1.  Colorimetric CO2 detector was consistent with tracheal placement.   Complications/Tolerance None; patient tolerated the procedure well. Chest X-ray is ordered to verify placement.  EBL Minimal  Specimen(s) None  Selena Kindler, MD [facility] 10/02/2023 1:27 PM

## 2023-10-02 NOTE — Assessment & Plan Note (Signed)
 Estimated body mass index is 16.55 kg/m as calculated from the following:   Height as of this encounter: 5' 5 (1.651 m).   Weight as of this encounter: 45.1 kg.   - Dietitian consult

## 2023-10-02 NOTE — Procedures (Signed)
 Bronchoscopy Procedure Note  Selena Pittman  [id number]  January 29, 1951  Date:10/02/23  Time:1:24 PM   Provider Performing:Jean-Pierre Steffie Waggoner   Procedure(s):  Flexible Bronchoscopy [id number])  Indication(s) Airway clearance   Consent Unable to obtain consent due to emergent nature of procedure.  Anesthesia GA  Time Out Verified patient identification, verified procedure, site/side was marked, verified correct patient position, special equipment/implants available, medications/allergies/relevant history reviewed, required imaging and test results available.  Sterile Technique Usual hand hygiene, masks, gowns, and gloves were used  Procedure Description Bronchoscope advanced through endotracheal tube and into airway.  Airways were examined down to subsegmental level with findings noted below.   Following diagnostic evaluation, BAL(s) performed in LLL with normal saline and return of 30cc serous fluid  Findings: Extensive secretions noted in the RM, BI, RML and RLL as well as LLL, These were cleared and did recur minimally. No suspicous lesions or foreign bodies noted. Left lung wash obtain and sample sent for culture.   Complications/Tolerance None; patient tolerated the procedure well. Chest X-ray is not needed post procedure.   EBL Minimal  [name], MD [facility] 10/02/2023 1:26 PM

## 2023-10-02 NOTE — IPAL (Signed)
 I had a prolonged discussion with her son Selena Pittman and her daughter as well. Appears that [name] suffered of mental disorder with schizophrenia and bipolar all of her life. She has recently been transferred to [facility] rehab from a different rehab but decides to spend her days in bed and does not do much at the rehab raising concern for depression. I discussed code status with her Son but he does not feel comfortable making her DNR and would like to exhaust all options. He had a court hearing on Monday to trasfer his legal guardianship as he himself is going through a lot of stress and his wife was recently diagnosed with sTage IV breast cancer unfortunately. I told him best case scenario is that we are able to extubate [name] and she will be able to be part of that decision.   [name], MD [facility] 10/02/2023 5:20 PM

## 2023-10-02 NOTE — Progress Notes (Signed)
 Progress Note   Patient: Selena Pittman ZOX:[id number] DOB: 1950/04/29 DOA: 09/28/2023     4 DOS: the patient was seen and examined on 10/02/2023   Brief hospital course: Ms. Dalisa Forrer is a 73 year old female with history of hypertension, on midodrine , GERD, insomnia, left breast cancer, anxiety, depression, hypothyroid, bipolar, who presents ED for chief concerns of hypotension and hypoxia at [facility] house.  Vitals in the ED showed t 98.3, respiration rate of 18, heart rate 112, blood pressure 83/64, improved to 99/67, SpO2 of 92% on 2 L nasal cannula.  Serum sodium is 139, potassium 3.0, chloride one 0.6, bicarb 23, BUN of 7, serum creatinine 0.35, EGFR greater than 60, nonfasting blood glucose 99, WBC 11.6, hemoglobin 12.2, platelets of 474.HS troponin is 38, on repeat is 49. COVID/influenza A/influenza B/RSV PCR were negative.Lactic acid is 1.3. Chest x-ray with concern of atypical pneumonia. CTA chest with small bilateral PE and bilateral infiltrate concerning for pneumonia CT head with no acute abnormality, age-related atrophy and moderate paranasal sinus disease.  CT abdomen and pelvis with concern of enterocolitis and fecal impaction in rectum.  ED treatment: Heparin  bolus and gtt., vancomycin , cefepime , sodium chloride  1.5 liter bolus, midodrine  2.5 mg p.o. one-time dose.  6/11: Vital stable, preliminary blood cultures negative.  MRI brain with no acute intracranial abnormality.  Mucosal thickening with air-fluid level in left sphenoid and maxillary sinuses more consistent with acute sinusitis. MRSA PCR positive so adding vancomycin . Lower extremity venous Doppler with extensive bilateral DVT, right greater than left.  Echocardiogram was normal. Vascular surgery was consulted to see if she is a candidate for any intervention or IVC filter placement.  6/12: Patient was little more lethargic this morning so ABG was done, it shows pH of 7.51, CO2 30 and mild hypoxia with pO2  of 65.  Placed on 2 L of oxygen.  Later she did woke up.  Procalcitonin negative so checking respiratory viral panel as he might be having viral pneumonia.  Urine cultures with ESBL E. coli-ordered 1 dose of fosfomycin. Based on her debility-vascular evaluated her and she will not be a candidate for any intervention.  Heparin  is being switched with Xarelto . Chest PT ordered as she remained quite congested. Mild hypokalemia and hypomagnesemia-replating electrolytes  6/13: Patient remained quite congested.  Respiratory viral panel positive for rhino and parainfluenza virus.  Mild hypophosphatemia today which is being repleted.  6/14: Patient with increased respiratory distress as she has a very weak cough and unable to clear her secretions.  Requiring frequent suctioning.  She became tachycardic, hypotensive and tachypneic was placed on nonrebreather.  Son was called and he wants to keep her full scope of care and full code at this time, patient was transferred to ICU and she is being intubated.  A lot of respiratory secretions. Palliative care was consulted yesterday to continue goals of care discussion as patient is very frail and cachectic with significant underlying comorbidities and will not be able to tolerate any CPR if needed.  Assessment and Plan: * Sepsis due to pneumonia [facility]) Patient met sepsis criteria with leukocytosis, tachycardia and tachypnea.  No endorgan damage. Mild hypoxia with on admission but she also had PE. Concern of bilateral pneumonia.  MRSA PCR positive Imaging also concerning for sinusitis, UA concern of UTI Preliminary blood cultures negative, urine cultures with ESBL E. Coli-ordered 1 dose of fosfomycin. Procalcitonin negative, respiratory viral panel positive for rhino and parainfluenza virus. - Continue with ceftriaxone , vancomycin  and Zithromax -will complete a 5-day course  due to her significant debility -Continue with supportive care -Chest PT was ordered as  patient seems very congested  Acute respiratory failure with hypoxia (HCC) Patient became hypoxic, tachycardic and tachypneic.  Very weak cough and unable to clear secretions, concern of mucous plugging as she was having a lot of secretions requiring frequent suctioning earlier.  As patient remain full scope of care with full code per son she was transferred to ICU and was intubated. - PCCM is going to take over her care at this point -Need continuation of goals of care discussion due to significant underlying debility and comorbidities  UTI (urinary tract infection) Present on admission Urine cultures with ESBL E. Coli. As patient is unable to explain symptoms and no leukocytosis we decided to give her 1 dose of fosfomycin  Multilobar lung infiltrate R > L, MRSA PCR positive -Management as above  Acute pulmonary embolism (HCC) Right lower lobe PE, lower extremity venous Doppler positive for extensive bilateral DVT, right greater than left - Vascular surgery was also consulted to see if she is a candidate for any procedure. - Patient is not a candidate for any procedure based on extremely debilitated state. - Heparin  was switched with Xarelto -need to switch back to heparin  as patient is now intubated  Right-sided sensory deficit present Patient does not know how long this has been going on Per son, patient is right handed CT head and MRI brain  negative for any acute abnormality  Hypokalemia Hypokalemia and hypomagnesemia improved but patient is having hypophosphatemia today. - Replete phosphorus and monitor electrolytes  Protein-calorie malnutrition, severe (HCC) Estimated body mass index is 16.55 kg/m as calculated from the following:   Height as of this encounter: 5' 5 (1.651 m).   Weight as of this encounter: 45.1 kg.   - Dietitian consult      Subjective: Patient appears lethargic and dusky with weak cough.  Seems like having a lot of respiratory secretions which she  was unable to clear.  Physical Exam: Vitals:   10/02/23 0755 10/02/23 0813 10/02/23 1221 10/02/23 1315  BP:  96/67 (!) 78/58   Pulse:  (!) 107 (!) 127   Resp:  20 (!) 35   Temp:  98.2 F (36.8 C)    TempSrc:  Oral    SpO2: 95% 99% 91%   Weight:    45.1 kg  Height:    5' 5 (1.651 m)   General.  Frail and severely malnourished elderly lady, appears dusky and lethargic. Pulmonary.  Junky chest with tachypnea and increased work of breathing CV.  Sinus tachycardia Abdomen.  Soft, nontender, nondistended, BS positive. CNS.  Awake but lethargic, no apparent neurodeficit Extremities.  No edema, no cyanosis, pulses intact and symmetrical.  Data Reviewed: Prior data reviewed  Family Communication: Discussed with son on phone  Disposition: Status is: Inpatient Remains inpatient appropriate because: Severity of illness  Planned Discharge Destination: Skilled nursing facility  DVT prophylaxis.  Xarelto -being switched to heparin  infusion Time spent: 60 minutes  This record has been created using Conservation officer, historic buildings. Errors have been sought and corrected,but may not always be located. Such creation errors do not reflect on the standard of care.   Author: Luna Salinas, MD 10/02/2023 1:26 PM  For on call review [email or web address].

## 2023-10-02 NOTE — Progress Notes (Signed)
 AT the bedside to place PICC line at 1755.  RN at bedside notified us  that MD had placed a CVC LIJ. No need for PICC line at this time. Secure chat sent to Dr Lucina Sabal and received.

## 2023-10-02 NOTE — Progress Notes (Signed)
 NAME:  Selena Pittman, MRN:  [id number], DOB:  [birth year], LOS: 4 ADMISSION DATE:  09/28/2023   History of Present Illness:  s. Axie Edgar is a 73 year old female with history of hypertension, on midodrine , GERD, insomnia, left breast cancer, anxiety, depression, hypothyroid, bipolar, who presents ED for chief concerns of hypotension and hypoxia at [facility] house.   Vitals in the ED showed t 98.3, respiration rate of 18, heart rate 112, blood pressure 83/64, improved to 99/67, SpO2 of 92% on 2 L nasal cannula.   Serum sodium is 139, potassium 3.0, chloride one 0.6, bicarb 23, BUN of 7, serum creatinine 0.35, EGFR greater than 60, nonfasting blood glucose 99, WBC 11.6, hemoglobin 12.2, platelets of 474.HS troponin is 38, on repeat is 49. COVID/influenza A/influenza B/RSV PCR were negative.Lactic acid is 1.3. Chest x-ray with concern of atypical pneumonia. CTA chest with small bilateral PE and bilateral infiltrate concerning for pneumonia CT head with no acute abnormality, age-related atrophy and moderate paranasal sinus disease.  CT abdomen and pelvis with concern of enterocolitis and fecal impaction in rectum.   ED treatment: Heparin  bolus and gtt., vancomycin , cefepime , sodium chloride  1.5 liter bolus, midodrine  2.5 mg p.o. one-time dose.  Pertinent  Medical History  As above  Significant Hospital Events: Including procedures, antibiotic start and stop dates in addition to other pertinent events   6/11: Vital stable, preliminary blood cultures negative.  MRI brain with no acute intracranial abnormality.  Mucosal thickening with air-fluid level in left sphenoid and maxillary sinuses more consistent with acute sinusitis. MRSA PCR positive so adding vancomycin . Lower extremity venous Doppler with extensive bilateral DVT, right greater than left.  Echocardiogram was normal. Vascular surgery was consulted to see if she is a candidate for any intervention or IVC filter placement.    6/12: Patient was little more lethargic this morning so ABG was done, it shows pH of 7.51, CO2 30 and mild hypoxia with pO2 of 65.  Placed on 2 L of oxygen.  Later she did woke up.  Procalcitonin negative so checking respiratory viral panel as he might be having viral pneumonia.  Urine cultures with ESBL E. coli-ordered 1 dose of fosfomycin. based on her debility-vascular evaluated her and she will not be a candidate for any intervention.  Heparin  is being switched with Xarelto . Chest PT ordered as she remained quite congested Mild hypokalemia and hypomagnesemia-replating electrolytes 6/13: Patient remained quite congested.  Respiratory viral panel positive for rhino and parainfluenza virus.  Mild hypophosphatemia today which is being repleted. 06/14:  Patient admitted to the ICU, intubated and bronched. Sputum sent for cultutre.  Objective    Blood pressure (!) 78/58, pulse (!) 127, temperature 98.2 F (36.8 C), temperature source Oral, resp. rate (!) 35, height 5' 5 (1.651 m), weight 45.1 kg, SpO2 91%.       No intake or output data in the 24 hours ending 10/02/23 1328 Filed Weights   09/28/23 1600 10/02/23 1315  Weight: 41.3 kg 45.1 kg    Examination: General: Chronically ill, cachectic , in moderate respiratory distress.  HENT: Supple neck, reactive pupils  Lungs: Diffuse ronchi appreciated Cardiovascular: Normal S1, Normal S2, RRR  Abdomen: Soft, non tender, non distended, + BS  Extremities: Warm and well perfused   Labs and imaging were reviewed.   Assessment and Plan  Case of a 73 years old female patient with a past medical history of failure to thrive, hypertension, GERD, insomnia, remote history of breast cancer who is presenting from [facility]  nursing home to [facility] with acute hypoxic respiratory failure on 09/28/2023.  She was found to have rhinovirus and parainfluenza virus positive.  Course complicated by worsening acute hypoxic respiratory failure requiring intubation  mechanical ventilation on 10/02/2023.  Status post bronchoscopy with extensive secretions bilaterally.  # Acute hypoxic respiratory failure requiring intubation and mechanical ventilation on 10/02/2023 secondary to.... # Rhinovirus and parainfluenza pneumonia with possible superimposed bacterial pneumonia.  MRSA swab positive. # Mucous plugging with extensive secretions secondary to the above #Shock likely distributive in the setting of the above.  # Extensive bilateral DVTs and PE (secondary to DVT due to immobilization and current inflammatory process) PE of low risk ft  # Failure to thrive # Bedbound at baseline # Hypertension, hyperlipidemia, insomnia  Neuro: Propofol  for RASS 0 to -1.  Fentanyl  for CPOT less than 2. CVS: Nor epi for MAP greater than 65. Heparin  drip.  Pulmonary: LPV with 6 cc/kg SpO2 > 90%, PH > 7.2. follow up BAL cultutre. Continue with Ceft Azithromycin  and vancomycin .  GI: NPO for now. PPI for prohylaxis,  Renal: Avoid nephrotoxic agents. Monitor UOP.  Heme: Heparin  drip for PE/DVT.  Endo: POC goal 140-180.    Best Practice (right click and Reselect all SmartList Selections daily)   Diet/type: NPO DVT prophylaxis systemic heparin  Pressure ulcer(s): N/A GI prophylaxis: PPI Lines: N/A Foley:  N/A Code Status:  full code  Critical care time: 60 minutes     [name], MD [facility] 10/02/2023 3:55 PM

## 2023-10-02 NOTE — Plan of Care (Signed)
  Palliative consult received.  Upon arrival to bedside, multiple staff assisting patient after rapid response called due to respiratory distress.  Advised by RN patient was tachycardic, hypotensive and tachypneic requiring nonrebreather.   Dr. Ariel Begun advises she contacted patient's son who requested full scope of care/ full code to include emergent intubation and transfer to ICU.  Plan to schedule family meeting with patient's son for further goals of care conversation.  No charge note.     Selena Pittman, Selena Pittman Palliative Medicine Team  10/02/2023 2:34 PM  Office [phone]  Pager [phone]

## 2023-10-02 NOTE — Progress Notes (Signed)
  Chaplain On-Call responded to Rapid Response notification at 1224 hours.  The patient was receiving bedside care from the RR medical team. The team is considering transporting the patient to the ICU.  Chaplain assured Staff of availability as needed.  Chaplain Dean Every., [facility]

## 2023-10-02 NOTE — Progress Notes (Signed)
 PHARMACY - ANTICOAGULATION CONSULT NOTE  Pharmacy Consult for heparin  Indication: pulmonary embolus  Allergies  Allergen Reactions   Cat Dander    Lactose Intolerance (Gi)     Diarrhea    Mixed Ragweed    Peanut-Containing Drug Products    Patient Measurements: Height: 5' 5 (165.1 cm) Weight: 45.1 kg (99 lb 6.8 oz) IBW/kg (Calculated) : 57 HEPARIN  DW (KG): 45.1  Vital Signs: Temp: 98.2 F (36.8 C) (06/14 0813) Temp Source: Oral (06/14 0813) BP: 78/58 (06/14 1221) Pulse Rate: 127 (06/14 1221)  Labs: Recent Labs    09/30/23 0703 10/01/23 1153  HGB 10.2* 11.5*  HCT 30.3* 34.1*  PLT 366 364  APTT 41*  --   HEPARINUNFRC >1.10*  --   CREATININE <0.30* 0.35*    Estimated Creatinine Clearance: 45.9 mL/min (A) (by C-G formula based on SCr of 0.35 mg/dL (L)).   Medical History: Past Medical History:  Diagnosis Date   Bipolar affective (HCC)    Breast cancer (HCC)    Cancer (HCC) 12/11/2014   INVASIVE MAMMARY CARCINOMA/.left breast/ T2 N0   Cardiomegaly    Depression    Emphysema of lung (HCC)    Endometriosis    Hyperlipidemia    Osteoporosis    Schizoaffective disorder (HCC)    Schizophrenia (HCC)    Severe sepsis (HCC) 04/02/2022   Thyroid  nodule    Vitamin D  deficiency    Assessment: 73 y/o female presenting with altered mental status and hypoxia. PMH significant for bipolar disorder, breast cancer, anxiety, depression, hypothyroidism. CT imaging shows pulmonary embolism.Per chart review, patient is not on anticoagulation prior to admission. Pharmacy has been consulted to re- initiate and manage heparin  infusion.  Patient started Xarelto  (last dose 06/14 0825). Now transitioning back to heparin  drip.  Goal of Therapy:  Heparin  level 0.3-0.7 units/ml aPTT 66 - 102s Monitor platelets by anticoagulation protocol: Yes    Plan:  ---Will begin heparin  infusion at 750 units/hr at what would be next Xarelto  dose (6/14 @ 2000) - f/u aPTT 8 hrs after drip  started -assess aPTT and HL for correlation (we will use aPTT to guide therapy until aPTT and heparin  level correlate) ---CBC daily while on heparin   Thank you for involving pharmacy in this patient's care.   Barney Boozer, PharmD, BCPS Clinical Pharmacist 10/02/2023

## 2023-10-02 NOTE — Assessment & Plan Note (Signed)
 Patient does not know how long this has been going on Per son, patient is right handed CT head and MRI brain  negative for any acute abnormality

## 2023-10-03 DIAGNOSIS — Z789 Other specified health status: Secondary | ICD-10-CM | POA: Diagnosis not present

## 2023-10-03 DIAGNOSIS — Z515 Encounter for palliative care: Secondary | ICD-10-CM | POA: Diagnosis not present

## 2023-10-03 DIAGNOSIS — Z7189 Other specified counseling: Secondary | ICD-10-CM | POA: Diagnosis not present

## 2023-10-03 DIAGNOSIS — J189 Pneumonia, unspecified organism: Secondary | ICD-10-CM | POA: Diagnosis not present

## 2023-10-03 DIAGNOSIS — J9601 Acute respiratory failure with hypoxia: Secondary | ICD-10-CM | POA: Diagnosis not present

## 2023-10-03 DIAGNOSIS — R531 Weakness: Secondary | ICD-10-CM | POA: Diagnosis not present

## 2023-10-03 DIAGNOSIS — A419 Sepsis, unspecified organism: Secondary | ICD-10-CM | POA: Diagnosis not present

## 2023-10-03 DIAGNOSIS — R579 Shock, unspecified: Secondary | ICD-10-CM | POA: Diagnosis not present

## 2023-10-03 DIAGNOSIS — A4101 Sepsis due to Methicillin susceptible Staphylococcus aureus: Secondary | ICD-10-CM | POA: Diagnosis not present

## 2023-10-03 LAB — BASIC METABOLIC PANEL WITH GFR
Anion gap: 9 (ref 5–15)
Anion gap: 8 (ref 5–15)
Anion gap: 7 (ref 5–15)
BUN: 5 mg/dL — ABNORMAL LOW (ref 8–23)
BUN: <5 mg/dL — ABNORMAL LOW (ref 8–23)
BUN: <5 mg/dL — ABNORMAL LOW (ref 8–23)
CO2: 21 mmol/L — ABNORMAL LOW (ref 22–32)
CO2: 20 mmol/L — ABNORMAL LOW (ref 22–32)
CO2: 21 mmol/L — ABNORMAL LOW (ref 22–32)
Calcium: 6.9 mg/dL — ABNORMAL LOW (ref 8.9–10.3)
Calcium: 6.6 mg/dL — ABNORMAL LOW (ref 8.9–10.3)
Calcium: 6.8 mg/dL — ABNORMAL LOW (ref 8.9–10.3)
Chloride: 109 mmol/L (ref 98–111)
Chloride: 107 mmol/L (ref 98–111)
Chloride: 109 mmol/L (ref 98–111)
Creatinine, Ser: <0.3 mg/dL — ABNORMAL LOW (ref 0.44–1.00)
Creatinine, Ser: <0.3 mg/dL — ABNORMAL LOW (ref 0.44–1.00)
Creatinine, Ser: <0.3 mg/dL — ABNORMAL LOW (ref 0.44–1.00)
Glucose, Bld: 147 mg/dL — ABNORMAL HIGH (ref 70–99)
Glucose, Bld: 213 mg/dL — ABNORMAL HIGH (ref 70–99)
Glucose, Bld: 198 mg/dL — ABNORMAL HIGH (ref 70–99)
Potassium: 2.4 mmol/L — CL (ref 3.5–5.1)
Potassium: 3.8 mmol/L (ref 3.5–5.1)
Potassium: 3.4 mmol/L — ABNORMAL LOW (ref 3.5–5.1)
Sodium: 139 mmol/L (ref 135–145)
Sodium: 135 mmol/L (ref 135–145)
Sodium: 137 mmol/L (ref 135–145)

## 2023-10-03 LAB — RENAL FUNCTION PANEL
Albumin: <1.5 g/dL — ABNORMAL LOW (ref 3.5–5.0)
Anion gap: 8 (ref 5–15)
BUN: 5 mg/dL — ABNORMAL LOW (ref 8–23)
CO2: 22 mmol/L (ref 22–32)
Calcium: 6.9 mg/dL — ABNORMAL LOW (ref 8.9–10.3)
Chloride: 109 mmol/L (ref 98–111)
Creatinine, Ser: <0.3 mg/dL — ABNORMAL LOW (ref 0.44–1.00)
Glucose, Bld: 154 mg/dL — ABNORMAL HIGH (ref 70–99)
Phosphorus: 1.7 mg/dL — ABNORMAL LOW (ref 2.5–4.6)
Potassium: 2.5 mmol/L — CL (ref 3.5–5.1)
Sodium: 139 mmol/L (ref 135–145)

## 2023-10-03 LAB — BLOOD GAS, ARTERIAL
Acid-base deficit: 1.5 mmol/L (ref 0.0–2.0)
Bicarbonate: 21.1 mmol/L (ref 20.0–28.0)
FIO2: 40 %
MECHVT: 330 mL
Mechanical Rate: 20
O2 Saturation: 100 %
PEEP: 5 cmH2O
Patient temperature: 37
pCO2 arterial: 29 mmHg — ABNORMAL LOW (ref 32–48)
pH, Arterial: 7.47 — ABNORMAL HIGH (ref 7.35–7.45)
pO2, Arterial: 175 mmHg — ABNORMAL HIGH (ref 83–108)

## 2023-10-03 LAB — CBC
HCT: 30.3 % — ABNORMAL LOW (ref 36.0–46.0)
Hemoglobin: 10.4 g/dL — ABNORMAL LOW (ref 12.0–15.0)
MCH: 31.1 pg (ref 26.0–34.0)
MCHC: 34.3 g/dL (ref 30.0–36.0)
MCV: 90.7 fL (ref 80.0–100.0)
Platelets: 382 10*3/uL (ref 150–400)
RBC: 3.34 MIL/uL — ABNORMAL LOW (ref 3.87–5.11)
RDW: 17.3 % — ABNORMAL HIGH (ref 11.5–15.5)
WBC: 19.6 10*3/uL — ABNORMAL HIGH (ref 4.0–10.5)
nRBC: 0 % (ref 0.0–0.2)

## 2023-10-03 LAB — CULTURE, BLOOD (ROUTINE X 2)
Culture: NO GROWTH
Culture: NO GROWTH

## 2023-10-03 LAB — GLUCOSE, CAPILLARY
Glucose-Capillary: 131 mg/dL — ABNORMAL HIGH (ref 70–99)
Glucose-Capillary: 142 mg/dL — ABNORMAL HIGH (ref 70–99)
Glucose-Capillary: 156 mg/dL — ABNORMAL HIGH (ref 70–99)
Glucose-Capillary: 161 mg/dL — ABNORMAL HIGH (ref 70–99)
Glucose-Capillary: 172 mg/dL — ABNORMAL HIGH (ref 70–99)
Glucose-Capillary: 172 mg/dL — ABNORMAL HIGH (ref 70–99)
Glucose-Capillary: 174 mg/dL — ABNORMAL HIGH (ref 70–99)

## 2023-10-03 LAB — PHOSPHORUS
Phosphorus: 2.5 mg/dL (ref 2.5–4.6)
Phosphorus: 3.6 mg/dL (ref 2.5–4.6)
Phosphorus: 2.4 mg/dL — ABNORMAL LOW (ref 2.5–4.6)

## 2023-10-03 LAB — LACTIC ACID, PLASMA: Lactic Acid, Venous: 1.2 mmol/L (ref 0.5–1.9)

## 2023-10-03 LAB — MAGNESIUM
Magnesium: 1.9 mg/dL (ref 1.7–2.4)
Magnesium: 2.3 mg/dL (ref 1.7–2.4)
Magnesium: 2.3 mg/dL (ref 1.7–2.4)
Magnesium: 2.3 mg/dL (ref 1.7–2.4)

## 2023-10-03 LAB — APTT
aPTT: 133 s — ABNORMAL HIGH (ref 24–36)
aPTT: 63 s — ABNORMAL HIGH (ref 24–36)
aPTT: 75 s — ABNORMAL HIGH (ref 24–36)

## 2023-10-03 LAB — TRIGLYCERIDES: Triglycerides: 133 mg/dL (ref <150)

## 2023-10-03 LAB — HEPARIN LEVEL (UNFRACTIONATED): Heparin Unfractionated: >1.1 [IU]/mL — ABNORMAL HIGH (ref 0.30–0.70)

## 2023-10-03 LAB — STREP PNEUMONIAE URINARY ANTIGEN: Strep Pneumo Urinary Antigen: NEGATIVE

## 2023-10-03 LAB — PROCALCITONIN: Procalcitonin: 2.93 ng/mL

## 2023-10-03 MED ORDER — CLOZAPINE 100 MG PO TABS
200.0000 mg | ORAL_TABLET | Freq: Every day | ORAL | Status: DC
  Administered 2023-10-05 – 2023-10-10 (×5): 200 mg
  Filled 2023-10-03 (×5): qty 2

## 2023-10-03 MED ORDER — ZINC OXIDE 40 % EX OINT
TOPICAL_OINTMENT | CUTANEOUS | Status: DC | PRN
  Administered 2023-10-05: 1 via TOPICAL
  Filled 2023-10-03 (×4): qty 113

## 2023-10-03 MED ORDER — ALBUMIN HUMAN 25 % IV SOLN
12.5000 g | Freq: Once | INTRAVENOUS | Status: AC
  Administered 2023-10-03: 12.5 g via INTRAVENOUS
  Filled 2023-10-03: qty 50

## 2023-10-03 MED ORDER — MUPIROCIN 2 % EX OINT
1.0000 | TOPICAL_OINTMENT | Freq: Two times a day (BID) | CUTANEOUS | Status: DC
  Administered 2023-10-03 – 2023-10-14 (×23): 1 via NASAL
  Filled 2023-10-03: qty 22

## 2023-10-03 MED ORDER — POTASSIUM CHLORIDE 10 MEQ/50ML IV SOLN
10.0000 meq | INTRAVENOUS | Status: DC
  Administered 2023-10-03 (×2): 10 meq via INTRAVENOUS
  Filled 2023-10-03 (×6): qty 50

## 2023-10-03 MED ORDER — FREE WATER
30.0000 mL | Status: DC
  Administered 2023-10-03 – 2023-10-12 (×53): 30 mL

## 2023-10-03 MED ORDER — HYDROCORTISONE SOD SUC (PF) 100 MG IJ SOLR
100.0000 mg | Freq: Three times a day (TID) | INTRAMUSCULAR | Status: DC
  Administered 2023-10-03 – 2023-10-11 (×25): 100 mg via INTRAVENOUS
  Filled 2023-10-03 (×25): qty 2

## 2023-10-03 MED ORDER — MAGNESIUM SULFATE 2 GM/50ML IV SOLN
2.0000 g | Freq: Once | INTRAVENOUS | Status: AC
  Administered 2023-10-03: 2 g via INTRAVENOUS
  Filled 2023-10-03: qty 50

## 2023-10-03 MED ORDER — VITAL AF 1.2 CAL PO LIQD
1000.0000 mL | ORAL | Status: DC
  Administered 2023-10-03 – 2023-10-04 (×2): 1000 mL

## 2023-10-03 MED ORDER — SODIUM CHLORIDE 0.9 % IV SOLN
1.0000 g | Freq: Three times a day (TID) | INTRAVENOUS | Status: AC
  Administered 2023-10-03 – 2023-10-07 (×14): 1 g via INTRAVENOUS
  Filled 2023-10-03 (×14): qty 20

## 2023-10-03 MED ORDER — PROSOURCE TF20 ENFIT COMPATIBL EN LIQD
60.0000 mL | Freq: Every day | ENTERAL | Status: DC
  Filled 2023-10-03: qty 60

## 2023-10-03 MED ORDER — SODIUM CHLORIDE 0.9 % IV SOLN: INTRAVENOUS | Status: AC | PRN

## 2023-10-03 MED ORDER — VITAL HIGH PROTEIN PO LIQD: 1000.0000 mL | ORAL | Status: DC

## 2023-10-03 MED ORDER — SODIUM CHLORIDE 0.9 % IV SOLN: 2.0000 g | INTRAVENOUS | Status: DC

## 2023-10-03 MED ORDER — POTASSIUM PHOSPHATES 15 MMOLE/5ML IV SOLN
30.0000 mmol | Freq: Once | INTRAVENOUS | Status: AC
  Administered 2023-10-03: 30 mmol via INTRAVENOUS
  Filled 2023-10-03: qty 10

## 2023-10-03 MED ORDER — POTASSIUM PHOSPHATES 15 MMOLE/5ML IV SOLN
30.0000 mmol | Freq: Once | INTRAVENOUS | Status: AC
  Administered 2023-10-04: 30 mmol via INTRAVENOUS
  Filled 2023-10-03: qty 10

## 2023-10-03 MED ORDER — VASOPRESSIN 20 UNITS/100 ML INFUSION FOR SHOCK
0.0000 [IU]/min | INTRAVENOUS | Status: DC
  Administered 2023-10-03 – 2023-10-05 (×8): 0.04 [IU]/min via INTRAVENOUS
  Administered 2023-10-06: 0.03 [IU]/min via INTRAVENOUS
  Administered 2023-10-06 – 2023-10-10 (×12): 0.04 [IU]/min via INTRAVENOUS
  Filled 2023-10-03 (×22): qty 100

## 2023-10-03 MED ORDER — VANCOMYCIN HCL 500 MG/100ML IV SOLN
500.0000 mg | INTRAVENOUS | Status: DC
  Administered 2023-10-03: 500 mg via INTRAVENOUS
  Filled 2023-10-03 (×2): qty 100

## 2023-10-03 NOTE — Procedures (Signed)
 ARTERIAL CATHETER INSERTION PROCEDURE NOTE  Dasiah Obar  [id number]  [birth year]  Date:10/03/23  Time:5:26 AM   Provider Performing: Steffi Edu   Procedure: Insertion of Arterial Line (62952) with US  guidance (84132)   Indication(s) Blood pressure monitoring and/or need for frequent ABGs  Consent Unable to obtain consent due to emergent nature of procedure.  Anesthesia None  Time Out Verified patient identification, verified procedure, site/side was marked, verified correct patient position, special equipment/implants available, medications/allergies/relevant history reviewed, required imaging and test results available.  Sterile Technique Maximal sterile technique including full sterile barrier drape, hand hygiene, sterile gown, sterile gloves, mask, hair covering, sterile ultrasound probe cover (if used).  Procedure Description Area of catheter insertion was cleaned with chlorhexidine  and draped in sterile fashion. With real-time ultrasound guidance an arterial catheter was placed into the left radial artery.  Appropriate arterial tracings confirmed on monitor.    Complications/Tolerance None; patient tolerated the procedure well.  EBL Minimal  Specimen(s) None   Alonza Arthurs, DNP, CCRN, FNP-C, AGACNP-BC Acute Care & Family Nurse Practitioner  [facility]  See Amion for personal pager PCCM on call pager [phone] until 7 am

## 2023-10-03 NOTE — Progress Notes (Addendum)
 Nutrition Follow-up  DOCUMENTATION CODES:   Underweight, Severe malnutrition in context of chronic illness  INTERVENTION:   -TF via OGT:   Per discussion with MD, trickle feeds today with plans to advance on 10/04/23:   Vital 1.2 @ 20 ml/hr  30 ml free water flush every 4 hours  Once able to advance, increase by 10 ml every 8 hours to goal rate of 35 ml/hr.   60 ml Prosource TF daily  Tube feeding regimen provides 1088 kcal (100% of needs), 83 grams of protein, and 681 ml of H2O.  Total free water: 861 ml daily  -Monitor Mg, K, and Phos and replete as needed secondary to high refeeding risk -Continue MVI with minerals daily via tube -Continue 100 mg thiamine  daily via tube -Continue 500 mg vitamin C  BID via tube -1 packet Juven BID, each packet provides 95 calories, 2.5 grams of protein (collagen), and 9.8 grams of carbohydrate (3 grams sugar); also contains 7 grams of L-arginine and L-glutamine, 300 mg vitamin C , 15 mg vitamin E, 1.2 mcg vitamin B-12, 9.5 mg zinc , 200 mg calcium , and 1.5 g  Calcium  Beta-hydroxy-Beta-methylbutyrate to support wound healing  -D/c Boost Breeze  NUTRITION DIAGNOSIS:   Severe Malnutrition related to social / environmental circumstances as evidenced by severe fat depletion, severe muscle depletion, percent weight loss.  Ongoing  GOAL:   Patient will meet greater than or equal to 90% of their needs  Progressing   MONITOR:   PO intake, Supplement acceptance  REASON FOR ASSESSMENT:   Consult Assessment of nutrition requirement/status  ASSESSMENT:   Pt with history of hypertension, on midodrine , GERD, insomnia, left breast cancer, anxiety, depression, hypothyroid, bipolar, who presents for chief concerns of hypotension and hypoxia at [facility] house.  6/14- rapid response called, intubated, s/p brochoscopy  Patient is currently intubated on ventilator support. OGT currently to connected to low, intermittent suction. Per x-ray on 10/02/23;  tip of OGT and side port in stomach.  MV: 6.1 L/min Temp (24hrs), Avg:97.2 F (36.2 C), Min:95 F (35 C), Max:98.2 F (36.8 C)  Propofol : 4.96 ml/hr (provides 131 kcals daily)  Reviewed I/O's: +2.8 L x 24 hours and +4.3 L since admission  UOP: 350 ml x 24 hours   OGT output: 0 ml x 24 hours  Case discussed with RN and MD; clarified plan to start trickle feeds today and plans to advance feeds tomorrow (10/04/23).  Palliative care following for goals of care discussions. Per PCCM discussion with son, desiring full scope care. Court hearing for legal guardianship has been scheduled.   Medications reviewed and include vitamin C , solu-cortef, levophed , potassium phosphate , and pitressin.   Labs reviewed: K: 2.4 (on supplementation), Phos: 1.7 (on supplementation), CBGS: 131.    Diet Order:   Diet Order             Diet NPO time specified  Diet effective now                   EDUCATION NEEDS:   Education needs have been addressed  Skin:  Skin Assessment: Skin Integrity Issues: Skin Integrity Issues:: Stage II Stage II: sacrum  Last BM:  Unknown  Height:   Ht Readings from Last 1 Encounters:  10/02/23 5' 5 (1.651 m)    Weight:   Wt Readings from Last 1 Encounters:  10/02/23 45.1 kg    Ideal Body Weight:  56.8 kg  BMI:  Body mass index is 16.55 kg/m.  Estimated Nutritional Needs:  Kcal:  1650-1850  Protein:  85-100 grams  Fluid:  1.6-1.8 L    Herschel Lords, RD, LDN, CDCES Registered Dietitian III Certified Diabetes Care and Education Specialist If unable to reach this RD, please use RD Inpatient group chat on secure chat between hours of 8am-4 pm daily

## 2023-10-03 NOTE — Progress Notes (Signed)
 PHARMACY - ANTICOAGULATION CONSULT NOTE  Pharmacy Consult for heparin  Indication: pulmonary embolus  Allergies  Allergen Reactions   Cat Dander    Lactose Intolerance (Gi)     Diarrhea    Mixed Ragweed    Peanut-Containing Drug Products    Patient Measurements: Height: 5' 5 (165.1 cm) Weight: 45.1 kg (99 lb 6.8 oz) IBW/kg (Calculated) : 57 HEPARIN  DW (KG): 45.1  Vital Signs: Temp: 97.5 F (36.4 C) (06/15 0600) Temp Source: Esophageal (06/15 0400) BP: 84/63 (06/15 0600) Pulse Rate: 113 (06/15 0600)  Labs: Recent Labs    09/30/23 0703 10/01/23 1153 10/03/23 0412 10/03/23 0527  HGB 10.2* 11.5*  --  10.4*  HCT 30.3* 34.1*  --  30.3*  PLT 366 364  --  382  APTT 41*  --  133*  --   HEPARINUNFRC >1.10*  --  >1.10*  --   CREATININE <0.30* 0.35* <0.30* <0.30*    CrCl cannot be calculated (This lab value cannot be used to calculate CrCl because it is not a number: <0.30).   Medical History: Past Medical History:  Diagnosis Date   Bipolar affective (HCC)    Breast cancer (HCC)    Cancer (HCC) 12/11/2014   INVASIVE MAMMARY CARCINOMA/.left breast/ T2 N0   Cardiomegaly    Depression    Emphysema of lung (HCC)    Endometriosis    Hyperlipidemia    Osteoporosis    Schizoaffective disorder (HCC)    Schizophrenia (HCC)    Severe sepsis (HCC) 04/02/2022   Thyroid  nodule    Vitamin D  deficiency    Assessment: 73 y/o female presenting with altered mental status and hypoxia. PMH significant for bipolar disorder, breast cancer, anxiety, depression, hypothyroidism. CT imaging shows pulmonary embolism.Per chart review, patient is not on anticoagulation prior to admission. Pharmacy has been consulted to re- initiate and manage heparin  infusion.  Patient started Xarelto  (last dose 06/14 0825). Now transitioned back to heparin  drip.  Goal of Therapy:  Heparin  level 0.3-0.7 units/ml aPTT 66 - 102s Monitor platelets by anticoagulation protocol: Yes  06/15 0412 aPTT 133,  supratherapeutic / HL >1.1   Plan: aPTT slightly subtherapeutic ---increase heparin  infusion to 650 units/hr --- f/u aPTT 8 hrs after after rate change -assess aPTT and HL for correlation (we will use aPTT to guide therapy until aPTT and heparin  level correlate) ---CBC daily while on heparin   Thank you for involving pharmacy in this patient's care.   Barney Boozer, PharmD, BCPS 10/03/2023 7:00 AM

## 2023-10-03 NOTE — Consult Note (Signed)
 Pharmacy Antibiotic Note  Selena Pittman is a 73 y.o. female admitted on 09/28/2023 with sepsis secondary to pneumonia and UTI and also found to have a PE. Patient received treatment for 5 days for pneumonia already with azithromycin , ceftriaxone  and vancomycin . ESBL in urine. Despite prior treatment, patient has decompensated and is now in the ICU on vasopressors. Pharmacy has been consulted for meropenem  dosing.  Plan:  Meropenem  1 g IV q8h  Height: 5' 5 (165.1 cm) Weight: 45.1 kg (99 lb 6.8 oz) IBW/kg (Calculated) : 57  Temp (24hrs), Avg:96.6 F (35.9 C), Min:95 F (35 C), Max:98.2 F (36.8 C)  Recent Labs  Lab 09/28/23 1132 09/28/23 1248 09/29/23 0145 09/29/23 0501 09/30/23 0703 10/01/23 1153 10/03/23 0412 10/03/23 0527  WBC 11.6*  --  8.8 10.4 4.6 11.0*  --  19.6*  CREATININE  --    < >  --  <0.30* <0.30* 0.35* <0.30* <0.30*  LATICACIDVEN 1.3  --   --   --   --   --   --  1.2   < > = values in this interval not displayed.    CrCl cannot be calculated (This lab value cannot be used to calculate CrCl because it is not a number: <0.30).    Allergies  Allergen Reactions   Cat Dander    Lactose Intolerance (Gi)     Diarrhea    Mixed Ragweed    Peanut-Containing Drug Products     Antimicrobials this admission: Cefepime  6/10 x 1 Metronidazole  6/10 x 1 Azithromycin  6/10 >> 6/13 Ceftriaxone  6/11 >> 6/14 Vancomycin  6/10 >> 6/14 Fosfomycin 6/13 x 1 Meropenem  6/15 >>   Dose adjustments this admission: N/A  Microbiology results: 6/10 BCx: NG 6/10 UCx: ESBL E coli  6/11 MRSA PCR: (+) 6/12 RVP: (+) Rhinovirus, (+) Parainfluenza virus 6/14 Sputum: Pending    Thank you for allowing pharmacy to be a part of this patient's care.  Selena Pittman 10/03/2023 8:02 AM

## 2023-10-03 NOTE — Plan of Care (Signed)
  Problem: Clinical Measurements: Goal: Diagnostic test results will improve Outcome: Progressing Goal: Cardiovascular complication will be avoided Outcome: Progressing   Problem: Nutrition: Goal: Adequate nutrition will be maintained Outcome: Progressing   Problem: Elimination: Goal: Will not experience complications related to bowel motility Outcome: Progressing   Problem: Pain Managment: Goal: General experience of comfort will improve and/or be controlled Outcome: Progressing   Problem: Safety: Goal: Ability to remain free from injury will improve Outcome: Progressing

## 2023-10-03 NOTE — Progress Notes (Signed)
 PHARMACY - ANTICOAGULATION CONSULT NOTE  Pharmacy Consult for heparin  Indication: pulmonary embolus  Allergies  Allergen Reactions   Cat Dander    Lactose Intolerance (Gi)     Diarrhea    Mixed Ragweed    Peanut-Containing Drug Products    Patient Measurements: Height: 5' 5 (165.1 cm) Weight: 45.1 kg (99 lb 6.8 oz) IBW/kg (Calculated) : 57 HEPARIN  DW (KG): 45.1  Vital Signs: Temp: 98.1 F (36.7 C) (06/15 0200) Temp Source: Esophageal (06/14 1914) BP: 82/65 (06/15 0200) Pulse Rate: 112 (06/15 0200)  Labs: Recent Labs    09/30/23 0703 10/01/23 1153 10/03/23 0412  HGB 10.2* 11.5*  --   HCT 30.3* 34.1*  --   PLT 366 364  --   APTT 41*  --  133*  HEPARINUNFRC >1.10*  --  >1.10*  CREATININE <0.30* 0.35* <0.30*    CrCl cannot be calculated (This lab value cannot be used to calculate CrCl because it is not a number: <0.30).   Medical History: Past Medical History:  Diagnosis Date   Bipolar affective (HCC)    Breast cancer (HCC)    Cancer (HCC) 12/11/2014   INVASIVE MAMMARY CARCINOMA/.left breast/ T2 N0   Cardiomegaly    Depression    Emphysema of lung (HCC)    Endometriosis    Hyperlipidemia    Osteoporosis    Schizoaffective disorder (HCC)    Schizophrenia (HCC)    Severe sepsis (HCC) 04/02/2022   Thyroid  nodule    Vitamin D  deficiency    Assessment: 73 y/o female presenting with altered mental status and hypoxia. PMH significant for bipolar disorder, breast cancer, anxiety, depression, hypothyroidism. CT imaging shows pulmonary embolism.Per chart review, patient is not on anticoagulation prior to admission. Pharmacy has been consulted to re- initiate and manage heparin  infusion.  Patient started Xarelto  (last dose 06/14 0825). Now transitioning back to heparin  drip.  Goal of Therapy:  Heparin  level 0.3-0.7 units/ml aPTT 66 - 102s Monitor platelets by anticoagulation protocol: Yes  06/15 0412 aPTT 133, supratherapeutic / HL >1.1   Plan:  ---Decrease  heparin  infusion to 600 units/hr --- f/u aPTT 8 hrs after after rate change -assess aPTT and HL for correlation (we will use aPTT to guide therapy until aPTT and heparin  level correlate) ---CBC daily while on heparin   Thank you for involving pharmacy in this patient's care.   Coretta Dexter, PharmD, [facility] 10/03/2023 5:09 AM

## 2023-10-03 NOTE — Progress Notes (Signed)
 PHARMACY CONSULT NOTE - FOLLOW UP  Pharmacy Consult for Electrolyte Monitoring and Replacement   Recent Labs: Potassium (mmol/L)  Date Value  10/03/2023 2.4 (LL)  12/31/2014 4.0   Magnesium  (mg/dL)  Date Value  16/01/9603 1.9   Calcium  (mg/dL)  Date Value  54/12/8117 6.9 (L)   Albumin  (g/dL)  Date Value  14/78/2956 <1.5 (L)   Phosphorus (mg/dL)  Date Value  21/30/8657 1.7 (L)   Sodium (mmol/L)  Date Value  10/03/2023 139     Assessment: 73 y/o female presenting with altered mental status and hypoxia. PMH significant for bipolar disorder, breast cancer, anxiety, depression, hypothyroidism. Pharmacy is asked to follow and replace electrolytes while in CCU  Goal of Therapy:  Electrolytes WNL  Plan:  ---2 grams IV magnesium  sulfate x 1 per NP ---10 mEq IV KCl x 2 per NP ---30 mmol IV potassium phosphate  x 1 ---recheck potassium when above completed and again in am  Adalberto Acton ,PharmD Clinical Pharmacist 10/03/2023 7:00 AM

## 2023-10-03 NOTE — Consult Note (Signed)
 Consultation Note Date: 10/03/2023 at 1145  Patient Name: Selena Pittman  DOB: [birth year]  MRN: [id number]  Age / Sex: 73 y.o., female  PCP: Sharyne Degree, FNP Referring Physician: [name], MD  HPI/Patient Profile: 74 y.o. female  with past medical history significant for hypotension on midodrine , HLD, left breast cancer, bipolar, anxiety, depression, schizophrenia, schizoaffective disorder, and previous suicide attempt.  She presented to ED via EMS 09/28/2023 from [facility] healthcare complaining of lethargy, hypotension and hypoxia.  EMS reported patient had temp of 99.6 axillary and worsening cough. ED workup showed Na+ 139, K+ 3.0, bicarb 23, BUN 7, creatinine 0.35, GFR > 60, WBC 11.6, Hgb 12.2, platelets 474, troponin 38 => 49.  COVID/flu A-B/RSV negative.  Lactic 1.3. BP 99/67, HR 112, RR 18, SpO2 92% on 2 L O2 and 98.3.  Patient was admitted to [facility] for management of severe sepsis 2/2 PNA, pyuria, multilobar lung infiltrate, acute PE and severe protein calorie malnutrition.  PMT was consulted for assistance with goals of care discussion.  Patient for status continue to worsen leading to rapid response being called 10/02/2023 requiring emergent intubation and transfer to ICU.  PMT was unable to complete consult due to this event.  Dr. Lucina Sabal had long discussion with family at the bedside including CODE STATUS and prognosis 10/02/2023.  Selena Pittman (son) at that time reports he was not comfortable making patient a DNR and wanted to exhaust all options.  Clinical Assessment and Goals of Care: Extensive chart review completed prior to meeting patient including labs, vital signs, imaging, progress notes, orders, and available advanced directive documents from current and previous encounters. I assessed the patient at the bedside and then contacted Selena Pittman (son) via phone to discuss diagnosis  prognosis, GOC, EOL wishes, disposition and options.  Ill-appearing, elderly female lying in bed.  Frail, cachectic.  Sedated, intubated.  I introduced Palliative Medicine as specialized medical care for people living with serious illness. It focuses on providing relief from the symptoms and stress of a serious illness. The goal is to improve quality of life for both the patient and the family.   We discussed a brief life review of the patient.  Selena Pittman shares that he has been the legal guardian for his mother since 2002 after a suicide attempt.  He shares that she has suffered from mental illness and had episodes of confusion that are lifelong. [name] has resided in numerous care facilities since 2002.  She was able to ambulate upon arrival to [facility] healthcare approximately 10 months ago.  He shares that she gradually did not getting out of bed and staff started using diapers instead.  He reports visiting approximately once a month due to distance from facility to his home.  He feels that she was eating well stating when he visited with at least 50% of meal tray eaten but endorses significant weight loss over the past 20 years.  Selena Pittman shares when he visited his mother on Thursday prior to coming to ED, she complained of being really tired.  We discussed patient's current illness and what it means in the larger context of patient's on-going co-morbidities.  Natural disease trajectory and expectations at EOL were discussed.  Selena Pittman understands his mother is critically ill requiring life-saving medications and mechanical ventilation.  Discussed poor response to current therapy.  I attempted to elicit values and goals of care important to the patient.  Selena Pittman reports his goal is for his mother to be able to come off life support and talk to him about her wishes.  He shares she previously told him if she were to become ill do everything he can.  The difference between aggressive medical  intervention and comfort care was considered in light of the patient's goals of care.   Advance directives, concepts specific to code status, artificial feeding and hydration, and rehospitalization were considered and discussed.  Selena Pittman shares he wants all medical interventions done for his mother.  Education offered regarding concept specific to human mortality and the limitations of medical interventions to prolong life when the body begins to fail to thrive.  Family is facing treatment option decisions, advanced directive, and anticipatory care needs.  Selena Pittman shares that he is currently his mother's legal guardian but has an appointment this week with [facility] to transfer his legal guardianship.  He feels that with his wife's recently diagnosed stage IV breast cancer being legal guardian for his mother is too stressful for him and his family.  He questions whether transfer guardianship should be done at this time in light of his mother's critical illness.  Selena Pittman requests that his sister Pearson Bounds be involved in decision-making and gives permission for her to receive medical information and be called for updates and questions regarding his mother's care.  Selena Pittman shares he and Burdette Carolin are in agreement with current care plan and share the same goals for their mother.  Discussed with Selena Pittman the importance of continued conversation with family and the medical providers regarding overall plan of care and treatment options, ensuring decisions are within the context of the patient's values and GOCs.    Questions and concerns were addressed. The family was encouraged to call with questions or concerns.   Primary Decision Maker LEGAL Charlynne Coombes  Physical Exam Vitals reviewed.  Constitutional:      General: She is not in acute distress.    Appearance: She is ill-appearing.  HENT:     Head: Normocephalic and atraumatic.  Pulmonary:     Effort: No respiratory distress.      Comments: Sedated, mechanically ventilated  Skin:    General: Skin is warm and dry.   Recommendations/Plan: FULL CODE status as previously documented    Continue current supportive interventions Disposition TBD  Palliative Assessment/Data: 10-20%   Discussed plan of care with primary RN, Sherline Distel  Thank you for this consult. Palliative medicine will continue to follow and assist holistically.   Time Total: 75 minutes  Time spent includes: Detailed review of medical records (labs, imaging, vital signs), medically appropriate exam (mental status, respiratory, cardiac, skin), discussed with treatment team, counseling and educating patient, family and staff, documenting clinical information, medication management and coordination of care.     Ina Manas, [facility] Palliative Medicine Team  10/03/2023 9:14 AM  Office [phone]  Pager [phone]     Please contact Palliative Medicine Team providers via AMION for questions and concerns.

## 2023-10-03 NOTE — Plan of Care (Signed)
  Problem: Clinical Measurements: Goal: Will remain free from infection Outcome: Progressing   Problem: Education: Goal: Knowledge of General Education information will improve Description: Including pain rating scale, medication(s)/side effects and non-pharmacologic comfort measures Outcome: Not Progressing   Problem: Clinical Measurements: Goal: Ability to maintain clinical measurements within normal limits will improve Outcome: Not Progressing Goal: Respiratory complications will improve Outcome: Not Progressing   Problem: Activity: Goal: Risk for activity intolerance will decrease Outcome: Not Progressing   Problem: Nutrition: Goal: Adequate nutrition will be maintained Outcome: Not Progressing   Problem: Coping: Goal: Level of anxiety will decrease Outcome: Not Progressing

## 2023-10-03 NOTE — Progress Notes (Addendum)
 NAME:  Selena Pittman, MRN:  [id number], DOB:  15-Jun-1950, LOS: 5 ADMISSION DATE:  09/28/2023   History of Present Illness:  s. Selena Pittman is a 73 year old female with history of hypertension, on midodrine , GERD, insomnia, left breast cancer, anxiety, depression, hypothyroid, bipolar, who presents ED for chief concerns of hypotension and hypoxia at [facility] house.   Vitals in the ED showed t 98.3, respiration rate of 18, heart rate 112, blood pressure 83/64, improved to 99/67, SpO2 of 92% on 2 L nasal cannula.   Serum sodium is 139, potassium 3.0, chloride one 0.6, bicarb 23, BUN of 7, serum creatinine 0.35, EGFR greater than 60, nonfasting blood glucose 99, WBC 11.6, hemoglobin 12.2, platelets of 474.HS troponin is 38, on repeat is 49. COVID/influenza A/influenza B/RSV PCR were negative.Lactic acid is 1.3. Chest x-ray with concern of atypical pneumonia. CTA chest with small bilateral PE and bilateral infiltrate concerning for pneumonia CT head with no acute abnormality, age-related atrophy and moderate paranasal sinus disease.  CT abdomen and pelvis with concern of enterocolitis and fecal impaction in rectum.   ED treatment: Heparin  bolus and gtt., vancomycin , cefepime , sodium chloride  1.5 liter bolus, midodrine  2.5 mg p.o. one-time dose.  Pertinent  Medical History  As above  Significant Hospital Events: Including procedures, antibiotic start and stop dates in addition to other pertinent events   6/11: Vital stable, preliminary blood cultures negative.  MRI brain with no acute intracranial abnormality.  Mucosal thickening with air-fluid level in left sphenoid and maxillary sinuses more consistent with acute sinusitis. MRSA PCR positive so adding vancomycin . Lower extremity venous Doppler with extensive bilateral DVT, right greater than left.  Echocardiogram was normal. Vascular surgery was consulted to see if she is a candidate for any intervention or IVC filter placement.    6/12: Patient was little more lethargic this morning so ABG was done, it shows pH of 7.51, CO2 30 and mild hypoxia with pO2 of 65.  Placed on 2 L of oxygen.  Later she did woke up.  Procalcitonin negative so checking respiratory viral panel as he might be having viral pneumonia.  Urine cultures with ESBL E. coli-ordered 1 dose of fosfomycin. based on her debility-vascular evaluated her and she will not be a candidate for any intervention.  Heparin  is being switched with Xarelto . Chest PT ordered as she remained quite congested Mild hypokalemia and hypomagnesemia-replating electrolytes 6/13: Patient remained quite congested.  Respiratory viral panel positive for rhino and parainfluenza virus.  Mild hypophosphatemia today which is being repleted. 06/14:  Patient admitted to the ICU, intubated and bronched. Sputum sent for cultutre. 10/03/23: WBC trending up. ESBL in urine culture. Severe hypokalemia. Persistent shock.   Subjective Patient intubated and sedated.   Objective    Blood pressure (!) 84/63, pulse (!) 113, temperature (!) 97.5 F (36.4 C), resp. rate 15, height 5' 5 (1.651 m), weight 45.1 kg, SpO2 100%.    Vent Mode: PRVC FiO2 (%):  [40 %-50 %] 40 % Set Rate:  [18 bmp-20 bmp] 20 bmp Vt Set:  [330 mL] 330 mL PEEP:  [5 cmH20] 5 cmH20 Plateau Pressure:  [10 cmH20-14 cmH20] 14 cmH20   Intake/Output Summary (Last 24 hours) at 10/03/2023 0742 Last data filed at 10/03/2023 0657 Gross per 24 hour  Intake 3177.06 ml  Output 350 ml  Net 2827.06 ml   Filed Weights   09/28/23 1600 10/02/23 1315  Weight: 41.3 kg 45.1 kg    Examination: General: Chronically ill, cachectic , intubated and sedated.  HENT: Supple neck, reactive pupils  Lungs: Diffuse ronchi appreciated Cardiovascular: Normal S1, Normal S2, RRR  Abdomen: Soft, non tender, non distended, + BS  Extremities: Warm and well perfused   Labs and imaging were reviewed.   Micro  -- BAL 10/03/2023 with rare wbc present, no  organisms seen.  -- Urine culture with ESBL Ecoli 09/28/2023    Assessment and Plan  Case of a 73 years old female patient with a past medical history of failure to thrive, hypertension, GERD, insomnia, remote history of breast cancer who is presenting from  nursing home to [facility] with acute hypoxic respiratory failure on 09/28/2023.  She was found to have rhinovirus and parainfluenza virus positive.  Course complicated by worsening acute hypoxic respiratory failure requiring intubation mechanical ventilation on 10/02/2023.  Status post bronchoscopy with extensive secretions bilaterally.  # Acute hypoxic respiratory failure requiring intubation and mechanical ventilation on 10/02/2023 secondary to.... # Rhinovirus and parainfluenza pneumonia with possible superimposed bacterial pneumonia.  MRSA swab positive. # Mucous plugging with extensive secretions secondary to the above #UTI with ESBL E.Coli 06/10  #Shock likely distributive in the setting of the above.  # Extensive bilateral DVTs and PE (secondary to DVT due to immobilization and current inflammatory process) PE of low risk ft  # Failure to thrive # Bedbound at baseline # Hypertension, hyperlipidemia, insomnia  Neuro: Propofol  for RASS 0 to -1.  Fentanyl  for CPOT less than 2. CVS: Nor epi for MAP greater than 65. Add vasopressin and stress dose steroids. Heparin  drip.  Pulmonary: LPV with 6 cc/kg SpO2 > 90%, PH > 7.2. follow up BAL cultutre. Continue vancomycin  and zithro. Expand Ceft to meropenem .  GI: Start trickle feeds. Aaron Aas PPI for prohylaxis,  Renal: Avoid nephrotoxic agents. Monitor UOP.  Heme: Heparin  drip for PE/DVT.  Endo: POC goal 140-180.    Best Practice (right click and Reselect all SmartList Selections daily)   Diet/type: Trickle feed DVT prophylaxis systemic heparin  Pressure ulcer(s): N/A GI prophylaxis: PPI Lines: Left internal jugular CVC, Left radial A-line Foley:  Yes 06/14 Code Status:  full  code  Critical care time: 40 minutes     [name], MD [facility] 10/03/2023 8:48 AM

## 2023-10-04 DIAGNOSIS — R531 Weakness: Secondary | ICD-10-CM | POA: Diagnosis not present

## 2023-10-04 DIAGNOSIS — Z7189 Other specified counseling: Secondary | ICD-10-CM | POA: Diagnosis not present

## 2023-10-04 DIAGNOSIS — Z789 Other specified health status: Secondary | ICD-10-CM | POA: Diagnosis not present

## 2023-10-04 DIAGNOSIS — Z515 Encounter for palliative care: Secondary | ICD-10-CM | POA: Diagnosis not present

## 2023-10-04 DIAGNOSIS — A4101 Sepsis due to Methicillin susceptible Staphylococcus aureus: Secondary | ICD-10-CM | POA: Diagnosis not present

## 2023-10-04 LAB — CBC
HCT: 25.4 % — ABNORMAL LOW (ref 36.0–46.0)
Hemoglobin: 8.9 g/dL — ABNORMAL LOW (ref 12.0–15.0)
MCH: 31.4 pg (ref 26.0–34.0)
MCHC: 35 g/dL (ref 30.0–36.0)
MCV: 89.8 fL (ref 80.0–100.0)
Platelets: 287 10*3/uL (ref 150–400)
RBC: 2.83 MIL/uL — ABNORMAL LOW (ref 3.87–5.11)
RDW: 17.2 % — ABNORMAL HIGH (ref 11.5–15.5)
WBC: 9.6 10*3/uL (ref 4.0–10.5)
nRBC: 0 % (ref 0.0–0.2)

## 2023-10-04 LAB — BASIC METABOLIC PANEL WITH GFR
Anion gap: 8 (ref 5–15)
BUN: <5 mg/dL — ABNORMAL LOW (ref 8–23)
CO2: 21 mmol/L — ABNORMAL LOW (ref 22–32)
Calcium: 7 mg/dL — ABNORMAL LOW (ref 8.9–10.3)
Chloride: 108 mmol/L (ref 98–111)
Creatinine, Ser: <0.3 mg/dL — ABNORMAL LOW (ref 0.44–1.00)
Glucose, Bld: 236 mg/dL — ABNORMAL HIGH (ref 70–99)
Potassium: 4 mmol/L (ref 3.5–5.1)
Sodium: 137 mmol/L (ref 135–145)

## 2023-10-04 LAB — HEMOGLOBIN A1C
Hgb A1c MFr Bld: 5.2 % (ref 4.8–5.6)
Mean Plasma Glucose: 102.54 mg/dL

## 2023-10-04 LAB — HEMOGLOBIN AND HEMATOCRIT, BLOOD
HCT: 21.8 % — ABNORMAL LOW (ref 36.0–46.0)
HCT: 24.1 % — ABNORMAL LOW (ref 36.0–46.0)
Hemoglobin: 7.3 g/dL — ABNORMAL LOW (ref 12.0–15.0)
Hemoglobin: 8.3 g/dL — ABNORMAL LOW (ref 12.0–15.0)

## 2023-10-04 LAB — TROPONIN I (HIGH SENSITIVITY): Troponin I (High Sensitivity): 14 ng/L (ref <18)

## 2023-10-04 LAB — MAGNESIUM: Magnesium: 2.3 mg/dL (ref 1.7–2.4)

## 2023-10-04 LAB — GLUCOSE, CAPILLARY
Glucose-Capillary: 142 mg/dL — ABNORMAL HIGH (ref 70–99)
Glucose-Capillary: 220 mg/dL — ABNORMAL HIGH (ref 70–99)
Glucose-Capillary: 232 mg/dL — ABNORMAL HIGH (ref 70–99)
Glucose-Capillary: 194 mg/dL — ABNORMAL HIGH (ref 70–99)
Glucose-Capillary: 164 mg/dL — ABNORMAL HIGH (ref 70–99)
Glucose-Capillary: 156 mg/dL — ABNORMAL HIGH (ref 70–99)

## 2023-10-04 LAB — HEPARIN LEVEL (UNFRACTIONATED)
Heparin Unfractionated: 0.13 [IU]/mL — ABNORMAL LOW (ref 0.30–0.70)
Heparin Unfractionated: 0.24 [IU]/mL — ABNORMAL LOW (ref 0.30–0.70)

## 2023-10-04 LAB — APTT: aPTT: 67 s — ABNORMAL HIGH (ref 24–36)

## 2023-10-04 LAB — LACTIC ACID, PLASMA: Lactic Acid, Venous: 1.3 mmol/L (ref 0.5–1.9)

## 2023-10-04 LAB — PHOSPHORUS: Phosphorus: 3.1 mg/dL (ref 2.5–4.6)

## 2023-10-04 MED ORDER — LACTATED RINGERS IV SOLN: INTRAVENOUS | Status: DC

## 2023-10-04 MED ORDER — JUVEN PO PACK
1.0000 | PACK | Freq: Two times a day (BID) | ORAL | Status: DC
  Administered 2023-10-04 – 2023-10-06 (×4): 1

## 2023-10-04 MED ORDER — ALBUMIN HUMAN 25 % IV SOLN
25.0000 g | Freq: Once | INTRAVENOUS | Status: AC
  Administered 2023-10-04: 25 g via INTRAVENOUS
  Filled 2023-10-04: qty 100

## 2023-10-04 MED ORDER — PROPOFOL 1000 MG/100ML IV EMUL
0.0000 ug/kg/min | INTRAVENOUS | Status: DC
  Administered 2023-10-04 – 2023-10-05 (×2): 30 ug/kg/min via INTRAVENOUS
  Filled 2023-10-04: qty 100

## 2023-10-04 MED ORDER — DOCUSATE SODIUM 50 MG/5ML PO LIQD
100.0000 mg | Freq: Two times a day (BID) | ORAL | Status: DC
Start: 2023-10-04 — End: 2023-10-06
  Administered 2023-10-05: 100 mg
  Filled 2023-10-04 (×4): qty 10

## 2023-10-04 MED ORDER — POLYETHYLENE GLYCOL 3350 17 G PO PACK: 17.0000 g | PACK | Freq: Every day | ORAL | Status: DC

## 2023-10-04 MED ORDER — HEPARIN BOLUS VIA INFUSION
1000.0000 [IU] | Freq: Once | INTRAVENOUS | Status: AC
  Administered 2023-10-04: 1000 [IU] via INTRAVENOUS
  Filled 2023-10-04: qty 1000

## 2023-10-04 MED ORDER — LACTATED RINGERS IV BOLUS
500.0000 mL | Freq: Once | INTRAVENOUS | Status: AC
  Administered 2023-10-04: 500 mL via INTRAVENOUS

## 2023-10-04 MED ORDER — LACTATED RINGERS IV BOLUS
1000.0000 mL | Freq: Once | INTRAVENOUS | Status: AC
  Administered 2023-10-04: 1000 mL via INTRAVENOUS

## 2023-10-04 MED ORDER — INSULIN ASPART 100 UNIT/ML IJ SOLN
0.0000 [IU] | INTRAMUSCULAR | Status: DC
  Administered 2023-10-04 (×3): 2 [IU] via SUBCUTANEOUS
  Administered 2023-10-05: 1 [IU] via SUBCUTANEOUS
  Administered 2023-10-05 (×3): 2 [IU] via SUBCUTANEOUS
  Administered 2023-10-05 – 2023-10-06 (×4): 1 [IU] via SUBCUTANEOUS
  Administered 2023-10-06 – 2023-10-07 (×4): 2 [IU] via SUBCUTANEOUS
  Administered 2023-10-07: 1 [IU] via SUBCUTANEOUS
  Administered 2023-10-07: 2 [IU] via SUBCUTANEOUS
  Administered 2023-10-07: 3 [IU] via SUBCUTANEOUS
  Administered 2023-10-07: 2 [IU] via SUBCUTANEOUS
  Administered 2023-10-08: 1 [IU] via SUBCUTANEOUS
  Administered 2023-10-08: 2 [IU] via SUBCUTANEOUS
  Administered 2023-10-08 (×2): 1 [IU] via SUBCUTANEOUS
  Administered 2023-10-08 (×2): 2 [IU] via SUBCUTANEOUS
  Administered 2023-10-08 – 2023-10-09 (×2): 1 [IU] via SUBCUTANEOUS
  Administered 2023-10-09: 2 [IU] via SUBCUTANEOUS
  Administered 2023-10-09 (×2): 1 [IU] via SUBCUTANEOUS
  Administered 2023-10-09 – 2023-10-10 (×2): 2 [IU] via SUBCUTANEOUS
  Administered 2023-10-10 (×2): 1 [IU] via SUBCUTANEOUS
  Administered 2023-10-10: 2 [IU] via SUBCUTANEOUS
  Administered 2023-10-10: 1 [IU] via SUBCUTANEOUS
  Administered 2023-10-10: 2 [IU] via SUBCUTANEOUS
  Administered 2023-10-11 – 2023-10-13 (×14): 1 [IU] via SUBCUTANEOUS
  Filled 2023-10-04 (×52): qty 1

## 2023-10-04 MED ORDER — VITAL AF 1.2 CAL PO LIQD
1000.0000 mL | ORAL | Status: DC
  Administered 2023-10-04 – 2023-10-05 (×2): 1000 mL

## 2023-10-04 MED ORDER — ALBUMIN HUMAN 25 % IV SOLN
25.0000 g | Freq: Once | INTRAVENOUS | Status: AC
  Administered 2023-10-05: 25 g via INTRAVENOUS
  Filled 2023-10-04: qty 100

## 2023-10-04 MED ORDER — ALBUMIN HUMAN 25 % IV SOLN
12.5000 g | Freq: Once | INTRAVENOUS | Status: AC
  Administered 2023-10-04: 12.5 g via INTRAVENOUS
  Filled 2023-10-04: qty 50

## 2023-10-04 MED ORDER — LACTATED RINGERS IV BOLUS
500.0000 mL | Freq: Once | INTRAVENOUS | Status: AC
  Administered 2023-10-05: 500 mL via INTRAVENOUS

## 2023-10-04 MED ORDER — HEPARIN BOLUS VIA INFUSION
700.0000 [IU] | Freq: Once | INTRAVENOUS | Status: AC
  Administered 2023-10-04: 700 [IU] via INTRAVENOUS
  Filled 2023-10-04: qty 700

## 2023-10-04 MED ORDER — MIDAZOLAM HCL 2 MG/2ML IJ SOLN: 4.0000 mg | Freq: Once | INTRAMUSCULAR | Status: AC

## 2023-10-04 MED ORDER — MIDAZOLAM HCL 2 MG/2ML IJ SOLN
INTRAMUSCULAR | Status: AC
  Administered 2023-10-04: 4 mg via INTRAVENOUS
  Filled 2023-10-04: qty 4

## 2023-10-04 NOTE — Progress Notes (Addendum)
 PHARMACY - ANTICOAGULATION CONSULT NOTE  Pharmacy Consult for IV Heparin  Indication: pulmonary embolus  Patient Measurements: Height: 5' 5 (165.1 cm) Weight: 52.1 kg (114 lb 13.8 oz) IBW/kg (Calculated) : 57 HEPARIN  DW (KG): 45.1  Labs: Recent Labs    10/01/23 1153 10/01/23 1153 10/03/23 0412 10/03/23 0527 10/03/23 1427 10/03/23 1726 10/03/23 2310 10/04/23 0338 10/04/23 0846  HGB 11.5*  --   --  10.4*  --   --   --  8.9*  --   HCT 34.1*  --   --  30.3*  --   --   --  25.4*  --   PLT 364  --   --  382  --   --   --  287  --   APTT  --    < > 133*  --  63*  --  75*  --  67*  HEPARINUNFRC  --   --  >1.10*  --   --   --   --   --  0.13*  CREATININE 0.35*  --  <0.30* <0.30* <0.30* <0.30*  --  <0.30*  --    < > = values in this interval not displayed.    CrCl cannot be calculated (This lab value cannot be used to calculate CrCl because it is not a number: <0.30).   Medical History: Past Medical History:  Diagnosis Date   Bipolar affective (HCC)    Breast cancer (HCC)    Cancer (HCC) 12/11/2014   INVASIVE MAMMARY CARCINOMA/.left breast/ T2 N0   Cardiomegaly    Depression    Emphysema of lung (HCC)    Endometriosis    Hyperlipidemia    Osteoporosis    Schizoaffective disorder (HCC)    Schizophrenia (HCC)    Severe sepsis (HCC) 04/02/2022   Thyroid  nodule    Vitamin D  deficiency    Assessment: 73 y/o female presenting with altered mental status and hypoxia. PMH significant for bipolar disorder, breast cancer, anxiety, depression, hypothyroidism. CT imaging shows pulmonary embolism.Per chart review, patient is not on anticoagulation prior to admission. Pharmacy has been consulted to re- initiate and manage heparin  infusion.  Patient started Xarelto  (last dose 06/14 0825). Now transitioned back to heparin  drip.  Goal of Therapy:  Heparin  level 0.3-0.7 units/ml aPTT 66 - 102s Monitor platelets by anticoagulation protocol: Yes  0616 0846 HL 0.13, aPTT 67s, subthera;  650 un/hr   Plan:  --Heparin  level is subtherapeutic --Heparin  1000 unit IV bolus and increase heparin  infusion rate to 750 units/hr --Re-check HL 8 hours from rate change --Daily CBC per protocol while on IV heparin   Thank you for involving pharmacy in this patient's care.   [name] 10/04/2023 11:19 AM

## 2023-10-04 NOTE — Progress Notes (Signed)
 PHARMACY - ANTICOAGULATION CONSULT NOTE  Pharmacy Consult for heparin  Indication: pulmonary embolus  Allergies  Allergen Reactions   Cat Dander    Lactose Intolerance (Gi)     Diarrhea    Mixed Ragweed    Peanut-Containing Drug Products    Patient Measurements: Height: 5' 5 (165.1 cm) Weight: 45.1 kg (99 lb 6.8 oz) IBW/kg (Calculated) : 57 HEPARIN  DW (KG): 45.1  Vital Signs: Temp: 98.6 F (37 C) (06/16 0000) Temp Source: Esophageal (06/16 0000) BP: 94/70 (06/15 2200) Pulse Rate: 85 (06/16 0000)  Labs: Recent Labs    10/01/23 1153 10/03/23 0412 10/03/23 0527 10/03/23 1427 10/03/23 1726 10/03/23 2310  HGB 11.5*  --  10.4*  --   --   --   HCT 34.1*  --  30.3*  --   --   --   PLT 364  --  382  --   --   --   APTT  --  133*  --  63*  --  75*  HEPARINUNFRC  --  >1.10*  --   --   --   --   CREATININE 0.35* <0.30* <0.30* <0.30* <0.30*  --     CrCl cannot be calculated (This lab value cannot be used to calculate CrCl because it is not a number: <0.30).   Medical History: Past Medical History:  Diagnosis Date   Bipolar affective (HCC)    Breast cancer (HCC)    Cancer (HCC) 12/11/2014   INVASIVE MAMMARY CARCINOMA/.left breast/ T2 N0   Cardiomegaly    Depression    Emphysema of lung (HCC)    Endometriosis    Hyperlipidemia    Osteoporosis    Schizoaffective disorder (HCC)    Schizophrenia (HCC)    Severe sepsis (HCC) 04/02/2022   Thyroid  nodule    Vitamin D  deficiency    Assessment: 73 y/o female presenting with altered mental status and hypoxia. PMH significant for bipolar disorder, breast cancer, anxiety, depression, hypothyroidism. CT imaging shows pulmonary embolism.Per chart review, patient is not on anticoagulation prior to admission. Pharmacy has been consulted to re- initiate and manage heparin  infusion.  Patient started Xarelto  (last dose 06/14 0825). Now transitioned back to heparin  drip.  Goal of Therapy:  Heparin  level 0.3-0.7 units/ml aPTT 66 -  102s Monitor platelets by anticoagulation protocol: Yes  06/15 0412 aPTT 133, supratherapeutic / HL >1.1   Plan: aPTT therapeutic x 1 ---Continue heparin  infusion at 650 units/hr --- f/u aPTT 8 hrs after after rate change -assess aPTT and HL for correlation (we will use aPTT to guide therapy until aPTT and heparin  level correlate) ---CBC daily while on heparin   Thank you for involving pharmacy in this patient's care.   Coretta Dexter, PharmD, [facility] 10/04/2023 12:27 AM

## 2023-10-04 NOTE — Procedures (Signed)
 PROCEDURE: BRONCHOSCOPY   FLEXIBLE BRONCHOSCOPY WITH BRONCHOALVEOLAR LAVAGE  2. Therapeutic Aspiration of Tracheobronchial Tree  PROCEDURE DATE: 10/04/2023  TIME:  NAME:  Selena Pittman  DOB:12-02-1950  MRN: [id number] LOC:  IC04A/IC04A-AA    HOSP DAY: @LENGTHOFSTAYDAYS @ CODE STATUS:      Code Status Orders  (From admission, onward)           Start     Ordered   09/28/23 1702  Full code  Continuous       Question:  By:  Answer:  Default: patient does not have capacity for decision making, no surrogate or prior directive available   09/28/23 1703           Code Status History     Date Active Date Inactive Code Status Order ID Comments User Context   07/27/2023 1636 08/03/2023 2156 Full Code [id number]  [place], MD ED   10/29/2022 1057 11/04/2022 1624 Full Code [id number]  Montey Apa, DO ED   04/02/2022 1510 04/13/2022 0104 Full Code [id number]  Cox, Amy Neale Bale, DO ED           Indications/Preliminary Diagnosis:   Consent: (Place X beside choice/s below)  The benefits, risks and possible complications of the procedure were        explained to:  ___ patient  __X_ patient's family  ___ other:___________  who verbalized understanding and gave:  ___ verbal  __X_ written  ___ verbal and written  ___ telephone  ___ other:________ consent.      Unable to obtain consent; procedure performed on emergent basis.     Other:       PRESEDATION ASSESSMENT: History and Physical has been performed. Patient meds and allergies have been reviewed. Presedation airway examination has been performed and documented. Baseline vital signs, sedation score, oxygenation status, and cardiac rhythm were reviewed. Patient was deemed to be in satisfactory condition to undergo the procedure.    PREMEDICATIONS:   Sedative/Narcotic Amt Dose   Versed  2 mg   Fentanyl   mcg  Diprivan   mg            PROCEDURE DETAILS: Timeout performed and correct patient, name, & ID confirmed.  Following prep per Pulmonary policy, appropriate sedation was administered. The Bronchoscope was inserted in to oral cavity with bite block in place. Therapeutic aspiration of Tracheobronchial tree was performed.  Airway exam proceeded with findings, technical procedures, and specimen collection as noted below. At the end of exam the scope was withdrawn without incident. Impression and Plan as noted below.           Airway Prep (Place X beside choice below)   1% Transtracheal Lidocaine  Anesthetization 7 cc   Patient prepped per Bronchoscopy Lab Policy       Insertion Route (Place X beside choice below)   Nasal   Oral  X Endotracheal Tube   Tracheostomy   INTRAPROCEDURE MEDICATIONS:  Sedative/Narcotic Amt Dose   Versed  2 mg   Fentanyl   mcg  Diprivan   mg       Medication Amt Dose  Medication Amt Dose  Lidocaine  1%  cc  Epinephrine 1:10,000 sol  cc  Xylocaine  4%  cc  Cocaine  cc   TECHNICAL PROCEDURES: (Place X beside choice below)   Procedures  Description    None     Electrocautery     Cryotherapy     Balloon Dilatation     Bronchography     Stent Placement   X  Therapeutic  Aspiration RLL, RML, LLL, LINGULA    Laser/Argon Plasma    Brachytherapy Catheter Placement    Foreign Body Removal         SPECIMENS (Sites): (Place X beside choice below)  Specimens Description   No Specimens Obtained     Washings   X Lavage RML   Biopsies    Fine Needle Aspirates    Brushings    Sputum    FINDINGS:     Media Information  Document Information  MUCUS PLUGGING OF BRONCHI    Media Information  Document Information  MUCUS PLUGGING OF BRONCHI    ESTIMATED BLOOD LOSS: none COMPLICATIONS/RESOLUTION: none      IMPRESSION:POST-PROCEDURE DX:  THICK AND INSPISSATED MUCUS PLUGGING REQUIRING MULTIPLE THERAPEUTIC ASPIRATIONS.  BILATERAL MUCUS PLUGGING OF TRACHEOBRONCHIAL TREE    RECOMMENDATION/PLAN: AWAITING CULTURES FROM BAL     Erskin Hearing, M.D.   Pulmonary & Critical Care Medicine  [facility]

## 2023-10-04 NOTE — Progress Notes (Signed)
 NAME:  Selena Pittman, MRN:  [id number], DOB:  26-Jan-1951, LOS: 6 ADMISSION DATE:  09/28/2023   History of Present Illness:  s. Selena Pittman is a 73 year old female with history of hypertension, on midodrine , GERD, insomnia, left breast cancer, anxiety, depression, hypothyroid, bipolar, who presents ED for chief concerns of hypotension and hypoxia at [facility] house.   Vitals in the ED showed t 98.3, respiration rate of 18, heart rate 112, blood pressure 83/64, improved to 99/67, SpO2 of 92% on 2 L nasal cannula.   Serum sodium is 139, potassium 3.0, chloride one 0.6, bicarb 23, BUN of 7, serum creatinine 0.35, EGFR greater than 60, nonfasting blood glucose 99, WBC 11.6, hemoglobin 12.2, platelets of 474.HS troponin is 38, on repeat is 49. COVID/influenza A/influenza B/RSV PCR were negative.Lactic acid is 1.3. Chest x-ray with concern of atypical pneumonia. CTA chest with small bilateral PE and bilateral infiltrate concerning for pneumonia CT head with no acute abnormality, age-related atrophy and moderate paranasal sinus disease.  CT abdomen and pelvis with concern of enterocolitis and fecal impaction in rectum.   ED treatment: Heparin  bolus and gtt., vancomycin , cefepime , sodium chloride  1.5 liter bolus, midodrine  2.5 mg p.o. one-time dose.  Pertinent  Medical History  As above  Significant Hospital Events: Including procedures, antibiotic start and stop dates in addition to other pertinent events   6/11: Vital stable, preliminary blood cultures negative.  MRI brain with no acute intracranial abnormality.  Mucosal thickening with air-fluid level in left sphenoid and maxillary sinuses more consistent with acute sinusitis. MRSA PCR positive so adding vancomycin . Lower extremity venous Doppler with extensive bilateral DVT, right greater than left.  Echocardiogram was normal. Vascular surgery was consulted to see if she is a candidate for any intervention or IVC filter placement.    6/12: Patient was little more lethargic this morning so ABG was done, it shows pH of 7.51, CO2 30 and mild hypoxia with pO2 of 65.  Placed on 2 L of oxygen.  Later she did woke up.  Procalcitonin negative so checking respiratory viral panel as he might be having viral pneumonia.  Urine cultures with ESBL E. coli-ordered 1 dose of fosfomycin. based on her debility-vascular evaluated her and she will not be a candidate for any intervention.  Heparin  is being switched with Xarelto . Chest PT ordered as she remained quite congested Mild hypokalemia and hypomagnesemia-replating electrolytes 6/13: Patient remained quite congested.  Respiratory viral panel positive for rhino and parainfluenza virus.  Mild hypophosphatemia today which is being repleted. 06/14:  Patient admitted to the ICU, intubated and bronched. Sputum sent for cultutre. 10/03/23: WBC trending up. ESBL in urine culture. Severe hypokalemia. Persistent shock.  10/04/23- patient remains on MV, hyperglycemic this am. Has increased resp secretions which are inspissated.  She remains on levophed  and vasopressin infusion.  She is heparin  infusion.  She is at risk of re-feeding syndrome due to moderate protein calorie malnutrition.  She had multiple bowel movements overnight.  She is currently being treated for multi drug resistant UTI with viral LRTI due to parainfulenza and rhinovirus.   Palliative care is following.   Subjective Patient intubated and sedated.   Objective    Blood pressure 106/85, pulse 91, temperature 97.9 F (36.6 C), resp. rate 20, height 5' 5 (1.651 m), weight 52.1 kg, SpO2 100%. CVP:  [0 mmHg-20 mmHg] 2 mmHg  Vent Mode: PRVC FiO2 (%):  [30 %] 30 % Set Rate:  [20 bmp] 20 bmp Vt Set:  [330 mL] 330  mL PEEP:  [5 cmH20] 5 cmH20 Plateau Pressure:  [12 cmH20] 12 cmH20   Intake/Output Summary (Last 24 hours) at 10/04/2023 1000 Last data filed at 10/04/2023 0930 Gross per 24 hour  Intake 2684.84 ml  Output 515 ml  Net 2169.84  ml   Filed Weights   09/28/23 1600 10/02/23 1315 10/04/23 0341  Weight: 41.3 kg 45.1 kg 52.1 kg    Examination: General: Chronically ill, cachectic , intubated and sedated.  HENT: Supple neck, reactive pupils  Lungs: Diffuse ronchi appreciated Cardiovascular: Normal S1, Normal S2, RRR  Abdomen: Soft, non tender, non distended, + BS  Extremities: Warm and well perfused   Labs and imaging were reviewed.   Micro  -- BAL 10/03/2023 with rare wbc present, no organisms seen.  -- Urine culture with ESBL Ecoli 09/28/2023    Assessment and Plan  Case of a 73 years old female patient with a past medical history of failure to thrive, hypertension, GERD, insomnia, remote history of breast cancer who is presenting from [place] nursing home to [facility] with acute hypoxic respiratory failure on 09/28/2023.  She was found to have rhinovirus and parainfluenza virus positive.  Course complicated by worsening acute hypoxic respiratory failure requiring intubation mechanical ventilation on 10/02/2023.  Status post bronchoscopy with extensive secretions bilaterally.  1 Acute hypoxic respiratory failure requiring intubation and mechanical ventilation on 10/02/2023 secondary to Rhinovirus and parainfluenza pneumonia with possible superimposed bacterial pneumonia.  MRSA swab positive. 2 Mucous plugging with extensive secretions secondary to the above  3. Shock-septic present on admission due to UTI with ecoli           UTI with ESBL E.Coli 06/10  4 Extensive bilateral DVTs and PE (secondary to DVT due to immobilization and current inflammatory process) PE of low risk ft  5  Severe protein calorie malnutrition - refeeding syndrome -monitoring and correcting electrolytes  # Bedbound at baseline # Hypertension, hyperlipidemia, insomnia  Neuro: Propofol  for RASS 0 to -1.  Fentanyl  for CPOT less than 2. CVS: Nor epi for MAP greater than 65. Add vasopressin and stress dose steroids. Heparin  drip.  Pulmonary:  heavy phlegm with MV sounds GI: Start trickle feeds. Aaron Aas PPI for prohylaxis,  Renal: Avoid nephrotoxic agents. Monitor UOP.  Heme: Heparin  drip for PE/DVT.  Endo: POC goal 140-180.    Best Practice (right click and Reselect all SmartList Selections daily)   Diet/type: Trickle feed DVT prophylaxis systemic heparin  Pressure ulcer(s): N/A GI prophylaxis: PPI Lines: Left internal jugular CVC, Left radial A-line Foley:  Yes 06/14 Code Status:  full code  Critical care provider statement:   Total critical care time: 33 minutes   Performed by: Jaclynn Mast MD   Critical care time was exclusive of separately billable procedures and treating other patients.   Critical care was necessary to treat or prevent imminent or life-threatening deterioration.   Critical care was time spent personally by me on the following activities: development of treatment plan with patient and/or surrogate as well as nursing, discussions with consultants, evaluation of patient's response to treatment, examination of patient, obtaining history from patient or surrogate, ordering and performing treatments and interventions, ordering and review of laboratory studies, ordering and review of radiographic studies, pulse oximetry and re-evaluation of patient's condition.    Paulette Lynch, M.D.  Pulmonary & Critical Care Medicine

## 2023-10-04 NOTE — Plan of Care (Signed)
  Problem: Clinical Measurements: Goal: Ability to maintain clinical measurements within normal limits will improve Outcome: Progressing   Problem: Elimination: Goal: Will not experience complications related to bowel motility Outcome: Progressing   Problem: Clinical Measurements: Goal: Will remain free from infection Outcome: Not Progressing Goal: Respiratory complications will improve Outcome: Not Progressing   Problem: Activity: Goal: Risk for activity intolerance will decrease Outcome: Not Progressing   Problem: Coping: Goal: Level of anxiety will decrease Outcome: Not Progressing   Problem: Elimination: Goal: Will not experience complications related to urinary retention Outcome: Not Progressing

## 2023-10-04 NOTE — Progress Notes (Signed)
 Palliative Care Progress Note, Assessment & Plan   Patient Name: Selena Pittman       Date: 10/04/2023 DOB: [birth year]  Age: 73 y.o. MRN#: [id number] Attending Physician: Erskin Hearing, MD Primary Care Physician: Sharyne Degree, FNP Admit Date: 09/28/2023  Subjective: Unable to assess  HPI: 73 y.o. female  with past medical history significant for hypotension on midodrine , HLD, left breast cancer, bipolar, anxiety, depression, schizophrenia, schizoaffective disorder, and previous suicide attempt.  She presented to ED via EMS 09/28/2023 from [facility] complaining of lethargy, hypotension and hypoxia.  EMS reported patient had temp of 99.6 axillary and worsening cough. ED workup showed Na+ 139, K+ 3.0, bicarb 23, BUN 7, creatinine 0.35, GFR > 60, WBC 11.6, Hgb 12.2, platelets 474, troponin 38 => 49.  COVID/flu A-B/RSV negative.  Lactic 1.3. BP 99/67, HR 112, RR 18, SpO2 92% on 2 L O2 and 98.3.   Patient was admitted to [facility] for management of severe sepsis 2/2 PNA, pyuria, multilobar lung infiltrate, acute PE and severe protein calorie malnutrition.   PMT was consulted for assistance with goals of care discussion.   Patient status continued to worsen leading to rapid response being called 10/02/2023 requiring emergent intubation and transfer to ICU.  PMT was unable to complete consult due to this event.   Dr. Lucina Sabal had long discussion with family at the bedside including CODE STATUS and prognosis 10/02/2023.  [name] (son) at that time reports he was not comfortable making patient a DNR and wanted to exhaust all options.    Summary of counseling/coordination of care: Extensive chart review completed prior to meeting patient including labs, vital signs, imaging, progress notes, orders, and  available advanced directive documents from current and previous encounters.   After reviewing the patient's chart and I assessed patient at bedside. Elderly, ill-appearing female lying in bed.  She is frail and cachectic.  Currently sedated and intubated requiring pressor support.  Patient remains critically ill in ICU, sedated and mechanically ventilated for acute hypoxic respiratory failure secondary to rhinovirus and parainfluenza pneumonia with possible superimposed bacterial pneumonia, mucous plugging with extensive secretions, septic shock due to UTI with E. coli.  Patient has extensive bilateral DVTs and PE.  She is at risk of refeeding syndrome due to moderate protein calorie malnutrition. She has very poor prognosis.  She remains a full scope/full CODE STATUS per son (legal guardian) who requests to provide all medical interventions necessary.    Physical Exam Vitals reviewed.  Constitutional:      General: She is not in acute distress.    Appearance: She is ill-appearing.     Comments: Frail, cachectic  HENT:     Head: Normocephalic and atraumatic.  Pulmonary:     Comments: Mechanically ventilated and sedated  Musculoskeletal:     Right lower leg: No edema.     Left lower leg: No edema.   Skin:    General: Skin is warm and dry.     Recommendations/Plan: FULL CODE status as previously documented    Continue current supportive interventions Disposition TBD Palliative care will continue to follow for needs        Total Time 50 minutes   Time spent includes:  Detailed review of medical records (labs, imaging, vital signs), medically appropriate exam (mental status, respiratory, cardiac, skin), discussed with treatment team, counseling and educating patient, family and staff, documenting clinical information, medication management and coordination of care.     Ina Manas, [facility] Palliative Medicine Team  10/04/2023 1:07 PM  Office [phone]  Pager  [phone]

## 2023-10-04 NOTE — Plan of Care (Signed)
  Problem: Clinical Measurements: Goal: Cardiovascular complication will be avoided Outcome: Progressing   Problem: Nutrition: Goal: Adequate nutrition will be maintained Outcome: Progressing   Problem: Elimination: Goal: Will not experience complications related to bowel motility Outcome: Progressing   Problem: Safety: Goal: Ability to remain free from injury will improve Outcome: Progressing   Problem: Fluid Volume: Goal: Ability to maintain a balanced intake and output will improve Outcome: Progressing   Problem: Tissue Perfusion: Goal: Adequacy of tissue perfusion will improve Outcome: Progressing

## 2023-10-04 NOTE — Progress Notes (Signed)
 PHARMACY CONSULT NOTE  Pharmacy Consult for Electrolyte Monitoring and Replacement   Recent Labs: Potassium (mmol/L)  Date Value  10/04/2023 4.0  12/31/2014 4.0   Magnesium  (mg/dL)  Date Value  16/01/9603 2.3   Calcium  (mg/dL)  Date Value  54/12/8117 7.0 (L)   Albumin  (g/dL)  Date Value  14/78/2956 <1.5 (L)   Phosphorus (mg/dL)  Date Value  21/30/8657 3.1   Sodium (mmol/L)  Date Value  10/04/2023 137   Assessment: 73 y/o female presenting with altered mental status and hypoxia. PMH significant for bipolar disorder, breast cancer, anxiety, depression, hypothyroidism. Pharmacy is asked to follow and replace electrolytes while in CCU  Goal of Therapy:  Electrolytes WNL  Plan:  --No electrolyte replacement today --Labs tomorrow  [name] 10/04/2023 11:25 AM

## 2023-10-04 NOTE — Progress Notes (Signed)
 RT assisted Dr. Aleskerov and Alston Jerry RN with bedside bronchoscopy, no complications occurred with procedure. BAL obtained during procedure. Ambu disposable scope used.   Time scope in: 1359 Time Scope out: 1405

## 2023-10-04 NOTE — Progress Notes (Signed)
 PHARMACY - ANTICOAGULATION CONSULT NOTE  Pharmacy Consult for IV Heparin  Indication: pulmonary embolus  Patient Measurements: Height: 5' 5 (165.1 cm) Weight: 52.1 kg (114 lb 13.8 oz) IBW/kg (Calculated) : 57 HEPARIN  DW (KG): 45.1  Labs: Recent Labs    10/03/23 0412 10/03/23 0412 10/03/23 0527 10/03/23 1427 10/03/23 1726 10/03/23 2310 10/04/23 0338 10/04/23 0846 10/04/23 1449 10/04/23 1614 10/04/23 2023  HGB  --    < > 10.4*  --   --   --  8.9*  --   --  7.3*  --   HCT  --   --  30.3*  --   --   --  25.4*  --   --  21.8*  --   PLT  --   --  382  --   --   --  287  --   --   --   --   APTT 133*  --   --  63*  --  75*  --  67*  --   --   --   HEPARINUNFRC >1.10*  --   --   --   --   --   --  0.13*  --   --  0.24*  CREATININE <0.30*  --  <0.30* <0.30* <0.30*  --  <0.30*  --   --   --   --   TROPONINIHS  --   --   --   --   --   --   --   --  14  --   --    < > = values in this interval not displayed.    CrCl cannot be calculated (This lab value cannot be used to calculate CrCl because it is not a number: <0.30).   Medical History: Past Medical History:  Diagnosis Date   Bipolar affective (HCC)    Breast cancer (HCC)    Cancer (HCC) 12/11/2014   INVASIVE MAMMARY CARCINOMA/.left breast/ T2 N0   Cardiomegaly    Depression    Emphysema of lung (HCC)    Endometriosis    Hyperlipidemia    Osteoporosis    Schizoaffective disorder (HCC)    Schizophrenia (HCC)    Severe sepsis (HCC) 04/02/2022   Thyroid  nodule    Vitamin D  deficiency    Assessment: 73 y/o female presenting with altered mental status and hypoxia. PMH significant for bipolar disorder, breast cancer, anxiety, depression, hypothyroidism. CT imaging shows pulmonary embolism.Per chart review, patient is not on anticoagulation prior to admission. Pharmacy has been consulted to re- initiate and manage heparin  infusion.  Patient started Xarelto  (last dose 06/14 0825). Now transitioned back to heparin   drip.  Goal of Therapy:  Heparin  level 0.3-0.7 units/ml aPTT 66 - 102s Monitor platelets by anticoagulation protocol: Yes  06/16 0846 HL 0.13, aPTT 67s, subthera; 650 un/hr 06/16 2023 HL 0.24, subthera: 750 un/hr   Plan:  --Heparin  level is subtherapeutic --Heparin  700 unit IV bolus and increase heparin  infusion rate to 850 units/hr --Re-check HL 8 hours from rate change --Daily CBC per protocol while on IV heparin   Thank you for involving pharmacy in this patient's care.   Yuko Coventry A Daneille Desilva 10/04/2023 8:55 PM

## 2023-10-04 NOTE — Progress Notes (Addendum)
 Nutrition Follow-up  DOCUMENTATION CODES:   Underweight, Severe malnutrition in context of chronic illness  INTERVENTION:   Vital 1.2@45ml /hr- begin increasing by 51ml/hr q 8 hours until goal rate is reached.   Free water flushes 30ml q4 hours to maintain tube patency   Regimen provides 1296kcal/day, 81g/day protein and 1085ml/day of free water.   Pt remains at high refeed risk; recommend monitor potassium, magnesium  and phosphorus labs daily until stable  MVI daily via tube   Juven Fruit Punch BID via tube, each serving provides 95kcal and 2.5g of protein (amino acids glutamine and arginine)  Daily weights   Vitamin A lab pending  NUTRITION DIAGNOSIS:   Severe Malnutrition related to social / environmental circumstances as evidenced by severe fat depletion, severe muscle depletion, percent weight loss. -ongoing   GOAL:   Provide needs based on ASPEN/SCCM guidelines -not met   MONITOR:   Vent status, Labs, Weight trends, TF tolerance, I & O's, Skin  ASSESSMENT:   73 y/o female with h/o emphysema, MDD, anxiety, chronic constipation, GERD, schizophrenia, bipolar disorder, HLD, bedbound and resides at SNF, breast cancer s/p mastectomy (2016), hypothyroidism and necrotizing pneumonia who is admitted with UTI, PNA, septic shock, fecal impaction, rhinovirus and parainfluenza, BLE DVTs and PE.  Pt remains sedated and ventilated. OGT in place and pt is tolerating trickle tube feeds. Pt had a large BM this morning. Will begin increasing tube feeds towards goal rate today. Pt remains at refeed risk. Per chart, pt is up ~18lbs from her UBW. Pt +6.7L on her I & Os. Pt is noted to have anasarca on exam today. Albumin  given today.   Medications reviewed and include: vitamin C , solu-cortef, insulin , MVI, protonix , thiamine , heparin , meropenem , levophed , propofol , vasopressin   Labs reviewed: K 4.0 wnl, BUN <5(L), creat <0.30(L), P 3.1 wnl, Mg 2.3 wnl Hgb 8.9(L), Hct 25.4(L) Cbgs-  194, 232, 220 x 24 hrs   Patient is currently intubated on ventilator support MV: 6.3 L/min Temp (24hrs), Avg:98.4 F (36.9 C), Min:97.9 F (36.6 C), Max:99 F (37.2 C)  Propofol : 4.96 ml/hr- provides 131kcal/day   MAP- >40mmHg   UOP-   NUTRITION - FOCUSED PHYSICAL EXAM:  Flowsheet Row Most Recent Value  Orbital Region Severe depletion  Upper Arm Region Unable to assess  Thoracic and Lumbar Region Severe depletion  Buccal Region Severe depletion  Temple Region Severe depletion  Clavicle Bone Region Severe depletion  Clavicle and Acromion Bone Region Severe depletion  Scapular Bone Region Severe depletion  Dorsal Hand Moderate depletion  Patellar Region Unable to assess  Anterior Thigh Region Unable to assess  Posterior Calf Region Unable to assess  Edema (RD Assessment) Moderate  Hair Reviewed  Eyes Reviewed  Mouth Reviewed  Skin Reviewed  Nails Reviewed   Diet Order:   Diet Order             Diet NPO time specified  Diet effective now                  EDUCATION NEEDS:   Education needs have been addressed  Skin:  Skin Assessment: Skin Integrity Issues: Skin Integrity Issues:: Stage II Stage II: sacrum  Last BM:  6/16- type 7  Height:   Ht Readings from Last 1 Encounters:  10/02/23 5' 5 (1.651 m)    Weight:   Wt Readings from Last 1 Encounters:  10/04/23 52.1 kg    Ideal Body Weight:  56.8 kg  BMI:  Body mass index is 19.11  kg/m.  Estimated Nutritional Needs:   Kcal:  1109kcal/day  Protein:  70-80g/day  Fluid:  1.1-1.3L/day  Torrance Freestone MS, RD, LDN If unable to be reached, please send secure chat to RD inpatient available from 8:00a-4:00p daily

## 2023-10-04 NOTE — Progress Notes (Signed)
 Monitor alarming R on T PVCs. Janey Meek, NP made aware. 12-lead EKG obtained and sent to epic.

## 2023-10-04 NOTE — TOC Progression Note (Signed)
 Transition of Care [facility]) - Progression Note    Patient Details  Name: Selena Pittman MRN: [id number] Date of Birth: [birth year]  Transition of Care [facility]) CM/SW Contact  Marlissa Emerick A Paschal Blanton, RN Phone Number: 10/04/2023, 2:47 PM  Clinical Narrative:    Chart reviewed.  Noted that patient was admitted with multi drug resistant UTI with viral LRTI due to parainfluenza and rhinovirus.  Patient remains intubated and is currently on IV Levophed  and Vasopressin infusion.  Patient remains critically ill.    Noted that patient is long-term care resident at [facility].  Palliative is following patient.    TOCP will continue to follow for discharge planning.     Expected Discharge Plan: Skilled Nursing Facility Barriers to Discharge: Continued Medical Work up  Expected Discharge Plan and Services       Living arrangements for the past 2 months: Skilled Nursing Facility                                       Social Determinants of Health (SDOH) Interventions SDOH Screenings   Food Insecurity: No Food Insecurity (10/29/2022)  Housing: Low Risk  (10/29/2022)  Transportation Needs: No Transportation Needs (10/29/2022)  Utilities: Not At Risk (10/29/2022)  Social Connections: Patient Unable To Answer (09/29/2023)  Tobacco Use: High Risk (09/28/2023)    Readmission Risk Interventions    09/30/2023   10:38 AM 07/30/2023   12:52 PM  Readmission Risk Prevention Plan  Transportation Screening Complete Complete  PCP or Specialist Appt within 3-5 Days Complete   HRI or Home Care Consult  Complete  Social Work Consult for Recovery Care Planning/Counseling Complete   Palliative Care Screening Not Applicable   Medication Review Oceanographer) Complete Complete

## 2023-10-04 NOTE — Progress Notes (Signed)
 Updated pts son at bedside regarding pts condition and current plan of care.  All questions answered   Janey Meek, AGNP  Pulmonary/Critical Care Pager [phone] (please enter 7 digits) PCCM Consult Pager [phone] (please enter 7 digits)

## 2023-10-05 DIAGNOSIS — R531 Weakness: Secondary | ICD-10-CM | POA: Diagnosis not present

## 2023-10-05 DIAGNOSIS — A4101 Sepsis due to Methicillin susceptible Staphylococcus aureus: Secondary | ICD-10-CM | POA: Diagnosis not present

## 2023-10-05 DIAGNOSIS — J189 Pneumonia, unspecified organism: Secondary | ICD-10-CM | POA: Diagnosis not present

## 2023-10-05 DIAGNOSIS — R918 Other nonspecific abnormal finding of lung field: Secondary | ICD-10-CM | POA: Diagnosis not present

## 2023-10-05 DIAGNOSIS — I2699 Other pulmonary embolism without acute cor pulmonale: Secondary | ICD-10-CM | POA: Diagnosis not present

## 2023-10-05 DIAGNOSIS — Z515 Encounter for palliative care: Secondary | ICD-10-CM | POA: Diagnosis not present

## 2023-10-05 LAB — BASIC METABOLIC PANEL WITH GFR
Anion gap: 5 (ref 5–15)
BUN: 9 mg/dL (ref 8–23)
CO2: 21 mmol/L — ABNORMAL LOW (ref 22–32)
Calcium: 7.3 mg/dL — ABNORMAL LOW (ref 8.9–10.3)
Chloride: 109 mmol/L (ref 98–111)
Creatinine, Ser: <0.3 mg/dL — ABNORMAL LOW (ref 0.44–1.00)
Glucose, Bld: 165 mg/dL — ABNORMAL HIGH (ref 70–99)
Potassium: 3.2 mmol/L — ABNORMAL LOW (ref 3.5–5.1)
Sodium: 135 mmol/L (ref 135–145)

## 2023-10-05 LAB — HEPARIN LEVEL (UNFRACTIONATED)
Heparin Unfractionated: 0.31 [IU]/mL (ref 0.30–0.70)
Heparin Unfractionated: 0.28 [IU]/mL — ABNORMAL LOW (ref 0.30–0.70)
Heparin Unfractionated: 0.34 [IU]/mL (ref 0.30–0.70)

## 2023-10-05 LAB — CBC WITH DIFFERENTIAL/PLATELET
Abs Immature Granulocytes: 0.14 10*3/uL — ABNORMAL HIGH (ref 0.00–0.07)
Basophils Absolute: 0 10*3/uL (ref 0.0–0.1)
Basophils Relative: 0 %
Eosinophils Absolute: 0 10*3/uL (ref 0.0–0.5)
Eosinophils Relative: 0 %
HCT: 23.4 % — ABNORMAL LOW (ref 36.0–46.0)
Hemoglobin: 8 g/dL — ABNORMAL LOW (ref 12.0–15.0)
Immature Granulocytes: 2 %
Lymphocytes Relative: 8 %
Lymphs Abs: 0.5 10*3/uL — ABNORMAL LOW (ref 0.7–4.0)
MCH: 30.9 pg (ref 26.0–34.0)
MCHC: 34.2 g/dL (ref 30.0–36.0)
MCV: 90.3 fL (ref 80.0–100.0)
Monocytes Absolute: 0.2 10*3/uL (ref 0.1–1.0)
Monocytes Relative: 3 %
Neutro Abs: 5.3 10*3/uL (ref 1.7–7.7)
Neutrophils Relative %: 87 %
Platelets: 255 10*3/uL (ref 150–400)
RBC: 2.59 MIL/uL — ABNORMAL LOW (ref 3.87–5.11)
RDW: 17.2 % — ABNORMAL HIGH (ref 11.5–15.5)
WBC: 6.2 10*3/uL (ref 4.0–10.5)
nRBC: 0 % (ref 0.0–0.2)

## 2023-10-05 LAB — GLUCOSE, CAPILLARY
Glucose-Capillary: 136 mg/dL — ABNORMAL HIGH (ref 70–99)
Glucose-Capillary: 140 mg/dL — ABNORMAL HIGH (ref 70–99)
Glucose-Capillary: 142 mg/dL — ABNORMAL HIGH (ref 70–99)
Glucose-Capillary: 164 mg/dL — ABNORMAL HIGH (ref 70–99)
Glucose-Capillary: 174 mg/dL — ABNORMAL HIGH (ref 70–99)
Glucose-Capillary: 181 mg/dL — ABNORMAL HIGH (ref 70–99)

## 2023-10-05 LAB — PHOSPHORUS
Phosphorus: 1.2 mg/dL — ABNORMAL LOW (ref 2.5–4.6)
Phosphorus: 1.5 mg/dL — ABNORMAL LOW (ref 2.5–4.6)

## 2023-10-05 LAB — VITAMIN A: Vitamin A (Retinoic Acid): 6.3 ug/dL — ABNORMAL LOW (ref 22.0–69.5)

## 2023-10-05 LAB — MAGNESIUM: Magnesium: 2.1 mg/dL (ref 1.7–2.4)

## 2023-10-05 LAB — LEGIONELLA PNEUMOPHILA SEROGP 1 UR AG: L. pneumophila Serogp 1 Ur Ag: NEGATIVE

## 2023-10-05 LAB — POTASSIUM: Potassium: 2.6 mmol/L — CL (ref 3.5–5.1)

## 2023-10-05 MED ORDER — KETAMINE BOLUS VIA INFUSION: 0.5000 mg/kg | Freq: Once | INTRAVENOUS | Status: DC

## 2023-10-05 MED ORDER — LINEZOLID 600 MG/300ML IV SOLN
600.0000 mg | Freq: Two times a day (BID) | INTRAVENOUS | Status: DC
  Administered 2023-10-05 – 2023-10-07 (×5): 600 mg via INTRAVENOUS
  Filled 2023-10-05 (×5): qty 300

## 2023-10-05 MED ORDER — LACTATED RINGERS IV BOLUS
1000.0000 mL | Freq: Once | INTRAVENOUS | Status: AC
  Administered 2023-10-05: 1000 mL via INTRAVENOUS

## 2023-10-05 MED ORDER — HEPARIN BOLUS VIA INFUSION
500.0000 [IU] | Freq: Once | INTRAVENOUS | Status: AC
  Administered 2023-10-05: 500 [IU] via INTRAVENOUS
  Filled 2023-10-05: qty 500

## 2023-10-05 MED ORDER — KETAMINE HCL-SODIUM CHLORIDE 1000-0.69 MG/100ML-% IV SOLN
0.5000 mg/kg/h | INTRAVENOUS | Status: DC
  Filled 2023-10-05: qty 100

## 2023-10-05 MED ORDER — ALBUMIN HUMAN 25 % IV SOLN
12.5000 g | Freq: Once | INTRAVENOUS | Status: AC
  Administered 2023-10-05: 12.5 g via INTRAVENOUS
  Filled 2023-10-05: qty 50

## 2023-10-05 MED ORDER — NUTRISOURCE FIBER PO PACK
1.0000 | PACK | Freq: Two times a day (BID) | ORAL | Status: DC
  Administered 2023-10-05 – 2023-10-06 (×2): 1
  Filled 2023-10-05 (×2): qty 1

## 2023-10-05 MED ORDER — POTASSIUM & SODIUM PHOSPHATES 280-160-250 MG PO PACK
2.0000 | PACK | ORAL | Status: AC
  Administered 2023-10-05 (×5): 2
  Filled 2023-10-05 (×7): qty 2

## 2023-10-05 MED ORDER — POTASSIUM PHOSPHATES 15 MMOLE/5ML IV SOLN
15.0000 mmol | Freq: Once | INTRAVENOUS | Status: AC
  Administered 2023-10-06: 15 mmol via INTRAVENOUS
  Filled 2023-10-05: qty 5

## 2023-10-05 MED ORDER — POTASSIUM CHLORIDE 10 MEQ/100ML IV SOLN
10.0000 meq | INTRAVENOUS | Status: AC
  Administered 2023-10-05 – 2023-10-06 (×3): 10 meq via INTRAVENOUS
  Filled 2023-10-05 (×3): qty 100

## 2023-10-05 MED ORDER — IPRATROPIUM-ALBUTEROL 0.5-2.5 (3) MG/3ML IN SOLN
3.0000 mL | Freq: Three times a day (TID) | RESPIRATORY_TRACT | Status: DC
  Administered 2023-10-05 – 2023-10-06 (×3): 3 mL via RESPIRATORY_TRACT
  Filled 2023-10-05 (×3): qty 3

## 2023-10-05 NOTE — Consult Note (Addendum)
 WOC Nurse Consult Note:  WOC consult preformed remotely utilizing chart review and images.  Reason for Consult:stage II sacral spine pressure injury and moisture associated injury  Wound type: DTPI to sacrum  Pressure Injury POA: NO Measurement: see nursing flowsheet Wound bed: non blanchable dark red discoloration to sacrum Drainage (amount, consistency, odor) see nursing flow sheet Periwound: macerated Dressing procedure/placement/frequency: Bedside nurse reports severe diarrhea and no options for a fecal management system to be utilized, as such foam dressing would also not be a good option due to chronic bowel incontinence.   Place patient on low air loss mattress for pressure redistribution and moisture management  Apply Desitin to sacral and perineal area post incontinence episodes to protect skin  WOC team will follow patient weekly.  Please re-consult if new needs arise.   Gillermo Lack, RN, MSN, [facility] WOC Team

## 2023-10-05 NOTE — Progress Notes (Addendum)
 NAME:  Selena Pittman, MRN:  [id number], DOB:  1951/04/14, LOS: 7 ADMISSION DATE:  09/28/2023   History of Present Illness:  s. Selena Pittman is a 73 year old female with history of hypertension, on midodrine , GERD, insomnia, left breast cancer, anxiety, depression, hypothyroid, bipolar, who presents ED for chief concerns of hypotension and hypoxia at [facility] house.   Vitals in the ED showed t 98.3, respiration rate of 18, heart rate 112, blood pressure 83/64, improved to 99/67, SpO2 of 92% on 2 L nasal cannula.   Serum sodium is 139, potassium 3.0, chloride one 0.6, bicarb 23, BUN of 7, serum creatinine 0.35, EGFR greater than 60, nonfasting blood glucose 99, WBC 11.6, hemoglobin 12.2, platelets of 474.HS troponin is 38, on repeat is 49. COVID/influenza A/influenza B/RSV PCR were negative.Lactic acid is 1.3. Chest x-ray with concern of atypical pneumonia. CTA chest with small bilateral PE and bilateral infiltrate concerning for pneumonia CT head with no acute abnormality, age-related atrophy and moderate paranasal sinus disease.  CT abdomen and pelvis with concern of enterocolitis and fecal impaction in rectum.   ED treatment: Heparin  bolus and gtt., vancomycin , cefepime , sodium chloride  1.5 liter bolus, midodrine  2.5 mg p.o. one-time dose.  Pertinent  Medical History  As above  Significant Hospital Events: Including procedures, antibiotic start and stop dates in addition to other pertinent events   6/11: Vital stable, preliminary blood cultures negative.  MRI brain with no acute intracranial abnormality.  Mucosal thickening with air-fluid level in left sphenoid and maxillary sinuses more consistent with acute sinusitis. MRSA PCR positive so adding vancomycin . Lower extremity venous Doppler with extensive bilateral DVT, right greater than left.  Echocardiogram was normal. Vascular surgery was consulted to see if she is a candidate for any intervention or IVC filter placement.    6/12: Patient was little more lethargic this morning so ABG was done, it shows pH of 7.51, CO2 30 and mild hypoxia with pO2 of 65.  Placed on 2 L of oxygen.  Later she did woke up.  Procalcitonin negative so checking respiratory viral panel as he might be having viral pneumonia.  Urine cultures with ESBL E. coli-ordered 1 dose of fosfomycin. based on her debility-vascular evaluated her and she will not be a candidate for any intervention.  Heparin  is being switched with Xarelto . Chest PT ordered as she remained quite congested Mild hypokalemia and hypomagnesemia-replating electrolytes 6/13: Patient remained quite congested.  Respiratory viral panel positive for rhino and parainfluenza virus.  Mild hypophosphatemia today which is being repleted. 06/14:  Patient admitted to the ICU, intubated and bronched. Sputum sent for cultutre. 10/03/23: WBC trending up. ESBL in urine culture. Severe hypokalemia. Persistent shock.  10/04/23- patient remains on MV, hyperglycemic this am. Has increased resp secretions which are inspissated.  She remains on levophed  and vasopressin infusion.  She is heparin  infusion.  She is at risk of re-feeding syndrome due to moderate protein calorie malnutrition.  She had multiple bowel movements overnight.  She is currently being treated for multi drug resistant UTI with viral LRTI due to parainfulenza and rhinovirus.   Palliative care is following.  10/05/23- ESBL, MRSA, parainfluenza 3 and rhinoevirus positive, pharmcy consultant recommendation for zyvox  initiation, CVP monitoring, sedation req is high we are changing to ketamine from prop due to shock physiology.  She has sacral decubitus stage 3 present on admission.     Objective    Blood pressure 117/84, pulse 81, temperature (!) 96.3 F (35.7 C), resp. rate (!) 23, height 5'  5 (1.651 m), weight 55.3 kg, SpO2 100%. CVP:  [0 mmHg-15 mmHg] 7 mmHg  Vent Mode: PRVC FiO2 (%):  [30 %] 30 % Set Rate:  [20 bmp] 20 bmp Vt Set:  [330  mL] 330 mL PEEP:  [5 cmH20] 5 cmH20 Plateau Pressure:  [16 cmH20-20 cmH20] 16 cmH20   Intake/Output Summary (Last 24 hours) at 10/05/2023 1011 Last data filed at 10/05/2023 0837 Gross per 24 hour  Intake 5291.73 ml  Output 684 ml  Net 4607.73 ml   Filed Weights   10/02/23 1315 10/04/23 0341 10/05/23 0432  Weight: 45.1 kg 52.1 kg 55.3 kg    Examination: General: Chronically ill, cachectic , intubated and sedated.  HENT: Supple neck, reactive pupils  Lungs: Diffuse ronchi appreciated Cardiovascular: Normal S1, Normal S2, RRR  Abdomen: Soft, non tender, non distended, + BS  Extremities: Warm and well perfused   Labs and imaging were reviewed.   Micro  -- BAL 10/03/2023 with rare wbc present, no organisms seen.  -- Urine culture with ESBL Ecoli 09/28/2023    Assessment and Plan  Case of a 73 years old female patient with a past medical history of failure to thrive, hypertension, GERD, insomnia, remote history of breast cancer who is presenting from [place] nursing home to [facility] with acute hypoxic respiratory failure on 09/28/2023.  She was found to have rhinovirus and parainfluenza virus positive.  Course complicated by worsening acute hypoxic respiratory failure requiring intubation mechanical ventilation on 10/02/2023.  Status post bronchoscopy with extensive secretions bilaterally.  1 Acute hypoxic respiratory failure requiring intubation and mechanical ventilation on 10/02/2023 secondary to Rhinovirus and parainfluenza pneumonia with possible superimposed bacterial pneumonia.  MRSA swab positive. 2 Mucous plugging with extensive secretions secondary to the above  3. Shock-septic present on admission due to UTI with ecoli           UTI with ESBL E.Coli 06/10  4 Extensive bilateral DVTs and PE (secondary to DVT due to immobilization and current inflammatory process) PE of low risk ft  5  Severe protein calorie malnutrition - refeeding syndrome -monitoring and correcting  electrolytes 6.  Sacral decubitus stge 3 -present on admission - patient is bedbound at baseline, wound care consultation  # Bedbound at baseline # Hypertension, hyperlipidemia, insomnia  Neuro: Propofol  for RASS 0 to -1.  Fentanyl  for CPOT less than 2. CVS: Nor epi for MAP greater than 65. Add vasopressin and stress dose steroids. Heparin  drip.  Pulmonary: heavy phlegm with MV sounds GI: Start trickle feeds. Aaron Aas PPI for prohylaxis,  Renal: Avoid nephrotoxic agents. Monitor UOP.  Heme: Heparin  drip for PE/DVT.  Endo: POC goal 140-180.    Best Practice (right click and Reselect all SmartList Selections daily)   Diet/type: Trickle feed DVT prophylaxis systemic heparin  Pressure ulcer(s): N/A GI prophylaxis: PPI Lines: Left internal jugular CVC, Left radial A-line Foley:  Yes 06/14 Code Status:  full code  Critical care provider statement:   Total critical care time: 93 minutes   Performed by: Jaclynn Mast MD   Critical care time was exclusive of separately billable procedures and treating other patients.   Critical care was necessary to treat or prevent imminent or life-threatening deterioration.   Critical care was time spent personally by me on the following activities: development of treatment plan with patient and/or surrogate as well as nursing, discussions with consultants, evaluation of patient's response to treatment, examination of patient, obtaining history from patient or surrogate, ordering and performing treatments and interventions, ordering and review  of laboratory studies, ordering and review of radiographic studies, pulse oximetry and re-evaluation of patient's condition.    Erielle Gawronski, M.D.  Pulmonary & Critical Care Medicine

## 2023-10-05 NOTE — Plan of Care (Signed)
  Problem: Nutrition: Goal: Adequate nutrition will be maintained Outcome: Progressing   Problem: Nutritional: Goal: Maintenance of adequate nutrition will improve Outcome: Progressing

## 2023-10-05 NOTE — Progress Notes (Signed)
 PHARMACY - ANTICOAGULATION CONSULT NOTE  Pharmacy Consult for IV Heparin  Indication: pulmonary embolus  Patient Measurements: Height: 5' 5 (165.1 cm) Weight: 55.3 kg (121 lb 14.6 oz) IBW/kg (Calculated) : 57 HEPARIN  DW (KG): 45.1  Labs: Recent Labs    10/03/23 0527 10/03/23 1427 10/03/23 1726 10/03/23 2310 10/04/23 0338 10/04/23 0846 10/04/23 1449 10/04/23 1614 10/04/23 2023 10/04/23 2202 10/05/23 0429 10/05/23 1244  HGB 10.4*  --   --   --  8.9*  --   --  7.3*  --  8.3* 8.0*  --   HCT 30.3*  --   --   --  25.4*  --   --  21.8*  --  24.1* 23.4*  --   PLT 382  --   --   --  287  --   --   --   --   --  255  --   APTT  --  63*  --  75*  --  67*  --   --   --   --   --   --   HEPARINUNFRC  --   --   --   --   --  0.13*  --   --  0.24*  --  0.31 0.28*  CREATININE <0.30* <0.30* <0.30*  --  <0.30*  --   --   --   --   --  <0.30*  --   TROPONINIHS  --   --   --   --   --   --  14  --   --   --   --   --     CrCl cannot be calculated (This lab value cannot be used to calculate CrCl because it is not a number: <0.30).   Medical History: Past Medical History:  Diagnosis Date   Bipolar affective (HCC)    Breast cancer (HCC)    Cancer (HCC) 12/11/2014   INVASIVE MAMMARY CARCINOMA/.left breast/ T2 N0   Cardiomegaly    Depression    Emphysema of lung (HCC)    Endometriosis    Hyperlipidemia    Osteoporosis    Schizoaffective disorder (HCC)    Schizophrenia (HCC)    Severe sepsis (HCC) 04/02/2022   Thyroid  nodule    Vitamin D  deficiency    Assessment: 73 y/o female presenting with altered mental status and hypoxia. PMH significant for bipolar disorder, breast cancer, anxiety, depression, hypothyroidism. CT imaging shows pulmonary embolism.Per chart review, patient is not on anticoagulation prior to admission. Pharmacy has been consulted to re- initiate and manage heparin  infusion.  Patient started Xarelto  (last dose 06/14 0825). Now transitioned back to heparin   drip.  Goal of Therapy:  Heparin  level 0.3-0.7 units/ml aPTT 66 - 102s Monitor platelets by anticoagulation protocol: Yes  06/16 0846 HL 0.13, aPTT 67s, subthera; 650 un/hr 06/16 2023 HL 0.24, subthera; 750 un/hr 06/17 0429 HL 0.31, therapeutic X 1 06/17 1244 HL 0.28, subthera; 850 un/hr   Plan:  --Heparin  level is slightly subtherapeutic --Heparin  500 unit IV bolus and increase heparin  infusion to 950 units/hr --Re-check HL in 8 hours --Daily CBC per protocol while on IV heparin   Thank you for involving pharmacy in this patient's care.   [name] 10/05/2023 1:33 PM

## 2023-10-05 NOTE — Progress Notes (Signed)
 Palliative Care Progress Note, Assessment & Plan   Patient Name: Selena Pittman       Date: 10/05/2023 DOB: 10/28/1950  Age: 73 y.o. MRN#: [id number] Attending Physician: Erskin Hearing, MD Primary Care Physician: Sharyne Degree, FNP Admit Date: 09/28/2023  Subjective: Patient is lying in bed, intubated and off of sedation currently for wake up assessment.  She is getting a full bath with nurse and nurse tech at bedside.  No family or friends present during my visit.  HPI: 73 y.o. female  with past medical history significant for hypotension on midodrine , HLD, left breast cancer, bipolar, anxiety, depression, schizophrenia, schizoaffective disorder, and previous suicide attempt.  She presented to ED via EMS 09/28/2023 from [facility] complaining of lethargy, hypotension and hypoxia.  EMS reported patient had temp of 99.6 axillary and worsening cough. ED workup showed Na+ 139, K+ 3.0, bicarb 23, BUN 7, creatinine 0.35, GFR > 60, WBC 11.6, Hgb 12.2, platelets 474, troponin 38 => 49.  COVID/flu A-B/RSV negative.  Lactic 1.3. BP 99/67, HR 112, RR 18, SpO2 92% on 2 L O2 and 98.3.   Patient was admitted to [facility] for management of severe sepsis 2/2 PNA, pyuria, multilobar lung infiltrate, acute PE and severe protein calorie malnutrition.   PMT was consulted for assistance with goals of care discussion.   Patient status continued to worsen leading to rapid response being called 10/02/2023 requiring emergent intubation and transfer to ICU.  PMT was unable to complete consult due to this event.   Dr. Lucina Sabal had long discussion with family at the bedside including CODE STATUS and prognosis 10/02/2023.  [name] (son) at that time reports he was not comfortable making patient a DNR and wanted to exhaust all  options.  Summary of counseling/coordination of care: Extensive chart review completed prior to meeting patient including labs, vital signs, imaging, progress notes, orders, and available advanced directive documents from current and previous encounters.   Assessed the patient at bedside.  She remains unable to participate in goals of care or medical decision making independently at this time.  Updates received from RN at bedside.  Counseled with CCM NP Nelson in regards to plan and goals of care.  She plans on updating patient's son shortly. Patient is off sedation in hopes of her being able to follow simple commands.  No plan to extubate but cognitive/mental status needs to be evaluated.  No acute palliative needs today. PMT will continue to follow and support.  Physical Exam Vitals reviewed.  Constitutional:      Appearance: She is ill-appearing.     Comments: Thin, cachectic  HENT:     Head: Normocephalic.  Pulmonary:     Comments: Ventilatory support Abdominal:     Palpations: Abdomen is soft.   Skin:    General: Skin is warm and dry.             Total Time 25 minutes   Time spent includes: Detailed review of medical records (labs, imaging, vital signs), medically appropriate exam (mental status, respiratory, cardiac, skin), discussed with treatment team, counseling and educating patient, family and staff, documenting clinical information, medication management and coordination of care.  Judeen Nose L. Rebbeca Campi, DNP, FNP-BC Palliative  Medicine Team

## 2023-10-05 NOTE — Progress Notes (Signed)
 PHARMACY CONSULT NOTE  Pharmacy Consult for Electrolyte Monitoring and Replacement   Recent Labs: Potassium (mmol/L)  Date Value  10/05/2023 3.2 (L)  12/31/2014 4.0   Magnesium  (mg/dL)  Date Value  78/46/9629 2.1   Calcium  (mg/dL)  Date Value  52/84/1324 7.3 (L)   Albumin  (g/dL)  Date Value  40/01/2724 <1.5 (L)   Phosphorus (mg/dL)  Date Value  36/64/4034 1.2 (L)   Sodium (mmol/L)  Date Value  10/05/2023 135   Assessment: 73 y/o female presenting with altered mental status and hypoxia. PMH significant for bipolar disorder, breast cancer, anxiety, depression, hypothyroidism. Pharmacy is asked to follow and replace electrolytes while in CCU  Goal of Therapy:  Electrolytes WNL  Plan:  --K 3.2, Phos 1.2; ordered Phos-Nak 2 packets per tube q4h x 5 doses per PCCM --Labs tomorrow  [name] 10/05/2023 7:38 AM

## 2023-10-05 NOTE — Progress Notes (Signed)
 PHARMACY - ANTICOAGULATION CONSULT NOTE  Pharmacy Consult for IV Heparin  Indication: pulmonary embolus  Patient Measurements: Height: 5' 5 (165.1 cm) Weight: 55.3 kg (121 lb 14.6 oz) IBW/kg (Calculated) : 57 HEPARIN  DW (KG): 45.1  Labs: Recent Labs    10/03/23 0527 10/03/23 1427 10/03/23 1726 10/03/23 2310 10/04/23 0338 10/04/23 0846 10/04/23 1449 10/04/23 1614 10/04/23 2023 10/04/23 2202 10/05/23 0429 10/05/23 1244 10/05/23 2252  HGB 10.4*  --   --   --  8.9*  --   --  7.3*  --  8.3* 8.0*  --   --   HCT 30.3*  --   --   --  25.4*  --   --  21.8*  --  24.1* 23.4*  --   --   PLT 382  --   --   --  287  --   --   --   --   --  255  --   --   APTT  --  63*  --  75*  --  67*  --   --   --   --   --   --   --   HEPARINUNFRC  --   --   --   --   --  0.13*  --   --    < >  --  0.31 0.28* 0.34  CREATININE <0.30* <0.30* <0.30*  --  <0.30*  --   --   --   --   --  <0.30*  --   --   TROPONINIHS  --   --   --   --   --   --  14  --   --   --   --   --   --    < > = values in this interval not displayed.    CrCl cannot be calculated (This lab value cannot be used to calculate CrCl because it is not a number: <0.30).   Medical History: Past Medical History:  Diagnosis Date   Bipolar affective (HCC)    Breast cancer (HCC)    Cancer (HCC) 12/11/2014   INVASIVE MAMMARY CARCINOMA/.left breast/ T2 N0   Cardiomegaly    Depression    Emphysema of lung (HCC)    Endometriosis    Hyperlipidemia    Osteoporosis    Schizoaffective disorder (HCC)    Schizophrenia (HCC)    Severe sepsis (HCC) 04/02/2022   Thyroid  nodule    Vitamin D  deficiency    Assessment: 73 y/o female presenting with altered mental status and hypoxia. PMH significant for bipolar disorder, breast cancer, anxiety, depression, hypothyroidism. CT imaging shows pulmonary embolism.Per chart review, patient is not on anticoagulation prior to admission. Pharmacy has been consulted to re- initiate and manage heparin   infusion.  Patient started Xarelto  (last dose 06/14 0825). Now transitioned back to heparin  drip.  Goal of Therapy:  Heparin  level 0.3-0.7 units/ml aPTT 66 - 102s Monitor platelets by anticoagulation protocol: Yes  06/16 0846 HL 0.13, aPTT 67s, subthera; 650 un/hr 06/16 2023 HL 0.24, subthera; 750 un/hr 06/17 0429 HL 0.31, therapeutic X 1 06/17 1244 HL 0.28, subthera; 850 un/hr 06/17 2252 HL 0.34, therapeutic X 1   Plan:  6/17:  HL @ 2252 = 0.34, therapeutic X 1 - Will continue pt on current rate and recheck HL in 8 hrs --Daily CBC per protocol while on IV heparin   Thank you for involving pharmacy in this patient's care.   Royalty Fakhouri D  10/05/2023 11:30 PM

## 2023-10-05 NOTE — Progress Notes (Signed)
 Patient responding to voice and now following simple commands. RT at bedside and patient placed into PS. Dr. Aleskerov made aware. Per Dr. Aleskerov, if patient requires any sedation, use ketamine or fentanyl  infusion.

## 2023-10-05 NOTE — Progress Notes (Signed)
 PHARMACY - ANTICOAGULATION CONSULT NOTE  Pharmacy Consult for IV Heparin  Indication: pulmonary embolus  Patient Measurements: Height: 5' 5 (165.1 cm) Weight: 55.3 kg (121 lb 14.6 oz) IBW/kg (Calculated) : 57 HEPARIN  DW (KG): 45.1  Labs: Recent Labs    10/03/23 0527 10/03/23 1427 10/03/23 1726 10/03/23 2310 10/04/23 0338 10/04/23 0846 10/04/23 1449 10/04/23 1614 10/04/23 2023 10/04/23 2202 10/05/23 0429  HGB 10.4*  --   --   --  8.9*  --   --  7.3*  --  8.3* 8.0*  HCT 30.3*  --   --   --  25.4*  --   --  21.8*  --  24.1* 23.4*  PLT 382  --   --   --  287  --   --   --   --   --  255  APTT  --  63*  --  75*  --  67*  --   --   --   --   --   HEPARINUNFRC  --   --   --   --   --  0.13*  --   --  0.24*  --  0.31  CREATININE <0.30* <0.30* <0.30*  --  <0.30*  --   --   --   --   --   --   TROPONINIHS  --   --   --   --   --   --  14  --   --   --   --     CrCl cannot be calculated (This lab value cannot be used to calculate CrCl because it is not a number: <0.30).   Medical History: Past Medical History:  Diagnosis Date   Bipolar affective (HCC)    Breast cancer (HCC)    Cancer (HCC) 12/11/2014   INVASIVE MAMMARY CARCINOMA/.left breast/ T2 N0   Cardiomegaly    Depression    Emphysema of lung (HCC)    Endometriosis    Hyperlipidemia    Osteoporosis    Schizoaffective disorder (HCC)    Schizophrenia (HCC)    Severe sepsis (HCC) 04/02/2022   Thyroid  nodule    Vitamin D  deficiency    Assessment: 73 y/o female presenting with altered mental status and hypoxia. PMH significant for bipolar disorder, breast cancer, anxiety, depression, hypothyroidism. CT imaging shows pulmonary embolism.Per chart review, patient is not on anticoagulation prior to admission. Pharmacy has been consulted to re- initiate and manage heparin  infusion.  Patient started Xarelto  (last dose 06/14 0825). Now transitioned back to heparin  drip.  Goal of Therapy:  Heparin  level 0.3-0.7  units/ml aPTT 66 - 102s Monitor platelets by anticoagulation protocol: Yes  06/16 0846 HL 0.13, aPTT 67s, subthera; 650 un/hr 06/16 2023 HL 0.24, subthera: 750 un/hr 06/17 0429 HL 0.31, therapeutic X 1    Plan:  6/17:  HL @ 0429 = 0.31, therapeutic X 1 - Will continue pt on current rate and recheck HL in 8 hrs  --Re-check HL 8 hours from rate change --Daily CBC per protocol while on IV heparin   Thank you for involving pharmacy in this patient's care.   Hayle Parisi D 10/05/2023 5:03 AM

## 2023-10-05 NOTE — Progress Notes (Signed)
 Updated pts son Steven Otero via telephone regarding pts condition and plan of care.  Informed Mr. Cassaro pt has been off of sedation for a few hrs, and is still unable to follow commands.  Therefore, we are unable to take her off of the ventilator.  Also, informed him if pt does not follow commands in the next few hrs off of sedation we will order an MR Brain.  He was appreciative to receive an update.  Will continue to monitor and assess pt   Janey Meek, [facility]  Pulmonary/Critical Care Pager [phone] (please enter 7 digits) PCCM Consult Pager [phone] (please enter 7 digits)

## 2023-10-06 ENCOUNTER — Inpatient Hospital Stay

## 2023-10-06 DIAGNOSIS — R918 Other nonspecific abnormal finding of lung field: Secondary | ICD-10-CM | POA: Diagnosis not present

## 2023-10-06 DIAGNOSIS — J189 Pneumonia, unspecified organism: Secondary | ICD-10-CM | POA: Diagnosis not present

## 2023-10-06 DIAGNOSIS — R531 Weakness: Secondary | ICD-10-CM | POA: Diagnosis not present

## 2023-10-06 DIAGNOSIS — J9601 Acute respiratory failure with hypoxia: Secondary | ICD-10-CM | POA: Diagnosis not present

## 2023-10-06 DIAGNOSIS — A4101 Sepsis due to Methicillin susceptible Staphylococcus aureus: Secondary | ICD-10-CM | POA: Diagnosis not present

## 2023-10-06 LAB — BASIC METABOLIC PANEL WITH GFR
Anion gap: 3 — ABNORMAL LOW (ref 5–15)
BUN: 11 mg/dL (ref 8–23)
CO2: 28 mmol/L (ref 22–32)
Calcium: 7.1 mg/dL — ABNORMAL LOW (ref 8.9–10.3)
Chloride: 106 mmol/L (ref 98–111)
Creatinine, Ser: <0.3 mg/dL — ABNORMAL LOW (ref 0.44–1.00)
Glucose, Bld: 125 mg/dL — ABNORMAL HIGH (ref 70–99)
Potassium: 3.2 mmol/L — ABNORMAL LOW (ref 3.5–5.1)
Sodium: 137 mmol/L (ref 135–145)

## 2023-10-06 LAB — CBC
HCT: 21.9 % — ABNORMAL LOW (ref 36.0–46.0)
Hemoglobin: 7.4 g/dL — ABNORMAL LOW (ref 12.0–15.0)
MCH: 30.8 pg (ref 26.0–34.0)
MCHC: 33.8 g/dL (ref 30.0–36.0)
MCV: 91.3 fL (ref 80.0–100.0)
Platelets: 237 10*3/uL (ref 150–400)
RBC: 2.4 MIL/uL — ABNORMAL LOW (ref 3.87–5.11)
RDW: 17 % — ABNORMAL HIGH (ref 11.5–15.5)
WBC: 9.8 10*3/uL (ref 4.0–10.5)
nRBC: 0 % (ref 0.0–0.2)

## 2023-10-06 LAB — MAGNESIUM: Magnesium: 1.9 mg/dL (ref 1.7–2.4)

## 2023-10-06 LAB — POTASSIUM: Potassium: 2.8 mmol/L — ABNORMAL LOW (ref 3.5–5.1)

## 2023-10-06 LAB — ALBUMIN: Albumin: 2.3 g/dL — ABNORMAL LOW (ref 3.5–5.0)

## 2023-10-06 LAB — GLUCOSE, CAPILLARY
Glucose-Capillary: 158 mg/dL — ABNORMAL HIGH (ref 70–99)
Glucose-Capillary: 147 mg/dL — ABNORMAL HIGH (ref 70–99)
Glucose-Capillary: 158 mg/dL — ABNORMAL HIGH (ref 70–99)
Glucose-Capillary: 161 mg/dL — ABNORMAL HIGH (ref 70–99)
Glucose-Capillary: 102 mg/dL — ABNORMAL HIGH (ref 70–99)

## 2023-10-06 LAB — PHOSPHORUS
Phosphorus: 2.2 mg/dL — ABNORMAL LOW (ref 2.5–4.6)
Phosphorus: 1.2 mg/dL — ABNORMAL LOW (ref 2.5–4.6)

## 2023-10-06 LAB — HEPARIN LEVEL (UNFRACTIONATED): Heparin Unfractionated: 0.4 [IU]/mL (ref 0.30–0.70)

## 2023-10-06 LAB — TRIGLYCERIDES: Triglycerides: 126 mg/dL (ref <150)

## 2023-10-06 MED ORDER — ALBUMIN HUMAN 25 % IV SOLN
25.0000 g | Freq: Two times a day (BID) | INTRAVENOUS | Status: AC
  Administered 2023-10-06: 25 g via INTRAVENOUS
  Administered 2023-10-06: 12.5 g via INTRAVENOUS
  Filled 2023-10-06 (×2): qty 100

## 2023-10-06 MED ORDER — OSMOLITE 1.2 CAL PO LIQD
1000.0000 mL | ORAL | Status: DC
  Administered 2023-10-06 – 2023-10-14 (×7): 1000 mL

## 2023-10-06 MED ORDER — GLYCOPYRROLATE 0.2 MG/ML IJ SOLN
0.1000 mg | Freq: Three times a day (TID) | INTRAMUSCULAR | Status: DC | PRN
  Administered 2023-10-06 – 2023-10-07 (×2): 0.1 mg via INTRAVENOUS
  Filled 2023-10-06 (×2): qty 1

## 2023-10-06 MED ORDER — MORPHINE SULFATE (PF) 2 MG/ML IV SOLN
INTRAVENOUS | Status: AC
  Filled 2023-10-06: qty 1

## 2023-10-06 MED ORDER — POTASSIUM CHLORIDE 2 MEQ/ML IV SOLN
INTRAVENOUS | Status: AC
  Filled 2023-10-06 (×3): qty 1000

## 2023-10-06 MED ORDER — POTASSIUM CHLORIDE 20 MEQ PO PACK
40.0000 meq | PACK | Freq: Once | ORAL | Status: AC
  Administered 2023-10-06: 40 meq
  Filled 2023-10-06: qty 2

## 2023-10-06 MED ORDER — ETOMIDATE 2 MG/ML IV SOLN: 20.0000 mg | Freq: Once | INTRAVENOUS | Status: DC

## 2023-10-06 MED ORDER — ACETAMINOPHEN 10 MG/ML IV SOLN
1000.0000 mg | Freq: Four times a day (QID) | INTRAVENOUS | Status: AC
  Administered 2023-10-06 – 2023-10-07 (×4): 1000 mg via INTRAVENOUS
  Filled 2023-10-06 (×4): qty 100

## 2023-10-06 MED ORDER — POTASSIUM PHOSPHATES 15 MMOLE/5ML IV SOLN
30.0000 mmol | Freq: Once | INTRAVENOUS | Status: AC
  Administered 2023-10-07: 30 mmol via INTRAVENOUS
  Filled 2023-10-06: qty 10

## 2023-10-06 MED ORDER — ROCURONIUM BROMIDE 10 MG/ML (PF) SYRINGE: 50.0000 mg | PREFILLED_SYRINGE | Freq: Once | INTRAVENOUS | Status: DC

## 2023-10-06 MED ORDER — IPRATROPIUM-ALBUTEROL 0.5-2.5 (3) MG/3ML IN SOLN
3.0000 mL | RESPIRATORY_TRACT | Status: DC
  Administered 2023-10-06 – 2023-10-12 (×35): 3 mL via RESPIRATORY_TRACT
  Filled 2023-10-06 (×34): qty 3

## 2023-10-06 MED ORDER — MORPHINE SULFATE (PF) 2 MG/ML IV SOLN
0.5000 mg | Freq: Once | INTRAVENOUS | Status: AC
  Administered 2023-10-06: 0.5 mg via INTRAVENOUS

## 2023-10-06 MED ORDER — GERHARDT'S BUTT CREAM
TOPICAL_CREAM | CUTANEOUS | Status: DC | PRN
  Filled 2023-10-06 (×2): qty 60

## 2023-10-06 MED ORDER — POLYETHYLENE GLYCOL 3350 17 G PO PACK
17.0000 g | PACK | Freq: Every day | ORAL | Status: DC | PRN
Start: 2023-10-06 — End: 2023-10-06

## 2023-10-06 MED ORDER — FENTANYL CITRATE (PF) 100 MCG/2ML IJ SOLN: 100.0000 ug | Freq: Once | INTRAMUSCULAR | Status: DC

## 2023-10-06 MED ORDER — SODIUM CHLORIDE 3 % IN NEBU
4.0000 mL | INHALATION_SOLUTION | Freq: Two times a day (BID) | RESPIRATORY_TRACT | Status: AC
  Administered 2023-10-06 – 2023-10-09 (×6): 4 mL via RESPIRATORY_TRACT
  Filled 2023-10-06 (×6): qty 4

## 2023-10-06 MED ORDER — ACETAMINOPHEN 10 MG/ML IV SOLN
1000.0000 mg | Freq: Once | INTRAVENOUS | Status: AC
  Administered 2023-10-06: 1000 mg via INTRAVENOUS
  Filled 2023-10-06: qty 100

## 2023-10-06 MED ORDER — POTASSIUM CHLORIDE 10 MEQ/100ML IV SOLN
10.0000 meq | INTRAVENOUS | Status: AC
  Administered 2023-10-06 (×2): 10 meq via INTRAVENOUS
  Filled 2023-10-06 (×2): qty 100

## 2023-10-06 MED ORDER — FENTANYL CITRATE (PF) 100 MCG/2ML IJ SOLN
INTRAMUSCULAR | Status: AC
  Filled 2023-10-06: qty 2

## 2023-10-06 MED ORDER — MORPHINE SULFATE (PF) 2 MG/ML IV SOLN
1.0000 mg | INTRAVENOUS | Status: DC | PRN
  Administered 2023-10-06 – 2023-10-11 (×7): 1 mg via INTRAVENOUS
  Filled 2023-10-06 (×7): qty 1

## 2023-10-06 MED ORDER — ROCURONIUM BROMIDE 10 MG/ML (PF) SYRINGE
PREFILLED_SYRINGE | INTRAVENOUS | Status: AC
  Filled 2023-10-06: qty 10

## 2023-10-06 MED ORDER — MAGNESIUM SULFATE 2 GM/50ML IV SOLN
2.0000 g | Freq: Once | INTRAVENOUS | Status: AC
  Administered 2023-10-06: 2 g via INTRAVENOUS
  Filled 2023-10-06: qty 50

## 2023-10-06 NOTE — Progress Notes (Signed)
 Palliative Care Progress Note, Assessment & Plan   Patient Name: Selena Pittman       Date: 10/06/2023 DOB: [birth year]  Age: 73 y.o. MRN#: [id number] Attending Physician: Erskin Hearing, MD Primary Care Physician: Sharyne Degree, FNP Admit Date: 09/28/2023  Subjective: Patient is sitting up in bed, intubated and off sedation.  She is awake and alert.  She is able to track me with her eyes.  When asked to perform simple comman no family or friends present during my visit.  Ds such as squeezing my fingers or lifting her arms, patient shook her head no.  HPI: 73 y.o. female  with past medical history significant for hypotension on midodrine , HLD, left breast cancer, bipolar, anxiety, depression, schizophrenia, schizoaffective disorder, and previous suicide attempt.  She presented to ED via EMS 09/28/2023 from [facility] healthcare complaining of lethargy, hypotension and hypoxia.  EMS reported patient had temp of 99.6 axillary and worsening cough. ED workup showed Na+ 139, K+ 3.0, bicarb 23, BUN 7, creatinine 0.35, GFR > 60, WBC 11.6, Hgb 12.2, platelets 474, troponin 38 => 49.  COVID/flu A-B/RSV negative.  Lactic 1.3. BP 99/67, HR 112, RR 18, SpO2 92% on 2 L O2 and 98.3.   Patient was admitted to [facility] for management of severe sepsis 2/2 PNA, pyuria, multilobar lung infiltrate, acute PE and severe protein calorie malnutrition.   PMT was consulted for assistance with goals of care discussion.   Patient status continued to worsen leading to rapid response being called 10/02/2023 requiring emergent intubation and transfer to ICU.  PMT was unable to complete consult due to this event.   Dr. Lucina Sabal had long discussion with family at the bedside including CODE STATUS and prognosis 10/02/2023.  [name] (son) at that  time reports he was not comfortable making patient a DNR and wanted to exhaust all options.  6/18 - extubated  Summary of counseling/coordination of care: Extensive chart review completed prior to meeting patient including labs, vital signs, imaging, progress notes, orders, and available advanced directive documents from current and previous encounters.   After reviewing the patient's chart and assessing the patient at bedside, I spoke with patient in regards to symptom management and goals of care.  Is able to respond by nodding her head yes and no, she remains unable to participate in goals of care or medical decision making independently at this time.  As per chart review, CCM plans to extubate today.  Son has been updated and continues to endorse full code and full scope of care.  Reintubation planned if patient fails extubation.  No acute palliative needs at this time.  PMD will continue to follow and support patient and family throughout her hospitalization.  Physical Exam Vitals reviewed.  Constitutional:      Comments: Thin, frail  HENT:     Nose: Nose normal.     Mouth/Throat:     Mouth: Mucous membranes are moist.  Pulmonary:     Comments: Ventilatory support Abdominal:     Palpations: Abdomen is soft.   Skin:    General: Skin is warm and dry.     Coloration: Skin is pale.   Neurological:     Mental Status: She  is alert.   Psychiatric:        Behavior: Behavior normal.             Total Time 25 minutes   Time spent includes: Detailed review of medical records (labs, imaging, vital signs), medically appropriate exam (mental status, respiratory, cardiac, skin), discussed with treatment team, counseling and educating patient, family and staff, documenting clinical information, medication management and coordination of care.  Judeen Nose L. Rebbeca Campi, DNP, FNP-BC Palliative Medicine Team

## 2023-10-06 NOTE — Progress Notes (Signed)
 CRITICAL CARE  Patient s/p liberation from MV.  She has episodes of mild hypoxemia which is transient and was immediately improved with RT suctioning and repositioning of oxygen mask interface. Patient is frail, deconditioned bed bound with chronic psychiatric illness from NH and baseline is likely close to current status.  Overall her prognosis is poor. At this point we are delaying re-intubation until absolutely necessary due to likely deterioration if placed back on MV. We discussed her condition and current medical plan with POA son of patient who requests full scope of care and is appreciative of follow up.    Selena Pittman, M.D.  Pulmonary & Critical Care Medicine  [facility]

## 2023-10-06 NOTE — Progress Notes (Addendum)
 Nutrition Follow-up  DOCUMENTATION CODES:   Underweight, Severe malnutrition in context of chronic illness  INTERVENTION:   Change to Osmolite 1.2@55ml /hr continuous   Free water flushes 30ml q4 hours to maintain tube patency   Regimen provides 1584kcal/day, 73g/day protein and 1262ml/day of free water.   Pt remains at high refeed risk; recommend monitor potassium, magnesium  and phosphorus labs daily until stable  MVI daily via tube  Vitamin C  500mg  BID via tube   Thiamine  100mg  daily via tube   Discontinue Juven   Discontinue NutriSource fiber   Daily weights   Vitamin A 50,000 units po daily x 2 weeks once initiated on a oral diet   NUTRITION DIAGNOSIS:   Severe Malnutrition related to social / environmental circumstances as evidenced by severe fat depletion, severe muscle depletion, percent weight loss. -ongoing   GOAL:   Provide needs based on ASPEN/SCCM guidelines -met   MONITOR:   Vent status, Labs, Weight trends, TF tolerance, I & O's, Skin  ASSESSMENT:   73 y/o female with h/o emphysema, MDD, anxiety, chronic constipation, GERD, schizophrenia, bipolar disorder, HLD, bedbound and resides at SNF, breast cancer s/p mastectomy (2016), hypothyroidism and necrotizing pneumonia who is admitted with UTI, PNA, septic shock, fecal impaction, rhinovirus and parainfluenza, BLE DVTs and PE.  Pt extubated today. Dobhoff tube placed; will continue tube feeds. Pt noted to have significant diarrhea; fiber added yesterday but will discontinue today as this tends to clog dobhoff tubes. Pt actively refeeding; electrolytes being monitored and supplemented per pharmacy. Pt with noted vitamin A deficiency; will provided supplementation once pt initiated on an oral diet. Per chart, pt is up ~23lbs from admission. Pt +18.0L on her I & Os. Pt is receiving albumin . Pt noted to have significant edema.   Medications reviewed and include: vitamin C , solu-cortef, insulin , MVI, juven,  protonix , thiamine , albumin , heparin , LRS @75ml /hr, linezolid , meropenem , levophed , vasopressin   UOP-   Diet Order:   Diet Order             Diet NPO time specified  Diet effective now                  EDUCATION NEEDS:   Education needs have been addressed  Skin:  Skin Assessment: Reviewed RN Assessment (Stage II sacrum, rectal prolaspe) Skin Integrity Issues:: Stage II Stage II: sacrum  Last BM:  6/18- type 7  Height:   Ht Readings from Last 1 Encounters:  10/05/23 5' 5 (1.651 m)    Weight:   Wt Readings from Last 1 Encounters:  10/05/23 55.3 kg    Ideal Body Weight:  56.8 kg  BMI:  Body mass index is 20.29 kg/m.  Estimated Nutritional Needs:   Kcal:  1400-1600kcal/day  Protein:  70-80g/day  Fluid:  1.1-1.3L/day  Torrance Freestone MS, RD, LDN If unable to be reached, please send secure chat to RD inpatient available from 8:00a-4:00p daily

## 2023-10-06 NOTE — Progress Notes (Signed)
 PHARMACY - ANTICOAGULATION CONSULT NOTE  Pharmacy Consult for IV Heparin  Indication: pulmonary embolus  Patient Measurements: Height: 5' 5 (165.1 cm) Weight: 55.3 kg (121 lb 14.6 oz) IBW/kg (Calculated) : 57 HEPARIN  DW (KG): 45.1  Labs: Recent Labs    10/03/23 1427 10/03/23 1726 10/03/23 2310 10/04/23 0338 10/04/23 0338 10/04/23 0846 10/04/23 1449 10/04/23 1614 10/04/23 2023 10/04/23 2202 10/05/23 0429 10/05/23 1244 10/05/23 2252 10/06/23 0643  HGB  --   --   --  8.9*   < >  --   --  7.3*  --  8.3* 8.0*  --   --   --   HCT  --   --   --  25.4*   < >  --   --  21.8*  --  24.1* 23.4*  --   --   --   PLT  --   --   --  287  --   --   --   --   --   --  255  --   --   --   APTT 63*  --  75*  --   --  67*  --   --   --   --   --   --   --   --   HEPARINUNFRC  --   --   --   --   --  0.13*  --   --    < >  --  0.31 0.28* 0.34 0.40  CREATININE <0.30* <0.30*  --  <0.30*  --   --   --   --   --   --  <0.30*  --   --   --   TROPONINIHS  --   --   --   --   --   --  14  --   --   --   --   --   --   --    < > = values in this interval not displayed.    CrCl cannot be calculated (This lab value cannot be used to calculate CrCl because it is not a number: <0.30).   Medical History: Past Medical History:  Diagnosis Date   Bipolar affective (HCC)    Breast cancer (HCC)    Cancer (HCC) 12/11/2014   INVASIVE MAMMARY CARCINOMA/.left breast/ T2 N0   Cardiomegaly    Depression    Emphysema of lung (HCC)    Endometriosis    Hyperlipidemia    Osteoporosis    Schizoaffective disorder (HCC)    Schizophrenia (HCC)    Severe sepsis (HCC) 04/02/2022   Thyroid  nodule    Vitamin D  deficiency    Assessment: 73 y/o female presenting with altered mental status and hypoxia. PMH significant for bipolar disorder, breast cancer, anxiety, depression, hypothyroidism. CT imaging shows pulmonary embolism.Per chart review, patient is not on anticoagulation prior to admission. Pharmacy has been  consulted to re- initiate and manage heparin  infusion.  Patient started Xarelto  (last dose 06/14 0825). Now transitioned back to heparin  drip.  Goal of Therapy:  Heparin  level 0.3-0.7 units/ml aPTT 66 - 102s Monitor platelets by anticoagulation protocol: Yes  06/16 0846 HL 0.13, aPTT 67s, subthera; 650 un/hr 06/16 2023 HL 0.24, subthera; 750 un/hr 06/17 0429 HL 0.31, therapeutic x 1 06/17 1244 HL 0.28, subthera; 850 un/hr 06/17 2252 HL 0.34, therapeutic x 1 06/18 0643 HL 0.40, therapeutic x 2  Plan:  --Heparin  level is therapeutic x 2 --Continue heparin   infusion at 950 units/hr --Re-check HL & CBC tomorrow AM  Thank you for involving pharmacy in this patient's care.   Tailey Top B Domenique Quest 10/06/2023 7:43 AM

## 2023-10-06 NOTE — Progress Notes (Signed)
 PHARMACY CONSULT NOTE  Pharmacy Consult for Electrolyte Monitoring and Replacement   Recent Labs: Potassium (mmol/L)  Date Value  10/06/2023 3.2 (L)  12/31/2014 4.0   Magnesium  (mg/dL)  Date Value  40/98/1191 1.9   Calcium  (mg/dL)  Date Value  47/82/9562 7.1 (L)   Albumin  (g/dL)  Date Value  13/11/6576 2.3 (L)   Phosphorus (mg/dL)  Date Value  46/96/2952 2.2 (L)   Sodium (mmol/L)  Date Value  10/06/2023 137   Assessment: 73 y/o female presenting with altered mental status and hypoxia. PMH significant for bipolar disorder, breast cancer, anxiety, depression, hypothyroidism. Pharmacy is asked to follow and replace electrolytes while in CCU  Goal of Therapy:  Electrolytes WNL  Plan:  --K 3.2, Kcl 40 mEq per tube x 1 dose and Kcl 10 mEq IV q1h x 2 doses --Phos 2.2, potassium phosphate  infusion from overnight in process --Mg 1.9, magnesium  sulfate 2 g IV x 1 --Labs tomorrow  Selena Pittman 10/06/2023 7:48 AM

## 2023-10-06 NOTE — Progress Notes (Signed)
 Pt has been off sedation since around 11:15 yesterday morning, is now awake and alert, nods to questions, and able to follow simple commands (raises head off pillow, moves arms, and wiggles toes).  She has been in PSV 5/5 since about 8:45 am this morning.  RR currently 25-30, TV's ranging from 450 to 500.  Rapid shallow breathing index score of 70 indicative of successful weaning.  No significant secretions noted from ETT.  Discussed above with Dr. Aleskerov, he is in agreement with proceeding with Extubation.  Called and updated pt's son Liddy Deam of his mother's status and plan for extubation.  He is in agreement with trial of extubation, and should she fail, would want her re intubated.  Discussed with nursing    Cherylann Corpus, AGACNP-BC [facility] Prefer epic messenger for cross cover needs If after hours, please call E-link

## 2023-10-06 NOTE — Progress Notes (Signed)
 NAME:  Selena Pittman, MRN:  [id number], DOB:  12-19-1950, LOS: 8 ADMISSION DATE:  09/28/2023   History of Present Illness:  s. Selena Pittman is a 73 year old female with history of hypertension, on midodrine , GERD, insomnia, left breast cancer, anxiety, depression, hypothyroid, bipolar, who presents ED for chief concerns of hypotension and hypoxia at [facility] house.   Vitals in the ED showed t 98.3, respiration rate of 18, heart rate 112, blood pressure 83/64, improved to 99/67, SpO2 of 92% on 2 L nasal cannula.   Serum sodium is 139, potassium 3.0, chloride one 0.6, bicarb 23, BUN of 7, serum creatinine 0.35, EGFR greater than 60, nonfasting blood glucose 99, WBC 11.6, hemoglobin 12.2, platelets of 474.HS troponin is 38, on repeat is 49. COVID/influenza A/influenza B/RSV PCR were negative.Lactic acid is 1.3. Chest x-ray with concern of atypical pneumonia. CTA chest with small bilateral PE and bilateral infiltrate concerning for pneumonia CT head with no acute abnormality, age-related atrophy and moderate paranasal sinus disease.  CT abdomen and pelvis with concern of enterocolitis and fecal impaction in rectum.   ED treatment: Heparin  bolus and gtt., vancomycin , cefepime , sodium chloride  1.5 liter bolus, midodrine  2.5 mg p.o. one-time dose.  Pertinent  Medical History  As above  Significant Hospital Events: Including procedures, antibiotic start and stop dates in addition to other pertinent events   6/11: Vital stable, preliminary blood cultures negative.  MRI brain with no acute intracranial abnormality.  Mucosal thickening with air-fluid level in left sphenoid and maxillary sinuses more consistent with acute sinusitis. MRSA PCR positive so adding vancomycin . Lower extremity venous Doppler with extensive bilateral DVT, right greater than left.  Echocardiogram was normal. Vascular surgery was consulted to see if she is a candidate for any intervention or IVC filter placement.    6/12: Patient was little more lethargic this morning so ABG was done, it shows pH of 7.51, CO2 30 and mild hypoxia with pO2 of 65.  Placed on 2 L of oxygen.  Later she did woke up.  Procalcitonin negative so checking respiratory viral panel as he might be having viral pneumonia.  Urine cultures with ESBL E. coli-ordered 1 dose of fosfomycin. based on her debility-vascular evaluated her and she will not be a candidate for any intervention.  Heparin  is being switched with Xarelto . Chest PT ordered as she remained quite congested Mild hypokalemia and hypomagnesemia-replating electrolytes 6/13: Patient remained quite congested.  Respiratory viral panel positive for rhino and parainfluenza virus.  Mild hypophosphatemia today which is being repleted. 06/14:  Patient admitted to the ICU, intubated and bronched. Sputum sent for cultutre. 10/03/23: WBC trending up. ESBL in urine culture. Severe hypokalemia. Persistent shock.  10/04/23- patient remains on MV, hyperglycemic this am. Has increased resp secretions which are inspissated.  She remains on levophed  and vasopressin infusion.  She is heparin  infusion.  She is at risk of re-feeding syndrome due to moderate protein calorie malnutrition.  She had multiple bowel movements overnight.  She is currently being treated for multi drug resistant UTI with viral LRTI due to parainfulenza and rhinovirus.   Palliative care is following.  10/05/23- ESBL, MRSA, parainfluenza 3 and rhinoevirus positive, pharmcy consultant recommendation for zyvox  initiation, CVP monitoring, sedation req is high we are changing to ketamine from prop due to shock physiology.  She has sacral decubitus stage 3 present on admission.  10/06/23- Patient had no overnight events, continuing wake up asessment, reduced vasopressors by 80% plan to extubate when able. Wound care following. CVP 6  this am.    Objective    Blood pressure 93/66, pulse 81, temperature (!) 96.4 F (35.8 C), resp. rate (!) 22,  height 5' 5 (1.651 m), weight 55.3 kg, SpO2 97%. CVP:  [6 mmHg] 6 mmHg  Vent Mode: CPAP;PSV FiO2 (%):  [30 %] 30 % Set Rate:  [20 bmp] 20 bmp Vt Set:  [330 mL-350 mL] 350 mL PEEP:  [5 cmH20] 5 cmH20 Pressure Support:  [10 cmH20] 10 cmH20 Plateau Pressure:  [13 cmH20-17 cmH20] 13 cmH20   Intake/Output Summary (Last 24 hours) at 10/06/2023 0737 Last data filed at 10/06/2023 0600 Gross per 24 hour  Intake 6707.13 ml  Output 885 ml  Net 5822.13 ml   Filed Weights   10/02/23 1315 10/04/23 0341 10/05/23 0432  Weight: 45.1 kg 52.1 kg 55.3 kg    Examination: General: Chronically ill, cachectic , intubated and sedated.  HENT: Supple neck, reactive pupils  Lungs: Diffuse ronchi appreciated Cardiovascular: Normal S1, Normal S2, RRR  Abdomen: Soft, non tender, non distended, + BS  Extremities: Warm and well perfused   Labs and imaging were reviewed.   Micro  -- BAL 10/03/2023 with rare wbc present, no organisms seen.  -- Urine culture with ESBL Ecoli 09/28/2023    Assessment and Plan  Case of a 73 years old female patient with a past medical history of failure to thrive, hypertension, GERD, insomnia, remote history of breast cancer who is presenting from [place] nursing home to [facility] with acute hypoxic respiratory failure on 09/28/2023.  She was found to have rhinovirus and parainfluenza virus positive.  Course complicated by worsening acute hypoxic respiratory failure requiring intubation mechanical ventilation on 10/02/2023.  Status post bronchoscopy with extensive secretions bilaterally.  1 Acute hypoxic respiratory failure requiring intubation and mechanical ventilation on 10/02/2023 secondary to Rhinovirus and parainfluenza pneumonia with possible superimposed bacterial pneumonia.  MRSA swab positive. 2 Mucous plugging with extensive secretions secondary to the above  3. Shock-septic present on admission due to UTI with ecoli           UTI with ESBL E.Coli 06/10  4 Extensive  bilateral DVTs and PE (secondary to DVT due to immobilization and current inflammatory process) PE of low risk ft  5  Severe protein calorie malnutrition - refeeding syndrome -monitoring and correcting electrolytes 6.  Sacral decubitus stge 3 -present on admission - patient is bedbound at baseline, wound care consultation  # Bedbound at baseline # Hypertension, hyperlipidemia, insomnia  Neuro: Propofol  for RASS 0 to -1.  Fentanyl  for CPOT less than 2. CVS: Nor epi for MAP greater than 65. Add vasopressin and stress dose steroids. Heparin  drip.  Pulmonary: heavy phlegm with MV sounds GI: Start trickle feeds. Aaron Aas PPI for prohylaxis,  Renal: Avoid nephrotoxic agents. Monitor UOP.  Heme: Heparin  drip for PE/DVT.  Endo: POC goal 140-180.    Best Practice (right click and Reselect all SmartList Selections daily)   Diet/type: Trickle feed DVT prophylaxis systemic heparin  Pressure ulcer(s): N/A GI prophylaxis: PPI Lines: Left internal jugular CVC, Left radial A-line Foley:  Yes 06/14 Code Status:  full code  Critical care provider statement:   Total critical care time: 63 minutes   Performed by: Jaclynn Mast MD   Critical care time was exclusive of separately billable procedures and treating other patients.   Critical care was necessary to treat or prevent imminent or life-threatening deterioration.   Critical care was time spent personally by me on the following activities: development of treatment plan with patient  and/or surrogate as well as nursing, discussions with consultants, evaluation of patient's response to treatment, examination of patient, obtaining history from patient or surrogate, ordering and performing treatments and interventions, ordering and review of laboratory studies, ordering and review of radiographic studies, pulse oximetry and re-evaluation of patient's condition.    Sheila Gervasi, M.D.  Pulmonary & Critical Care Medicine

## 2023-10-06 NOTE — Plan of Care (Signed)
  Problem: Clinical Measurements: Goal: Respiratory complications will improve Outcome: Progressing Goal: Cardiovascular complication will be avoided Outcome: Progressing   Problem: Nutrition: Goal: Adequate nutrition will be maintained Outcome: Progressing   Problem: Safety: Goal: Ability to remain free from injury will improve Outcome: Progressing   Problem: Fluid Volume: Goal: Ability to maintain a balanced intake and output will improve Outcome: Progressing   Problem: Fluid Volume: Goal: Ability to maintain a balanced intake and output will improve Outcome: Progressing   Problem: Nutritional: Goal: Maintenance of adequate nutrition will improve Outcome: Progressing

## 2023-10-06 NOTE — Plan of Care (Signed)
  Problem: Clinical Measurements: Goal: Respiratory complications will improve Outcome: Progressing Goal: Cardiovascular complication will be avoided Outcome: Progressing   Problem: Nutrition: Goal: Adequate nutrition will be maintained Outcome: Progressing   Problem: Coping: Goal: Level of anxiety will decrease Outcome: Progressing   Problem: Metabolic: Goal: Ability to maintain appropriate glucose levels will improve Outcome: Progressing   Problem: Nutritional: Goal: Maintenance of adequate nutrition will improve Outcome: Progressing   Problem: Health Behavior/Discharge Planning: Goal: Ability to manage health-related needs will improve Outcome: Not Progressing   Problem: Clinical Measurements: Goal: Will remain free from infection Outcome: Not Progressing Goal: Diagnostic test results will improve Outcome: Not Progressing   Problem: Activity: Goal: Risk for activity intolerance will decrease Outcome: Not Progressing   Problem: Elimination: Goal: Will not experience complications related to bowel motility Outcome: Not Progressing Goal: Will not experience complications related to urinary retention Outcome: Not Progressing   Problem: Pain Managment: Goal: General experience of comfort will improve and/or be controlled Outcome: Not Progressing   Problem: Skin Integrity: Goal: Risk for impaired skin integrity will decrease Outcome: Not Progressing   Problem: Skin Integrity: Goal: Risk for impaired skin integrity will decrease Outcome: Not Progressing

## 2023-10-07 ENCOUNTER — Inpatient Hospital Stay

## 2023-10-07 DIAGNOSIS — R531 Weakness: Secondary | ICD-10-CM | POA: Diagnosis not present

## 2023-10-07 DIAGNOSIS — A419 Sepsis, unspecified organism: Secondary | ICD-10-CM | POA: Diagnosis not present

## 2023-10-07 DIAGNOSIS — R918 Other nonspecific abnormal finding of lung field: Secondary | ICD-10-CM | POA: Diagnosis not present

## 2023-10-07 DIAGNOSIS — I2699 Other pulmonary embolism without acute cor pulmonale: Secondary | ICD-10-CM | POA: Diagnosis not present

## 2023-10-07 DIAGNOSIS — A4101 Sepsis due to Methicillin susceptible Staphylococcus aureus: Secondary | ICD-10-CM | POA: Diagnosis not present

## 2023-10-07 DIAGNOSIS — J189 Pneumonia, unspecified organism: Secondary | ICD-10-CM | POA: Diagnosis not present

## 2023-10-07 DIAGNOSIS — J9601 Acute respiratory failure with hypoxia: Secondary | ICD-10-CM | POA: Diagnosis not present

## 2023-10-07 LAB — BLOOD GAS, ARTERIAL
Acid-base deficit: 0.6 mmol/L (ref 0.0–2.0)
Bicarbonate: 25.4 mmol/L (ref 20.0–28.0)
FIO2: 35 %
MECHVT: 350 mL
Mechanical Rate: 20
O2 Saturation: 99.5 %
PEEP: 5 cmH2O
Patient temperature: 37
pCO2 arterial: 46 mmHg (ref 32–48)
pH, Arterial: 7.35 (ref 7.35–7.45)
pO2, Arterial: 131 mmHg — ABNORMAL HIGH (ref 83–108)

## 2023-10-07 LAB — CBC
HCT: 23.4 % — ABNORMAL LOW (ref 36.0–46.0)
Hemoglobin: 7.8 g/dL — ABNORMAL LOW (ref 12.0–15.0)
MCH: 31.2 pg (ref 26.0–34.0)
MCHC: 33.3 g/dL (ref 30.0–36.0)
MCV: 93.6 fL (ref 80.0–100.0)
Platelets: 229 10*3/uL (ref 150–400)
RBC: 2.5 MIL/uL — ABNORMAL LOW (ref 3.87–5.11)
RDW: 16.6 % — ABNORMAL HIGH (ref 11.5–15.5)
WBC: 11.8 10*3/uL — ABNORMAL HIGH (ref 4.0–10.5)
nRBC: 0 % (ref 0.0–0.2)

## 2023-10-07 LAB — BASIC METABOLIC PANEL WITH GFR
Anion gap: 7 (ref 5–15)
BUN: 14 mg/dL (ref 8–23)
CO2: 24 mmol/L (ref 22–32)
Calcium: 7.5 mg/dL — ABNORMAL LOW (ref 8.9–10.3)
Chloride: 107 mmol/L (ref 98–111)
Creatinine, Ser: <0.3 mg/dL — ABNORMAL LOW (ref 0.44–1.00)
Glucose, Bld: 154 mg/dL — ABNORMAL HIGH (ref 70–99)
Potassium: 4.8 mmol/L (ref 3.5–5.1)
Sodium: 138 mmol/L (ref 135–145)

## 2023-10-07 LAB — RENAL FUNCTION PANEL
Albumin: 2.9 g/dL — ABNORMAL LOW (ref 3.5–5.0)
Anion gap: 5 (ref 5–15)
BUN: 13 mg/dL (ref 8–23)
CO2: 24 mmol/L (ref 22–32)
Calcium: 7.3 mg/dL — ABNORMAL LOW (ref 8.9–10.3)
Chloride: 106 mmol/L (ref 98–111)
Creatinine, Ser: <0.3 mg/dL — ABNORMAL LOW (ref 0.44–1.00)
Glucose, Bld: 206 mg/dL — ABNORMAL HIGH (ref 70–99)
Phosphorus: 2.9 mg/dL (ref 2.5–4.6)
Potassium: 4 mmol/L (ref 3.5–5.1)
Sodium: 135 mmol/L (ref 135–145)

## 2023-10-07 LAB — LACTIC ACID, PLASMA: Lactic Acid, Venous: 1.6 mmol/L (ref 0.5–1.9)

## 2023-10-07 LAB — GLUCOSE, CAPILLARY
Glucose-Capillary: 123 mg/dL — ABNORMAL HIGH (ref 70–99)
Glucose-Capillary: 144 mg/dL — ABNORMAL HIGH (ref 70–99)
Glucose-Capillary: 171 mg/dL — ABNORMAL HIGH (ref 70–99)
Glucose-Capillary: 172 mg/dL — ABNORMAL HIGH (ref 70–99)
Glucose-Capillary: 179 mg/dL — ABNORMAL HIGH (ref 70–99)
Glucose-Capillary: 209 mg/dL — ABNORMAL HIGH (ref 70–99)
Glucose-Capillary: 98 mg/dL (ref 70–99)

## 2023-10-07 LAB — BLOOD GAS, VENOUS
Acid-Base Excess: 1.7 mmol/L (ref 0.0–2.0)
Acid-Base Excess: 0.3 mmol/L (ref 0.0–2.0)
Bicarbonate: 25.8 mmol/L (ref 20.0–28.0)
Bicarbonate: 27.3 mmol/L (ref 20.0–28.0)
O2 Saturation: 63.8 %
O2 Saturation: 61 %
Patient temperature: 37
Patient temperature: 37
pCO2, Ven: 38 mmHg — ABNORMAL LOW (ref 44–60)
pCO2, Ven: 53 mmHg (ref 44–60)
pH, Ven: 7.44 — ABNORMAL HIGH (ref 7.25–7.43)
pH, Ven: 7.32 (ref 7.25–7.43)
pO2, Ven: 36 mmHg (ref 32–45)
pO2, Ven: 35 mmHg (ref 32–45)

## 2023-10-07 LAB — CULTURE, RESPIRATORY W GRAM STAIN

## 2023-10-07 LAB — HEPARIN LEVEL (UNFRACTIONATED): Heparin Unfractionated: 0.37 [IU]/mL (ref 0.30–0.70)

## 2023-10-07 LAB — CULTURE, BAL-QUANTITATIVE W GRAM STAIN: Culture: 5000 — AB

## 2023-10-07 LAB — MAGNESIUM: Magnesium: 2 mg/dL (ref 1.7–2.4)

## 2023-10-07 MED ORDER — POLYETHYLENE GLYCOL 3350 17 G PO PACK: 17.0000 g | PACK | Freq: Every day | ORAL | Status: DC | PRN

## 2023-10-07 MED ORDER — PROPOFOL 1000 MG/100ML IV EMUL
0.0000 ug/kg/min | INTRAVENOUS | Status: DC
  Administered 2023-10-07: 5 ug/kg/min via INTRAVENOUS
  Administered 2023-10-08 – 2023-10-10 (×5): 15 ug/kg/min via INTRAVENOUS
  Filled 2023-10-07 (×6): qty 100

## 2023-10-07 MED ORDER — MIDAZOLAM HCL 2 MG/2ML IJ SOLN
1.0000 mg | INTRAMUSCULAR | Status: DC | PRN
  Administered 2023-10-07: 2 mg via INTRAVENOUS
  Filled 2023-10-07: qty 2

## 2023-10-07 MED ORDER — VECURONIUM BROMIDE 10 MG IV SOLR
INTRAVENOUS | Status: AC
  Filled 2023-10-07: qty 10

## 2023-10-07 MED ORDER — FENTANYL CITRATE PF 50 MCG/ML IJ SOSY
25.0000 ug | PREFILLED_SYRINGE | INTRAMUSCULAR | Status: DC | PRN
  Administered 2023-10-07 (×4): 50 ug via INTRAVENOUS
  Administered 2023-10-07: 100 ug via INTRAVENOUS
  Administered 2023-10-09: 50 ug via INTRAVENOUS
  Administered 2023-10-11 (×2): 100 ug via INTRAVENOUS
  Filled 2023-10-07: qty 1
  Filled 2023-10-07 (×2): qty 2
  Filled 2023-10-07: qty 1
  Filled 2023-10-07 (×2): qty 2
  Filled 2023-10-07 (×3): qty 1

## 2023-10-07 MED ORDER — FAMOTIDINE 20 MG PO TABS: 20.0000 mg | ORAL_TABLET | Freq: Two times a day (BID) | ORAL | Status: DC

## 2023-10-07 MED ORDER — CEFAZOLIN SODIUM-DEXTROSE 2-4 GM/100ML-% IV SOLN
2.0000 g | Freq: Three times a day (TID) | INTRAVENOUS | Status: AC
  Administered 2023-10-08 – 2023-10-09 (×6): 2 g via INTRAVENOUS
  Filled 2023-10-07 (×6): qty 100

## 2023-10-07 MED ORDER — FUROSEMIDE 10 MG/ML IJ SOLN
20.0000 mg | Freq: Once | INTRAMUSCULAR | Status: AC
  Administered 2023-10-07: 20 mg via INTRAVENOUS
  Filled 2023-10-07: qty 2

## 2023-10-07 MED ORDER — POLYETHYLENE GLYCOL 3350 17 G PO PACK: 17.0000 g | PACK | Freq: Every day | ORAL | Status: DC

## 2023-10-07 MED ORDER — VECURONIUM BROMIDE 10 MG IV SOLR: 10.0000 mg | Freq: Once | INTRAVENOUS | Status: DC

## 2023-10-07 MED ORDER — FENTANYL CITRATE PF 50 MCG/ML IJ SOSY
25.0000 ug | PREFILLED_SYRINGE | INTRAMUSCULAR | Status: DC | PRN
  Administered 2023-10-07: 25 ug via INTRAVENOUS
  Filled 2023-10-07: qty 1

## 2023-10-07 MED ORDER — MIDAZOLAM HCL 2 MG/2ML IJ SOLN
INTRAMUSCULAR | Status: AC
  Administered 2023-10-07: 2 mg via INTRAVENOUS
  Filled 2023-10-07: qty 4

## 2023-10-07 MED ORDER — MIDAZOLAM HCL 2 MG/2ML IJ SOLN: 4.0000 mg | Freq: Once | INTRAMUSCULAR | Status: AC

## 2023-10-07 MED ORDER — DOCUSATE SODIUM 50 MG/5ML PO LIQD: 100.0000 mg | Freq: Two times a day (BID) | ORAL | Status: DC

## 2023-10-07 MED ORDER — ETOMIDATE 2 MG/ML IV SOLN
20.0000 mg | Freq: Once | INTRAVENOUS | Status: AC
  Administered 2023-10-07: 20 mg via INTRAVENOUS
  Filled 2023-10-07: qty 10

## 2023-10-07 MED ORDER — STERILE WATER FOR INJECTION IJ SOLN
INTRAMUSCULAR | Status: AC
  Filled 2023-10-07: qty 10

## 2023-10-07 NOTE — Procedures (Signed)
 Intubation Procedure Note  Natilee Berlanga  [id number]  June 10, 1950  Date:10/07/23  Time:6:53 AM   Provider Performing:Devon Pretty L Rust-Chester    Procedure: Intubation (31500)  Indication(s) Respiratory Failure  Consent Unable to obtain consent due to emergent nature of procedure.   Anesthesia Etomidate and Versed    Time Out Verified patient identification, verified procedure, site/side was marked, verified correct patient position, special equipment/implants available, medications/allergies/relevant history reviewed, required imaging and test results available.   Sterile Technique Usual hand hygeine, masks, and gloves were used   Procedure Description Patient positioned in bed supine.  Sedation given as noted above.  Patient was intubated with endotracheal tube using Glidescope.  View was Grade 1 full glottis .  Number of attempts was 1.  Colorimetric CO2 detector was consistent with tracheal placement.   Complications/Tolerance None; patient tolerated the procedure well. Chest X-ray is ordered to verify placement.   EBL Minimal   Specimen(s) None   Eliott Guess, AGACNP-BC Acute Care Nurse Practitioner [facility]   [phone] / [phone] Please see Amion for details.

## 2023-10-07 NOTE — TOC Progression Note (Signed)
 Transition of Care [facility]) - Progression Note    Patient Details  Name: Selena Pittman MRN: [id number] Date of Birth: Jan 11, 1951  Transition of Care [facility]) CM/SW Contact  Tamiyah Moulin A Maryah Marinaro, RN Phone Number: 10/07/2023, 2:09 PM  Clinical Narrative:    Chart reviewed. I have spoken with patient's son Landon Pinion.  He informs  me that DSS is now the legal guardian of the patient.  He informs me that their was a hearing and DSS will not represent Mrs. Odaniel.   I have spoken with Kerwin Peels with [facility] DSS and she informs me that patient just became the guardian of the patient on Tuesday June 17 th 2025.  LaPorshia reports that patient guardian will be Alexander Iba with [facility] DSS.  Her contact number is [phone].  The after hours number is [phone] and as to speak with the SW on call for guardianship.  LaPorshia will email me the a copy of guardianship paperwork.    LaPorshia informed me that the son is no longer about to make medical decisions for the patient and to reach out to Alexander Iba patient's guardian.   I have made Dr. Jaclynn Mast and NP Lina Render aware.   TOC will continue to follow for discharge planning.     Expected Discharge Plan: Skilled Nursing Facility Barriers to Discharge: Continued Medical Work up  Expected Discharge Plan and Services       Living arrangements for the past 2 months: Skilled Nursing Facility                                       Social Determinants of Health (SDOH) Interventions SDOH Screenings   Food Insecurity: No Food Insecurity (10/29/2022)  Housing: Low Risk  (10/29/2022)  Transportation Needs: No Transportation Needs (10/29/2022)  Utilities: Not At Risk (10/29/2022)  Social Connections: Patient Unable To Answer (09/29/2023)  Tobacco Use: High Risk (09/28/2023)    Readmission Risk Interventions    09/30/2023   10:38 AM 07/30/2023   12:52 PM  Readmission Risk Prevention Plan  Transportation Screening Complete  Complete  PCP or Specialist Appt within 3-5 Days Complete   HRI or Home Care Consult  Complete  Social Work Consult for Recovery Care Planning/Counseling Complete   Palliative Care Screening Not Applicable   Medication Review Oceanographer) Complete Complete

## 2023-10-07 NOTE — Progress Notes (Signed)
 PHARMACY - ANTICOAGULATION CONSULT NOTE  Pharmacy Consult for IV Heparin  Indication: pulmonary embolus  Patient Measurements: Height: 5' 5 (165.1 cm) Weight: 54.6 kg (120 lb 5.9 oz) IBW/kg (Calculated) : 57 HEPARIN  DW (KG): 45.1  Labs: Recent Labs    10/04/23 0846 10/04/23 1449 10/04/23 1614 10/05/23 0429 10/05/23 1244 10/05/23 2252 10/06/23 0643 10/06/23 1052 10/07/23 0540  HGB  --   --    < > 8.0*  --   --   --  7.4* 7.8*  HCT  --   --    < > 23.4*  --   --   --  21.9* 23.4*  PLT  --   --   --  255  --   --   --  237 229  APTT 67*  --   --   --   --   --   --   --   --   HEPARINUNFRC 0.13*  --    < > 0.31   < > 0.34 0.40  --  0.37  CREATININE  --   --   --  <0.30*  --   --  <0.30*  --   --   TROPONINIHS  --  14  --   --   --   --   --   --   --    < > = values in this interval not displayed.    CrCl cannot be calculated (This lab value cannot be used to calculate CrCl because it is not a number: <0.30).   Medical History: Past Medical History:  Diagnosis Date   Bipolar affective (HCC)    Breast cancer (HCC)    Cancer (HCC) 12/11/2014   INVASIVE MAMMARY CARCINOMA/.left breast/ T2 N0   Cardiomegaly    Depression    Emphysema of lung (HCC)    Endometriosis    Hyperlipidemia    Osteoporosis    Schizoaffective disorder (HCC)    Schizophrenia (HCC)    Severe sepsis (HCC) 04/02/2022   Thyroid  nodule    Vitamin D  deficiency    Assessment: 73 y/o female presenting with altered mental status and hypoxia. PMH significant for bipolar disorder, breast cancer, anxiety, depression, hypothyroidism. CT imaging shows pulmonary embolism.Per chart review, patient is not on anticoagulation prior to admission. Pharmacy has been consulted to re- initiate and manage heparin  infusion.  Patient started Xarelto  (last dose 06/14 0825). Now transitioned back to heparin  drip.  Goal of Therapy:  Heparin  level 0.3-0.7 units/ml aPTT 66 - 102s Monitor platelets by anticoagulation  protocol: Yes  06/16 0846 HL 0.13, aPTT 67s, subthera; 650 un/hr 06/16 2023 HL 0.24, subthera; 750 un/hr 06/17 0429 HL 0.31, therapeutic x 1 06/17 1244 HL 0.28, subthera; 850 un/hr 06/17 2252 HL 0.34, therapeutic x 1 06/18 0643 HL 0.40, therapeutic x 2 06/19 0540 HL 0.37, therapeutic X 3   Plan:  --Heparin  level is therapeutic x 3 --Continue heparin  infusion at 950 units/hr --Re-check HL & CBC tomorrow AM  Thank you for involving pharmacy in this patient's care.   Ranada Vigorito D 10/07/2023 6:11 AM

## 2023-10-07 NOTE — Progress Notes (Signed)
 Updated pt's daughter Burdette Carolin at bedside on clinical status and plan of care.  All questions answered.      Cherylann Corpus, AGACNP-BC [facility] Prefer epic messenger for cross cover needs If after hours, please call E-link

## 2023-10-07 NOTE — Progress Notes (Signed)
 NAME:  Anahita Cua, MRN:  [id number], DOB:  1950/05/24, LOS: 9 ADMISSION DATE:  09/28/2023   History of Present Illness:  s. Cassanda Glazebrook is a 73 year old female with history of hypertension, on midodrine , GERD, insomnia, left breast cancer, anxiety, depression, hypothyroid, bipolar, who presents ED for chief concerns of hypotension and hypoxia at [facility] house.   Vitals in the ED showed t 98.3, respiration rate of 18, heart rate 112, blood pressure 83/64, improved to 99/67, SpO2 of 92% on 2 L nasal cannula.   Serum sodium is 139, potassium 3.0, chloride one 0.6, bicarb 23, BUN of 7, serum creatinine 0.35, EGFR greater than 60, nonfasting blood glucose 99, WBC 11.6, hemoglobin 12.2, platelets of 474.HS troponin is 38, on repeat is 49. COVID/influenza A/influenza B/RSV PCR were negative.Lactic acid is 1.3. Chest x-ray with concern of atypical pneumonia. CTA chest with small bilateral PE and bilateral infiltrate concerning for pneumonia CT head with no acute abnormality, age-related atrophy and moderate paranasal sinus disease.  CT abdomen and pelvis with concern of enterocolitis and fecal impaction in rectum.   ED treatment: Heparin  bolus and gtt., vancomycin , cefepime , sodium chloride  1.5 liter bolus, midodrine  2.5 mg p.o. one-time dose.  Pertinent  Medical History  As above  Significant Hospital Events: Including procedures, antibiotic start and stop dates in addition to other pertinent events   6/11: Vital stable, preliminary blood cultures negative.  MRI brain with no acute intracranial abnormality.  Mucosal thickening with air-fluid level in left sphenoid and maxillary sinuses more consistent with acute sinusitis. MRSA PCR positive so adding vancomycin . Lower extremity venous Doppler with extensive bilateral DVT, right greater than left.  Echocardiogram was normal. Vascular surgery was consulted to see if she is a candidate for any intervention or IVC filter placement.    6/12: Patient was little more lethargic this morning so ABG was done, it shows pH of 7.51, CO2 30 and mild hypoxia with pO2 of 65.  Placed on 2 L of oxygen.  Later she did woke up.  Procalcitonin negative so checking respiratory viral panel as he might be having viral pneumonia.  Urine cultures with ESBL E. coli-ordered 1 dose of fosfomycin. based on her debility-vascular evaluated her and she will not be a candidate for any intervention.  Heparin  is being switched with Xarelto . Chest PT ordered as she remained quite congested Mild hypokalemia and hypomagnesemia-replating electrolytes 6/13: Patient remained quite congested.  Respiratory viral panel positive for rhino and parainfluenza virus.  Mild hypophosphatemia today which is being repleted. 06/14:  Patient admitted to the ICU, intubated and bronched. Sputum sent for cultutre. 10/03/23: WBC trending up. ESBL in urine culture. Severe hypokalemia. Persistent shock.  10/04/23- patient remains on MV, hyperglycemic this am. Has increased resp secretions which are inspissated.  She remains on levophed  and vasopressin infusion.  She is heparin  infusion.  She is at risk of re-feeding syndrome due to moderate protein calorie malnutrition.  She had multiple bowel movements overnight.  She is currently being treated for multi drug resistant UTI with viral LRTI due to parainfulenza and rhinovirus.   Palliative care is following.  10/05/23- ESBL, MRSA, parainfluenza 3 and rhinoevirus positive, pharmcy consultant recommendation for zyvox  initiation, CVP monitoring, sedation req is high we are changing to ketamine from prop due to shock physiology.  She has sacral decubitus stage 3 present on admission.  10/06/23- Patient had no overnight events, continuing wake up asessment, reduced vasopressors by 80% plan to extubate when able. Wound care following. CVP 6  this am.  10/07/23- patient deteriorated overnight with requirement of re-intubation after discussion with her son.   She at Rass negative 1.  Prognosis is poor, palliative care is following. Continuing to treat MSSA CAP and parainfluenza infection.  She also had ecoli UTI and has completed therapy for this.   Objective    Blood pressure 100/75, pulse (!) 110, temperature 97.6 F (36.4 C), temperature source Axillary, resp. rate (!) 25, height 5' 5 (1.651 m), weight 60.4 kg, SpO2 100%. CVP:  [5 mmHg-40 mmHg] 23 mmHg  Vent Mode: PRVC FiO2 (%):  [30 %-100 %] 35 % Set Rate:  [20 bmp] 20 bmp Vt Set:  [350 mL] 350 mL PEEP:  [5 cmH20] 5 cmH20 Pressure Support:  [5 cmH20] 5 cmH20 Plateau Pressure:  [18 cmH20] 18 cmH20   Intake/Output Summary (Last 24 hours) at 10/07/2023 1014 Last data filed at 10/07/2023 0900 Gross per 24 hour  Intake 5121.64 ml  Output 1200 ml  Net 3921.64 ml   Filed Weights   10/05/23 0432 10/06/23 0712 10/07/23 0500  Weight: 55.3 kg 54.6 kg 60.4 kg    Examination: General: Chronically ill, cachectic , intubated and sedated.  HENT: Supple neck, reactive pupils  Lungs: Diffuse ronchi appreciated Cardiovascular: Normal S1, Normal S2, RRR  Abdomen: Soft, non tender, non distended, + BS  Extremities: Warm and well perfused   Labs and imaging were reviewed.   Micro  -- BAL 10/03/2023 with rare wbc present, no organisms seen.  -- Urine culture with ESBL Ecoli 09/28/2023    Assessment and Plan  Case of a 73 years old female patient with a past medical history of failure to thrive, hypertension, GERD, insomnia, remote history of breast cancer who is presenting from [place] nursing home to [facility] with acute hypoxic respiratory failure on 09/28/2023.  She was found to have rhinovirus and parainfluenza virus positive.  Course complicated by worsening acute hypoxic respiratory failure requiring intubation mechanical ventilation on 10/02/2023.  Status post bronchoscopy with extensive secretions bilaterally.  1 Acute hypoxic respiratory failure requiring intubation and mechanical  ventilation on 10/02/2023 secondary to Rhinovirus and parainfluenza pneumonia with possible superimposed bacterial pneumonia MSSA and UTI elcoli.  MRSA swab positive. 2 Mucous plugging with extensive secretions secondary to the above  3. Shock-septic present on admission due to UTI with ecoli           UTI with ESBL E.Coli 06/10 , MSSA pneumonia and viral LRTI with rhinovirus/parainfluena 4 Extensive bilateral DVTs and PE (secondary to DVT due to immobilization and current inflammatory process) PE of low risk ft  5  Severe protein calorie malnutrition - refeeding syndrome -monitoring and correcting electrolytes 6.  Sacral decubitus stge 3 -present on admission - patient is bedbound at baseline, wound care consultation  # Bedbound at baseline # Hypertension, hyperlipidemia, insomnia  Neuro: Propofol  for RASS 0 to -1.  Fentanyl  for CPOT less than 2. CVS: Nor epi for MAP greater than 65. Add vasopressin and stress dose steroids. Heparin  drip.  Pulmonary: heavy phlegm with MV sounds GI: Start trickle feeds. Aaron Aas PPI for prohylaxis,  Renal: Avoid nephrotoxic agents. Monitor UOP.  Heme: Heparin  drip for PE/DVT.  Endo: POC goal 140-180.    Best Practice (right click and Reselect all SmartList Selections daily)   Diet/type: Trickle feed DVT prophylaxis systemic heparin  Pressure ulcer(s): N/A GI prophylaxis: PPI Lines: Left internal jugular CVC, Left radial A-line Foley:  Yes 06/14 Code Status:  full code  Critical care provider statement:  Total critical care time: 32 minutes   Performed by: Jaclynn Mast MD   Critical care time was exclusive of separately billable procedures and treating other patients.   Critical care was necessary to treat or prevent imminent or life-threatening deterioration.   Critical care was time spent personally by me on the following activities: development of treatment plan with patient and/or surrogate as well as nursing, discussions with consultants,  evaluation of patient's response to treatment, examination of patient, obtaining history from patient or surrogate, ordering and performing treatments and interventions, ordering and review of laboratory studies, ordering and review of radiographic studies, pulse oximetry and re-evaluation of patient's condition.    Kalianne Fetting, M.D.  Pulmonary & Critical Care Medicine

## 2023-10-07 NOTE — Progress Notes (Signed)
 Palliative Care Progress Note, Assessment & Plan   Patient Name: Selena Pittman       Date: 10/07/2023 DOB: 05/08/1950  Age: 73 y.o. MRN#: [id number] Attending Physician: Erskin Hearing, MD Primary Care Physician: Sharyne Degree, FNP Admit Date: 09/28/2023  Subjective: Patient is lying in bed, intubated with Levophed  support.  RN is at bedside during my visit.  No family or friends present during my visit.  HPI: 73 y.o. female  with past medical history significant for hypotension on midodrine , HLD, left breast cancer, bipolar, anxiety, depression, schizophrenia, schizoaffective disorder, and previous suicide attempt.  She presented to ED via EMS 09/28/2023 from [facility] healthcare complaining of lethargy, hypotension and hypoxia.  EMS reported patient had temp of 99.6 axillary and worsening cough. ED workup showed Na+ 139, K+ 3.0, bicarb 23, BUN 7, creatinine 0.35, GFR > 60, WBC 11.6, Hgb 12.2, platelets 474, troponin 38 => 49.  COVID/flu A-B/RSV negative.  Lactic 1.3. BP 99/67, HR 112, RR 18, SpO2 92% on 2 L O2 and 98.3.   Patient was admitted to [facility] for management of severe sepsis 2/2 PNA, pyuria, multilobar lung infiltrate, acute PE and severe protein calorie malnutrition.   PMT was consulted for assistance with goals of care discussion.   Patient status continued to worsen leading to rapid response being called 10/02/2023 requiring emergent intubation and transfer to ICU.  PMT was unable to complete consult due to this event.   Dr. Lucina Sabal had long discussion with family at the bedside including CODE STATUS and prognosis 10/02/2023.  [name] (son) at that time reports he was not comfortable making patient a DNR and wanted to exhaust all options.   6/18 - extubated 6/19 - reintubated overnight    Summary of counseling/coordination of care: Extensive chart review completed prior to meeting patient including labs, vital signs, imaging, progress notes, orders, and available advanced directive documents from current and previous encounters.   After reviewing the patient's chart and assessing the patient at bedside, I counseled with dayshift RN.  RN in agreement to reach out to PMT if family should come bedside.  Additionally, RN shared that patient's daughter wishes to not be contacted at work via phone call.  I then spoke with patient's son Siegfried Dress over the phone.  Medical update given.  Discussed patient was reintubated overnight,multiple organ failure, ventilatory support, pressor support, and overall poor prognosis.    Spaced opportunity provided for Siegfried Dress to share his thoughts and emotions regarding patient's current medical situation.  He shares that patient had commented to him in the past that she would want to be resuscitated.  He shares he is struggling to make any sort of decision for her and that is why he has turned overall decision making to [facility] DSS.  He shares that there was a hearing earlier this week that now has officially/legally moved guardianship from him to DSS.  However, he does not know who it DSS is now in charge of her decision making.  I reached out to [facility] for point of contact at [facility] DSS.  No adjustment to plan of care at this time.  PMT will continue to follow and support. I spoke with patient in regards  to symptom management and goals of care.   Physical Exam Vitals reviewed.  Constitutional:      General: She is not in acute distress. HENT:     Head: Normocephalic.   Cardiovascular:     Rate and Rhythm: Tachycardia present.  Pulmonary:     Comments: Ventilatory support  Skin:    General: Skin is dry.     Coloration: Skin is pale.             Total Time 35 minutes   Time spent includes: Detailed review of medical records (labs,  imaging, vital signs), medically appropriate exam (mental status, respiratory, cardiac, skin), discussed with treatment team, counseling and educating patient, family and staff, documenting clinical information, medication management and coordination of care.  Judeen Nose L. Rebbeca Campi, DNP, FNP-BC Palliative Medicine Team

## 2023-10-07 NOTE — Progress Notes (Signed)
 Nutrition Follow-up  DOCUMENTATION CODES:   Underweight, Severe malnutrition in context of chronic illness  INTERVENTION:   Resume Osmolite 1.2@55ml /hr continuous   Free water flushes 30ml q4 hours to maintain tube patency   Regimen provides 1584kcal/day, 73g/day protein and 1262ml/day of free water.   Pt remains at high refeed risk; recommend monitor potassium, magnesium  and phosphorus labs daily until stable  MVI daily via tube  Vitamin C  500mg  BID via tube   Thiamine  100mg  daily via tube   Daily weights   Vitamin A 50,000 units po daily x 2 weeks once initiated on a oral diet   NUTRITION DIAGNOSIS:   Severe Malnutrition related to social / environmental circumstances as evidenced by severe fat depletion, severe muscle depletion, percent weight loss. -ongoing   GOAL:   Provide needs based on ASPEN/SCCM guidelines -met   MONITOR:   Vent status, Labs, Weight trends, TF tolerance, I & O's, Skin  ASSESSMENT:   73 y/o female with h/o emphysema, MDD, anxiety, chronic constipation, GERD, schizophrenia, bipolar disorder, HLD, bedbound and resides at SNF, breast cancer s/p mastectomy (2016), hypothyroidism and necrotizing pneumonia who is admitted with UTI, PNA, septic shock, fecal impaction, rhinovirus and parainfluenza, BLE DVTs and PE.  Pt re-intubated today. Dobhoff tube remains in place and pt is tolerating tube feeds at goal rate. Refeed labs improved. No diarrhea noted today. Per chart, pt is up ~34lbs from her UBW. Pt +22L on her I & Os. Anasarca noted. Pt did receive a dose of lasix this morning.    Medications reviewed and include: vitamin C , solu-cortef, insulin , MVI, protonix , thiamine , albumin , heparin , meropenem , levophed , vasopressin   Labs reviewed: K 4.0 wnl, creat <0.30(L), P 2.9 wnl, Mg 2.0 wnl Vitamin A- 6.3(L)- 6/12 Wbc- 11.8(H), Hgb 7.8(L), Hct 23.4(L) Cbgs- 179, 209, 172 x 24 hrs   Patient is currently intubated on ventilator support MV: 12.6  L/min Temp (24hrs), Avg:96 F (35.6 C), Min:92.5 F (33.6 C), Max:97.7 F (36.5 C)  UOP-   MAP >39mmHg   Diet Order:   Diet Order             Diet NPO time specified  Diet effective now                  EDUCATION NEEDS:   Education needs have been addressed  Skin:  Skin Assessment: Reviewed RN Assessment (Stage II sacrum, rectal prolaspe) Skin Integrity Issues:: Stage II Stage II: sacrum  Last BM:  6/18- type 7  Height:   Ht Readings from Last 1 Encounters:  10/05/23 5' 5 (1.651 m)    Weight:   Wt Readings from Last 1 Encounters:  10/07/23 60.4 kg    Ideal Body Weight:  56.8 kg  BMI:  Body mass index is 22.16 kg/m.  Estimated Nutritional Needs:   Kcal:  1400-1600kcal/day  Protein:  70-80g/day  Fluid:  1.1-1.3L/day  Torrance Freestone MS, RD, LDN If unable to be reached, please send secure chat to RD inpatient available from 8:00a-4:00p daily

## 2023-10-07 NOTE — Plan of Care (Signed)
 The patient has continued to be non responsive to painful stimulation. The patient spontaneously would open her eyes at the beginning of the shift and has a gag reflex when suctioned. Propofol  has been initiated as the patient respirations would maintain above 30 breaths per minute when spontaneously opening her eyes. Daughter was at bedside and spoke with ICU providers today.   Problem:The Education: Goal: Knowledge of General Education information will improve Description: Including pain rating scale, medication(s)/side effects and non-pharmacologic comfort measures Outcome: Not Progressing   Problem: Health Behavior/Discharge Planning: Goal: Ability to manage health-related needs will improve Outcome: Not Progressing   Problem: Clinical Measurements: Goal: Ability to maintain clinical measurements within normal limits will improve Outcome: Not Progressing Goal: Will remain free from infection Outcome: Not Progressing Goal: Diagnostic test results will improve Outcome: Not Progressing Goal: Respiratory complications will improve Outcome: Not Progressing Goal: Cardiovascular complication will be avoided Outcome: Not Progressing   Problem: Activity: Goal: Risk for activity intolerance will decrease Outcome: Not Progressing   Problem: Nutrition: Goal: Adequate nutrition will be maintained Outcome: Not Progressing   Problem: Coping: Goal: Level of anxiety will decrease Outcome: Not Progressing   Problem: Elimination: Goal: Will not experience complications related to bowel motility Outcome: Not Progressing Goal: Will not experience complications related to urinary retention Outcome: Not Progressing   Problem: Pain Managment: Goal: General experience of comfort will improve and/or be controlled Outcome: Not Progressing   Problem: Safety: Goal: Ability to remain free from injury will improve Outcome: Not Progressing   Problem: Skin Integrity: Goal: Risk for impaired skin  integrity will decrease Outcome: Not Progressing   Problem: Education: Goal: Ability to describe self-care measures that may prevent or decrease complications (Diabetes Survival Skills Education) will improve Outcome: Not Progressing Goal: Individualized Educational Video(s) Outcome: Not Progressing   Problem: Coping: Goal: Ability to adjust to condition or change in health will improve Outcome: Not Progressing   Problem: Fluid Volume: Goal: Ability to maintain a balanced intake and output will improve Outcome: Not Progressing   Problem: Health Behavior/Discharge Planning: Goal: Ability to identify and utilize available resources and services will improve Outcome: Not Progressing Goal: Ability to manage health-related needs will improve Outcome: Not Progressing   Problem: Metabolic: Goal: Ability to maintain appropriate glucose levels will improve Outcome: Not Progressing   Problem: Nutritional: Goal: Maintenance of adequate nutrition will improve Outcome: Not Progressing Goal: Progress toward achieving an optimal weight will improve Outcome: Not Progressing   Problem: Skin Integrity: Goal: Risk for impaired skin integrity will decrease Outcome: Not Progressing   Problem: Tissue Perfusion: Goal: Adequacy of tissue perfusion will improve Outcome: Not Progressing   Problem: Activity: Goal: Ability to tolerate increased activity will improve Outcome: Not Progressing   Problem: Respiratory: Goal: Ability to maintain a clear airway and adequate ventilation will improve Outcome: Not Progressing   Problem: Role Relationship: Goal: Method of communication will improve Outcome: Not Progressing

## 2023-10-07 NOTE — Progress Notes (Signed)
 PHARMACY CONSULT NOTE  Pharmacy Consult for Electrolyte Monitoring and Replacement   Recent Labs: Potassium (mmol/L)  Date Value  10/07/2023 4.0  12/31/2014 4.0   Magnesium  (mg/dL)  Date Value  08/65/7846 2.0   Calcium  (mg/dL)  Date Value  96/29/5284 7.3 (L)   Albumin  (g/dL)  Date Value  13/24/4010 2.9 (L)   Phosphorus (mg/dL)  Date Value  27/25/3664 2.9   Sodium (mmol/L)  Date Value  10/07/2023 135   Assessment: 73 y/o female presenting with altered mental status and hypoxia. PMH significant for bipolar disorder, breast cancer, anxiety, depression, hypothyroidism. Pharmacy is asked to follow and replace electrolytes while in CCU  Goal of Therapy:  Electrolytes WNL  Plan:  --No electrolyte replacement indicated at this time --Labs tomorrow  [name] 10/07/2023 7:57 AM

## 2023-10-07 NOTE — Plan of Care (Signed)
  Problem: Nutrition: Goal: Adequate nutrition will be maintained Outcome: Progressing   Problem: Education: Goal: Knowledge of General Education information will improve Description: Including pain rating scale, medication(s)/side effects and non-pharmacologic comfort measures Outcome: Not Progressing   Problem: Health Behavior/Discharge Planning: Goal: Ability to manage health-related needs will improve Outcome: Not Progressing   Problem: Clinical Measurements: Goal: Ability to maintain clinical measurements within normal limits will improve Outcome: Not Progressing Goal: Respiratory complications will improve Outcome: Not Progressing   Problem: Activity: Goal: Risk for activity intolerance will decrease Outcome: Not Progressing   Problem: Coping: Goal: Level of anxiety will decrease Outcome: Not Progressing   Problem: Elimination: Goal: Will not experience complications related to bowel motility Outcome: Not Progressing Goal: Will not experience complications related to urinary retention Outcome: Not Progressing   Problem: Pain Managment: Goal: General experience of comfort will improve and/or be controlled Outcome: Not Progressing   Problem: Skin Integrity: Goal: Risk for impaired skin integrity will decrease Outcome: Not Progressing   Problem: Coping: Goal: Ability to adjust to condition or change in health will improve Outcome: Not Progressing   Problem: Health Behavior/Discharge Planning: Goal: Ability to identify and utilize available resources and services will improve Outcome: Not Progressing Goal: Ability to manage health-related needs will improve Outcome: Not Progressing   Problem: Skin Integrity: Goal: Risk for impaired skin integrity will decrease Outcome: Not Progressing

## 2023-10-08 ENCOUNTER — Inpatient Hospital Stay

## 2023-10-08 DIAGNOSIS — J189 Pneumonia, unspecified organism: Secondary | ICD-10-CM | POA: Diagnosis not present

## 2023-10-08 DIAGNOSIS — R531 Weakness: Secondary | ICD-10-CM | POA: Diagnosis not present

## 2023-10-08 DIAGNOSIS — A4101 Sepsis due to Methicillin susceptible Staphylococcus aureus: Secondary | ICD-10-CM | POA: Diagnosis not present

## 2023-10-08 DIAGNOSIS — R918 Other nonspecific abnormal finding of lung field: Secondary | ICD-10-CM | POA: Diagnosis not present

## 2023-10-08 DIAGNOSIS — Z515 Encounter for palliative care: Secondary | ICD-10-CM | POA: Diagnosis not present

## 2023-10-08 LAB — RENAL FUNCTION PANEL
Albumin: 2.5 g/dL — ABNORMAL LOW (ref 3.5–5.0)
Anion gap: 3 — ABNORMAL LOW (ref 5–15)
BUN: 15 mg/dL (ref 8–23)
CO2: 24 mmol/L (ref 22–32)
Calcium: 7.2 mg/dL — ABNORMAL LOW (ref 8.9–10.3)
Chloride: 108 mmol/L (ref 98–111)
Creatinine, Ser: <0.3 mg/dL — ABNORMAL LOW (ref 0.44–1.00)
Glucose, Bld: 170 mg/dL — ABNORMAL HIGH (ref 70–99)
Phosphorus: 2.2 mg/dL — ABNORMAL LOW (ref 2.5–4.6)
Potassium: 4.5 mmol/L (ref 3.5–5.1)
Sodium: 135 mmol/L (ref 135–145)

## 2023-10-08 LAB — CBC
HCT: 24.4 % — ABNORMAL LOW (ref 36.0–46.0)
Hemoglobin: 7.9 g/dL — ABNORMAL LOW (ref 12.0–15.0)
MCH: 31.7 pg (ref 26.0–34.0)
MCHC: 32.4 g/dL (ref 30.0–36.0)
MCV: 98 fL (ref 80.0–100.0)
Platelets: 287 10*3/uL (ref 150–400)
RBC: 2.49 MIL/uL — ABNORMAL LOW (ref 3.87–5.11)
RDW: 17 % — ABNORMAL HIGH (ref 11.5–15.5)
WBC: 13.2 10*3/uL — ABNORMAL HIGH (ref 4.0–10.5)
nRBC: 0.2 % (ref 0.0–0.2)

## 2023-10-08 LAB — GLUCOSE, CAPILLARY
Glucose-Capillary: 130 mg/dL — ABNORMAL HIGH (ref 70–99)
Glucose-Capillary: 136 mg/dL — ABNORMAL HIGH (ref 70–99)
Glucose-Capillary: 141 mg/dL — ABNORMAL HIGH (ref 70–99)
Glucose-Capillary: 148 mg/dL — ABNORMAL HIGH (ref 70–99)
Glucose-Capillary: 152 mg/dL — ABNORMAL HIGH (ref 70–99)
Glucose-Capillary: 155 mg/dL — ABNORMAL HIGH (ref 70–99)
Glucose-Capillary: 155 mg/dL — ABNORMAL HIGH (ref 70–99)

## 2023-10-08 LAB — CULTURE, BLOOD (ROUTINE X 2)
Culture: NO GROWTH
Culture: NO GROWTH
Special Requests: ADEQUATE

## 2023-10-08 LAB — HEPARIN LEVEL (UNFRACTIONATED): Heparin Unfractionated: 0.53 [IU]/mL (ref 0.30–0.70)

## 2023-10-08 LAB — MAGNESIUM: Magnesium: 2 mg/dL (ref 1.7–2.4)

## 2023-10-08 LAB — TRIGLYCERIDES: Triglycerides: 97 mg/dL (ref <150)

## 2023-10-08 MED ORDER — K PHOS MONO-SOD PHOS DI & MONO 155-852-130 MG PO TABS
500.0000 mg | ORAL_TABLET | ORAL | Status: AC
  Administered 2023-10-08 (×3): 500 mg
  Filled 2023-10-08 (×3): qty 2

## 2023-10-08 NOTE — IPAL (Signed)
  Interdisciplinary Goals of Care Family Meeting   Date carried out: 10/08/2023  Location of the meeting: Bedside  Member's involved: Nurse Practitioner and Social Worker  Durable Power of Attorney or acting medical decision maker: Newly assigned HCPOA from [place] [facility] Selena Pittman  (phone # [phone])  Discussion: We discussed goals of care for Selena Pittman .  We reviewed clinical course including Acute Metabolic Encephalopathy, Acute Hypoxic Respiratory Failure, Severe Sepsis with septic shock due to Rhinovirus/Parainfluenza viral pneumonia with superimposed MSSA Pneumonia along with E. Coli UTI, along with extensive bilateral DVT's and PE requiring intubation and mechanical ventilation.  Was briefly extubated but required reintubation the following morning due to inability to handle secretions and inability to protect airway. Now appears to be declining further with worsening septic shock and poor ventilator compliance.  Attempted to elicit goals of care. We discussed code status, Full Code versus Do Not Resuscitate, Encouraged patient to consider DNR/DNI status understanding evidenced based poor outcomes in similar hospitalized patients, as the cause of the arrest is likely associated with chronic/terminal disease rather than a reversible acute cardio-pulmonary event.  Discussed that per talks with the pt's son 2 days ago, he reported that over the last 9 months the patient has significantly declined and has refused to do things and refused to even get out of bed with very poor quality of life.  Her son had voiced that he felt the patient would NOT WANT to be supported long term with machines, and he felt she would NOT WANT to have a tracheostomy or feeding tubes for survival. However he is having difficultly with making these decisions for his mother, and thus transferred his HCPOA rights over to DSS.  Discussed with Selena that the ICU team's current recommendations would include: 1)  making the patient a DNR, 2) continuing to treat the treatable/infections/etc. and try to optimize for 1 way extubation at some point in the near future,  3) but would advise against long ventilation/Tracheostomy and feeding tubes as her long term prognosis is very poor given severe deconditioning and multiple chronic co morbidities   Selena verbalized she will discuss the case and ICU team's recommendations with her supervisor,  and will get back to us  on those decisions.  She also requests that we continue to update her daily.    Code status:   Code Status: Full Code   Disposition: Continue current acute care  Time spent for the meeting: 10 minutes   Cherylann Corpus, AGACNP-BC [facility] Prefer epic messenger for cross cover needs If after hours, please call E-link  Delanna Fears, NP  10/08/2023, 10:45 AM

## 2023-10-08 NOTE — TOC Progression Note (Signed)
 Transition of Care [facility]) - Progression Note    Patient Details  Name: Selena Pittman MRN: [id number] Date of Birth: 1950/11/11  Transition of [facility]) CM/SW Contact  Phoenix Dresser A Leverne Tessler, RN Phone Number: 10/08/2023, 11:55 AM  Clinical Narrative:    Chart reviewed.  I have spoken with Zyannah Haizlip.  She informs me that she is the DSS Social Worker that has been assigned to [name].  Her contact number is [phone].  I have placed guardianship paperwork on the chart.  Patient remains intubated and remains critically ill.    TOC will continue to follow for discharge planning.     Expected Discharge Plan: Skilled Nursing Facility Barriers to Discharge: Continued Medical Work up  Expected Discharge Plan and Services       Living arrangements for the past 2 months: Skilled Nursing Facility                                       Social Determinants of Health (SDOH) Interventions SDOH Screenings   Food Insecurity: No Food Insecurity (10/29/2022)  Housing: Low Risk  (10/29/2022)  Transportation Needs: No Transportation Needs (10/29/2022)  Utilities: Not At Risk (10/29/2022)  Social Connections: Patient Unable To Answer (09/29/2023)  Tobacco Use: High Risk (09/28/2023)    Readmission Risk Interventions    09/30/2023   10:38 AM 07/30/2023   12:52 PM  Readmission Risk Prevention Plan  Transportation Screening Complete Complete  PCP or Specialist Appt within 3-5 Days Complete   HRI or Home Care Consult  Complete  Social Work Consult for Recovery Care Planning/Counseling Complete   Palliative Care Screening Not Applicable   Medication Review Oceanographer) Complete Complete

## 2023-10-08 NOTE — Progress Notes (Signed)
 PHARMACY CONSULT NOTE  Pharmacy Consult for Electrolyte Monitoring and Replacement   Recent Labs: Potassium (mmol/L)  Date Value  10/08/2023 4.5  12/31/2014 4.0   Magnesium  (mg/dL)  Date Value  60/45/4098 2.0   Calcium  (mg/dL)  Date Value  11/91/4782 7.2 (L)   Albumin  (g/dL)  Date Value  95/62/1308 2.5 (L)   Phosphorus (mg/dL)  Date Value  65/78/4696 2.2 (L)   Sodium (mmol/L)  Date Value  10/08/2023 135   Assessment: 73 y/o female presenting with altered mental status and hypoxia. PMH significant for bipolar disorder, breast cancer, anxiety, depression, hypothyroidism. Pharmacy is asked to follow and replace electrolytes while in CCU  Goal of Therapy:  Electrolytes WNL  Plan:  --Phos 2.2, K Phos  Neutral 500 mg per tube q4h x 3 doses per PCCM --Labs tomorrow  [name] 10/08/2023 7:45 AM

## 2023-10-08 NOTE — Progress Notes (Signed)
 NAME:  Selena Pittman, MRN:  [id number], DOB:  11/19/1950, LOS: 10 ADMISSION DATE:  09/28/2023   History of Present Illness:  s. Selena Pittman is a 73 year old female with history of hypertension, on midodrine , GERD, insomnia, left breast cancer, anxiety, depression, hypothyroid, bipolar, who presents ED for chief concerns of hypotension and hypoxia at [facility] house.   Vitals in the ED showed t 98.3, respiration rate of 18, heart rate 112, blood pressure 83/64, improved to 99/67, SpO2 of 92% on 2 L nasal cannula.   Serum sodium is 139, potassium 3.0, chloride one 0.6, bicarb 23, BUN of 7, serum creatinine 0.35, EGFR greater than 60, nonfasting blood glucose 99, WBC 11.6, hemoglobin 12.2, platelets of 474.HS troponin is 38, on repeat is 49. COVID/influenza A/influenza B/RSV PCR were negative.Lactic acid is 1.3. Chest x-ray with concern of atypical pneumonia. CTA chest with small bilateral PE and bilateral infiltrate concerning for pneumonia CT head with no acute abnormality, age-related atrophy and moderate paranasal sinus disease.  CT abdomen and pelvis with concern of enterocolitis and fecal impaction in rectum.   ED treatment: Heparin  bolus and gtt., vancomycin , cefepime , sodium chloride  1.5 liter bolus, midodrine  2.5 mg p.o. one-time dose.  Pertinent  Medical History  As above  Significant Hospital Events: Including procedures, antibiotic start and stop dates in addition to other pertinent events   6/11: Vital stable, preliminary blood cultures negative.  MRI brain with no acute intracranial abnormality.  Mucosal thickening with air-fluid level in left sphenoid and maxillary sinuses more consistent with acute sinusitis. MRSA PCR positive so adding vancomycin . Lower extremity venous Doppler with extensive bilateral DVT, right greater than left.  Echocardiogram was normal. Vascular surgery was consulted to see if she is a candidate for any intervention or IVC filter placement.    6/12: Patient was little more lethargic this morning so ABG was done, it shows pH of 7.51, CO2 30 and mild hypoxia with pO2 of 65.  Placed on 2 L of oxygen.  Later she did woke up.  Procalcitonin negative so checking respiratory viral panel as he might be having viral pneumonia.  Urine cultures with ESBL E. coli-ordered 1 dose of fosfomycin. based on her debility-vascular evaluated her and she will not be a candidate for any intervention.  Heparin  is being switched with Xarelto . Chest PT ordered as she remained quite congested Mild hypokalemia and hypomagnesemia-replating electrolytes 6/13: Patient remained quite congested.  Respiratory viral panel positive for rhino and parainfluenza virus.  Mild hypophosphatemia today which is being repleted. 06/14:  Patient admitted to the ICU, intubated and bronched. Sputum sent for cultutre. 10/03/23: WBC trending up. ESBL in urine culture. Severe hypokalemia. Persistent shock.  10/04/23- patient remains on MV, hyperglycemic this am. Has increased resp secretions which are inspissated.  She remains on levophed  and vasopressin infusion.  She is heparin  infusion.  She is at risk of re-feeding syndrome due to moderate protein calorie malnutrition.  She had multiple bowel movements overnight.  She is currently being treated for multi drug resistant UTI with viral LRTI due to parainfulenza and rhinovirus.   Palliative care is following.  10/05/23- ESBL, MRSA, parainfluenza 3 and rhinoevirus positive, pharmcy consultant recommendation for zyvox  initiation, CVP monitoring, sedation req is high we are changing to ketamine from prop due to shock physiology.  She has sacral decubitus stage 3 present on admission.  10/06/23- Patient had no overnight events, continuing wake up asessment, reduced vasopressors by 80% plan to extubate when able. Wound care following. CVP 6  this am.  10/07/23- patient deteriorated overnight with requirement of re-intubation after discussion with her son.   She at Rass negative 1.  Prognosis is poor, palliative care is following. Continuing to treat MSSA CAP and parainfluenza infection.  She also had ecoli UTI and has completed therapy for this.  10/08/23- daughter and granddaughter came in to see patient.  State appointed POA is working with us  on goals of care.   Objective    Blood pressure 100/79, pulse (!) 108, temperature (!) 97.5 F (36.4 C), resp. rate (!) 28, height 5' 5 (1.651 m), weight 60.4 kg, SpO2 100%.    Vent Mode: PRVC FiO2 (%):  [40 %-60 %] 40 % Set Rate:  [20 bmp] 20 bmp Vt Set:  [350 mL] 350 mL PEEP:  [5 cmH20] 5 cmH20 Plateau Pressure:  [14 cmH20-18 cmH20] 16 cmH20   Intake/Output Summary (Last 24 hours) at 10/08/2023 1013 Last data filed at 10/08/2023 0901 Gross per 24 hour  Intake 4068.63 ml  Output 984 ml  Net 3084.63 ml   Filed Weights   10/05/23 0432 10/06/23 0712 10/07/23 0500  Weight: 55.3 kg 54.6 kg 60.4 kg    Examination: General: Chronically ill, cachectic , intubated and sedated.  HENT: Supple neck, reactive pupils  Lungs: Diffuse ronchi appreciated Cardiovascular: Normal S1, Normal S2, RRR  Abdomen: Soft, non tender, non distended, + BS  Extremities: Warm and well perfused   Labs and imaging were reviewed.    Assessment and Plan  Case of a 73 years old female patient with a past medical history of failure to thrive, hypertension, GERD, insomnia, remote history of breast cancer who is presenting from [facility] nursing [facility] to [facility] with acute hypoxic respiratory failure on 09/28/2023.  She was found to have rhinovirus and parainfluenza virus positive.  Course complicated by worsening acute hypoxic respiratory failure requiring intubation mechanical ventilation on 10/02/2023.  Status post bronchoscopy with extensive secretions bilaterally.  1 Acute hypoxic respiratory failure requiring intubation and mechanical ventilation on 10/02/2023 secondary to Rhinovirus and parainfluenza pneumonia with possible  superimposed bacterial pneumonia MSSA and UTI elcoli.  MRSA swab positive. 2 Mucous plugging with extensive secretions secondary to the above  3. Shock-septic present on admission due to UTI with ecoli           UTI with ESBL E.Coli 06/10 , MSSA pneumonia and viral LRTI with rhinovirus/parainfluena 4 Extensive bilateral DVTs and PE (secondary to DVT due to immobilization and current inflammatory process) PE of low risk ft  5  Severe protein calorie malnutrition - refeeding syndrome -monitoring and correcting electrolytes 6.  Sacral decubitus stge 3 -present on admission - patient is bedbound at baseline, wound care consultation  # Bedbound at baseline # Hypertension, hyperlipidemia, insomnia  Neuro: Propofol  for RASS 0 to -1.  Fentanyl  for CPOT less than 2. CVS: Nor epi for MAP greater than 65. Add vasopressin and stress dose steroids. Heparin  drip.  Pulmonary: heavy phlegm with MV sounds GI: Start trickle feeds. Aaron Aas PPI for prohylaxis,  Renal: Avoid nephrotoxic agents. Monitor UOP.  Heme: Heparin  drip for PE/DVT.  Endo: POC goal 140-180.    Best Practice (right click and Reselect all SmartList Selections daily)   Diet/type: Trickle feed DVT prophylaxis systemic heparin  Pressure ulcer(s): N/A GI prophylaxis: PPI Lines: Left internal jugular CVC, Left radial A-line Foley:  Yes 06/14 Code Status:  full code  Critical care provider statement:   Total critical care time: 32 minutes   Performed by: Jaclynn Mast MD  Critical care time was exclusive of separately billable procedures and treating other patients.   Critical care was necessary to treat or prevent imminent or life-threatening deterioration.   Critical care was time spent personally by me on the following activities: development of treatment plan with patient and/or surrogate as well as nursing, discussions with consultants, evaluation of patient's response to treatment, examination of patient, obtaining history from  patient or surrogate, ordering and performing treatments and interventions, ordering and review of laboratory studies, ordering and review of radiographic studies, pulse oximetry and re-evaluation of patient's condition.    Wilmon Conover, M.D.  Pulmonary & Critical Care Medicine

## 2023-10-08 NOTE — Progress Notes (Signed)
 PHARMACY - ANTICOAGULATION CONSULT NOTE  Pharmacy Consult for IV Heparin  Indication: pulmonary embolus  Patient Measurements: Height: 5' 5 (165.1 cm) Weight: 60.4 kg (133 lb 2.5 oz) IBW/kg (Calculated) : 57 HEPARIN  DW (KG): 45.1  Labs: Recent Labs    10/06/23 0643 10/06/23 1052 10/06/23 1052 10/07/23 0540 10/07/23 1952 10/08/23 0449  HGB  --  7.4*   < > 7.8*  --  7.9*  HCT  --  21.9*  --  23.4*  --  24.4*  PLT  --  237  --  229  --  287  HEPARINUNFRC 0.40  --   --  0.37  --  0.53  CREATININE <0.30*  --   --  <0.30* <0.30*  --    < > = values in this interval not displayed.    CrCl cannot be calculated (This lab value cannot be used to calculate CrCl because it is not a number: <0.30).   Medical History: Past Medical History:  Diagnosis Date   Bipolar affective (HCC)    Breast cancer (HCC)    Cancer (HCC) 12/11/2014   INVASIVE MAMMARY CARCINOMA/.left breast/ T2 N0   Cardiomegaly    Depression    Emphysema of lung (HCC)    Endometriosis    Hyperlipidemia    Osteoporosis    Schizoaffective disorder (HCC)    Schizophrenia (HCC)    Severe sepsis (HCC) 04/02/2022   Thyroid  nodule    Vitamin D  deficiency    Assessment: 73 y/o female presenting with altered mental status and hypoxia. PMH significant for bipolar disorder, breast cancer, anxiety, depression, hypothyroidism. CT imaging shows pulmonary embolism.Per chart review, patient is not on anticoagulation prior to admission. Pharmacy has been consulted to re- initiate and manage heparin  infusion.  Patient started Xarelto  (last dose 06/14 0825). Now transitioned back to heparin  drip.  Goal of Therapy:  Heparin  level 0.3-0.7 units/ml aPTT 66 - 102s Monitor platelets by anticoagulation protocol: Yes  06/16 0846 HL 0.13, aPTT 67s, subthera; 650 un/hr 06/16 2023 HL 0.24, subthera; 750 un/hr 06/17 0429 HL 0.31, therapeutic x 1 06/17 1244 HL 0.28, subthera; 850 un/hr 06/17 2252 HL 0.34, therapeutic x 1 06/18 0643  HL 0.40, therapeutic x 2 06/19 0540 HL 0.37, therapeutic X 3  06/20 0449 HL 0.53, therapeutic X 4   Plan:  --Heparin  level is therapeutic x 4 --Continue heparin  infusion at 950 units/hr --Re-check HL & CBC tomorrow AM  Thank you for involving pharmacy in this patient's care.   Tamitha Norell D 10/08/2023 5:22 AM

## 2023-10-08 NOTE — Plan of Care (Signed)
 The patient continues to be on the ventilator. Problem: Education: Goal: Knowledge of General Education information will improve Description: Including pain rating scale, medication(s)/side effects and non-pharmacologic comfort measures 10/08/2023 1836 by Jeneen Mire, Billie Budge, RN Outcome: Not Progressing 10/08/2023 1835 by Jeneen Mire, Billie Budge, RN Outcome: Progressing   Problem: Health Behavior/Discharge Planning: Goal: Ability to manage health-related needs will improve 10/08/2023 1836 by Nereida Banning, RN Outcome: Not Progressing 10/08/2023 1835 by Jeneen Mire, Billie Budge, RN Outcome: Progressing   Problem: Clinical Measurements: Goal: Ability to maintain clinical measurements within normal limits will improve 10/08/2023 1836 by Nereida Banning, RN Outcome: Not Progressing 10/08/2023 1835 by Jeneen Mire, Billie Budge, RN Outcome: Progressing Goal: Will remain free from infection 10/08/2023 1836 by Nereida Banning, RN Outcome: Not Progressing 10/08/2023 1835 by Jeneen Mire, Billie Budge, RN Outcome: Progressing Goal: Diagnostic test results will improve 10/08/2023 1836 by Nereida Banning, RN Outcome: Not Progressing 10/08/2023 1835 by Jeneen Mire, Billie Budge, RN Outcome: Progressing Goal: Respiratory complications will improve 10/08/2023 1836 by Nereida Banning, RN Outcome: Not Progressing 10/08/2023 1835 by Jeneen Mire, Billie Budge, RN Outcome: Progressing Goal: Cardiovascular complication will be avoided 10/08/2023 1836 by Nereida Banning, RN Outcome: Not Progressing 10/08/2023 1835 by Jeneen Mire, Billie Budge, RN Outcome: Progressing   Problem: Activity: Goal: Risk for activity intolerance will decrease 10/08/2023 1836 by Jeneen Mire, Billie Budge, RN Outcome: Not Progressing 10/08/2023 1835 by Jeneen Mire, Billie Budge, RN Outcome: Progressing   Problem: Nutrition: Goal: Adequate nutrition will be maintained 10/08/2023 1836 by  Nereida Banning, RN Outcome: Not Progressing 10/08/2023 1835 by Jeneen Mire, Billie Budge, RN Outcome: Progressing   Problem: Coping: Goal: Level of anxiety will decrease 10/08/2023 1836 by Nereida Banning, RN Outcome: Not Progressing 10/08/2023 1835 by Jeneen Mire, Billie Budge, RN Outcome: Progressing   Problem: Elimination: Goal: Will not experience complications related to bowel motility 10/08/2023 1836 by Nereida Banning, RN Outcome: Not Progressing 10/08/2023 1835 by Jeneen Mire, Billie Budge, RN Outcome: Progressing Goal: Will not experience complications related to urinary retention 10/08/2023 1836 by Nereida Banning, RN Outcome: Not Progressing 10/08/2023 1835 by Jeneen Mire, Billie Budge, RN Outcome: Progressing   Problem: Pain Managment: Goal: General experience of comfort will improve and/or be controlled 10/08/2023 1836 by Nereida Banning, RN Outcome: Not Progressing 10/08/2023 1835 by Jeneen Mire, Billie Budge, RN Outcome: Progressing   Problem: Safety: Goal: Ability to remain free from injury will improve 10/08/2023 1836 by Nereida Banning, RN Outcome: Not Progressing 10/08/2023 1835 by Jeneen Mire, Billie Budge, RN Outcome: Progressing   Problem: Skin Integrity: Goal: Risk for impaired skin integrity will decrease 10/08/2023 1836 by Nereida Banning, RN Outcome: Not Progressing 10/08/2023 1835 by Jeneen Mire, Billie Budge, RN Outcome: Progressing   Problem: Education: Goal: Ability to describe self-care measures that may prevent or decrease complications (Diabetes Survival Skills Education) will improve 10/08/2023 1836 by Nereida Banning, RN Outcome: Not Progressing 10/08/2023 1835 by Jeneen Mire, Billie Budge, RN Outcome: Progressing Goal: Individualized Educational Video(s) 10/08/2023 1836 by Jeneen Mire, Billie Budge, RN Outcome: Not Progressing 10/08/2023 1835 by Jeneen Mire, Billie Budge, RN Outcome: Progressing    Problem: Coping: Goal: Ability to adjust to condition or change in health will improve 10/08/2023 1836 by Nereida Banning, RN Outcome: Not Progressing 10/08/2023 1835 by Jeneen Mire, Billie Budge, RN Outcome: Progressing   Problem: Fluid Volume: Goal: Ability to maintain a balanced intake and output will improve 10/08/2023 1836 by Nereida Banning, RN Outcome: Not Progressing 10/08/2023 1835 by Jeneen Mire, Billie Budge, RN Outcome: Progressing   Problem:  Health Behavior/Discharge Planning: Goal: Ability to identify and utilize available resources and services will improve 10/08/2023 1836 by Jeneen Mire, Billie Budge, RN Outcome: Not Progressing 10/08/2023 1835 by Jeneen Mire, Billie Budge, RN Outcome: Progressing Goal: Ability to manage health-related needs will improve 10/08/2023 1836 by Nereida Banning, RN Outcome: Not Progressing 10/08/2023 1835 by Jeneen Mire, Billie Budge, RN Outcome: Progressing   Problem: Metabolic: Goal: Ability to maintain appropriate glucose levels will improve 10/08/2023 1836 by Nereida Banning, RN Outcome: Not Progressing 10/08/2023 1835 by Jeneen Mire, Billie Budge, RN Outcome: Progressing   Problem: Nutritional: Goal: Maintenance of adequate nutrition will improve 10/08/2023 1836 by Nereida Banning, RN Outcome: Not Progressing 10/08/2023 1835 by Jeneen Mire, Billie Budge, RN Outcome: Progressing Goal: Progress toward achieving an optimal weight will improve 10/08/2023 1836 by Nereida Banning, RN Outcome: Not Progressing 10/08/2023 1835 by Jeneen Mire, Billie Budge, RN Outcome: Progressing   Problem: Skin Integrity: Goal: Risk for impaired skin integrity will decrease 10/08/2023 1836 by Nereida Banning, RN Outcome: Not Progressing 10/08/2023 1835 by Jeneen Mire, Billie Budge, RN Outcome: Progressing   Problem: Tissue Perfusion: Goal: Adequacy of tissue perfusion will improve 10/08/2023 1836 by Nereida Banning, RN Outcome: Not Progressing 10/08/2023 1835 by Jeneen Mire, Billie Budge, RN Outcome: Progressing   Problem: Activity: Goal: Ability to tolerate increased activity will improve 10/08/2023 1836 by Jeneen Mire, Billie Budge, RN Outcome: Not Progressing 10/08/2023 1835 by Jeneen Mire, Billie Budge, RN Outcome: Progressing   Problem: Respiratory: Goal: Ability to maintain a clear airway and adequate ventilation will improve 10/08/2023 1836 by Nereida Banning, RN Outcome: Not Progressing 10/08/2023 1835 by Jeneen Mire, Billie Budge, RN Outcome: Progressing   Problem: Role Relationship: Goal: Method of communication will improve 10/08/2023 1836 by Nereida Banning, RN Outcome: Not Progressing 10/08/2023 1835 by Jeneen Mire, Billie Budge, RN Outcome: Progressing

## 2023-10-08 NOTE — Progress Notes (Signed)
                                                     Palliative Care Progress Note, Assessment & Plan   Patient Name: Selena Pittman       Date: 10/08/2023 DOB: [birth year]  Age: 73 y.o. MRN#: [id number] Attending Physician: Erskin Hearing, MD Primary Care Physician: Sharyne Degree, FNP Admit Date: 09/28/2023  Subjective: Patient lying in bed, intubated.  No family or friends present during my visit.  HPI: 73 y.o. female  with past medical history significant for hypotension on midodrine , HLD, left breast cancer, bipolar, anxiety, depression, schizophrenia, schizoaffective disorder, and previous suicide attempt.  She presented to ED via EMS 09/28/2023 from [facility] healthcare complaining of lethargy, hypotension and hypoxia.  EMS reported patient had temp of 99.6 axillary and worsening cough. ED workup showed Na+ 139, K+ 3.0, bicarb 23, BUN 7, creatinine 0.35, GFR > 60, WBC 11.6, Hgb 12.2, platelets 474, troponin 38 => 49.  COVID/flu A-B/RSV negative.  Lactic 1.3. BP 99/67, HR 112, RR 18, SpO2 92% on 2 L O2 and 98.3.   Patient was admitted to [facility] for management of severe sepsis 2/2 PNA, pyuria, multilobar lung infiltrate, acute PE and severe protein calorie malnutrition.   PMT was consulted for assistance with goals of care discussion.   Patient status continued to worsen leading to rapid response being called 10/02/2023 requiring emergent intubation and transfer to ICU.  PMT was unable to complete consult due to this event.   Dr. Lucina Sabal had long discussion with family at the bedside including CODE STATUS and prognosis 10/02/2023.  Selena Pittman (son) at that time reports he was not comfortable making patient a DNR and wanted to exhaust all options.   6/18 - extubated 6/19 - reintubated overnight   Summary of counseling/coordination of care: Extensive  chart review completed prior to meeting patient including labs, vital signs, imaging, progress notes, orders, and available advanced directive documents from current and previous encounters.   After reviewing the patient's chart and assessing the patient at bedside, I spoke with CCM NP Selena Pittman in regards to plan of care.  Patient now has DSS guardian who is bedside to evaluate with CCM yesterday.  Medical team's recommendations remain for DNR and DNI with eventual one-way extubation if treating the treatable fails.  Awaiting response from DSS guardian as far as any changes to plan of care.  PMT remains available patient and family throughout her hospitalization.  Physical Exam Vitals reviewed.  Constitutional:      Comments: Thin, frail   Cardiovascular:     Rate and Rhythm: Normal rate.     Pulses: Normal pulses.  Pulmonary:     Comments: intubated Abdominal:     Palpations: Abdomen is soft.   Skin:    General: Skin is warm and dry.     Coloration: Skin is pale.             Total Time 25 minutes   Time spent includes: Detailed review of medical records (labs, imaging, vital signs), medically appropriate exam (mental status, respiratory, cardiac, skin), discussed with treatment team, counseling and educating patient, family and staff, documenting clinical information, medication management and coordination of care.  Judeen Nose L. Rebbeca Campi, DNP, FNP-BC Palliative Medicine Team

## 2023-10-09 DIAGNOSIS — Z711 Person with feared health complaint in whom no diagnosis is made: Secondary | ICD-10-CM | POA: Diagnosis not present

## 2023-10-09 DIAGNOSIS — Z789 Other specified health status: Secondary | ICD-10-CM | POA: Diagnosis not present

## 2023-10-09 DIAGNOSIS — Z515 Encounter for palliative care: Secondary | ICD-10-CM | POA: Diagnosis not present

## 2023-10-09 DIAGNOSIS — R531 Weakness: Secondary | ICD-10-CM | POA: Diagnosis not present

## 2023-10-09 DIAGNOSIS — A4101 Sepsis due to Methicillin susceptible Staphylococcus aureus: Secondary | ICD-10-CM | POA: Diagnosis not present

## 2023-10-09 LAB — CBC
HCT: 25.2 % — ABNORMAL LOW (ref 36.0–46.0)
Hemoglobin: 8 g/dL — ABNORMAL LOW (ref 12.0–15.0)
MCH: 31.3 pg (ref 26.0–34.0)
MCHC: 31.7 g/dL (ref 30.0–36.0)
MCV: 98.4 fL (ref 80.0–100.0)
Platelets: 322 10*3/uL (ref 150–400)
RBC: 2.56 MIL/uL — ABNORMAL LOW (ref 3.87–5.11)
RDW: 17.1 % — ABNORMAL HIGH (ref 11.5–15.5)
WBC: 16.8 10*3/uL — ABNORMAL HIGH (ref 4.0–10.5)
nRBC: 0.1 % (ref 0.0–0.2)

## 2023-10-09 LAB — RENAL FUNCTION PANEL
Albumin: 2.4 g/dL — ABNORMAL LOW (ref 3.5–5.0)
Anion gap: 3 — ABNORMAL LOW (ref 5–15)
BUN: 16 mg/dL (ref 8–23)
CO2: 27 mmol/L (ref 22–32)
Calcium: 7.6 mg/dL — ABNORMAL LOW (ref 8.9–10.3)
Chloride: 107 mmol/L (ref 98–111)
Creatinine, Ser: <0.3 mg/dL — ABNORMAL LOW (ref 0.44–1.00)
Glucose, Bld: 150 mg/dL — ABNORMAL HIGH (ref 70–99)
Phosphorus: 2.4 mg/dL — ABNORMAL LOW (ref 2.5–4.6)
Potassium: 4.4 mmol/L (ref 3.5–5.1)
Sodium: 137 mmol/L (ref 135–145)

## 2023-10-09 LAB — GLUCOSE, CAPILLARY
Glucose-Capillary: 146 mg/dL — ABNORMAL HIGH (ref 70–99)
Glucose-Capillary: 194 mg/dL — ABNORMAL HIGH (ref 70–99)
Glucose-Capillary: 151 mg/dL — ABNORMAL HIGH (ref 70–99)
Glucose-Capillary: 144 mg/dL — ABNORMAL HIGH (ref 70–99)
Glucose-Capillary: 125 mg/dL — ABNORMAL HIGH (ref 70–99)

## 2023-10-09 LAB — HEPARIN LEVEL (UNFRACTIONATED): Heparin Unfractionated: 0.62 [IU]/mL (ref 0.30–0.70)

## 2023-10-09 MED ORDER — K PHOS MONO-SOD PHOS DI & MONO 155-852-130 MG PO TABS
500.0000 mg | ORAL_TABLET | ORAL | Status: DC
  Filled 2023-10-09 (×2): qty 2

## 2023-10-09 MED ORDER — POTASSIUM & SODIUM PHOSPHATES 280-160-250 MG PO PACK
1.0000 | PACK | Freq: Three times a day (TID) | ORAL | Status: AC
  Administered 2023-10-09 (×3): 1
  Filled 2023-10-09 (×3): qty 1

## 2023-10-09 MED ORDER — ZINC OXIDE 40 % EX OINT
TOPICAL_OINTMENT | CUTANEOUS | Status: DC | PRN
  Filled 2023-10-09: qty 113

## 2023-10-09 NOTE — Progress Notes (Addendum)
 Palliative Care Progress Note, Assessment & Plan   Patient Name: Selena Pittman       Date: 10/09/2023 DOB: 19-Oct-1950  Age: 73 y.o. MRN#: [id number] Attending Physician: Parris Manna, MD Primary Care Physician: Donal Channing SQUIBB, FNP Admit Date: 09/28/2023  Subjective: Unable to assess  HPI: 73 y.o. female  with past medical history significant for hypotension on midodrine , HLD, left breast cancer, bipolar, anxiety, depression, schizophrenia, schizoaffective disorder, and previous suicide attempt.  She presented to ED via EMS 09/28/2023 from [facility] healthcare complaining of lethargy, hypotension and hypoxia.  EMS reported patient had temp of 99.6 axillary and worsening cough. ED workup showed Na+ 139, K+ 3.0, bicarb 23, BUN 7, creatinine 0.35, GFR > 60, WBC 11.6, Hgb 12.2, platelets 474, troponin 38 => 49.  COVID/flu A-B/RSV negative.  Lactic 1.3. BP 99/67, HR 112, RR 18, SpO2 92% on 2 L O2 and 98.3.   Patient was admitted to [facility] for management of severe sepsis 2/2 PNA, pyuria, multilobar lung infiltrate, acute PE and severe protein calorie malnutrition.   PMT was consulted for assistance with goals of care discussion.   Patient status continued to worsen leading to rapid response being called 10/02/2023 requiring emergent intubation and transfer to ICU.  PMT was unable to complete consult due to this event.   Dr. Malka had long discussion with family at the bedside including CODE STATUS and prognosis 10/02/2023.  Selena Pittman (son) at that time reports he was not comfortable making patient a DNR and wanted to exhaust all options.  6/18 Patient extubated  6/19 Deteriorated overnight requiring re-intubation  6/20 DSS guardian, Selena Pittman, SW, assessed patient at bedside with CCM who recommended  DNR/DNI with one-way extubation if patient fails to respond to medical treatment. DSS guardian to advise after she speaks with her supervisor regarding plan of care.   Summary of counseling/coordination of care: Extensive chart review completed prior to meeting patient including labs, vital signs, imaging, progress notes, orders, and available advanced directive documents from current and previous encounters.   After reviewing the patient's chart, I assesses patient at bedside.   Elderly, ill-appearing female lying in bed. Intubated, sedated. Requiring pressor support. NG with tube feeds. No distress.    Physical Exam Vitals reviewed.  Constitutional:      General: She is not in acute distress.    Appearance: She is ill-appearing.  HENT:     Head: Normocephalic and atraumatic.     Nose:     Comments: NG Pulmonary:     Effort: No respiratory distress.     Comments: Sedated, mechanically ventilated  Skin:    General: Skin is warm and dry.     Coloration: Skin is pale.   Recommendations/Plan: FULL CODE status as previously documented    Continue current supportive interventions Disposition TBD Legal guardian to advise of changes to plan of care  PMT will continue to follow              Total Time 35 minutes   Discussed plan of care with primary RN.   Time spent includes: Detailed review of medical records (labs, imaging, vital signs), medically appropriate exam (mental status, respiratory, cardiac, skin), discussed with treatment team,  counseling and educating patient, family and staff, documenting clinical information, medication management and coordination of care.     Selena Pittman, Selena Pittman Palliative Medicine Team  10/09/2023 10:05 AM  Office [phone]  Pager [phone]

## 2023-10-09 NOTE — Progress Notes (Signed)
 PHARMACY - ANTICOAGULATION CONSULT NOTE  Pharmacy Consult for IV Heparin  Indication: pulmonary embolus  Patient Measurements: Height: 5' 5 (165.1 cm) Weight: 54.6 kg (120 lb 5.9 oz) IBW/kg (Calculated) : 57 HEPARIN  DW (KG): 45.1  Labs: Recent Labs    10/07/23 0540 10/07/23 1952 10/08/23 0449 10/09/23 0510  HGB 7.8*  --  7.9* 8.0*  HCT 23.4*  --  24.4* 25.2*  PLT 229  --  287 322  HEPARINUNFRC 0.37  --  0.53 0.62  CREATININE <0.30* <0.30* <0.30*  --     CrCl cannot be calculated (This lab value cannot be used to calculate CrCl because it is not a number: <0.30).   Medical History: Past Medical History:  Diagnosis Date   Bipolar affective (HCC)    Breast cancer (HCC)    Cancer (HCC) 12/11/2014   INVASIVE MAMMARY CARCINOMA/.left breast/ T2 N0   Cardiomegaly    Depression    Emphysema of lung (HCC)    Endometriosis    Hyperlipidemia    Osteoporosis    Schizoaffective disorder (HCC)    Schizophrenia (HCC)    Severe sepsis (HCC) 04/02/2022   Thyroid  nodule    Vitamin D  deficiency    Assessment: 73 y/o female presenting with altered mental status and hypoxia. PMH significant for bipolar disorder, breast cancer, anxiety, depression, hypothyroidism. CT imaging shows pulmonary embolism.Per chart review, patient is not on anticoagulation prior to admission. Pharmacy has been consulted to re- initiate and manage heparin  infusion.  Patient started Xarelto  (last dose 06/14 0825). Now transitioned back to heparin  drip.  Goal of Therapy:  Heparin  level 0.3-0.7 units/ml aPTT 66 - 102s Monitor platelets by anticoagulation protocol: Yes  06/16 0846 HL 0.13, aPTT 67s, subthera; 650 un/hr 06/16 2023 HL 0.24, subthera; 750 un/hr 06/17 0429 HL 0.31, therapeutic x 1 06/17 1244 HL 0.28, subthera; 850 un/hr 06/17 2252 HL 0.34, therapeutic x 1 06/18 0643 HL 0.40, therapeutic x 2 06/19 0540 HL 0.37, therapeutic X 3  06/20 0449 HL 0.53, therapeutic X 4  06/21 0510 HL 0.62,  therapeutic X 5   Plan:  --Heparin  level is therapeutic x 5 --Continue heparin  infusion at 950 units/hr --Re-check HL & CBC tomorrow AM  Thank you for involving pharmacy in this patient's care.   Ashaun Gaughan D 10/09/2023 5:38 AM

## 2023-10-09 NOTE — Progress Notes (Signed)
 NAME:  Selena Pittman, MRN:  [id number], DOB:  [birth year], LOS: 11 ADMISSION DATE:  09/28/2023   History of Present Illness:  s. Selena Pittman is a 73 year old female with history of hypertension, on midodrine , GERD, insomnia, left breast cancer, anxiety, depression, hypothyroid, bipolar, who presents ED for chief concerns of hypotension and hypoxia at [facility].   Vitals in the ED showed t 98.3, respiration rate of 18, heart rate 112, blood pressure 83/64, improved to 99/67, SpO2 of 92% on 2 L nasal cannula.   Serum sodium is 139, potassium 3.0, chloride one 0.6, bicarb 23, BUN of 7, serum creatinine 0.35, EGFR greater than 60, nonfasting blood glucose 99, WBC 11.6, hemoglobin 12.2, platelets of 474.HS troponin is 38, on repeat is 49. COVID/influenza A/influenza B/RSV PCR were negative.Lactic acid is 1.3. Chest x-ray with concern of atypical pneumonia. CTA chest with small bilateral PE and bilateral infiltrate concerning for pneumonia CT head with no acute abnormality, age-related atrophy and moderate paranasal sinus disease.  CT abdomen and pelvis with concern of enterocolitis and fecal impaction in rectum.   ED treatment: Heparin  bolus and gtt., vancomycin , cefepime , sodium chloride  1.5 liter bolus, midodrine  2.5 mg p.o. one-time dose.  Pertinent  Medical History  As above  Significant Hospital Events: Including procedures, antibiotic start and stop dates in addition to other pertinent events   6/11: Vital stable, preliminary blood cultures negative.  MRI brain with no acute intracranial abnormality.  Mucosal thickening with air-fluid level in left sphenoid and maxillary sinuses more consistent with acute sinusitis. MRSA PCR positive so adding vancomycin . Lower extremity venous Doppler with extensive bilateral DVT, right greater than left.  Echocardiogram was normal. Vascular surgery was consulted to see if she is a candidate for any intervention or IVC filter placement.    6/12: Patient was little more lethargic this morning so ABG was done, it shows pH of 7.51, CO2 30 and mild hypoxia with pO2 of 65.  Placed on 2 L of oxygen.  Later she did woke up.  Procalcitonin negative so checking respiratory viral panel as he might be having viral pneumonia.  Urine cultures with ESBL E. coli-ordered 1 dose of fosfomycin. based on her debility-vascular evaluated her and she will not be a candidate for any intervention.  Heparin  is being switched with Xarelto . Chest PT ordered as she remained quite congested Mild hypokalemia and hypomagnesemia-replating electrolytes 6/13: Patient remained quite congested.  Respiratory viral panel positive for rhino and parainfluenza virus.  Mild hypophosphatemia today which is being repleted. 06/14:  Patient admitted to the ICU, intubated and bronched. Sputum sent for cultutre. 10/03/23: WBC trending up. ESBL in urine culture. Severe hypokalemia. Persistent shock.  10/04/23- patient remains on MV, hyperglycemic this am. Has increased resp secretions which are inspissated.  She remains on levophed  and vasopressin  infusion.  She is heparin  infusion.  She is at risk of re-feeding syndrome due to moderate protein calorie malnutrition.  She had multiple bowel movements overnight.  She is currently being treated for multi drug resistant UTI with viral LRTI due to parainfulenza and rhinovirus.   Palliative care is following.  10/05/23- ESBL, MRSA, parainfluenza 3 and rhinoevirus positive, pharmcy consultant recommendation for zyvox  initiation, CVP monitoring, sedation req is high we are changing to ketamine  from prop due to shock physiology.  She has sacral decubitus stage 3 present on admission.  10/06/23- Patient had no overnight events, continuing wake up asessment, reduced vasopressors by 80% plan to extubate when able. Wound care following. CVP 6  this am.  10/07/23- patient deteriorated overnight with requirement of re-intubation after discussion with her son.   She at Rass negative 1.  Prognosis is poor, palliative care is following. Continuing to treat MSSA CAP and parainfluenza infection.  She also had ecoli UTI and has completed therapy for this.  10/08/23- daughter and granddaughter came in to see patient.  State appointed POA is working with us  on goals of care.  10/09/23- patient continues to be critically ill.  She remains wit poor prognosis and I anticipate possible hospital death  Objective    Blood pressure 117/84, pulse 85, temperature 97.8 F (36.6 C), temperature source Oral, resp. rate (!) 23, height 5' 5 (1.651 m), weight 54.6 kg, SpO2 100%.    Vent Mode: PRVC FiO2 (%):  [40 %] 40 % Set Rate:  [20 bmp] 20 bmp Vt Set:  [350 mL] 350 mL PEEP:  [5 cmH20] 5 cmH20 Plateau Pressure:  [12 cmH20-15 cmH20] 12 cmH20   Intake/Output Summary (Last 24 hours) at 10/09/2023 1009 Last data filed at 10/09/2023 0852 Gross per 24 hour  Intake 2589.03 ml  Output 1707 ml  Net 882.03 ml   Filed Weights   10/06/23 0712 10/07/23 0500 10/08/23 0800  Weight: 54.6 kg 60.4 kg 54.6 kg    Examination: General: Chronically ill, cachectic , intubated and sedated.  HENT: Supple neck, reactive pupils  Lungs: Diffuse ronchi appreciated Cardiovascular: Normal S1, Normal S2, RRR  Abdomen: Soft, non tender, non distended, + BS  Extremities: Warm and well perfused   Labs and imaging were reviewed.    Assessment and Plan  Case of a 73 years old female patient with a past medical history of failure to thrive, hypertension, GERD, insomnia, remote history of breast cancer who is presenting from [facility] to [facility] with acute hypoxic respiratory failure on 09/28/2023.  She was found to have rhinovirus and parainfluenza virus positive.  Course complicated by worsening acute hypoxic respiratory failure requiring intubation mechanical ventilation on 10/02/2023.  Status post bronchoscopy with extensive secretions bilaterally.  1 Acute hypoxic respiratory  failure requiring intubation and mechanical ventilation on 10/02/2023 secondary to Rhinovirus and parainfluenza pneumonia with possible superimposed bacterial pneumonia MSSA and UTI elcoli.  MRSA swab positive. 2 Mucous plugging with extensive secretions secondary to the above  3. Shock-septic present on admission due to UTI with ecoli           UTI with ESBL E.Coli 06/10 , MSSA pneumonia and viral LRTI with rhinovirus/parainfluena 4 Extensive bilateral DVTs and PE (secondary to DVT due to immobilization and current inflammatory process) PE of low risk ft  5  Severe protein calorie malnutrition - refeeding syndrome -monitoring and correcting electrolytes 6.  Sacral decubitus stge 3 -present on admission - patient is bedbound at baseline, wound care consultation  # Bedbound at baseline # Hypertension, hyperlipidemia, insomnia  Neuro: Propofol  for RASS 0 to -1.  Fentanyl  for CPOT less than 2. CVS: Nor epi for MAP greater than 65. Add vasopressin  and stress dose steroids. Heparin  drip.  Pulmonary: heavy phlegm with MV sounds GI: Start trickle feeds. SABRA PPI for prohylaxis,  Renal: Avoid nephrotoxic agents. Monitor UOP.  Heme: Heparin  drip for PE/DVT.  Endo: POC goal 140-180.    Best Practice (right click and Reselect all SmartList Selections daily)   Diet/type: Trickle feed DVT prophylaxis systemic heparin  Pressure ulcer(s): N/A GI prophylaxis: PPI Lines: Left internal jugular CVC, Left radial A-line Foley:  Yes 06/14 Code Status:  full code  Critical care provider statement:   Total critical care time: 32 minutes   Performed by: Parris MD   Critical care time was exclusive of separately billable procedures and treating other patients.   Critical care was necessary to treat or prevent imminent or life-threatening deterioration.   Critical care was time spent personally by me on the following activities: development of treatment plan with patient and/or surrogate as well as  nursing, discussions with consultants, evaluation of patient's response to treatment, examination of patient, obtaining history from patient or surrogate, ordering and performing treatments and interventions, ordering and review of laboratory studies, ordering and review of radiographic studies, pulse oximetry and re-evaluation of patient's condition.    Savon Cobbs, M.D.  Pulmonary & Critical Care Medicine

## 2023-10-09 NOTE — Progress Notes (Signed)
 PHARMACY CONSULT NOTE  Pharmacy Consult for Electrolyte Monitoring and Replacement   Recent Labs: Potassium (mmol/L)  Date Value  10/09/2023 4.4  12/31/2014 4.0   Magnesium  (mg/dL)  Date Value  93/79/7974 2.0   Calcium  (mg/dL)  Date Value  93/78/7974 7.6 (L)   Albumin  (g/dL)  Date Value  93/78/7974 2.4 (L)   Phosphorus (mg/dL)  Date Value  93/78/7974 2.4 (L)   Sodium (mmol/L)  Date Value  10/09/2023 137   Assessment: 73 y/o female presenting with altered mental status and hypoxia. PMH significant for bipolar disorder, breast cancer, anxiety, depression, hypothyroidism. Pharmacy is asked to follow and replace electrolytes while in CCU  Goal of Therapy:  Electrolytes WNL  Plan:  --Phos 2.4, K Phos  Neutral 500 mg per tube q4h x 3 doses per PCCM --Labs tomorrow  Selena Pittman A Selena Pittman 10/09/2023 7:22 AM

## 2023-10-10 DIAGNOSIS — Z711 Person with feared health complaint in whom no diagnosis is made: Secondary | ICD-10-CM | POA: Diagnosis not present

## 2023-10-10 DIAGNOSIS — Z515 Encounter for palliative care: Secondary | ICD-10-CM | POA: Diagnosis not present

## 2023-10-10 DIAGNOSIS — Z66 Do not resuscitate: Secondary | ICD-10-CM

## 2023-10-10 DIAGNOSIS — A4101 Sepsis due to Methicillin susceptible Staphylococcus aureus: Secondary | ICD-10-CM | POA: Diagnosis not present

## 2023-10-10 DIAGNOSIS — Z789 Other specified health status: Secondary | ICD-10-CM | POA: Diagnosis not present

## 2023-10-10 DIAGNOSIS — R531 Weakness: Secondary | ICD-10-CM | POA: Diagnosis not present

## 2023-10-10 LAB — CBC
HCT: 21.2 % — ABNORMAL LOW (ref 36.0–46.0)
Hemoglobin: 6.9 g/dL — ABNORMAL LOW (ref 12.0–15.0)
MCH: 31.8 pg (ref 26.0–34.0)
MCHC: 32.5 g/dL (ref 30.0–36.0)
MCV: 97.7 fL (ref 80.0–100.0)
Platelets: 254 10*3/uL (ref 150–400)
RBC: 2.17 MIL/uL — ABNORMAL LOW (ref 3.87–5.11)
RDW: 16.7 % — ABNORMAL HIGH (ref 11.5–15.5)
WBC: 10.4 10*3/uL (ref 4.0–10.5)
nRBC: 0.2 % (ref 0.0–0.2)

## 2023-10-10 LAB — RENAL FUNCTION PANEL
Albumin: 2.2 g/dL — ABNORMAL LOW (ref 3.5–5.0)
Anion gap: 6 (ref 5–15)
BUN: 14 mg/dL (ref 8–23)
CO2: 28 mmol/L (ref 22–32)
Calcium: 7.3 mg/dL — ABNORMAL LOW (ref 8.9–10.3)
Chloride: 105 mmol/L (ref 98–111)
Creatinine, Ser: <0.3 mg/dL — ABNORMAL LOW (ref 0.44–1.00)
Glucose, Bld: 157 mg/dL — ABNORMAL HIGH (ref 70–99)
Phosphorus: 1.9 mg/dL — ABNORMAL LOW (ref 2.5–4.6)
Potassium: 4.1 mmol/L (ref 3.5–5.1)
Sodium: 139 mmol/L (ref 135–145)

## 2023-10-10 LAB — HEMOGLOBIN AND HEMATOCRIT, BLOOD
HCT: 26.5 % — ABNORMAL LOW (ref 36.0–46.0)
Hemoglobin: 9.1 g/dL — ABNORMAL LOW (ref 12.0–15.0)

## 2023-10-10 LAB — GLUCOSE, CAPILLARY
Glucose-Capillary: 110 mg/dL — ABNORMAL HIGH (ref 70–99)
Glucose-Capillary: 138 mg/dL — ABNORMAL HIGH (ref 70–99)
Glucose-Capillary: 139 mg/dL — ABNORMAL HIGH (ref 70–99)
Glucose-Capillary: 149 mg/dL — ABNORMAL HIGH (ref 70–99)
Glucose-Capillary: 160 mg/dL — ABNORMAL HIGH (ref 70–99)
Glucose-Capillary: 160 mg/dL — ABNORMAL HIGH (ref 70–99)
Glucose-Capillary: 173 mg/dL — ABNORMAL HIGH (ref 70–99)

## 2023-10-10 LAB — HEPARIN LEVEL (UNFRACTIONATED): Heparin Unfractionated: 0.55 [IU]/mL (ref 0.30–0.70)

## 2023-10-10 LAB — MAGNESIUM: Magnesium: 2.1 mg/dL (ref 1.7–2.4)

## 2023-10-10 LAB — PREPARE RBC (CROSSMATCH)

## 2023-10-10 LAB — ABO/RH: ABO/RH(D): O POS

## 2023-10-10 MED ORDER — FUROSEMIDE 10 MG/ML IJ SOLN
40.0000 mg | Freq: Once | INTRAMUSCULAR | Status: AC
  Administered 2023-10-10: 40 mg via INTRAVENOUS
  Filled 2023-10-10: qty 4

## 2023-10-10 MED ORDER — FUROSEMIDE 10 MG/ML IJ SOLN: 40.0000 mg | Freq: Every day | INTRAMUSCULAR | Status: DC

## 2023-10-10 MED ORDER — SODIUM CHLORIDE 0.9% IV SOLUTION: Freq: Once | INTRAVENOUS | Status: AC

## 2023-10-10 MED ORDER — SODIUM CHLORIDE 0.9 % IV SOLN: INTRAVENOUS | Status: AC | PRN

## 2023-10-10 MED ORDER — POTASSIUM PHOSPHATES 15 MMOLE/5ML IV SOLN
21.0000 mmol | Freq: Once | INTRAVENOUS | Status: AC
  Administered 2023-10-10: 21 mmol via INTRAVENOUS
  Filled 2023-10-10: qty 7

## 2023-10-10 MED ORDER — GLYCOPYRROLATE 0.2 MG/ML IJ SOLN
0.3000 mg | Freq: Three times a day (TID) | INTRAMUSCULAR | Status: DC
  Administered 2023-10-10 – 2023-10-11 (×4): 0.3 mg via INTRAVENOUS
  Filled 2023-10-10 (×4): qty 2

## 2023-10-10 MED ORDER — HEPARIN (PORCINE) 25000 UT/250ML-% IV SOLN
950.0000 [IU]/h | INTRAVENOUS | Status: DC
  Administered 2023-10-10 – 2023-10-14 (×4): 950 [IU]/h via INTRAVENOUS
  Filled 2023-10-10 (×3): qty 250

## 2023-10-10 MED ORDER — ZINC OXIDE 11.3 % EX CREA
TOPICAL_CREAM | CUTANEOUS | Status: DC | PRN
  Filled 2023-10-10: qty 1

## 2023-10-10 MED ORDER — MIDODRINE HCL 5 MG PO TABS
5.0000 mg | ORAL_TABLET | Freq: Three times a day (TID) | ORAL | Status: DC
  Administered 2023-10-10 – 2023-10-14 (×11): 5 mg
  Filled 2023-10-10 (×11): qty 1

## 2023-10-10 NOTE — Consult Note (Signed)
 Please note that the [facility] nursing team is utilizing a standardized work plan to manage patient consults. We are triaging consults and will try to see the patients within 48 hours. Wound photos in the patient's chart allow [place] to consult on the patient in the most efficient and timely manner.    Guneet Delpino [facility], CNS, [employer] [phone]

## 2023-10-10 NOTE — Consult Note (Signed)
 WOC Nurse Consult Note: Reason for Consult: worsening sacral pressure injury  Wound type: ICD; incontinence related dermatitis ICD-10 CM Codes for Irritant Dermatitis  L24A2 - Due to fecal, urinary or dual incontinence  Pressure Injury POA:NA Measurement: see nursing flow sheet Wound bed: pink; partial thickness skin loss scattered over the bilateral buttocks and sacrum Drainage (amount, consistency, odor) see nursing flow sheets Periwound: intact Dressing procedure/placement/frequency: Flexiseal in place for bowel incontinence control; limiting exposure to the skin Gerhardt butt cream has been used, but area has been reported to be worsening.  DC Gerhardt's and try triple paste BID; low air loss mattress in place for moisture managment   Re consult if needed, will not follow at this time. Thanks  Maddelynn Moosman [employer], RN,CWOCN, CNS, CWON-AP [phone])

## 2023-10-10 NOTE — Progress Notes (Signed)
 Palliative Care Progress Note, Assessment & Plan   Patient Name: Selena Pittman       Date: 10/10/2023 DOB: 01/14/1951  Age: 74 y.o. MRN#: [id number] Attending Physician: Parris Manna, MD Primary Care Physician: Donal Channing SQUIBB, FNP Admit Date: 09/28/2023  Subjective: Unable to assess  HPI: 73 y.o. female  with past medical history significant for hypotension on midodrine , HLD, left breast cancer, bipolar, anxiety, depression, schizophrenia, schizoaffective disorder, and previous suicide attempt.  She presented to ED via EMS 09/28/2023 from [facility] complaining of lethargy, hypotension and hypoxia.  EMS reported patient had temp of 99.6 axillary and worsening cough. ED workup showed Na+ 139, K+ 3.0, bicarb 23, BUN 7, creatinine 0.35, GFR > 60, WBC 11.6, Hgb 12.2, platelets 474, troponin 38 => 49.  COVID/flu A-B/RSV negative.  Lactic 1.3. BP 99/67, HR 112, RR 18, SpO2 92% on 2 L O2 and 98.3.   Patient was admitted to [facility] for management of severe sepsis 2/2 PNA, pyuria, multilobar lung infiltrate, acute PE and severe protein calorie malnutrition.   PMT was consulted for assistance with goals of care discussion.   Patient status continued to worsen leading to rapid response being called 10/02/2023 requiring emergent intubation and transfer to ICU.  PMT was unable to complete consult due to this event.   Dr. Malka had long discussion with family at the bedside including CODE STATUS and prognosis 10/02/2023.  [name] (son) at that time reports he was not comfortable making patient a DNR and wanted to exhaust all options.   6/18 Patient extubated   6/19 Deteriorated overnight requiring re-intubation   6/20 DSS guardian, [name], SW, assessed patient at bedside with CCM who recommended  DNR/DNI with one-way extubation if patient fails to respond to medical treatment. DSS guardian to advise after she speaks with her supervisor regarding plan of care.   Summary of counseling/coordination of care: Extensive chart review completed prior to meeting patient including labs, vital signs, imaging, progress notes, orders, and available advanced directive documents from current and previous encounters.   After reviewing the patient's chart and assessing the patient at bedside, I spoke with patient's sister in regards to symptom management and goals of care.   Elderly, ill-appearing female lying in bed. Intubated, sedated. Requiring pressor support. NG with tube feeds. No distress.  Patient's sister, [name], at bedside during visit.   Delon shares that she has only seen her sister once over the past several years since she lived in [place] and now in [place] . Discussed course of events and treatments since her sister was admitted 6/10. Delon shares that her sister has been through a lot. She is hopeful that the new legal guardian makes appropriate choices to reduce suffering. Delon shares that her sister would not want to live like this. She understands that patient is critically ill with poor prognosis.   Therapeutic silence and active listening provided for family to share their thoughts and emotions regarding current medical situation.  Emotional support provided.  Patient will be assessed later today for SBT/wake up assessement today.  1320: Called and spoke with [name], DSS, pending SBT and wake up assessment. Candace shares that she had spoken with [name],  NP earlier about patient's poor prognosis. We discussed previous recommendation from ICU provider to consider DNR/DNI status. Candace requests to proceed with DNR/DNI as she and her team feels that it is more appropriate for this patient considering critical illness and poor prognosis. Candace shares that  after discussion with patients son, he is in agreement with DNR/DNI status as well.   ICU care team notified of change in code status.   Physical Exam Vitals reviewed.  Constitutional:      General: She is not in acute distress.    Appearance: She is ill-appearing.  HENT:     Head: Normocephalic and atraumatic.     Nose:     Comments: NG Pulmonary:     Effort: No respiratory distress.     Comments: Sedated, mechanically ventilated  Musculoskeletal:     Comments: Swelling to bilateral hands   Skin:    General: Skin is warm and dry.     Coloration: Skin is pale.   Recommendations/Plan: DNR/DNI per [name], DSS Continue current supportive interventions Disposition TBD PMT will continue to follow             Total Time 50 minutes   Discussed plan of care with Dr. Parris, primary RN and DSS legal guardian.  Time spent includes: Detailed review of medical records (labs, imaging, vital signs), medically appropriate exam (mental status, respiratory, cardiac, skin), discussed with treatment team, counseling and educating patient, family and staff, documenting clinical information, medication management and coordination of care.     Devere Sacks, AMANDA [facility] Palliative Medicine Team  10/10/2023 12:44 PM  Office [phone]  Pager [phone]

## 2023-10-10 NOTE — Progress Notes (Signed)
 PHARMACY - ANTICOAGULATION CONSULT NOTE  Pharmacy Consult for IV Heparin  Indication: pulmonary embolus  Patient Measurements: Height: 5' 5 (165.1 cm) Weight: 64 kg (141 lb 1.5 oz) IBW/kg (Calculated) : 57 HEPARIN  DW (KG): 64  Labs: Recent Labs    10/08/23 0449 10/09/23 0510 10/10/23 0417 10/10/23 1354  HGB 7.9* 8.0* 6.9* 9.1*  HCT 24.4* 25.2* 21.2* 26.5*  PLT 287 322 254  --   HEPARINUNFRC 0.53 0.62 0.55  --   CREATININE <0.30* <0.30* <0.30*  --     CrCl cannot be calculated (This lab value cannot be used to calculate CrCl because it is not a number: <0.30).   Medical History: Past Medical History:  Diagnosis Date   Bipolar affective (HCC)    Breast cancer (HCC)    Cancer (HCC) 12/11/2014   INVASIVE MAMMARY CARCINOMA/.left breast/ T2 N0   Cardiomegaly    Depression    Emphysema of lung (HCC)    Endometriosis    Hyperlipidemia    Osteoporosis    Schizoaffective disorder (HCC)    Schizophrenia (HCC)    Severe sepsis (HCC) 04/02/2022   Thyroid  nodule    Vitamin D  deficiency    Assessment: 73 y/o female presenting with altered mental status and hypoxia. PMH significant for bipolar disorder, breast cancer, anxiety, depression, hypothyroidism. CT imaging shows pulmonary embolism.Per chart review, patient is not on anticoagulation prior to admission. Pharmacy has been consulted to re- initiate and manage heparin  infusion.  Patient started Xarelto  (last dose 06/14 0825). Now transitioned back to heparin  drip.  Goal of Therapy:  Heparin  level 0.3-0.7 units/ml aPTT 66 - 102s Monitor platelets by anticoagulation protocol: Yes  06/16 0846 HL 0.13, aPTT 67s, subthera; 650 un/hr 06/16 2023 HL 0.24, subthera; 750 un/hr 06/17 0429 HL 0.31, therapeutic x 1 06/17 1244 HL 0.28, subthera; 850 un/hr 06/17 2252 HL 0.34, therapeutic x 1 06/18 0643 HL 0.40, therapeutic x 2 06/19 0540 HL 0.37, therapeutic X 3  06/20 0449 HL 0.53, therapeutic X 4  06/21 0510 HL 0.62,  therapeutic X 5  06/22 0417 HL 0.55, therapeutic X 6 ------------------------------------------------ 06/22 2043 Heparin  drip restarted  Plan:  --Resume heparin  infusion at 950 units/hr --Check heparin  level 8 hours after restart --CBC daily  Thank you for involving pharmacy in this patient's care.   Will M. Lenon, PharmD Clinical Pharmacist 10/10/2023 9:00 PM

## 2023-10-10 NOTE — Progress Notes (Signed)
 PHARMACY - ANTICOAGULATION CONSULT NOTE  Pharmacy Consult for IV Heparin  Indication: pulmonary embolus  Patient Measurements: Height: 5' 5 (165.1 cm) Weight: 54.6 kg (120 lb 5.9 oz) IBW/kg (Calculated) : 57 HEPARIN  DW (KG): 45.1  Labs: Recent Labs    10/08/23 0449 10/09/23 0510 10/10/23 0417  HGB 7.9* 8.0* 6.9*  HCT 24.4* 25.2* 21.2*  PLT 287 322 254  HEPARINUNFRC 0.53 0.62 0.55  CREATININE <0.30* <0.30* <0.30*    CrCl cannot be calculated (This lab value cannot be used to calculate CrCl because it is not a number: <0.30).   Medical History: Past Medical History:  Diagnosis Date   Bipolar affective (HCC)    Breast cancer (HCC)    Cancer (HCC) 12/11/2014   INVASIVE MAMMARY CARCINOMA/.left breast/ T2 N0   Cardiomegaly    Depression    Emphysema of lung (HCC)    Endometriosis    Hyperlipidemia    Osteoporosis    Schizoaffective disorder (HCC)    Schizophrenia (HCC)    Severe sepsis (HCC) 04/02/2022   Thyroid  nodule    Vitamin D  deficiency    Assessment: 73 y/o female presenting with altered mental status and hypoxia. PMH significant for bipolar disorder, breast cancer, anxiety, depression, hypothyroidism. CT imaging shows pulmonary embolism.Per chart review, patient is not on anticoagulation prior to admission. Pharmacy has been consulted to re- initiate and manage heparin  infusion.  Patient started Xarelto  (last dose 06/14 0825). Now transitioned back to heparin  drip.  Goal of Therapy:  Heparin  level 0.3-0.7 units/ml aPTT 66 - 102s Monitor platelets by anticoagulation protocol: Yes  06/16 0846 HL 0.13, aPTT 67s, subthera; 650 un/hr 06/16 2023 HL 0.24, subthera; 750 un/hr 06/17 0429 HL 0.31, therapeutic x 1 06/17 1244 HL 0.28, subthera; 850 un/hr 06/17 2252 HL 0.34, therapeutic x 1 06/18 0643 HL 0.40, therapeutic x 2 06/19 0540 HL 0.37, therapeutic X 3  06/20 0449 HL 0.53, therapeutic X 4  06/21 0510 HL 0.62, therapeutic X 5  06/22 0417 HL 0.55,  therapeutic X 6  Plan:  --Heparin  level is therapeutic x 6 --Continue heparin  infusion at 950 units/hr --Re-check HL & CBC tomorrow AM  Thank you for involving pharmacy in this patient's care.   Emrie Gayle D 10/10/2023 4:48 AM

## 2023-10-10 NOTE — Progress Notes (Signed)
 PHARMACY CONSULT NOTE  Pharmacy Consult for Electrolyte Monitoring and Replacement   Recent Labs: Potassium (mmol/L)  Date Value  10/10/2023 4.1  12/31/2014 4.0   Magnesium  (mg/dL)  Date Value  93/79/7974 2.0   Calcium  (mg/dL)  Date Value  93/77/7974 7.3 (L)   Albumin  (g/dL)  Date Value  93/77/7974 2.2 (L)   Phosphorus (mg/dL)  Date Value  93/77/7974 1.9 (L)   Sodium (mmol/L)  Date Value  10/10/2023 139   Assessment: 73 y/o female presenting with altered mental status and hypoxia. PMH significant for bipolar disorder, breast cancer, anxiety, depression, hypothyroidism. Pharmacy is asked to follow and replace electrolytes while in CCU  Goal of Therapy:  Electrolytes WNL  Plan:  --Phos 1.9, Kphos 21 mmol IV x 1 --Labs tomorrow  Idolina DELENA Percy 10/10/2023 7:27 AM

## 2023-10-10 NOTE — Progress Notes (Signed)
 NAME:  Selena Pittman, MRN:  [id number], DOB:  1950-06-12, LOS: 12 ADMISSION DATE:  09/28/2023   History of Present Illness:  s. Selena Pittman is a 73 year old female with history of hypertension, on midodrine , GERD, insomnia, left breast cancer, anxiety, depression, hypothyroid, bipolar, who presents ED for chief concerns of hypotension and hypoxia at [facility] house.   Vitals in the ED showed t 98.3, respiration rate of 18, heart rate 112, blood pressure 83/64, improved to 99/67, SpO2 of 92% on 2 L nasal cannula.   Serum sodium is 139, potassium 3.0, chloride one 0.6, bicarb 23, BUN of 7, serum creatinine 0.35, EGFR greater than 60, nonfasting blood glucose 99, WBC 11.6, hemoglobin 12.2, platelets of 474.HS troponin is 38, on repeat is 49. COVID/influenza A/influenza B/RSV PCR were negative.Lactic acid is 1.3. Chest x-ray with concern of atypical pneumonia. CTA chest with small bilateral PE and bilateral infiltrate concerning for pneumonia CT head with no acute abnormality, age-related atrophy and moderate paranasal sinus disease.  CT abdomen and pelvis with concern of enterocolitis and fecal impaction in rectum.   ED treatment: Heparin  bolus and gtt., vancomycin , cefepime , sodium chloride  1.5 liter bolus, midodrine  2.5 mg p.o. one-time dose.  Pertinent  Medical History  As above  Significant Hospital Events: Including procedures, antibiotic start and stop dates in addition to other pertinent events   6/11: Vital stable, preliminary blood cultures negative.  MRI brain with no acute intracranial abnormality.  Mucosal thickening with air-fluid level in left sphenoid and maxillary sinuses more consistent with acute sinusitis. MRSA PCR positive so adding vancomycin . Lower extremity venous Doppler with extensive bilateral DVT, right greater than left.  Echocardiogram was normal. Vascular surgery was consulted to see if she is a candidate for any intervention or IVC filter placement.    6/12: Patient was little more lethargic this morning so ABG was done, it shows pH of 7.51, CO2 30 and mild hypoxia with pO2 of 65.  Placed on 2 L of oxygen.  Later she did woke up.  Procalcitonin negative so checking respiratory viral panel as he might be having viral pneumonia.  Urine cultures with ESBL E. coli-ordered 1 dose of fosfomycin. based on her debility-vascular evaluated her and she will not be a candidate for any intervention.  Heparin  is being switched with Xarelto . Chest PT ordered as she remained quite congested Mild hypokalemia and hypomagnesemia-replating electrolytes 6/13: Patient remained quite congested.  Respiratory viral panel positive for rhino and parainfluenza virus.  Mild hypophosphatemia today which is being repleted. 06/14:  Patient admitted to the ICU, intubated and bronched. Sputum sent for cultutre. 10/03/23: WBC trending up. ESBL in urine culture. Severe hypokalemia. Persistent shock.  10/04/23- patient remains on MV, hyperglycemic this am. Has increased resp secretions which are inspissated.  She remains on levophed  and vasopressin  infusion.  She is heparin  infusion.  She is at risk of re-feeding syndrome due to moderate protein calorie malnutrition.  She had multiple bowel movements overnight.  She is currently being treated for multi drug resistant UTI with viral LRTI due to parainfulenza and rhinovirus.   Palliative care is following.  10/05/23- ESBL, MRSA, parainfluenza 3 and rhinoevirus positive, pharmcy consultant recommendation for zyvox  initiation, CVP monitoring, sedation req is high we are changing to ketamine  from prop due to shock physiology.  She has sacral decubitus stage 3 present on admission.  10/06/23- Patient had no overnight events, continuing wake up asessment, reduced vasopressors by 80% plan to extubate when able. Wound care following. CVP 6  this am.  10/07/23- patient deteriorated overnight with requirement of re-intubation after discussion with her son.   She at Rass negative 1.  Prognosis is poor, palliative care is following. Continuing to treat MSSA CAP and parainfluenza infection.  She also had ecoli UTI and has completed therapy for this.  10/08/23- daughter and granddaughter came in to see patient.  State appointed POA is working with us  on goals of care.  10/09/23- patient continues to be critically ill.  She remains wit poor prognosis and I anticipate possible hospital death 11/07/2023- patient remains critically ill, she did SBT and wake up asessment today which she failed. Family including son and sister were at bedside and we reivewed medical plan and hospital course.  We spoke with state appointed POA today.  Her code status is now DNR/DNI  Objective    Blood pressure 108/76, pulse 95, temperature 97.9 F (36.6 C), temperature source Esophageal, resp. rate (!) 24, height 5' 5 (1.651 m), weight 55.7 kg, SpO2 100%. CVP:  [5 mmHg-8 mmHg] 7 mmHg  Vent Mode: PRVC FiO2 (%):  [40 %] 40 % Set Rate:  [20 bmp] 20 bmp Vt Set:  [350 mL] 350 mL PEEP:  [5 cmH20] 5 cmH20 Plateau Pressure:  [10 cmH20-16 cmH20] 15 cmH20   Intake/Output Summary (Last 24 hours) at Nov 07, 2023 0905 Last data filed at 11/07/2023 0800 Gross per 24 hour  Intake 2421.32 ml  Output 1310 ml  Net 1111.32 ml   Filed Weights   10/07/23 0500 10/08/23 0800 2023/11/07 0500  Weight: 60.4 kg 54.6 kg 55.7 kg    Examination: General: Chronically ill, cachectic , intubated and sedated.  HENT: Supple neck, reactive pupils  Lungs: Diffuse ronchi appreciated Cardiovascular: Normal S1, Normal S2, RRR  Abdomen: Soft, non tender, non distended, + BS  Extremities: Warm and well perfused   Labs and imaging were reviewed.    Assessment and Plan  Case of a 73 years old female patient with a past medical history of failure to thrive, hypertension, GERD, insomnia, remote history of breast cancer who is presenting from [place] nursing home to [facility] with acute hypoxic respiratory failure on  09/28/2023.  She was found to have rhinovirus and parainfluenza virus positive.  Course complicated by worsening acute hypoxic respiratory failure requiring intubation mechanical ventilation on 10/02/2023.  Status post bronchoscopy with extensive secretions bilaterally.  1 Acute hypoxic respiratory failure requiring intubation and mechanical ventilation on 10/02/2023 secondary to Rhinovirus and parainfluenza pneumonia with possible superimposed bacterial pneumonia MSSA and UTI elcoli.  MRSA swab positive. 2 Mucous plugging with extensive secretions secondary to the above  3. Shock-septic present on admission due to UTI with ecoli           UTI with ESBL E.Coli 06/10 , MSSA pneumonia and viral LRTI with rhinovirus/parainfluena 4 Extensive bilateral DVTs and PE (secondary to DVT due to immobilization and current inflammatory process) PE of low risk ft  5  Severe protein calorie malnutrition - refeeding syndrome -monitoring and correcting electrolytes 6.  Sacral decubitus stge 3 -present on admission - patient is bedbound at baseline, wound care consultation  # Bedbound at baseline # Hypertension, hyperlipidemia, insomnia  Neuro: Propofol  for RASS 0 to -1.  Fentanyl  for CPOT less than 2. CVS: Nor epi for MAP greater than 65. Add vasopressin  and stress dose steroids. Heparin  drip.  Pulmonary: heavy phlegm with MV sounds GI: Start trickle feeds. SABRA PPI for prohylaxis,  Renal: Avoid nephrotoxic agents. Monitor UOP.  Heme: Heparin  drip for  PE/DVT.  Endo: POC goal 140-180.    Best Practice (right click and Reselect all SmartList Selections daily)   Diet/type: Trickle feed DVT prophylaxis systemic heparin  Pressure ulcer(s): N/A GI prophylaxis: PPI Lines: Left internal jugular CVC, Left radial A-line Foley:  Yes 06/14 Code Status:  full code  Critical care provider statement:   Total critical care time: 62 minutes   Performed by: Parris MD   Critical care time was exclusive of  separately billable procedures and treating other patients.   Critical care was necessary to treat or prevent imminent or life-threatening deterioration.   Critical care was time spent personally by me on the following activities: development of treatment plan with patient and/or surrogate as well as nursing, discussions with consultants, evaluation of patient's response to treatment, examination of patient, obtaining history from patient or surrogate, ordering and performing treatments and interventions, ordering and review of laboratory studies, ordering and review of radiographic studies, pulse oximetry and re-evaluation of patient's condition.    Selena Pittman, M.D.  Pulmonary & Critical Care Medicine

## 2023-10-11 DIAGNOSIS — J9601 Acute respiratory failure with hypoxia: Secondary | ICD-10-CM | POA: Diagnosis not present

## 2023-10-11 DIAGNOSIS — G9341 Metabolic encephalopathy: Secondary | ICD-10-CM

## 2023-10-11 DIAGNOSIS — Z66 Do not resuscitate: Secondary | ICD-10-CM | POA: Diagnosis not present

## 2023-10-11 DIAGNOSIS — J15211 Pneumonia due to Methicillin susceptible Staphylococcus aureus: Secondary | ICD-10-CM

## 2023-10-11 DIAGNOSIS — Z7189 Other specified counseling: Secondary | ICD-10-CM | POA: Diagnosis not present

## 2023-10-11 DIAGNOSIS — A4101 Sepsis due to Methicillin susceptible Staphylococcus aureus: Secondary | ICD-10-CM | POA: Diagnosis not present

## 2023-10-11 DIAGNOSIS — Z515 Encounter for palliative care: Secondary | ICD-10-CM | POA: Diagnosis not present

## 2023-10-11 DIAGNOSIS — I2699 Other pulmonary embolism without acute cor pulmonale: Secondary | ICD-10-CM | POA: Diagnosis not present

## 2023-10-11 DIAGNOSIS — R531 Weakness: Secondary | ICD-10-CM | POA: Diagnosis not present

## 2023-10-11 LAB — TYPE AND SCREEN
ABO/RH(D): O POS
Antibody Screen: NEGATIVE
Unit division: 0

## 2023-10-11 LAB — BASIC METABOLIC PANEL WITH GFR
Anion gap: 7 (ref 5–15)
BUN: 12 mg/dL (ref 8–23)
CO2: 30 mmol/L (ref 22–32)
Calcium: 7.6 mg/dL — ABNORMAL LOW (ref 8.9–10.3)
Chloride: 101 mmol/L (ref 98–111)
Creatinine, Ser: <0.3 mg/dL — ABNORMAL LOW (ref 0.44–1.00)
Glucose, Bld: 127 mg/dL — ABNORMAL HIGH (ref 70–99)
Potassium: 3.7 mmol/L (ref 3.5–5.1)
Sodium: 138 mmol/L (ref 135–145)

## 2023-10-11 LAB — PHOSPHORUS
Phosphorus: 2.2 mg/dL — ABNORMAL LOW (ref 2.5–4.6)
Phosphorus: 2 mg/dL — ABNORMAL LOW (ref 2.5–4.6)

## 2023-10-11 LAB — CBC
HCT: 25.6 % — ABNORMAL LOW (ref 36.0–46.0)
Hemoglobin: 8.6 g/dL — ABNORMAL LOW (ref 12.0–15.0)
MCH: 32.2 pg (ref 26.0–34.0)
MCHC: 33.6 g/dL (ref 30.0–36.0)
MCV: 95.9 fL (ref 80.0–100.0)
Platelets: 238 10*3/uL (ref 150–400)
RBC: 2.67 MIL/uL — ABNORMAL LOW (ref 3.87–5.11)
RDW: 16.1 % — ABNORMAL HIGH (ref 11.5–15.5)
WBC: 7.1 10*3/uL (ref 4.0–10.5)
nRBC: 0 % (ref 0.0–0.2)

## 2023-10-11 LAB — HEPARIN LEVEL (UNFRACTIONATED): Heparin Unfractionated: 0.41 [IU]/mL (ref 0.30–0.70)

## 2023-10-11 LAB — BPAM RBC
Blood Product Expiration Date: 202507222359
ISSUE DATE / TIME: 202506220850
Unit Type and Rh: 5100

## 2023-10-11 LAB — GLUCOSE, CAPILLARY
Glucose-Capillary: 126 mg/dL — ABNORMAL HIGH (ref 70–99)
Glucose-Capillary: 126 mg/dL — ABNORMAL HIGH (ref 70–99)
Glucose-Capillary: 138 mg/dL — ABNORMAL HIGH (ref 70–99)
Glucose-Capillary: 136 mg/dL — ABNORMAL HIGH (ref 70–99)
Glucose-Capillary: 120 mg/dL — ABNORMAL HIGH (ref 70–99)
Glucose-Capillary: 121 mg/dL — ABNORMAL HIGH (ref 70–99)

## 2023-10-11 LAB — POTASSIUM: Potassium: 3.7 mmol/L (ref 3.5–5.1)

## 2023-10-11 LAB — MAGNESIUM: Magnesium: 2 mg/dL (ref 1.7–2.4)

## 2023-10-11 MED ORDER — POTASSIUM & SODIUM PHOSPHATES 280-160-250 MG PO PACK
2.0000 | PACK | ORAL | Status: AC
  Administered 2023-10-11 – 2023-10-12 (×3): 2
  Filled 2023-10-11 (×3): qty 2

## 2023-10-11 MED ORDER — FUROSEMIDE 10 MG/ML IJ SOLN
40.0000 mg | Freq: Once | INTRAMUSCULAR | Status: AC
  Administered 2023-10-11: 40 mg via INTRAVENOUS
  Filled 2023-10-11: qty 4

## 2023-10-11 MED ORDER — POTASSIUM CHLORIDE 20 MEQ PO PACK
40.0000 meq | PACK | Freq: Once | ORAL | Status: AC
  Administered 2023-10-11: 40 meq
  Filled 2023-10-11: qty 2

## 2023-10-11 MED ORDER — HYDROCORTISONE SOD SUC (PF) 100 MG IJ SOLR
100.0000 mg | Freq: Two times a day (BID) | INTRAMUSCULAR | Status: DC
  Administered 2023-10-11 – 2023-10-12 (×2): 100 mg via INTRAVENOUS
  Filled 2023-10-11 (×2): qty 2

## 2023-10-11 NOTE — TOC Progression Note (Signed)
 Transition of Care [facility]) - Progression Note    Patient Details  Name: Selena Pittman MRN: [id number] Date of Birth: 21-Jun-1950  Transition of Care [facility]) CM/SW Contact  Davey Limas A Edyth Glomb, RN Phone Number: 10/11/2023, 10:35 AM  Clinical Narrative:    Chart reviewed.  Noted that patient remains on minimal vent support.  Patient is currently not requiring any vasopressors at this time.  Mental status precluding extubation.    Medical team to follow up with DSS to discuss goals of care.    TOC will continue to follow for discharge planning.     Expected Discharge Plan: Skilled Nursing Facility Barriers to Discharge: Continued Medical Work up  Expected Discharge Plan and Services       Living arrangements for the past 2 months: Skilled Nursing Facility                                       Social Determinants of Health (SDOH) Interventions SDOH Screenings   Food Insecurity: No Food Insecurity (10/29/2022)  Housing: Low Risk  (10/29/2022)  Transportation Needs: No Transportation Needs (10/29/2022)  Utilities: Not At Risk (10/29/2022)  Social Connections: Patient Unable To Answer (09/29/2023)  Tobacco Use: High Risk (09/28/2023)    Readmission Risk Interventions    09/30/2023   10:38 AM 07/30/2023   12:52 PM  Readmission Risk Prevention Plan  Transportation Screening Complete Complete  PCP or Specialist Appt within 3-5 Days Complete   HRI or Home Care Consult  Complete  Social Work Consult for Recovery Care Planning/Counseling Complete   Palliative Care Screening Not Applicable   Medication Review Oceanographer) Complete Complete

## 2023-10-11 NOTE — Progress Notes (Signed)
 NAME:  Selena Pittman, MRN:  [id number], DOB:  [birth year], LOS: 13 ADMISSION DATE:  09/28/2023, CONSULTATION DATE:  10/02/2023 REFERRING MD:  Dr. Caleen, CHIEF COMPLAINT:  Hypotension, hypoxia    Brief Pt Description / Synopsis:  73 y.o. female admitted with Acute Hypoxic Respiratory Failure and Severe Sepsis with Septic Shock due to MSSA Pneumonia, Rhinovirus/Parainfluenza viral pneumonia, and ESBL E. Coli UTI, along with Bilateral DVT's and PE, and Acute Metabolic Encephalopathy requiring intubation and mechanical ventilation.  Has failed 1 trial of extubation.  History of Present Illness:   Selena Pittman is a 73 year old female with history of hypertension, on midodrine , GERD, insomnia, left breast cancer, anxiety, depression, hypothyroid, bipolar, who presents ED for chief concerns of hypotension and hypoxia at [facility] house.   Vitals in the ED showed t 98.3, respiration rate of 18, heart rate 112, blood pressure 83/64, improved to 99/67, SpO2 of 92% on 2 L nasal cannula.   Serum sodium is 139, potassium 3.0, chloride one 0.6, bicarb 23, BUN of 7, serum creatinine 0.35, EGFR greater than 60, nonfasting blood glucose 99, WBC 11.6, hemoglobin 12.2, platelets of 474.HS troponin is 38, on repeat is 49. COVID/influenza A/influenza B/RSV PCR were negative.Lactic acid is 1.3. Chest x-ray with concern of atypical pneumonia. CTA chest with small bilateral PE and bilateral infiltrate concerning for pneumonia CT head with no acute abnormality, age-related atrophy and moderate paranasal sinus disease.  CT abdomen and pelvis with concern of enterocolitis and fecal impaction in rectum.   ED treatment: Heparin  bolus and gtt., vancomycin , cefepime , sodium chloride  1.5 liter bolus, midodrine  2.5 mg p.o. one-time dose.  Please see Significant Hospital Events section below for full detailed hospital course.   Pertinent  Medical History   Past Medical History:  Diagnosis Date   Bipolar affective  (HCC)    Breast cancer (HCC)    Cancer (HCC) 12/11/2014   INVASIVE MAMMARY CARCINOMA/.left breast/ T2 N0   Cardiomegaly    Depression    Emphysema of lung (HCC)    Endometriosis    Hyperlipidemia    Osteoporosis    Schizoaffective disorder (HCC)    Schizophrenia (HCC)    Severe sepsis (HCC) 04/02/2022   Thyroid  nodule    Vitamin D  deficiency     Micro Data:  6/10: SARS-CoV-2/flu/RSV PCR>> negative 6/10: Blood culture x 2>> no growth 6/10: Urine>> ESBL E. Coli 6/11: MRSA PCR + 6/12: RVP>> + rhinovirus & parainfluenza virus 6/14: Tracheal aspirate>> MSSA 6/15: Strep pneumo and Legionella urine antigens>> negative 6/15: Repeat blood cultures>> no growth 6/16: BAL>> MSSA  Antimicrobials:   Anti-infectives (From admission, onward)    Start     Dose/Rate Route Frequency Ordered Stop   10/08/23 0600  ceFAZolin  (ANCEF ) IVPB 2g/100 mL premix        2 g 200 mL/hr over 30 Minutes Intravenous Every 8 hours 10/07/23 1045 10/09/23 2211   10/05/23 1100  linezolid  (ZYVOX ) IVPB 600 mg  Status:  Discontinued        600 mg 300 mL/hr over 60 Minutes Intravenous Every 12 hours 10/05/23 1013 10/07/23 1044   10/03/23 1600  cefTRIAXone  (ROCEPHIN ) 2 g in sodium chloride  0.9 % 100 mL IVPB  Status:  Discontinued        2 g 200 mL/hr over 30 Minutes Intravenous Every 24 hours 10/03/23 0607 10/03/23 0801   10/03/23 1500  vancomycin  (VANCOREADY) IVPB 500 mg/100 mL  Status:  Discontinued        500 mg 100 mL/hr over 60  Minutes Intravenous Every 24 hours 10/03/23 0623 10/04/23 1011   10/03/23 1000  meropenem  (MERREM ) 1 g in sodium chloride  0.9 % 100 mL IVPB        1 g 200 mL/hr over 30 Minutes Intravenous Every 8 hours 10/03/23 0801 10/07/23 1802   10/01/23 1800  azithromycin  (ZITHROMAX ) tablet 500 mg  Status:  Discontinued        500 mg Oral Daily-1800 09/30/23 0956 10/03/23 0808   10/01/23 1000  fosfomycin (MONUROL ) packet 3 g        3 g Oral  Once 10/01/23 0807 10/01/23 0956   09/30/23 1200   fosfomycin (MONUROL ) packet 3 g  Status:  Discontinued        3 g Oral  Once 09/30/23 1025 10/01/23 0807   09/29/23 1700  cefTRIAXone  (ROCEPHIN ) 2 g in sodium chloride  0.9 % 100 mL IVPB        2 g 200 mL/hr over 30 Minutes Intravenous Every 24 hours 09/28/23 1703 10/02/23 1740   09/29/23 1400  vancomycin  (VANCOREADY) IVPB 500 mg/100 mL        500 mg 100 mL/hr over 60 Minutes Intravenous Every 24 hours 09/29/23 1007 10/02/23 1604   09/28/23 1715  azithromycin  (ZITHROMAX ) 500 mg in sodium chloride  0.9 % 250 mL IVPB        500 mg 250 mL/hr over 60 Minutes Intravenous Every 24 hours 09/28/23 1703 09/30/23 1841   09/28/23 1200  ceFEPIme  (MAXIPIME ) 2 g in sodium chloride  0.9 % 100 mL IVPB        2 g 200 mL/hr over 30 Minutes Intravenous  Once 09/28/23 1148 09/28/23 1226   09/28/23 1200  metroNIDAZOLE  (FLAGYL ) IVPB 500 mg        500 mg 100 mL/hr over 60 Minutes Intravenous  Once 09/28/23 1148 09/28/23 1354   09/28/23 1200  vancomycin  (VANCOCIN ) IVPB 1000 mg/200 mL premix        1,000 mg 200 mL/hr over 60 Minutes Intravenous  Once 09/28/23 1148 09/28/23 1455       Significant Hospital Events: Including procedures, antibiotic start and stop dates in addition to other pertinent events   6/11: Vital stable, preliminary blood cultures negative.  MRI brain with no acute intracranial abnormality.  Mucosal thickening with air-fluid level in left sphenoid and maxillary sinuses more consistent with acute sinusitis. MRSA PCR positive so adding vancomycin . Lower extremity venous Doppler with extensive bilateral DVT, right greater than left.  Echocardiogram was normal. Vascular surgery was consulted to see if she is a candidate for any intervention or IVC filter placement.   6/12: Patient was little more lethargic this morning so ABG was done, it shows pH of 7.51, CO2 30 and mild hypoxia with pO2 of 65.  Placed on 2 L of oxygen.  Later she did woke up.  Procalcitonin negative so checking respiratory viral  panel as he might be having viral pneumonia.  Urine cultures with ESBL E. coli-ordered 1 dose of fosfomycin. based on her debility-vascular evaluated her and she will not be a candidate for any intervention.  Heparin  is being switched with Xarelto . Chest PT ordered as she remained quite congested Mild hypokalemia and hypomagnesemia-replating electrolytes 6/13: Patient remained quite congested.  Respiratory viral panel positive for rhino and parainfluenza virus.  Mild hypophosphatemia today which is being repleted. 06/14:  Patient admitted to the ICU, intubated and bronched. Sputum sent for cultutre. 10/03/23: WBC trending up. ESBL in urine culture. Severe hypokalemia. Persistent shock.  10/04/23- patient remains  on MV, hyperglycemic this am. Has increased resp secretions which are inspissated.  She remains on levophed  and vasopressin  infusion.  She is heparin  infusion.  She is at risk of re-feeding syndrome due to moderate protein calorie malnutrition.  She had multiple bowel movements overnight.  She is currently being treated for multi drug resistant UTI with viral LRTI due to parainfulenza and rhinovirus.   Palliative care is following.  10/05/23- ESBL, MRSA, parainfluenza 3 and rhinoevirus positive, pharmcy consultant recommendation for zyvox  initiation, CVP monitoring, sedation req is high we are changing to ketamine  from prop due to shock physiology.  She has sacral decubitus stage 3 present on admission.  10/06/23- Patient had no overnight events, continuing wake up asessment, reduced vasopressors by 80% plan to extubate when able. Wound care following. CVP 6 this am.  10/07/23- patient deteriorated overnight with requirement of re-intubation after discussion with her son.  She at Rass negative 1.  Prognosis is poor, palliative care is following. Continuing to treat MSSA CAP and parainfluenza infection.  She also had ecoli UTI and has completed therapy for this.  10/08/23- daughter and granddaughter came in  to see patient.  State appointed POA is working with us  on goals of care.  10/09/23- patient continues to be critically ill.  She remains wit poor prognosis and I anticipate possible hospital death 10/04/2023- patient remains critically ill, she did SBT and wake up asessment today which she failed. Family including son and sister were at bedside and we reivewed medical plan and hospital course.  We spoke with state appointed POA today.  Her code status is now DNR/DNI 09/23/2023: No significant events noted overnight, afebrile, hemodynamically stable, vasopressors weaned off. Diurese with 40 mg IV Lasix  x1 dose.  On minimal vent support, will exercise and PSV as tolerated.  Mental status currently precluding extubation.  Interim History / Subjective:  As outlined above under Significant Hospital Events section  Objective   Blood pressure (!) 95/59, pulse (!) 109, temperature 98.4 F (36.9 C), resp. rate 18, height 5' 5 (1.651 m), weight 64.1 kg, SpO2 99%. CVP:  [3 mmHg] 3 mmHg  Vent Mode: PRVC FiO2 (%):  [40 %] 40 % Set Rate:  [20 bmp] 20 bmp Vt Set:  [350 mL] 350 mL PEEP:  [5 cmH20] 5 cmH20 Plateau Pressure:  [15 cmH20-16 cmH20] 15 cmH20   Intake/Output Summary (Last 24 hours) at 10/11/2023 9293 Last data filed at 10/11/2023 0700 Gross per 24 hour  Intake 2782.65 ml  Output 3475 ml  Net -692.35 ml   Filed Weights   09/19/2023 0500 09/23/2023 1900 10/11/23 0500  Weight: 55.7 kg 64 kg 64.1 kg    Examination: General: Critically ill on chronically ill-appearing frail elderly female, laying in bed, intubated, off sedation, no acute distress HENT: Atraumatic, normocephalic, neck supple, no JVD Lungs: Coarse breath sounds throughout, even, nonlabored, occasionally overbreathing the vent Cardiovascular: Tachycardia, regular rhythm, S1-S2, no murmurs, rubs, gallops Abdomen: Soft, nontender, nondistended, no guarding or rebound tenderness, bowel sounds positive x 4 Extremities: Normal bulk and  tone, 3+ pitting edema bilateral lower extremities and anasarca Neuro: Off sedation, withdraws from pain, intermittently opens eyes to pain, currently not following commands, pupils PERRLA GU: Foley catheter in place draining yellow urine  Resolved Hospital Problem list     Assessment & Plan:   #Acute Hypoxic Respiratory Failure due to ... #MSSA Pneumonia & Rhinovirus/Parainfluenza Viral Pneumonia #Pulmonary Embolism Failed trial of extubation on 6/18 requiring reintubation on 6/19 -CTa Chest on 6/10  with small pulmonary embolisms to RLL and LLL (no evidence of ventricular dysfunctions) and pneumonia -Full vent support, implement lung protective strategies -Plateau pressures less than 30 cm H20 -Wean FiO2 & PEEP as tolerated to maintain O2 sats >92% -Follow intermittent Chest X-ray & ABG as needed -Spontaneous Breathing Trials when respiratory parameters met and mental status permits -Implement VAP Bundle -Bronchodilators -Completed ABX as above -Continue Heparin  gtt -Diuresis as BP and renal function permits ~ give 40 mg IV Lasix  x1 dose on 6/23  #Shock: Septic ~ RESOLVED #Mildly Elevated Troponin due to demand ischemia  PMHx: HTN, HLD -Echocardiogram 09/29/23: LVEF 60-65%, normal diastolic parameters, RV systolic function not well visualized, RV size normal -Continuous cardiac monitoring -Maintain MAP >65 -Vasopressors as needed to maintain MAP goal ~ weaned off -Continue Midodrine  5 mg TID -Lactic acid has normalized -HS Troponin peaked at 49  #Severe Sepsis due to ... #MSSA Pneumonia #Rhinovirus/Parainfluenza Viral Pneumonia #ESBL E. Coli UTI -Monitor fever curve -Trend WBC's  -Follow cultures as above -Completed course of ABX as above  #Bilateral DVT's #Pulmonary Embolism  -Monitor for S/Sx of bleeding -Trend CBC -Heparin  gtt for AC/VTE Prophylaxis  -Transfuse for Hgb <7  #Acute Metabolic Encephalopathy #Sedation needs in setting of mechanical  ventilation PMHx: Bipolar disorder, schizophrenia, depression -Treatment of metabolic derangements and sepsis as outlined above -Maintain a RASS goal of 0 to -1 -Prn Fentanyl  to maintain RASS goal -Avoid sedating medications as able -Daily wake up assessment      Pt is critically ill with multiorgan failure. Prognosis is guarded, high risk for further decompensation, cardiac arrest, and death.  Given current critical illness superimposed on multiple chronic co-morbidities and advanced age, overall long term prognosis is poor.  Pt is DNR/DNI.  Recommend 1-way extubation once optimized with transition to comfort measures if she should not tolerate extubation..  Palliative Care is following to assist with GOC discussions.   Best Practice (right click and Reselect all SmartList Selections daily)   Diet/type: tubefeeds and NPO DVT prophylaxis: systemic heparin  GI prophylaxis: PPI Lines: Central line and yes and it is still needed Foley:  Yes, and it is still needed Code Status:  DNR Last date of multidisciplinary goals of care discussion [6/23]  6/23: Updated pt's DSS legal guardian Selena Pittman at bedside on plan of care.  Labs   CBC: Recent Labs  Lab 10/05/23 0429 10/06/23 1052 10/07/23 0540 10/08/23 0449 10/09/23 0510 10/10/23 0417 10/10/23 1354 10/11/23 0645  WBC 6.2   < > 11.8* 13.2* 16.8* 10.4  --  7.1  NEUTROABS 5.3  --   --   --   --   --   --   --   HGB 8.0*   < > 7.8* 7.9* 8.0* 6.9* 9.1* 8.6*  HCT 23.4*   < > 23.4* 24.4* 25.2* 21.2* 26.5* 25.6*  MCV 90.3   < > 93.6 98.0 98.4 97.7  --  95.9  PLT 255   < > 229 287 322 254  --  238   < > = values in this interval not displayed.    Basic Metabolic Panel: Recent Labs  Lab 10/06/23 0643 10/06/23 2225 10/07/23 0540 10/07/23 1952 10/08/23 0449 10/09/23 0510 10/10/23 0417 10/11/23 0527  NA 137  --  135 138 135 137 139 138  K 3.2*   < > 4.0 4.8 4.5 4.4 4.1 3.7  CL 106  --  106 107 108 107 105 101  CO2 28  --  24 24  24  27 28 30   GLUCOSE 125*  --  206* 154* 170* 150* 157* 127*  BUN 11  --  13 14 15 16 14 12   CREATININE <0.30*  --  <0.30* <0.30* <0.30* <0.30* <0.30* <0.30*  CALCIUM  7.1*  --  7.3* 7.5* 7.2* 7.6* 7.3* 7.6*  MG 1.9  --  2.0  --  2.0  --  2.1 2.0  PHOS 2.2*   < > 2.9  --  2.2* 2.4* 1.9* 2.2*   < > = values in this interval not displayed.   GFR: CrCl cannot be calculated (This lab value cannot be used to calculate CrCl because it is not a number: <0.30). Recent Labs  Lab 10/04/23 2215 10/05/23 0429 10/07/23 1951 10/08/23 0449 10/09/23 0510 10/10/23 0417 10/11/23 0645  WBC  --    < >  --  13.2* 16.8* 10.4 7.1  LATICACIDVEN 1.3  --  1.6  --   --   --   --    < > = values in this interval not displayed.    Liver Function Tests: Recent Labs  Lab 10/06/23 0643 10/07/23 0540 10/08/23 0449 10/09/23 0510 10/10/23 0417  ALBUMIN  2.3* 2.9* 2.5* 2.4* 2.2*   No results for input(s): LIPASE, AMYLASE in the last 168 hours. No results for input(s): AMMONIA in the last 168 hours.  ABG    Component Value Date/Time   PHART 7.35 10/07/2023 0801   PCO2ART 46 10/07/2023 0801   PO2ART 131 (H) 10/07/2023 0801   HCO3 27.3 10/07/2023 1952   ACIDBASEDEF 0.6 10/07/2023 0801   O2SAT 61 10/07/2023 1952     Coagulation Profile: No results for input(s): INR, PROTIME in the last 168 hours.  Cardiac Enzymes: No results for input(s): CKTOTAL, CKMB, CKMBINDEX, TROPONINI in the last 168 hours.  HbA1C: Hgb A1c MFr Bld  Date/Time Value Ref Range Status  10/04/2023 10:40 AM 5.2 4.8 - 5.6 % Final    Comment:    (NOTE) Diagnosis of Diabetes The following HbA1c ranges recommended by the American Diabetes Association (ADA) may be used as an aid in the diagnosis of diabetes mellitus.  Hemoglobin             Suggested A1C NGSP%              Diagnosis  <5.7                   Non Diabetic  5.7-6.4                Pre-Diabetic  >6.4                   Diabetic  <7.0                    Glycemic control for                       adults with diabetes.      CBG: Recent Labs  Lab 10/10/23 1329 10/10/23 1657 10/10/23 2003 10/10/23 2321 10/11/23 0356  GLUCAP 173* 139* 110* 138* 126*    Review of Systems:   Unable to assess due to intubation/sedation/critical illness   Past Medical History:  She,  has a past medical history of Bipolar affective (HCC), Breast cancer (HCC), Cancer (HCC) (12/11/2014), Cardiomegaly, Depression, Emphysema of lung (HCC), Endometriosis, Hyperlipidemia, Osteoporosis, Schizoaffective disorder (HCC), Schizophrenia (HCC), Severe sepsis (HCC) (04/02/2022), Thyroid  nodule, and Vitamin D  deficiency.   Surgical History:  Past Surgical History:  Procedure Laterality Date   ABDOMINAL HYSTERECTOMY     BREAST BIOPSY Left 12-11-14   INVASIVE MAMMARY CARCINOMA.   BREAST LUMPECTOMY WITH SENTINEL LYMPH NODE BIOPSY Left 12/31/2014   Procedure: LEFT BREAST LUMPECTOMY WITH ULTRASOUND GUIDED NEEDLE LOCALIZATION, SENTINEL LYMPH NODE BIOPSY ;  Surgeon: Louanne KANDICE Muse, MD;  Location: [facility] ORS;  Service: General;  Laterality: Left;   DIAGNOSTIC MAMMOGRAM  12/04/2014   Done at [facility] Imaging Category 5-Left Breast   DILATION AND CURETTAGE OF UTERUS     MASTECTOMY Left 2016   SIMPLE MASTECTOMY WITH AXILLARY SENTINEL NODE BIOPSY Left 01/14/2015   Procedure: SIMPLE MASTECTOMY;  Surgeon: Louanne KANDICE Muse, MD;  Location: [facility] ORS;  Service: General;  Laterality: Left;   TENDON REPAIR Right    hand   TONSILLECTOMY     TUBAL LIGATION       Social History:   reports that she has been smoking cigarettes. She has a 45 pack-year smoking history. She has never used smokeless tobacco. She reports that she does not drink alcohol and does not use drugs.   Family History:  Her family history includes Cancer in her mother; Heart attack in her father. There is no history of Breast cancer.   Allergies Allergies  Allergen Reactions   Cat Dander     Lactose Intolerance (Gi)     Diarrhea    Mixed Ragweed    Peanut-Containing Drug Products      Home Medications  Prior to Admission medications   Medication Sig Start Date End Date Taking? Authorizing Provider  acidophilus (RISAQUAD) CAPS capsule Take 2 capsules by mouth 3 (three) times daily. 08/03/23  Yes Wieting, Richard, MD  alendronate  (FOSAMAX ) 70 MG tablet Take 1 tablet (70 mg total) by mouth once a week. Take with a full glass of water  on an empty stomach. 11/04/22  Yes Awanda City, MD  cetirizine  (ZYRTEC ) 10 MG tablet Take 1 tablet (10 mg total) by mouth daily. 11/04/22  Yes Awanda City, MD  cholecalciferol  (VITAMIN D ) 1000 units tablet Take 2 tablets (2,000 Units total) by mouth daily. 11/04/22  Yes Awanda City, MD  clotrimazole  (LOTRIMIN ) 1 % cream Apply bid to areas of redness 08/03/23  Yes Wieting, Richard, MD  cloZAPine  (CLOZARIL ) 100 MG tablet Take 1 tablet (100 mg total) by mouth 2 (two) times daily. 08/03/23  Yes Wieting, Richard, MD  docusate sodium  (COLACE) 100 MG capsule Take 1 capsule (100 mg total) by mouth 2 (two) times daily. 11/04/22  Yes Awanda City, MD  fluvoxaMINE  (LUVOX ) 50 MG tablet Take 1 tablet (50 mg total) by mouth 2 (two) times daily. 11/04/22  Yes Awanda City, MD  glycerin  adult 2 g suppository Place 1 suppository rectally as needed for constipation. 11/04/22  Yes Awanda City, MD  ketoconazole  (NIZORAL ) 2 % shampoo APPLY TO AFFECTED AREA TWICE A WEEK FOR 8 WEEKS; THEN USE AS NEEDED. 11/04/22  Yes Awanda City, MD  letrozole  (FEMARA ) 2.5 MG tablet Take 1 tablet (2.5 mg total) by mouth daily. 11/04/22  Yes Awanda City, MD  LINZESS  145 MCG CAPS capsule Take 1 capsule (145 mcg total) by mouth daily. 11/04/22  Yes Awanda City, MD  liver oil-zinc  oxide (DESITIN) 40 % ointment Apply topically as needed for irritation. 11/04/22  Yes Awanda City, MD  Melatonin 5 MG CAPS Take 5 mg by mouth at bedtime.   Yes [provider]  midodrine  (PROAMATINE ) 2.5 MG tablet Take 1 tablet (2.5 mg total) by  mouth  3 (three) times daily with meals. 08/03/23  Yes Wieting, Richard, MD  mirtazapine (REMERON) 7.5 MG tablet Take 7.5 mg by mouth at bedtime. 08/31/23  Yes [provider]  montelukast  (SINGULAIR ) 5 MG chewable tablet Chew 1 tablet (5 mg total) by mouth at bedtime. 11/04/22  Yes Awanda City, MD  Multiple Vitamin (MULTIVITAMIN WITH MINERALS) TABS tablet Take 1 tablet by mouth daily. 08/03/23  Yes Wieting, Richard, MD  omeprazole  (PRILOSEC) 20 MG capsule Take 1 capsule (20 mg total) by mouth daily. 11/04/22  Yes Awanda City, MD  ondansetron  (ZOFRAN ) 4 MG tablet Take 4 mg by mouth every 6 (six) hours as needed for nausea or vomiting. 07/22/23  Yes [provider]  polyethylene glycol (MIRALAX  / GLYCOLAX ) 17 g packet Take 17 g by mouth daily as needed for moderate constipation. 08/03/23  Yes Wieting, Richard, MD  thiamine  (VITAMIN B1) 100 MG tablet Take 1 tablet (100 mg total) by mouth daily. 08/03/23  Yes Josette Ade, MD  acetaminophen  (TYLENOL ) 325 MG tablet Take 2 tablets (650 mg total) by mouth every 6 (six) hours as needed for mild pain, fever or headache. Patient not taking: Reported on 09/28/2023 11/04/22   Awanda City, MD  coal tar-salicylic acid  2 % shampoo Use to wash scalp Tuesday, Thursday and Saturdays. Patient not taking: Reported on 09/28/2023 11/04/22   Awanda City, MD  feeding supplement (ENSURE ENLIVE / ENSURE PLUS) LIQD Take 237 mLs by mouth 2 (two) times daily between meals. 08/03/23   Josette Ade, MD  traZODone  (DESYREL ) 50 MG tablet Take 25 mg by mouth at bedtime as needed. Patient not taking: Reported on 09/28/2023 07/26/23   [provider]     Critical care time: 40 minutes     Inge Lecher, AGACNP-BC [facility] Prefer epic messenger for cross cover needs If after hours, please call E-link

## 2023-10-11 NOTE — Progress Notes (Signed)
 PHARMACY CONSULT NOTE  Pharmacy Consult for Electrolyte Monitoring and Replacement   Recent Labs: Potassium (mmol/L)  Date Value  10/11/2023 3.7  12/31/2014 4.0   Magnesium  (mg/dL)  Date Value  93/76/7974 2.0   Calcium  (mg/dL)  Date Value  93/76/7974 7.6 (L)   Albumin  (g/dL)  Date Value  93/77/7974 2.2 (L)   Phosphorus (mg/dL)  Date Value  93/76/7974 2.2 (L)   Sodium (mmol/L)  Date Value  10/11/2023 138   Assessment: 73 y/o female presenting with altered mental status and hypoxia. PMH significant for bipolar disorder, breast cancer, anxiety, depression, hypothyroidism. Pharmacy is asked to follow and replace electrolytes while in CCU  Diuretics: furosemide  40 mg IV x 1  Goal of Therapy:  Electrolytes WNL  Plan:  --40 mEq KCl per tube x 1 --recheck Labs tomorrow  Selena Pittman 10/11/2023 7:01 AM

## 2023-10-11 NOTE — Plan of Care (Signed)
  Problem: Fluid Volume: Goal: Ability to maintain a balanced intake and output will improve Outcome: Progressing   Problem: Clinical Measurements: Goal: Respiratory complications will improve Outcome: Not Progressing   Problem: Pain Managment: Goal: General experience of comfort will improve and/or be controlled Outcome: Not Progressing   Problem: Skin Integrity: Goal: Risk for impaired skin integrity will decrease Outcome: Not Progressing   Problem: Nutrition: Goal: Adequate nutrition will be maintained Outcome: Completed/Met

## 2023-10-11 NOTE — Progress Notes (Signed)
 Palliative Care Progress Note, Assessment & Plan   Patient Name: Selena Pittman       Date: 10/11/2023 DOB: [birth year]  Age: 73 y.o. MRN#: [id number] Attending Physician: Isadora Hose, MD Primary Care Physician: Donal Channing SQUIBB, FNP Admit Date: 09/28/2023  Subjective: Unable to assess  HPI: 73 y.o. female  with past medical history significant for hypotension on midodrine , HLD, left breast cancer, bipolar, anxiety, depression, schizophrenia, schizoaffective disorder, and previous suicide attempt.  She presented to ED via EMS 09/28/2023 from [facility] healthcare complaining of lethargy, hypotension and hypoxia.  EMS reported patient had temp of 99.6 axillary and worsening cough. ED workup showed Na+ 139, K+ 3.0, bicarb 23, BUN 7, creatinine 0.35, GFR > 60, WBC 11.6, Hgb 12.2, platelets 474, troponin 38 => 49.  COVID/flu A-B/RSV negative.  Lactic 1.3. BP 99/67, HR 112, RR 18, SpO2 92% on 2 L O2 and 98.3.   Patient was admitted to [facility] for management of severe sepsis 2/2 PNA, pyuria, multilobar lung infiltrate, acute PE and severe protein calorie malnutrition.   PMT was consulted for assistance with goals of care discussion.   Patient status continued to worsen leading to rapid response being called 10/02/2023 requiring emergent intubation and transfer to ICU.  PMT was unable to complete consult due to this event.   Dr. Malka had long discussion with family at the bedside including CODE STATUS and prognosis 10/02/2023.  [name] (son) at that time reports he was not comfortable making patient a DNR and wanted to exhaust all options.   6/18 Patient extubated   6/19 Deteriorated overnight requiring re-intubation   6/20 DSS guardian, [name], SW, assessed patient at bedside with CCM who recommended  DNR/DNI with one-way extubation if patient fails to respond to medical treatment. DSS guardian to advise after she speaks with her supervisor regarding plan of care.  6/22 Failed SBT and wake up assessment.  After speaking with DSS legal guardian, decision was made to change CODE STATUS to DNR/DNI.  6/23 On minimal vent support, weaned off pressors and sedation discontinued.  Mental status currently precluding extubation.  Summary of counseling/coordination of care: Extensive chart review completed prior to meeting patient including labs, vital signs, imaging, progress notes, orders, and available advanced directive documents from current and previous encounters.   After reviewing the patient's chart and assessing the patient at bedside, I spoke with patient's sister at bedside in regards to symptom management and goals of care.   Elderly, ill-appearing female lying in bed.  She is intubated but not requiring sedation.  She is no longer on pressor support.  NG tube with tube feeds.  She is in no distress.  Patient's sister Selena Pittman at bedside during visit.  Discussed patient's current critical status along with patient's primary RN with patient's Sister Selena at bedside.  Selena states she understands that her sister is critically ill and has extremely poor prognosis.  She shares that patient's son and sister have excepted that patient will most likely not get better.  Selena continues to state her sister would not want to live this way.  She expresses concern for pain or discomfort.  She was reassured if patient appears agitated or vital signs  reflect discomfort she will receive IV pain medication.  Selena expresses appreciation for the care her sister has received.  Therapeutic silence and active listening provided for patient's sister to share her thoughts and emotions regarding current medical situation.  Emotional support provided.  Physical Exam Vitals reviewed.   Constitutional:      General: She is not in acute distress.    Appearance: She is ill-appearing.  HENT:     Head: Normocephalic and atraumatic.     Nose:     Comments: NG in place Pulmonary:     Effort: No respiratory distress.     Comments: Mechanically ventilated  Musculoskeletal:     Comments: Patient has edema to bilateral upper and lower extremities   Skin:    General: Skin is warm and dry.    Recommendations/Plan: DNR/DNI per [name], DSS Continue current supportive interventions Disposition TBD PMT will continue to follow                Total Time 50 minutes   Plan of care discussed with primary RN.  Time spent includes: Detailed review of medical records (labs, imaging, vital signs), medically appropriate exam (mental status, respiratory, cardiac, skin), discussed with treatment team, counseling and educating patient, family and staff, documenting clinical information, medication management and coordination of care.     Devere Sacks, ELNITA- [facility] Palliative Medicine Team  10/11/2023 8:20 AM  Office [phone]  Pager [phone]

## 2023-10-11 NOTE — Progress Notes (Signed)
 PHARMACY - ANTICOAGULATION CONSULT NOTE  Pharmacy Consult for IV Heparin  Indication: pulmonary embolus  Patient Measurements: Height: 5' 5 (165.1 cm) Weight: 64.1 kg (141 lb 5 oz) IBW/kg (Calculated) : 57 HEPARIN  DW (KG): 64  Labs: Recent Labs    10/09/23 0510 10/10/23 0417 10/10/23 1354 10/11/23 0527  HGB 8.0* 6.9* 9.1*  --   HCT 25.2* 21.2* 26.5*  --   PLT 322 254  --   --   HEPARINUNFRC 0.62 0.55  --  0.41  CREATININE <0.30* <0.30*  --   --     CrCl cannot be calculated (This lab value cannot be used to calculate CrCl because it is not a number: <0.30).   Medical History: Past Medical History:  Diagnosis Date   Bipolar affective (HCC)    Breast cancer (HCC)    Cancer (HCC) 12/11/2014   INVASIVE MAMMARY CARCINOMA/.left breast/ T2 N0   Cardiomegaly    Depression    Emphysema of lung (HCC)    Endometriosis    Hyperlipidemia    Osteoporosis    Schizoaffective disorder (HCC)    Schizophrenia (HCC)    Severe sepsis (HCC) 04/02/2022   Thyroid  nodule    Vitamin D  deficiency    Assessment: 73 y/o female presenting with altered mental status and hypoxia. PMH significant for bipolar disorder, breast cancer, anxiety, depression, hypothyroidism. CT imaging shows pulmonary embolism.Per chart review, patient is not on anticoagulation prior to admission. Pharmacy has been consulted to re- initiate and manage heparin  infusion.  Patient started Xarelto  (last dose 06/14 0825). Now transitioned back to heparin  drip.  Goal of Therapy:  Heparin  level 0.3-0.7 units/ml aPTT 66 - 102s Monitor platelets by anticoagulation protocol: Yes  06/16 0846 HL 0.13, aPTT 67s, subthera; 650 un/hr 06/16 2023 HL 0.24, subthera; 750 un/hr 06/17 0429 HL 0.31, therapeutic x 1 06/17 1244 HL 0.28, subthera; 850 un/hr 06/17 2252 HL 0.34, therapeutic x 1 06/18 0643 HL 0.40, therapeutic x 2 06/19 0540 HL 0.37, therapeutic X 3  06/20 0449 HL 0.53, therapeutic X 4  06/21 0510 HL 0.62, therapeutic  X 5  06/22 0417 HL 0.55, therapeutic X 6 06/23 0527 HL 0.41, therapeutic X 7 ------------------------------------------------ 06/22 2043 Heparin  drip restarted  Plan:  6/23:  HL @ 0527 = 0.41, therapeutic X 7 - continue pt on current rate and recheck HL on 6/24 with AM labs.  --CBC daily  Thank you for involving pharmacy in this patient's care.   Allen Basista D Clinical Pharmacist 10/11/2023 6:09 AM

## 2023-10-12 DIAGNOSIS — A419 Sepsis, unspecified organism: Secondary | ICD-10-CM | POA: Diagnosis not present

## 2023-10-12 DIAGNOSIS — Z9911 Dependence on respirator [ventilator] status: Secondary | ICD-10-CM

## 2023-10-12 DIAGNOSIS — Z515 Encounter for palliative care: Secondary | ICD-10-CM | POA: Diagnosis not present

## 2023-10-12 DIAGNOSIS — G928 Other toxic encephalopathy: Secondary | ICD-10-CM

## 2023-10-12 DIAGNOSIS — J189 Pneumonia, unspecified organism: Secondary | ICD-10-CM | POA: Diagnosis not present

## 2023-10-12 DIAGNOSIS — J690 Pneumonitis due to inhalation of food and vomit: Secondary | ICD-10-CM

## 2023-10-12 DIAGNOSIS — A4101 Sepsis due to Methicillin susceptible Staphylococcus aureus: Secondary | ICD-10-CM | POA: Diagnosis not present

## 2023-10-12 DIAGNOSIS — J9601 Acute respiratory failure with hypoxia: Secondary | ICD-10-CM | POA: Diagnosis not present

## 2023-10-12 DIAGNOSIS — Z7189 Other specified counseling: Secondary | ICD-10-CM | POA: Diagnosis not present

## 2023-10-12 DIAGNOSIS — I82413 Acute embolism and thrombosis of femoral vein, bilateral: Secondary | ICD-10-CM | POA: Diagnosis not present

## 2023-10-12 DIAGNOSIS — R531 Weakness: Secondary | ICD-10-CM | POA: Diagnosis not present

## 2023-10-12 LAB — CBC WITH DIFFERENTIAL/PLATELET
Abs Immature Granulocytes: 0.6 10*3/uL — ABNORMAL HIGH (ref 0.00–0.07)
Basophils Absolute: 0 10*3/uL (ref 0.0–0.1)
Basophils Relative: 0 %
Eosinophils Absolute: 0 10*3/uL (ref 0.0–0.5)
Eosinophils Relative: 0 %
HCT: 30 % — ABNORMAL LOW (ref 36.0–46.0)
Hemoglobin: 9.7 g/dL — ABNORMAL LOW (ref 12.0–15.0)
Immature Granulocytes: 8 %
Lymphocytes Relative: 6 %
Lymphs Abs: 0.4 10*3/uL — ABNORMAL LOW (ref 0.7–4.0)
MCH: 32 pg (ref 26.0–34.0)
MCHC: 32.3 g/dL (ref 30.0–36.0)
MCV: 99 fL (ref 80.0–100.0)
Monocytes Absolute: 0.4 10*3/uL (ref 0.1–1.0)
Monocytes Relative: 6 %
Neutro Abs: 5.7 10*3/uL (ref 1.7–7.7)
Neutrophils Relative %: 80 %
Platelets: 242 10*3/uL (ref 150–400)
RBC: 3.03 MIL/uL — ABNORMAL LOW (ref 3.87–5.11)
RDW: 17.1 % — ABNORMAL HIGH (ref 11.5–15.5)
Smear Review: NORMAL
WBC: 7.2 10*3/uL (ref 4.0–10.5)
nRBC: 0.4 % — ABNORMAL HIGH (ref 0.0–0.2)

## 2023-10-12 LAB — BASIC METABOLIC PANEL WITH GFR
Anion gap: 8 (ref 5–15)
BUN: 13 mg/dL (ref 8–23)
CO2: 34 mmol/L — ABNORMAL HIGH (ref 22–32)
Calcium: 7.8 mg/dL — ABNORMAL LOW (ref 8.9–10.3)
Chloride: 109 mmol/L (ref 98–111)
Creatinine, Ser: <0.3 mg/dL — ABNORMAL LOW (ref 0.44–1.00)
Glucose, Bld: 133 mg/dL — ABNORMAL HIGH (ref 70–99)
Potassium: 3.7 mmol/L (ref 3.5–5.1)
Sodium: 148 mmol/L — ABNORMAL HIGH (ref 135–145)

## 2023-10-12 LAB — GLUCOSE, CAPILLARY
Glucose-Capillary: 130 mg/dL — ABNORMAL HIGH (ref 70–99)
Glucose-Capillary: 132 mg/dL — ABNORMAL HIGH (ref 70–99)
Glucose-Capillary: 111 mg/dL — ABNORMAL HIGH (ref 70–99)
Glucose-Capillary: 122 mg/dL — ABNORMAL HIGH (ref 70–99)
Glucose-Capillary: 125 mg/dL — ABNORMAL HIGH (ref 70–99)
Glucose-Capillary: 127 mg/dL — ABNORMAL HIGH (ref 70–99)

## 2023-10-12 LAB — MAGNESIUM: Magnesium: 2.2 mg/dL (ref 1.7–2.4)

## 2023-10-12 LAB — HEPARIN LEVEL (UNFRACTIONATED): Heparin Unfractionated: 0.53 [IU]/mL (ref 0.30–0.70)

## 2023-10-12 LAB — PHOSPHORUS: Phosphorus: 2.8 mg/dL (ref 2.5–4.6)

## 2023-10-12 MED ORDER — HYDROCODONE-ACETAMINOPHEN 5-325 MG PO TABS
1.0000 | ORAL_TABLET | Freq: Four times a day (QID) | ORAL | Status: DC | PRN
  Administered 2023-10-12: 1
  Administered 2023-10-13 (×2): 2
  Administered 2023-10-13: 1
  Filled 2023-10-12 (×2): qty 2
  Filled 2023-10-12 (×2): qty 1

## 2023-10-12 MED ORDER — FENTANYL CITRATE PF 50 MCG/ML IJ SOSY
12.5000 ug | PREFILLED_SYRINGE | INTRAMUSCULAR | Status: DC | PRN
  Administered 2023-10-13: 12.5 ug via INTRAVENOUS
  Filled 2023-10-12: qty 1

## 2023-10-12 MED ORDER — IPRATROPIUM-ALBUTEROL 0.5-2.5 (3) MG/3ML IN SOLN
3.0000 mL | Freq: Four times a day (QID) | RESPIRATORY_TRACT | Status: DC
  Administered 2023-10-12 – 2023-10-14 (×8): 3 mL via RESPIRATORY_TRACT
  Filled 2023-10-12 (×8): qty 3

## 2023-10-12 MED ORDER — ACETAMINOPHEN 10 MG/ML IV SOLN
1000.0000 mg | Freq: Once | INTRAVENOUS | Status: AC
  Administered 2023-10-12: 1000 mg via INTRAVENOUS
  Filled 2023-10-12: qty 100

## 2023-10-12 MED ORDER — HYDROCORTISONE SOD SUC (PF) 100 MG IJ SOLR
50.0000 mg | Freq: Two times a day (BID) | INTRAMUSCULAR | Status: DC
  Administered 2023-10-12 – 2023-10-13 (×3): 50 mg via INTRAVENOUS
  Filled 2023-10-12 (×3): qty 2

## 2023-10-12 MED ORDER — ORAL CARE MOUTH RINSE
15.0000 mL | OROMUCOSAL | Status: DC
  Administered 2023-10-12 – 2023-10-14 (×10): 15 mL via OROMUCOSAL

## 2023-10-12 MED ORDER — FREE WATER
100.0000 mL | Status: DC
  Administered 2023-10-12 – 2023-10-14 (×12): 100 mL

## 2023-10-12 NOTE — Progress Notes (Signed)
 Patient's oxygen saturations 78% on 2L and HR in 140s on monitor. Went into patient's room to check on patient and patient red in face, tachypneic, and labored breathing. Nasal cannula in nose and oxygen sensor securely on warm and dry finger with a good pleth and heart rate correlation. Oxygen increased to 6 L nasal cannula, respiratory therapy called, and patient placed on HFNC at 14 L. Patient's oxygen saturations increased to 80% on 14 L. Heart rate decreasing, work of breathing decreasing, and patient's color better. Lungs with rhonchi and coarse crackles on right and very, very diminished on left. RT at bedside, assessed patient, placed patient in chest PT on bed, and got ICU MD to bedside. Discussed patient condition and plan to place patient on heated high flow nasal cannula.

## 2023-10-12 NOTE — Progress Notes (Signed)
 Daily Progress Note   Date: 10/12/2023   Patient Name: Selena Pittman  DOB: [birth year]  MRN: [id number]  Age / Sex: 73 y.o., female  Attending Physician: Isadora Hose, MD Primary Care Physician: Donal Channing SQUIBB, FNP Admit Date: 09/28/2023 Length of Stay: 14 days  Reason for Consultation: Establishing goals of care  Past Medical History:  Diagnosis Date   Bipolar affective (HCC)    Breast cancer (HCC)    Cancer (HCC) 12/11/2014   INVASIVE MAMMARY CARCINOMA/.left breast/ T2 N0   Cardiomegaly    Depression    Emphysema of lung (HCC)    Endometriosis    Hyperlipidemia    Osteoporosis    Schizoaffective disorder (HCC)    Schizophrenia (HCC)    Severe sepsis (HCC) 04/02/2022   Thyroid  nodule    Vitamin D  deficiency     Subjective:   Subjective: Chart Reviewed. Updates received. Patient Assessed. Created space and opportunity for patient  and family to explore thoughts and feelings regarding current medical situation.  Today's Discussion: Today before meeting with the patient/family, I reviewed the chart including palliative medicine note from yesterday, PCCM note from yesterday, TOC note from yesterday, PCCM note from this morning, respiratory therapy note from this morning including extubation noted.  In summary the patient was admitted and deteriorated from a respiratory standpoint requiring intubation and transferred to the ICU, was extubated, deteriorated again requiring reintubation.  Son transferred guardianship to DSS who agreed to DNR/DNI but continue current care.  She was on minimal vent support yesterday, off pressors and sedation.  Her mental status cleared off sedation and she was extubated this morning.  I reviewed labs including CBC showing stable/normal white count at 7.2, no significant anemia with hemoglobin stable at 9.7.  She did have a bump in her sodium to 148 today, normal kidney function with a BUN/creatinine of 13/0.30.  Today went to the bedside and  saw the patient.  She was quite sleepy, did answer a couple questions but only with weak voice and head nods.  She seems to be a bit somnolent.  She does have some gurgling and I worry about secretions.  She denies pain, is able to voice/mouth her name.  Is unable to tell me where she is at or elaborate on clinical details.  I spoke with the bedside nurse about plan for today.  States she has been stable, but they are keeping a close eye on her secretions and she is at high risk for aspiration.  We discussed and agreed that she is DNR/DNI and so there will not be a reintubation.  I shared that palliative medicine will come follow closely for any deterioration and engage with DSS for decisions as needed.  Later in the day I received a message from [facility] nurse practitioner Inge Lecher, NP but the patient is having respiratory compromise.  Her saturations dropped into the 70s because of secretions and likely aspirating.  She has been placed on increasing oxygen including up to 15 L heated high flow nasal cannula.  The ICU team has been trying to reach DSS for discussions but been unsuccessful and requested my assistance.  I reached out to DSS guardian listed in the chart Selena Pittman.  Initially was leaving a message regarding quick turnaround callback.  I discussed the clinical situation of ongoing respiratory deterioration likely from recurrent aspiration and difficulty handling secretions.  She is requiring increasing levels of oxygen.  This point, family yesterday said that they do not anticipate she would  get better and sister shared that this is not a life that she would want.  Because of all this information I shared the medical team's recommendation to consider transition to comfort care to allow the patient to pass peacefully rather than uncomfortably.  I shared concerns the patient is starting to suffer as evident by desaturations and increased work of breathing.  DSS guardian shares that she will  pass this information on to her supervisor for discussion and decisions.  I provided my personal cell phone number for call back for any questions or forward discussion for decisions.  Later today I had not heard back yet and reached out to the ICU team.  I shared that if I get a call back after hours I would refer them to the ICU number at [phone] should DSS make any decisions such as transition to comfort care while palliative medicine is off service.  I shared that I would be back tomorrow to check on the patient and continue attempts to follow-up with DSS.  I provided emotional and general support through therapeutic listening, empathy, sharing of stories, and other techniques. I answered all questions and addressed all concerns to the best of my ability.  ROS Limited due to Mental Status Review of Systems  Constitutional:        Denies pain in general    Objective:   Primary Diagnoses: Present on Admission:  Sepsis due to pneumonia (HCC)  Tobacco use  Schizophrenia (HCC)  Hyperlipidemia  GERD (gastroesophageal reflux disease)  Bipolar disorder (HCC)  Hypokalemia  Leukocytosis  Protein-calorie malnutrition, severe (HCC)   Vital Signs:  BP 118/88   Pulse (!) 114   Temp 97.7 F (36.5 C) (Oral)   Resp 18   Ht 5' 5 (1.651 m)   Wt 60.6 kg   SpO2 97%   BMI 22.23 kg/m   Physical Exam Vitals and nursing note reviewed.  Constitutional:      General: She is not in acute distress.    Appearance: She is ill-appearing.     Comments: Appears very frail and weak   Cardiovascular:     Rate and Rhythm: Tachycardia present.  Pulmonary:     Effort: Pulmonary effort is normal. No respiratory distress.     Comments: Noted significant secretions, attempted to suction Abdominal:     General: Abdomen is flat.     Palpations: Abdomen is soft.   Neurological:     Mental Status: She is lethargic, disoriented and confused.     Comments: Not answering very many questions   Psychiatric:        Mood and Affect: Mood normal.        Behavior: Behavior normal.     Palliative Assessment/Data: 10%   Existing Vynca/ACP Documentation: None  Advanced Care Planning:   Existing Vynca/ACP Documentation: None  Primary Decision Maker: LEGAL GUARDIAN  Pertinent diagnosis: Severe respiratory failure, severe sepsis due to pneumonia, acute PE  The patient and/or family consented to a voluntary Advance Care Planning Conversation over the phone. Individuals present for the conversation: DSS guardian Selena Pittman  Summary of the conversation: Discussed the patient's significant respiratory decline today after extubation despite significant medical attempts to stabilize her including increasing oxygen needs.  Discussed DNR/DNI status and no plans for reintubation.  Discussed medical team recommendation to transition to comfort care.  Outcome of the conversations and/or documents completed: DSS guardian to discuss with supervisor for possible decision making, offered my personal phone number for call back.  Will await DSS decisions.  Will continue to engage.  I spent 16 minutes providing separately identifiable ACP services with the patient and/or surrogate decision maker in a voluntary, in-person conversation discussing the patient's wishes and goals as detailed in the above note.  Assessment & Plan:   HPI/Patient Profile:  73 y.o. female  with past medical history significant for hypotension on midodrine , HLD, left breast cancer, bipolar, anxiety, depression, schizophrenia, schizoaffective disorder, and previous suicide attempt.  She presented to ED via EMS 09/28/2023 from [facility] complaining of lethargy, hypotension and hypoxia.  EMS reported patient had temp of 99.6 axillary and worsening cough. ED workup showed Na+ 139, K+ 3.0, bicarb 23, BUN 7, creatinine 0.35, GFR > 60, WBC 11.6, Hgb 12.2, platelets 474, troponin 38 => 49.  COVID/flu A-B/RSV negative.   Lactic 1.3. BP 99/67, HR 112, RR 18, SpO2 92% on 2 L O2 and 98.3.   Patient was admitted to [facility] for management of severe sepsis 2/2 PNA, pyuria, multilobar lung infiltrate, acute PE and severe protein calorie malnutrition.   PMT was consulted for assistance with goals of care discussion.  10/02/2023 required emergent intubation and transfer to ICU.   6/18 Patient extubated  6/19 Deteriorated overnight requiring re-intubation  6/20 DSS guardian, Selena Pittman, SW, assessed patient at bedside with CCM who recommended DNR/DNI with one-way extubation if patient fails to respond to medical treatment. DSS guardian to advise after she speaks with her supervisor regarding plan of care.  6/22 Failed SBT and wake up assessment.  After speaking with DSS legal guardian, decision was made to change CODE STATUS to DNR/DNI.  6/23 On minimal vent support, weaned off pressors and sedation discontinued.  Mental status currently precluding extubation. 6/24 extubated but having respiratory decline a few hours later, s/w DSS with recommendation for transition to comfort care, awaiting DSS decisions  SUMMARY OF RECOMMENDATIONS   DNR-limited Continue current scope of care No plans for reintubation Will await decisions from DSS, transition to comfort care Palliative medicine will continue to follow  Symptom Management:  Per primary team PMD is available to assist as needed  Code Status: DNR-limited  Prognosis: Hours - Days  Discharge Planning: Anticipated Hospital Death  Discussed with: Patient, medical team, nursing team, DSS guardian  Thank you for allowing us  to participate in the care of Selena Pittman PMT will continue to support holistically.  Billing based on MDM: High  Problems Addressed: One acute or chronic illness or injury that poses a threat to life or bodily function  Amount and/or Complexity of Data: Category 1:Review of prior external note(s) from each Selena source, Review of the  result(s) of each Selena test, and Assessment requiring an independent historian(s) and Category 3:Discussion of management or test interpretation with external physician/other qualified health care professional/appropriate source (not separately reported)  Risks: N/A  Detailed review of medical records (labs, imaging, vital signs), medically appropriate exam, discussed with treatment team, counseling and education to patient, family, & staff, documenting clinical information, medication management, coordination of care  Camellia Kays, NP Palliative Medicine Team  Team Phone # [phone] (Nights/Weekends)  12/17/2020, 8:17 AM

## 2023-10-12 NOTE — Progress Notes (Signed)
 NAME:  Selena Pittman, MRN:  [id number], DOB:  Jul 28, 1950, LOS: 14 ADMISSION DATE:  09/28/2023, CONSULTATION DATE:  10/02/2023 REFERRING MD:  Dr. Caleen, CHIEF COMPLAINT:  Hypotension, hypoxia    Brief Pt Description / Synopsis:  73 y.o. female admitted with Acute Hypoxic Respiratory Failure and Severe Sepsis with Septic Shock due to MSSA Pneumonia, Rhinovirus/Parainfluenza viral pneumonia, and ESBL E. Coli UTI, along with Bilateral DVT's and PE, and Acute Metabolic Encephalopathy requiring intubation and mechanical ventilation.    History of Present Illness:   Selena Pittman is a 73 year old female with history of hypertension, on midodrine , GERD, insomnia, left breast cancer, anxiety, depression, hypothyroid, bipolar, who presents ED for chief concerns of hypotension and hypoxia at [facility] house.   Vitals in the ED showed t 98.3, respiration rate of 18, heart rate 112, blood pressure 83/64, improved to 99/67, SpO2 of 92% on 2 L nasal cannula.   Serum sodium is 139, potassium 3.0, chloride one 0.6, bicarb 23, BUN of 7, serum creatinine 0.35, EGFR greater than 60, nonfasting blood glucose 99, WBC 11.6, hemoglobin 12.2, platelets of 474.HS troponin is 38, on repeat is 49. COVID/influenza A/influenza B/RSV PCR were negative.Lactic acid is 1.3. Chest x-ray with concern of atypical pneumonia. CTA chest with small bilateral PE and bilateral infiltrate concerning for pneumonia CT head with no acute abnormality, age-related atrophy and moderate paranasal sinus disease.  CT abdomen and pelvis with concern of enterocolitis and fecal impaction in rectum.   ED treatment: Heparin  bolus and gtt., vancomycin , cefepime , sodium chloride  1.5 liter bolus, midodrine  2.5 mg p.o. one-time dose.  Please see Significant Hospital Events section below for full detailed hospital course.   Pertinent  Medical History   Past Medical History:  Diagnosis Date   Bipolar affective (HCC)    Breast cancer (HCC)     Cancer (HCC) 12/11/2014   INVASIVE MAMMARY CARCINOMA/.left breast/ T2 N0   Cardiomegaly    Depression    Emphysema of lung (HCC)    Endometriosis    Hyperlipidemia    Osteoporosis    Schizoaffective disorder (HCC)    Schizophrenia (HCC)    Severe sepsis (HCC) 04/02/2022   Thyroid  nodule    Vitamin D  deficiency     Micro Data:  6/10: SARS-CoV-2/flu/RSV PCR>> negative 6/10: Blood culture x 2>> no growth 6/10: Urine>> ESBL E. Coli 6/11: MRSA PCR + 6/12: RVP>> + rhinovirus & parainfluenza virus 6/14: Tracheal aspirate>> MSSA 6/15: Strep pneumo and Legionella urine antigens>> negative 6/15: Repeat blood cultures>> no growth 6/16: BAL>> MSSA  Antimicrobials:   Anti-infectives (From admission, onward)    Start     Dose/Rate Route Frequency Ordered Stop   10/08/23 0600  ceFAZolin  (ANCEF ) IVPB 2g/100 mL premix        2 g 200 mL/hr over 30 Minutes Intravenous Every 8 hours 10/07/23 1045 10/09/23 2211   10/05/23 1100  linezolid  (ZYVOX ) IVPB 600 mg  Status:  Discontinued        600 mg 300 mL/hr over 60 Minutes Intravenous Every 12 hours 10/05/23 1013 10/07/23 1044   10/03/23 1600  cefTRIAXone  (ROCEPHIN ) 2 g in sodium chloride  0.9 % 100 mL IVPB  Status:  Discontinued        2 g 200 mL/hr over 30 Minutes Intravenous Every 24 hours 10/03/23 0607 10/03/23 0801   10/03/23 1500  vancomycin  (VANCOREADY) IVPB 500 mg/100 mL  Status:  Discontinued        500 mg 100 mL/hr over 60 Minutes Intravenous Every 24 hours  10/03/23 0623 10/04/23 1011   10/03/23 1000  meropenem  (MERREM ) 1 g in sodium chloride  0.9 % 100 mL IVPB        1 g 200 mL/hr over 30 Minutes Intravenous Every 8 hours 10/03/23 0801 10/07/23 1802   10/01/23 1800  azithromycin  (ZITHROMAX ) tablet 500 mg  Status:  Discontinued        500 mg Oral Daily-1800 09/30/23 0956 10/03/23 0808   10/01/23 1000  fosfomycin (MONUROL ) packet 3 g        3 g Oral  Once 10/01/23 0807 10/01/23 0956   09/30/23 1200  fosfomycin (MONUROL ) packet 3 g   Status:  Discontinued        3 g Oral  Once 09/30/23 1025 10/01/23 0807   09/29/23 1700  cefTRIAXone  (ROCEPHIN ) 2 g in sodium chloride  0.9 % 100 mL IVPB        2 g 200 mL/hr over 30 Minutes Intravenous Every 24 hours 09/28/23 1703 10/02/23 1740   09/29/23 1400  vancomycin  (VANCOREADY) IVPB 500 mg/100 mL        500 mg 100 mL/hr over 60 Minutes Intravenous Every 24 hours 09/29/23 1007 10/02/23 1604   09/28/23 1715  azithromycin  (ZITHROMAX ) 500 mg in sodium chloride  0.9 % 250 mL IVPB        500 mg 250 mL/hr over 60 Minutes Intravenous Every 24 hours 09/28/23 1703 09/30/23 1841   09/28/23 1200  ceFEPIme  (MAXIPIME ) 2 g in sodium chloride  0.9 % 100 mL IVPB        2 g 200 mL/hr over 30 Minutes Intravenous  Once 09/28/23 1148 09/28/23 1226   09/28/23 1200  metroNIDAZOLE  (FLAGYL ) IVPB 500 mg        500 mg 100 mL/hr over 60 Minutes Intravenous  Once 09/28/23 1148 09/28/23 1354   09/28/23 1200  vancomycin  (VANCOCIN ) IVPB 1000 mg/200 mL premix        1,000 mg 200 mL/hr over 60 Minutes Intravenous  Once 09/28/23 1148 09/28/23 1455       Significant Hospital Events: Including procedures, antibiotic start and stop dates in addition to other pertinent events   6/11: Vital stable, preliminary blood cultures negative.  MRI brain with no acute intracranial abnormality.  Mucosal thickening with air-fluid level in left sphenoid and maxillary sinuses more consistent with acute sinusitis. MRSA PCR positive so adding vancomycin . Lower extremity venous Doppler with extensive bilateral DVT, right greater than left.  Echocardiogram was normal. Vascular surgery was consulted to see if she is a candidate for any intervention or IVC filter placement.   6/12: Patient was little more lethargic this morning so ABG was done, it shows pH of 7.51, CO2 30 and mild hypoxia with pO2 of 65.  Placed on 2 L of oxygen.  Later she did woke up.  Procalcitonin negative so checking respiratory viral panel as he might be having viral  pneumonia.  Urine cultures with ESBL E. coli-ordered 1 dose of fosfomycin. based on her debility-vascular evaluated her and she will not be a candidate for any intervention.  Heparin  is being switched with Xarelto . Chest PT ordered as she remained quite congested Mild hypokalemia and hypomagnesemia-replating electrolytes 6/13: Patient remained quite congested.  Respiratory viral panel positive for rhino and parainfluenza virus.  Mild hypophosphatemia today which is being repleted. 06/14:  Patient admitted to the ICU, intubated and bronched. Sputum sent for cultutre. 10/03/23: WBC trending up. ESBL in urine culture. Severe hypokalemia. Persistent shock.  10/04/23- patient remains on MV, hyperglycemic this am.  Has increased resp secretions which are inspissated.  She remains on levophed  and vasopressin  infusion.  She is heparin  infusion.  She is at risk of re-feeding syndrome due to moderate protein calorie malnutrition.  She had multiple bowel movements overnight.  She is currently being treated for multi drug resistant UTI with viral LRTI due to parainfulenza and rhinovirus.   Palliative care is following.  10/05/23- ESBL, MRSA, parainfluenza 3 and rhinoevirus positive, pharmcy consultant recommendation for zyvox  initiation, CVP monitoring, sedation req is high we are changing to ketamine  from prop due to shock physiology.  She has sacral decubitus stage 3 present on admission.  10/06/23- Patient had no overnight events, continuing wake up asessment, reduced vasopressors by 80% plan to extubate when able. Wound care following. CVP 6 this am.  10/07/23- patient deteriorated overnight with requirement of re-intubation after discussion with her son.  She at Rass negative 1.  Prognosis is poor, palliative care is following. Continuing to treat MSSA CAP and parainfluenza infection.  She also had ecoli UTI and has completed therapy for this.  10/08/23- daughter and granddaughter came in to see patient.  State appointed  POA is working with us  on goals of care.  10/09/23- patient continues to be critically ill.  She remains wit poor prognosis and I anticipate possible hospital death 2023-10-31- patient remains critically ill, she did SBT and wake up asessment today which she failed. Family including son and sister were at bedside and we reivewed medical plan and hospital course.  We spoke with state appointed POA today.  Her code status is now DNR/DNI 10/11/23: No significant events noted overnight, afebrile, hemodynamically stable, vasopressors weaned off. Diurese with 40 mg IV Lasix  x1 dose.  On minimal vent support, will exercise and PSV as tolerated.  Mental status currently precluding extubation. 10/12/23: No significant events overnight, afebrile, no vasopressors.  Off sedation, awake and alert following simple commands (very weak and deconditioned). On minimal vent support, plan for SBT as tolerated, hopeful for 1 way extubation (Dr. Isadora confirmed DNR/DNI with DSS guardian).  Hold diuresis today due to mild Hypernatremia, Free water  flushes increased.  Interim History / Subjective:  As outlined above under Significant Hospital Events section  Objective   Blood pressure 100/78, pulse (!) 111, temperature 97.7 F (36.5 C), temperature source Axillary, resp. rate 16, height 5' 5 (1.651 m), weight 60.6 kg, SpO2 100%.    Vent Mode: PRVC FiO2 (%):  [30 %-40 %] 30 % Set Rate:  [20 bmp] 20 bmp Vt Set:  [350 mL] 350 mL PEEP:  [5 cmH20] 5 cmH20 Pressure Support:  [5 cmH20] 5 cmH20 Plateau Pressure:  [13 cmH20] 13 cmH20   Intake/Output Summary (Last 24 hours) at 10/12/2023 0719 Last data filed at 10/12/2023 0500 Gross per 24 hour  Intake 1014.02 ml  Output 4610 ml  Net -3595.98 ml   Filed Weights   10-31-2023 1900 10/11/23 0500 10/12/23 0315  Weight: 64 kg 64.1 kg 60.6 kg    Examination: General: Critically ill on chronically ill-appearing frail elderly female, laying in bed, intubated, off sedation, no  acute distress HENT: Atraumatic, normocephalic, neck supple, no JVD Lungs: Coarse breath sounds throughout, even, nonlabored, occasionally overbreathing the vent Cardiovascular: Tachycardia, regular rhythm, S1-S2, no murmurs, rubs, gallops Abdomen: Soft, nontender, nondistended, no guarding or rebound tenderness, bowel sounds positive x 4 Extremities: Normal bulk and tone, 3+ pitting edema bilateral lower extremities and anasarca Neuro: Off sedation, awake and alert, following simple commands, very weak and deconditioned, no  focal deficits noted, pupils PERRLA GU: Foley catheter in place draining yellow urine  Resolved Hospital Problem list   Septic Shock Severe Sepsis MSSA Pneumonia Rhinovirus/Parainfluenza Viral Pneumonia  ESBL E. Coli UTI   Assessment & Plan:   #Acute Hypoxic Respiratory Failure due to ... #MSSA Pneumonia & Rhinovirus/Parainfluenza Viral Pneumonia #Pulmonary Embolism Failed trial of extubation on 6/18 requiring reintubation on 6/19 -CTa Chest on 6/10 with small pulmonary embolisms to RLL and LLL (no evidence of ventricular dysfunctions) and pneumonia -Full vent support, implement lung protective strategies -Plateau pressures less than 30 cm H20 -Wean FiO2 & PEEP as tolerated to maintain O2 sats >92% -Follow intermittent Chest X-ray & ABG as needed -Spontaneous Breathing Trials when respiratory parameters met and mental status permits -Implement VAP Bundle -Bronchodilators -Completed ABX as above -Continue Heparin  gtt -Diuresis as BP and renal function permits ~ hold diuresis 6/24 due to hypernatremia  #Shock: Septic ~ RESOLVED #Mildly Elevated Troponin due to demand ischemia  PMHx: HTN, HLD -Echocardiogram 09/29/23: LVEF 60-65%, normal diastolic parameters, RV systolic function not well visualized, RV size normal -Continuous cardiac monitoring -Maintain MAP >65 -Vasopressors as needed to maintain MAP goal ~ weaned off -Continue Midodrine  5 mg TID -Lactic  acid has normalized -HS Troponin peaked at 49  #Severe Sepsis due to ... #MSSA Pneumonia ~ TREATED #Rhinovirus/Parainfluenza Viral Pneumonia ~ TREATED #ESBL E. Coli UTI ~ TREATED -Monitor fever curve -Trend WBC's  -Follow cultures as above -Completed course of ABX as above  #Bilateral DVT's #Pulmonary Embolism  -Monitor for S/Sx of bleeding -Trend CBC -Heparin  gtt for AC/VTE Prophylaxis  -Transfuse for Hgb <7  #Acute Metabolic Encephalopathy #Sedation needs in setting of mechanical ventilation PMHx: Bipolar disorder, schizophrenia, depression -Treatment of metabolic derangements and sepsis as outlined above -Maintain a RASS goal of 0 to -1 -Prn Fentanyl  to maintain RASS goal -Avoid sedating medications as able -Daily wake up assessment      Pt is critically ill with multiorgan failure. Prognosis is guarded, high risk for further decompensation, cardiac arrest, and death.  Given current critical illness superimposed on multiple chronic co-morbidities and advanced age, overall long term prognosis is poor.  Pt is DNR/DNI.  Recommend 1-way extubation once optimized with transition to comfort measures if she should not tolerate extubation..  Palliative Care is following to assist with GOC discussions.   Best Practice (right click and Reselect all SmartList Selections daily)   Diet/type: tubefeeds and NPO DVT prophylaxis: systemic heparin  GI prophylaxis: PPI Lines: n/a Foley:  Yes, and it is still needed Code Status:  DNR Last date of multidisciplinary goals of care discussion [6/24]  6/24: Dr. Isadora updated pt's DSS legal guardian Zyana via telephone on plan of care, confirms DNR/DNI with 1 way extubation when meets criteria for extubation.  Labs   CBC: Recent Labs  Lab 10/08/23 0449 10/09/23 0510 10/10/23 0417 10/10/23 1354 10/11/23 0645 10/12/23 0434  WBC 13.2* 16.8* 10.4  --  7.1 7.2  NEUTROABS  --   --   --   --   --  5.7  HGB 7.9* 8.0* 6.9* 9.1* 8.6* 9.7*   HCT 24.4* 25.2* 21.2* 26.5* 25.6* 30.0*  MCV 98.0 98.4 97.7  --  95.9 99.0  PLT 287 322 254  --  238 242    Basic Metabolic Panel: Recent Labs  Lab 10/07/23 0540 10/07/23 1952 10/08/23 0449 10/09/23 0510 10/10/23 0417 10/11/23 0527 10/11/23 1946 10/12/23 0434  NA 135   < > 135 137 139 138  --  148*  K 4.0   < > 4.5 4.4 4.1 3.7 3.7 3.7  CL 106   < > 108 107 105 101  --  109  CO2 24   < > 24 27 28 30   --  34*  GLUCOSE 206*   < > 170* 150* 157* 127*  --  133*  BUN 13   < > 15 16 14 12   --  13  CREATININE <0.30*   < > <0.30* <0.30* <0.30* <0.30*  --  <0.30*  CALCIUM  7.3*   < > 7.2* 7.6* 7.3* 7.6*  --  7.8*  MG 2.0  --  2.0  --  2.1 2.0  --  2.2  PHOS 2.9  --  2.2* 2.4* 1.9* 2.2* 2.0* 2.8   < > = values in this interval not displayed.   GFR: CrCl cannot be calculated (This lab value cannot be used to calculate CrCl because it is not a number: <0.30). Recent Labs  Lab 10/07/23 1951 10/08/23 0449 10/09/23 0510 10/10/23 0417 10/11/23 0645 10/12/23 0434  WBC  --    < > 16.8* 10.4 7.1 7.2  LATICACIDVEN 1.6  --   --   --   --   --    < > = values in this interval not displayed.    Liver Function Tests: Recent Labs  Lab 10/06/23 0643 10/07/23 0540 10/08/23 0449 10/09/23 0510 10/10/23 0417  ALBUMIN  2.3* 2.9* 2.5* 2.4* 2.2*   No results for input(s): LIPASE, AMYLASE in the last 168 hours. No results for input(s): AMMONIA in the last 168 hours.  ABG    Component Value Date/Time   PHART 7.35 10/07/2023 0801   PCO2ART 46 10/07/2023 0801   PO2ART 131 (H) 10/07/2023 0801   HCO3 27.3 10/07/2023 1952   ACIDBASEDEF 0.6 10/07/2023 0801   O2SAT 61 10/07/2023 1952     Coagulation Profile: No results for input(s): INR, PROTIME in the last 168 hours.  Cardiac Enzymes: No results for input(s): CKTOTAL, CKMB, CKMBINDEX, TROPONINI in the last 168 hours.  HbA1C: Hgb A1c MFr Bld  Date/Time Value Ref Range Status  10/04/2023 10:40 AM 5.2 4.8 - 5.6 %  Final    Comment:    (NOTE) Diagnosis of Diabetes The following HbA1c ranges recommended by the American Diabetes Association (ADA) may be used as an aid in the diagnosis of diabetes mellitus.  Hemoglobin             Suggested A1C NGSP%              Diagnosis  <5.7                   Non Diabetic  5.7-6.4                Pre-Diabetic  >6.4                   Diabetic  <7.0                   Glycemic control for                       adults with diabetes.      CBG: Recent Labs  Lab 10/11/23 1126 10/11/23 1643 10/11/23 1914 10/11/23 2310 10/12/23 0310  GLUCAP 138* 136* 120* 121* 130*    Review of Systems:   Unable to assess due to intubation/sedation/critical illness   Past Medical History:  She,  has  a past medical history of Bipolar affective (HCC), Breast cancer (HCC), Cancer (HCC) (12/11/2014), Cardiomegaly, Depression, Emphysema of lung (HCC), Endometriosis, Hyperlipidemia, Osteoporosis, Schizoaffective disorder (HCC), Schizophrenia (HCC), Severe sepsis (HCC) (04/02/2022), Thyroid  nodule, and Vitamin D  deficiency.   Surgical History:   Past Surgical History:  Procedure Laterality Date   ABDOMINAL HYSTERECTOMY     BREAST BIOPSY Left 12-11-14   INVASIVE MAMMARY CARCINOMA.   BREAST LUMPECTOMY WITH SENTINEL LYMPH NODE BIOPSY Left 12/31/2014   Procedure: LEFT BREAST LUMPECTOMY WITH ULTRASOUND GUIDED NEEDLE LOCALIZATION, SENTINEL LYMPH NODE BIOPSY ;  Surgeon: Louanne KANDICE Muse, MD;  Location: [facility] ORS;  Service: General;  Laterality: Left;   DIAGNOSTIC MAMMOGRAM  12/04/2014   Done at [facility] Imaging Category 5-Left Breast   DILATION AND CURETTAGE OF UTERUS     MASTECTOMY Left 2016   SIMPLE MASTECTOMY WITH AXILLARY SENTINEL NODE BIOPSY Left 01/14/2015   Procedure: SIMPLE MASTECTOMY;  Surgeon: Louanne KANDICE Muse, MD;  Location: [facility] ORS;  Service: General;  Laterality: Left;   TENDON REPAIR Right    hand   TONSILLECTOMY     TUBAL LIGATION       Social History:    reports that she has been smoking cigarettes. She has a 45 pack-year smoking history. She has never used smokeless tobacco. She reports that she does not drink alcohol and does not use drugs.   Family History:  Her family history includes Cancer in her mother; Heart attack in her father. There is no history of Breast cancer.   Allergies Allergies  Allergen Reactions   Cat Dander    Lactose Intolerance (Gi)     Diarrhea    Mixed Ragweed    Peanut-Containing Drug Products      Home Medications  Prior to Admission medications   Medication Sig Start Date End Date Taking? Authorizing Provider  acidophilus (RISAQUAD) CAPS capsule Take 2 capsules by mouth 3 (three) times daily. 08/03/23  Yes Wieting, Richard, MD  alendronate  (FOSAMAX ) 70 MG tablet Take 1 tablet (70 mg total) by mouth once a week. Take with a full glass of water  on an empty stomach. 11/04/22  Yes Awanda City, MD  cetirizine  (ZYRTEC ) 10 MG tablet Take 1 tablet (10 mg total) by mouth daily. 11/04/22  Yes Awanda City, MD  cholecalciferol  (VITAMIN D ) 1000 units tablet Take 2 tablets (2,000 Units total) by mouth daily. 11/04/22  Yes Awanda City, MD  clotrimazole  (LOTRIMIN ) 1 % cream Apply bid to areas of redness 08/03/23  Yes Wieting, Richard, MD  cloZAPine  (CLOZARIL ) 100 MG tablet Take 1 tablet (100 mg total) by mouth 2 (two) times daily. 08/03/23  Yes Wieting, Richard, MD  docusate sodium  (COLACE) 100 MG capsule Take 1 capsule (100 mg total) by mouth 2 (two) times daily. 11/04/22  Yes Awanda City, MD  fluvoxaMINE  (LUVOX ) 50 MG tablet Take 1 tablet (50 mg total) by mouth 2 (two) times daily. 11/04/22  Yes Awanda City, MD  glycerin  adult 2 g suppository Place 1 suppository rectally as needed for constipation. 11/04/22  Yes Awanda City, MD  ketoconazole  (NIZORAL ) 2 % shampoo APPLY TO AFFECTED AREA TWICE A WEEK FOR 8 WEEKS; THEN USE AS NEEDED. 11/04/22  Yes Awanda City, MD  letrozole  (FEMARA ) 2.5 MG tablet Take 1 tablet (2.5 mg total) by mouth daily. 11/04/22   Yes Awanda City, MD  LINZESS  145 MCG CAPS capsule Take 1 capsule (145 mcg total) by mouth daily. 11/04/22  Yes Awanda City, MD  liver oil-zinc  oxide (DESITIN) 40 % ointment  Apply topically as needed for irritation. 11/04/22  Yes Awanda City, MD  Melatonin 5 MG CAPS Take 5 mg by mouth at bedtime.   Yes [provider]  midodrine  (PROAMATINE ) 2.5 MG tablet Take 1 tablet (2.5 mg total) by mouth 3 (three) times daily with meals. 08/03/23  Yes Wieting, Richard, MD  mirtazapine (REMERON) 7.5 MG tablet Take 7.5 mg by mouth at bedtime. 08/31/23  Yes [provider]  montelukast  (SINGULAIR ) 5 MG chewable tablet Chew 1 tablet (5 mg total) by mouth at bedtime. 11/04/22  Yes Awanda City, MD  Multiple Vitamin (MULTIVITAMIN WITH MINERALS) TABS tablet Take 1 tablet by mouth daily. 08/03/23  Yes Wieting, Richard, MD  omeprazole  (PRILOSEC) 20 MG capsule Take 1 capsule (20 mg total) by mouth daily. 11/04/22  Yes Awanda City, MD  ondansetron  (ZOFRAN ) 4 MG tablet Take 4 mg by mouth every 6 (six) hours as needed for nausea or vomiting. 07/22/23  Yes [provider]  polyethylene glycol (MIRALAX  / GLYCOLAX ) 17 g packet Take 17 g by mouth daily as needed for moderate constipation. 08/03/23  Yes Wieting, Richard, MD  thiamine  (VITAMIN B1) 100 MG tablet Take 1 tablet (100 mg total) by mouth daily. 08/03/23  Yes Josette Ade, MD  acetaminophen  (TYLENOL ) 325 MG tablet Take 2 tablets (650 mg total) by mouth every 6 (six) hours as needed for mild pain, fever or headache. Patient not taking: Reported on 09/28/2023 11/04/22   Awanda City, MD  coal tar-salicylic acid  2 % shampoo Use to wash scalp Tuesday, Thursday and Saturdays. Patient not taking: Reported on 09/28/2023 11/04/22   Awanda City, MD  feeding supplement (ENSURE ENLIVE / ENSURE PLUS) LIQD Take 237 mLs by mouth 2 (two) times daily between meals. 08/03/23   Josette Ade, MD  traZODone  (DESYREL ) 50 MG tablet Take 25 mg by mouth at bedtime as needed. Patient not  taking: Reported on 09/28/2023 07/26/23   [provider]     Critical care time: 40 minutes     Inge Lecher, AGACNP-BC [facility] Prefer epic messenger for cross cover needs If after hours, please call E-link

## 2023-10-12 NOTE — Progress Notes (Signed)
 PHARMACY CONSULT NOTE  Pharmacy Consult for Electrolyte Monitoring and Replacement   Recent Labs: Potassium (mmol/L)  Date Value  10/12/2023 3.7  12/31/2014 4.0   Magnesium  (mg/dL)  Date Value  93/75/7974 2.2   Calcium  (mg/dL)  Date Value  93/75/7974 7.8 (L)   Albumin  (g/dL)  Date Value  93/77/7974 2.2 (L)   Phosphorus (mg/dL)  Date Value  93/75/7974 2.8   Sodium (mmol/L)  Date Value  10/12/2023 148 (H)   Assessment: 73 y/o female presenting with altered mental status and hypoxia. PMH significant for bipolar disorder, breast cancer, anxiety, depression, hypothyroidism. Pharmacy is asked to follow and replace electrolytes while in CCU  Goal of Therapy:  Electrolytes WNL  Plan:  --no electrolyte replacement warranted for today --recheck Labs tomorrow  Selena Pittman 10/12/2023 7:08 AM

## 2023-10-12 NOTE — Progress Notes (Signed)
 Pt extubated to 2L [place] per order, with no complications. Saturations are 95% on the 2L [place].

## 2023-10-12 NOTE — Plan of Care (Signed)
  Problem: Clinical Measurements: Goal: Respiratory complications will improve Outcome: Progressing Goal: Cardiovascular complication will be avoided Outcome: Progressing   Problem: Coping: Goal: Level of anxiety will decrease Outcome: Progressing   Problem: Pain Managment: Goal: General experience of comfort will improve and/or be controlled Outcome: Progressing   Problem: Safety: Goal: Ability to remain free from injury will improve Outcome: Progressing   Problem: Education: Goal: Knowledge of General Education information will improve Description: Including pain rating scale, medication(s)/side effects and non-pharmacologic comfort measures Outcome: Not Progressing   Problem: Health Behavior/Discharge Planning: Goal: Ability to manage health-related needs will improve Outcome: Not Progressing   Problem: Elimination: Goal: Will not experience complications related to bowel motility Outcome: Not Progressing   Problem: Education: Goal: Knowledge of General Education information will improve Description: Including pain rating scale, medication(s)/side effects and non-pharmacologic comfort measures Outcome: Not Progressing   Problem: Health Behavior/Discharge Planning: Goal: Ability to manage health-related needs will improve Outcome: Not Progressing   Problem: Elimination: Goal: Will not experience complications related to bowel motility Outcome: Not Progressing

## 2023-10-12 NOTE — Progress Notes (Signed)
 PHARMACY - ANTICOAGULATION CONSULT NOTE  Pharmacy Consult for IV Heparin  Indication: pulmonary embolus  Patient Measurements: Height: 5' 5 (165.1 cm) Weight: 60.6 kg (133 lb 9.6 oz) IBW/kg (Calculated) : 57 HEPARIN  DW (KG): 64  Labs: Recent Labs    10/10/23 0417 10/10/23 1354 10/11/23 0527 10/11/23 0645 10/12/23 0434  HGB 6.9* 9.1*  --  8.6* 9.7*  HCT 21.2* 26.5*  --  25.6* 30.0*  PLT 254  --   --  238 242  HEPARINUNFRC 0.55  --  0.41  --  0.53  CREATININE <0.30*  --  <0.30*  --   --     CrCl cannot be calculated (This lab value cannot be used to calculate CrCl because it is not a number: <0.30).   Medical History: Past Medical History:  Diagnosis Date   Bipolar affective (HCC)    Breast cancer (HCC)    Cancer (HCC) 12/11/2014   INVASIVE MAMMARY CARCINOMA/.left breast/ T2 N0   Cardiomegaly    Depression    Emphysema of lung (HCC)    Endometriosis    Hyperlipidemia    Osteoporosis    Schizoaffective disorder (HCC)    Schizophrenia (HCC)    Severe sepsis (HCC) 04/02/2022   Thyroid  nodule    Vitamin D  deficiency    Assessment: 73 y/o female presenting with altered mental status and hypoxia. PMH significant for bipolar disorder, breast cancer, anxiety, depression, hypothyroidism. CT imaging shows pulmonary embolism.Per chart review, patient is not on anticoagulation prior to admission. Pharmacy has been consulted to re- initiate and manage heparin  infusion.  Patient started Xarelto  (last dose 06/14 0825). Now transitioned back to heparin  drip.  Goal of Therapy:  Heparin  level 0.3-0.7 units/ml aPTT 66 - 102s Monitor platelets by anticoagulation protocol: Yes  06/16 0846 HL 0.13, aPTT 67s, subthera; 650 un/hr 06/16 2023 HL 0.24, subthera; 750 un/hr 06/17 0429 HL 0.31, therapeutic x 1 06/17 1244 HL 0.28, subthera; 850 un/hr 06/17 2252 HL 0.34, therapeutic x 1 06/18 0643 HL 0.40, therapeutic x 2 06/19 0540 HL 0.37, therapeutic X 3  06/20 0449 HL 0.53,  therapeutic X 4  06/21 0510 HL 0.62, therapeutic X 5  06/22 0417 HL 0.55, therapeutic X 6 06/23 0527 HL 0.41, therapeutic X 7 06/24 0434 HL 0.53, therapeutic x 8  ------------------------------------------------ 06/22 2043 Heparin  drip restarted  Plan:  - continue pt on current rate and recheck HL on 6/25 with AM labs.  --CBC daily  Thank you for involving pharmacy in this patient's care.   Rankin CANDIE Dills, PharmD, [facility] 10/12/2023 5:13 AM

## 2023-10-13 DIAGNOSIS — A4101 Sepsis due to Methicillin susceptible Staphylococcus aureus: Secondary | ICD-10-CM | POA: Diagnosis not present

## 2023-10-13 DIAGNOSIS — R531 Weakness: Secondary | ICD-10-CM | POA: Diagnosis not present

## 2023-10-13 DIAGNOSIS — Z515 Encounter for palliative care: Secondary | ICD-10-CM | POA: Diagnosis not present

## 2023-10-13 DIAGNOSIS — A419 Sepsis, unspecified organism: Secondary | ICD-10-CM | POA: Diagnosis not present

## 2023-10-13 DIAGNOSIS — Z720 Tobacco use: Secondary | ICD-10-CM

## 2023-10-13 DIAGNOSIS — J189 Pneumonia, unspecified organism: Secondary | ICD-10-CM | POA: Diagnosis not present

## 2023-10-13 DIAGNOSIS — Z7189 Other specified counseling: Secondary | ICD-10-CM | POA: Diagnosis not present

## 2023-10-13 DIAGNOSIS — F209 Schizophrenia, unspecified: Secondary | ICD-10-CM

## 2023-10-13 DIAGNOSIS — Z66 Do not resuscitate: Secondary | ICD-10-CM | POA: Diagnosis not present

## 2023-10-13 LAB — CBC
HCT: 30.5 % — ABNORMAL LOW (ref 36.0–46.0)
Hemoglobin: 9.7 g/dL — ABNORMAL LOW (ref 12.0–15.0)
MCH: 32 pg (ref 26.0–34.0)
MCHC: 31.8 g/dL (ref 30.0–36.0)
MCV: 100.7 fL — ABNORMAL HIGH (ref 80.0–100.0)
Platelets: 217 10*3/uL (ref 150–400)
RBC: 3.03 MIL/uL — ABNORMAL LOW (ref 3.87–5.11)
RDW: 17.2 % — ABNORMAL HIGH (ref 11.5–15.5)
WBC: 8.5 10*3/uL (ref 4.0–10.5)
nRBC: 0.2 % (ref 0.0–0.2)

## 2023-10-13 LAB — BASIC METABOLIC PANEL WITH GFR
Anion gap: 8 (ref 5–15)
BUN: 13 mg/dL (ref 8–23)
CO2: 32 mmol/L (ref 22–32)
Calcium: 7.7 mg/dL — ABNORMAL LOW (ref 8.9–10.3)
Chloride: 107 mmol/L (ref 98–111)
Creatinine, Ser: <0.3 mg/dL — ABNORMAL LOW (ref 0.44–1.00)
Glucose, Bld: 121 mg/dL — ABNORMAL HIGH (ref 70–99)
Potassium: 3.8 mmol/L (ref 3.5–5.1)
Sodium: 147 mmol/L — ABNORMAL HIGH (ref 135–145)

## 2023-10-13 LAB — GLUCOSE, CAPILLARY
Glucose-Capillary: 118 mg/dL — ABNORMAL HIGH (ref 70–99)
Glucose-Capillary: 133 mg/dL — ABNORMAL HIGH (ref 70–99)
Glucose-Capillary: 130 mg/dL — ABNORMAL HIGH (ref 70–99)
Glucose-Capillary: 130 mg/dL — ABNORMAL HIGH (ref 70–99)
Glucose-Capillary: 121 mg/dL — ABNORMAL HIGH (ref 70–99)
Glucose-Capillary: 119 mg/dL — ABNORMAL HIGH (ref 70–99)

## 2023-10-13 LAB — MAGNESIUM: Magnesium: 2.4 mg/dL (ref 1.7–2.4)

## 2023-10-13 LAB — PHOSPHORUS: Phosphorus: 2.9 mg/dL (ref 2.5–4.6)

## 2023-10-13 LAB — HEPARIN LEVEL (UNFRACTIONATED): Heparin Unfractionated: 0.56 [IU]/mL (ref 0.30–0.70)

## 2023-10-13 MED ORDER — ZINC OXIDE 40 % EX OINT
TOPICAL_OINTMENT | Freq: Every day | CUTANEOUS | Status: DC
  Filled 2023-10-13: qty 113

## 2023-10-13 MED ORDER — GERHARDT'S BUTT CREAM
TOPICAL_CREAM | Freq: Two times a day (BID) | CUTANEOUS | Status: DC
  Filled 2023-10-13: qty 60

## 2023-10-13 MED ORDER — MORPHINE SULFATE (PF) 2 MG/ML IV SOLN
1.0000 mg | INTRAVENOUS | Status: DC | PRN
  Administered 2023-10-14: 2 mg via INTRAVENOUS
  Filled 2023-10-13: qty 1

## 2023-10-13 NOTE — Evaluation (Signed)
 Occupational Therapy Evaluation Patient Details Name: Selena Pittman MRN: [id number] DOB: 24-May-1950 Today's Date: 10/13/2023   History of Present Illness   Patient is a 73 year old female from [facility] with toxic metabolic encephalopathy, septic shock, and hypoxic respiratory failure secondary to UTI and aspiration pneumonia. Has been intubated twice, last extubated 6/24, now requiring HHFNC.     Clinical Impressions Pt was seen for OT evaluation this date. Per chart review, pt has needed assist for all ADLs at baseline at her facility where she resides. She was able to self feed with set up assist and per chart 1 year ago she was ambulating short distances with assist.  Pt presents to acute OT demonstrating impaired ADL performance and functional mobility 2/2 weakness, poor cognition, poor tone, and balance deficits. Pt currently requires total assist x2 for all bed mobility and total assist to maintain seated balance at EOB. Pt is alert with eyes open and able to attend to whoever is speaking, but does not follow commands or verbalize during session. Pt does mouth bye bye when exiting the room. PROM WFL in BUEs, however no AROM noted. Flexion contracture to digits 3-5 on R hand. Vitals stable throughout. Pt would benefit from skilled OT services to address noted impairments and functional limitations to maximize safety and independence while minimizing falls risk and caregiver burden. Do anticipate the need for follow up OT services upon acute hospital DC.      If plan is discharge home, recommend the following:   Two people to help with walking and/or transfers;Two people to help with bathing/dressing/bathroom     Functional Status Assessment   Patient has had a recent decline in their functional status and demonstrates the ability to make significant improvements in function in a reasonable and predictable amount of time.     Equipment Recommendations   Other  (comment) (has equipment at LTC)     Recommendations for Other Services         Precautions/Restrictions   Precautions Precautions: Fall Recall of Precautions/Restrictions: Impaired Restrictions Weight Bearing Restrictions Per Provider Order: No     Mobility Bed Mobility Overal bed mobility: Needs Assistance Bed Mobility: Supine to Sit, Sit to Supine     Supine to sit: +2 for physical assistance, Total assist Sit to supine: Total assist, +2 for physical assistance   General bed mobility comments: pt unable to initiate any active movement, no core strength or trunk activation needed total assist to maintain seated position at EOB    Transfers                          Balance Overall balance assessment: Needs assistance Sitting-balance support: Feet supported, Bilateral upper extremity supported Sitting balance-Leahy Scale: Zero Sitting balance - Comments: no righting reactions, poor balance all directions Postural control: Posterior lean, Right lateral lean, Left lateral lean                                 ADL either performed or assessed with clinical judgement   ADL Overall ADL's : Needs assistance/impaired                                       General ADL Comments: total assist for all tasks at this time     Vision  Perception         Praxis         Pertinent Vitals/Pain Pain Assessment Pain Assessment: PAINAD Breathing: normal Negative Vocalization: none Facial Expression: smiling or inexpressive Body Language: relaxed Consolability: no need to console PAINAD Score: 0 Facial Expression: Relaxed, neutral Body Movements: Absence of movements Muscle Tension: Relaxed Compliance with ventilator (intubated pts.): N/A Vocalization (extubated pts.): N/A CPOT Total: 0     Extremity/Trunk Assessment Upper Extremity Assessment Upper Extremity Assessment: Defer to OT evaluation RUE Deficits /  Details: flexion contracture on 3-5th digits on R hand   Lower Extremity Assessment Lower Extremity Assessment: Generalized weakness;LLE deficits/detail;RLE deficits/detail RLE Deficits / Details: stiff with PROM. prefers to rest hip in externally rotated position. minimal to no active movement noted LLE Deficits / Details: PROM grossly WFL. minimal to no movement is noted.       Communication Communication Communication: Impaired Factors Affecting Communication: Difficulty expressing self   Cognition Arousal: Alert Behavior During Therapy: Flat affect Cognition: Difficult to assess Difficult to assess due to: Impaired communication           OT - Cognition Comments: pt is nonverbal, only able to mouth bye; otherwise no verbalizations or expressions during sessin; follows whoever is speaking with her eyes                 Following commands: Impaired Following commands impaired:  (does not follow any commands)     Cueing  General Comments   Cueing Techniques: Verbal cues;Gestural cues;Tactile cues;Visual cues  vitals stable throughout session. Sp02 99-100% on [facility]   Exercises     Shoulder Instructions      Home Living Family/patient expects to be discharged to::  [facility])                                        Prior Functioning/Environment Prior Level of Function : Patient poor historian/Family not available             Mobility Comments: assistance required. patient was walking short distance with assistance while here one year ago ADLs Comments: per notes, can feed herself intermittently with set-up. assistance requried for ADLs    OT Problem List: Decreased strength;Decreased cognition;Decreased range of motion;Decreased activity tolerance;Impaired balance (sitting and/or standing);Impaired tone;Impaired UE functional use   OT Treatment/Interventions: Self-care/ADL training;Therapeutic exercise;Therapeutic  activities;Patient/family education;Balance training      OT Goals(Current goals can be found in the care plan section)   Acute Rehab OT Goals OT Goal Formulation: Patient unable to participate in goal setting Time For Goal Achievement: 10/27/23 Potential to Achieve Goals: Poor ADL Goals Pt Will Perform Eating: bed level;with min assist Additional ADL Goal #1: Pt will demo ability to follow 1 step directions on 3/3 trials in preparation for ADL performance.   OT Frequency:  Min 1X/week    Co-evaluation PT/OT/SLP Co-Evaluation/Treatment: Yes Reason for Co-Treatment: Complexity of the patient's impairments (multi-system involvement);To address functional/ADL transfers PT goals addressed during session: Mobility/safety with mobility        AM-PAC OT 6 Clicks Daily Activity     Outcome Measure Help from another person eating meals?: Total Help from another person taking care of personal grooming?: Total Help from another person toileting, which includes using toliet, bedpan, or urinal?: Total Help from another person bathing (including washing, rinsing, drying)?: Total Help from another person to put on and  taking off regular upper body clothing?: Total Help from another person to put on and taking off regular lower body clothing?: Total 6 Click Score: 6   End of Session Equipment Utilized During Treatment: Oxygen (NG tube, HHFNC) Nurse Communication: Mobility status  Activity Tolerance: Treatment limited secondary to medical complications (Comment);Other (comment) (poor cognition) Patient left: in bed;with call bell/phone within reach;with bed alarm set  OT Visit Diagnosis: Other abnormalities of gait and mobility (R26.89)                Time: 9074-9053 OT Time Calculation (min): 21 min Charges:  OT General Charges $OT Visit: 1 Visit OT Evaluation $OT Eval Low Complexity: 1 Low Cliffard Hair, OTR/L  10/13/23, 12:24 PM  Ayeza Therriault E Tashayla Therien 10/13/2023, 12:15 PM

## 2023-10-13 NOTE — TOC Progression Note (Signed)
 Transition of Care [facility]) - Progression Note    Patient Details  Name: Selena Pittman MRN: [id number] Date of Birth: 1950/12/04  [facility]) CM/SW Contact  Kaylob Wallen A Edrees Valent, RN Phone Number: 10/13/2023, 10:27 AM  Clinical Narrative:    Chart reviewed.  Noted that patient is at high risk for declining.  Noted that Palliative Care team is recommending Comfort Care Measures.  I have spoken with DSS worker Zyannah Haizlip.  Her contact number is [phone].  Zyannah informs me that she has spoken with her Supervisor about recommends.  She reports that she has informed her Supervisor of this information and is awaiting approval.  She also informs me that the Supervisor has to receive approval for her upper leadership team.  Zyannah informs me that the an approval has not been given at this time.    TOC will continue to follow for discharge planning.     Expected Discharge Plan: Skilled Nursing Facility Barriers to Discharge: Continued Medical Work up  Expected Discharge Plan and Services       Living arrangements for the past 2 months: Skilled Nursing Facility                                       Social Determinants of Health (SDOH) Interventions SDOH Screenings   Food Insecurity: No Food Insecurity (10/29/2022)  Housing: Low Risk  (10/29/2022)  Transportation Needs: No Transportation Needs (10/29/2022)  Utilities: Not At Risk (10/29/2022)  Social Connections: Patient Unable To Answer (09/29/2023)  Tobacco Use: High Risk (09/28/2023)    Readmission Risk Interventions    09/30/2023   10:38 AM 07/30/2023   12:52 PM  Readmission Risk Prevention Plan  Transportation Screening Complete Complete  PCP or Specialist Appt within 3-5 Days Complete   HRI or Home Care Consult  Complete  Social Work Consult for Recovery Care Planning/Counseling Complete   Palliative Care Screening Not Applicable   Medication Review Oceanographer) Complete Complete

## 2023-10-13 NOTE — Progress Notes (Signed)
 PHARMACY CONSULT NOTE  Pharmacy Consult for Electrolyte Monitoring and Replacement   Recent Labs: Potassium (mmol/L)  Date Value  10/13/2023 3.8  12/31/2014 4.0   Magnesium  (mg/dL)  Date Value  93/74/7974 2.4   Calcium  (mg/dL)  Date Value  93/74/7974 7.7 (L)   Albumin  (g/dL)  Date Value  93/77/7974 2.2 (L)   Phosphorus (mg/dL)  Date Value  93/74/7974 2.9   Sodium (mmol/L)  Date Value  10/13/2023 147 (H)   Assessment: 73 y/o female presenting with altered mental status and hypoxia. PMH significant for bipolar disorder, breast cancer, anxiety, depression, hypothyroidism. Pharmacy is asked to follow and replace electrolytes while in CCU  Goal of Therapy:  Electrolytes WNL  Plan:  --no electrolyte replacement warranted for today --recheck Labs tomorrow  Adriana JONETTA Bolster 10/13/2023 7:05 AM

## 2023-10-13 NOTE — Progress Notes (Signed)
 Nutrition Follow-up  DOCUMENTATION CODES:   Underweight, Severe malnutrition in context of chronic illness  INTERVENTION:   -Continue TF via NGT:   Osmolite 1.2 @ 55 ml/hr  100 ml free water  flush every 4 hours  Tube feeding regimen provides 1584 kcal (100% of needs), 73 grams of protein, and 1082 ml of H2O.  Total free water : 1682  -Continue 100 mg thiamine  daily via tube -Continue MVI with minerals daily via tube -Continue 500 mg vitamin C  daily via tube  NUTRITION DIAGNOSIS:   Severe Malnutrition related to social / environmental circumstances as evidenced by severe fat depletion, severe muscle depletion, percent weight loss.  Ongoing  GOAL:   Patient will meet greater than or equal to 90% of their needs  Met with TF  MONITOR:   Diet advancement, TF tolerance  REASON FOR ASSESSMENT:   Consult Assessment of nutrition requirement/status  ASSESSMENT:   73 y/o female with h/o emphysema, MDD, anxiety, chronic constipation, GERD, schizophrenia, bipolar disorder, HLD, bedbound and resides at SNF, breast cancer s/p mastectomy (2016), hypothyroidism and necrotizing pneumonia who is admitted with UTI, PNA, septic shock, fecal impaction, rhinovirus and parainfluenza, BLE DVTs and PE.  6/14- rapid response called, intubated, s/p brochoscopy  6/16- s/p bronchoscopy 6/17- ESBL, MRSA, parainfluenza 3 and rhinoevirus positive 6/18- extubated, NGT placed for nutrition support 6/19- re-intubated, TF resumed 6/24- extubated  Reviewed I/O's: -331 ml x 24 hours and +21.1 L since 09/29/23  UOP: 875 ml x 24 hours  Rectal tube output: 325 ml x 24 hours  Per [facility] notes, pt with DPTI to sacrum (incontinence related dermatitis due to fecal, urinary or dual incontinence).   Pt sitting up in bed at time of visit. Pt is NPO and reliant on NGT for sole source nutrition. Osmolite 1.2 infusing via NGT at goal rate of 55 ml/hr. Pt tolerating well.   No wt loss over the past week.    Palliative care following for goals of care. Pt is declining and recommending comfort care. This was discussed with DSS. Awaiting DSS decisions.   Medications reviewed and include vitamin C , solu-cortef , protamine, and thiamine .   Labs reviewed: Na: 147, K, Mg and Phos WDL, CBGS: 111-133.   Diet Order:   Diet Order             Diet NPO time specified  Diet effective now                   EDUCATION NEEDS:   Education needs have been addressed  Skin:  Skin Assessment: Skin Integrity Issues: Skin Integrity Issues:: Other (Comment) Stage II: sacrum Other: DPTI to sacrum  Last BM:  10/13/23 (via rectal tube)  Height:   Ht Readings from Last 1 Encounters:  10/10/23 5' 5 (1.651 m)    Weight:   Wt Readings from Last 1 Encounters:  10/13/23 59.6 kg    Ideal Body Weight:  56.8 kg  BMI:  Body mass index is 21.87 kg/m.  Estimated Nutritional Needs:   Kcal:  1500-1700  Protein:  70-85 grams  Fluid:  1.5-1.7 L    Margery ORN, RD, LDN, CDCES Registered Dietitian III Certified Diabetes Care and Education Specialist If unable to reach this RD, please use RD Inpatient group chat on secure chat between hours of 8am-4 pm daily

## 2023-10-13 NOTE — Evaluation (Signed)
 Physical Therapy Evaluation Patient Details Name: Adonai Helzer MRN: [id number] DOB: [birth year] Today's Date: 10/13/2023  History of Present Illness  Patient is a 73 year old female from [facility] with toxic metabolic encephalopathy, septic shock, and hypoxic respiratory failure secondary to UTI and aspiration pneumonia. Has been intubated twice, last extubated 6/24, now requiring HHFNC.  Clinical Impression  Patient seen for PT evaluation. She is unable to follow commands during the session. She does track therapist around the room and she does look when her name is called. Total assistance required for bed mobility today. Sitting balance required total assistance with no protective righting reactions demonstrated while challenged in sitting position. Vitals stable throughout. Recommend PT follow up to maximize independence and decrease caregiver burden as patient able to participate and pending patient/family goals of care.       If plan is discharge home, recommend the following: Two people to help with walking and/or transfers;Two people to help with bathing/dressing/bathroom;Assist for transportation;Help with stairs or ramp for entrance;Supervision due to cognitive status;Assistance with feeding   Can travel by private vehicle   No    Equipment Recommendations None recommended by PT  Recommendations for Other Services       Functional Status Assessment Patient has had a recent decline in their functional status and demonstrates the ability to make significant improvements in function in a reasonable and predictable amount of time.     Precautions / Restrictions Precautions Precautions: Fall Recall of Precautions/Restrictions: Impaired Restrictions Weight Bearing Restrictions Per Provider Order: No      Mobility  Bed Mobility Overal bed mobility: Needs Assistance Bed Mobility: Supine to Sit, Sit to Supine     Supine to sit: Total assist, +2 for physical  assistance Sit to supine: Total assist, +2 for physical assistance   General bed mobility comments: no initiation with bed mobility    Transfers                   General transfer comment: unable to attempt due to poor participation    Ambulation/Gait                  Stairs            Wheelchair Mobility     Tilt Bed    Modified Rankin (Stroke Patients Only)       Balance Overall balance assessment: Needs assistance Sitting-balance support: Feet supported, Bilateral upper extremity supported Sitting balance-Leahy Scale: Zero Sitting balance - Comments: no protective righting reactions while sitting when challenged with decreased trunk support. loss of balance posteriorly, left, and right Postural control: Posterior lean, Right lateral lean, Left lateral lean                                   Pertinent Vitals/Pain Pain Assessment Pain Assessment: PAINAD Breathing: normal Negative Vocalization: none Facial Expression: smiling or inexpressive Body Language: relaxed Consolability: no need to console PAINAD Score: 0    Home Living Family/patient expects to be discharged to::  [facility])                        Prior Function Prior Level of Function : Patient poor historian/Family not available             Mobility Comments: assistance required. patient was walking short distance with assistance while here one year ago ADLs Comments:  per notes, can feed herself intermittently with set-up. assistance requried for ADLs     Extremity/Trunk Assessment   Upper Extremity Assessment Upper Extremity Assessment: Defer to OT evaluation RUE Deficits / Details: flexion contracture on 3-5th digits on R hand    Lower Extremity Assessment Lower Extremity Assessment: Generalized weakness;LLE deficits/detail;RLE deficits/detail RLE Deficits / Details: stiff with PROM. prefers to rest hip in externally rotated  position. minimal to no active movement noted LLE Deficits / Details: PROM grossly WFL. minimal to no movement is noted.       Communication   Communication Communication: Impaired Factors Affecting Communication: Difficulty expressing self    Cognition Arousal: Alert Behavior During Therapy: Flat affect   PT - Cognitive impairments: Difficult to assess Difficult to assess due to: Impaired communication                       Following commands: Impaired Following commands impaired:  (unable to follow commands today)     Cueing Cueing Techniques: Verbal cues, Gestural cues, Tactile cues, Visual cues     General Comments General comments (skin integrity, edema, etc.): vitals stable throughout session. Sp02 99-100% on HHFNC    Exercises     Assessment/Plan    PT Assessment Patient needs continued PT services  PT Problem List Decreased strength;Decreased range of motion;Decreased activity tolerance;Decreased balance;Decreased mobility;Decreased cognition;Decreased knowledge of use of DME;Decreased safety awareness;Cardiopulmonary status limiting activity       PT Treatment Interventions DME instruction;Functional mobility training;Therapeutic activities;Therapeutic exercise;Balance training;Neuromuscular re-education;Cognitive remediation;Patient/family education;Wheelchair mobility training    PT Goals (Current goals can be found in the Care Plan section)  Acute Rehab PT Goals Patient Stated Goal: patient unable to participate with goal setting PT Goal Formulation: Patient unable to participate in goal setting Time For Goal Achievement: 10/27/23 Potential to Achieve Goals: Poor    Frequency Min 1X/week     Co-evaluation PT/OT/SLP Co-Evaluation/Treatment: Yes Reason for Co-Treatment: Complexity of the patient's impairments (multi-system involvement);To address functional/ADL transfers PT goals addressed during session: Mobility/safety with mobility          AM-PAC PT 6 Clicks Mobility  Outcome Measure Help needed turning from your back to your side while in a flat bed without using bedrails?: Total Help needed moving from lying on your back to sitting on the side of a flat bed without using bedrails?: Total Help needed moving to and from a bed to a chair (including a wheelchair)?: Total Help needed standing up from a chair using your arms (e.g., wheelchair or bedside chair)?: Total Help needed to walk in hospital room?: Total Help needed climbing 3-5 steps with a railing? : Total 6 Click Score: 6    End of Session   Activity Tolerance: Patient limited by fatigue Patient left: in bed;with call bell/phone within reach Nurse Communication: Mobility status PT Visit Diagnosis: Difficulty in walking, not elsewhere classified (R26.2);Muscle weakness (generalized) (M62.81)    Time: 9074-9053 PT Time Calculation (min) (ACUTE ONLY): 21 min   Charges:   PT Evaluation $PT Eval High Complexity: 1 High   PT General Charges $$ ACUTE PT VISIT: 1 Visit        Randine Essex, PT, MPT  Randine LULLA Essex 10/13/2023, 11:35 AM

## 2023-10-13 NOTE — Progress Notes (Signed)
 PROGRESS NOTE Selena Pittman    DOB: Jun 18, 1950, 73 y.o.  FMW:[id number]    Code Status: Limited: Do not attempt resuscitation (DNR) -DNR-LIMITED -Do Not Intubate/DNI    DOA: 09/28/2023   LOS: 15  Brief hospital course  Selena Pittman is a 73 y.o. female with a PMH significant for hypotension on midodrine , HLD, left breast cancer, bipolar, anxiety, depression, schizophrenia, schizoaffective disorder, and previous suicide attempt. She presented to ED via EMS 09/28/2023 from [facility] healthcare complaining of lethargy, hypotension and hypoxia.  She was admitted for severe sepsis 2/2 multilobar PNA, pyuria, acute PE and severe protein calorie malnutrition.  Intubated 6/14-8/18, reintubated 6/19-6/24. She was bade DNR/DNI 6/22. She currently is on high level of non-invasive respiratory support.  Alert but unable to respond nor follow commands on exam.  Completed antibiotic treatment of MSSA CAP and parainfluenza infection and ecoli UTI. Palliative is following. Ongoing GOC discussions happening with DSS guardian.  Her care was transferred to [facility] today.   Assessment & Plan  Principal Problem:   Sepsis due to pneumonia [facility]) Active Problems:   Acute hypoxic respiratory failure (HCC)   Multilobar lung infiltrate   UTI (urinary tract infection)   Acute pulmonary embolism (HCC)   Right-sided sensory deficit present   Hypokalemia   Protein-calorie malnutrition, severe (HCC)   History of breast cancer   Tobacco use   GERD (gastroesophageal reflux disease)   Schizophrenia (HCC)   Leukocytosis   Weakness   Bipolar disorder (HCC)   Hyperlipidemia   Pressure injury of skin   Deep vein thrombosis (DVT) of femoral vein of both lower extremities (HCC)  #Acute Hypoxic Respiratory Failure due to #MSSA Pneumonia & Rhinovirus/Parainfluenza Viral Pneumonia #Pulmonary Embolism Intubated 6/14-8/18, reintubated 6/19-6/24. She was bade DNR/DNI 6/22. She currently is on high level of non-invasive  respiratory support.  Alert but unable to respond nor follow commands on exam. Currently on 40L HHFNC 40% FiO2.  Completed antibiotic treatment of MSSA CAP and parainfluenza infection and ecoli UTI. - remains on hepatin gtt for PE treatment.  - chest xray 6/20 demonstrated improvement in appearance of pneumonia.  - intermittently received diuretics to treat volume overload but limited with electrolyte abnormalities. Has generalized mild anasarca related to prolonged ICU/bed-bound status.  - palliative following, appreciate your care  NPO- unable to tolerate PO when intubated and had NG placed.  - NG tube in place for feeds, RD consulted - SLP consult - pharmacy consult for electrolyte management    #Shock: Septic ~ RESOLVED.  #Mildly Elevated Troponin due to demand ischemia- peaked at 49  PMHx: HTN, HLD -Echocardiogram 09/29/23: LVEF 60-65%, normal diastolic parameters, RV systolic function not well visualized, RV size normal -Continuous cardiac monitoring -Vasopressors weaned off, -Continue Midodrine  5 mg TID   #ESBL E. Coli UTI ~ TREATED. Foley in place.  -Completed course of ABX   #Bilateral DVT's #Pulmonary Embolism  -Monitor for S/Sx of bleeding -Trend CBC -Heparin  gtt for AC/VTE treatment    #Acute Metabolic Encephalopathy #Sedation needs in setting of mechanical ventilation PMHx: Bipolar disorder, schizophrenia, depression -Treatment of metabolic derangements and sepsis as outlined above -Avoid sedating medications as able  Body mass index is 21.87 kg/m.  VTE ppx: Place TED hose Start: 09/28/23 1701  Diet:     Diet   Diet NPO time specified   Consultants: CCM Palliative   Subjective 10/13/23    Pt reports nothing. She is alert and tracks with eyes but unable to respond to questions nor follow commands. Appears comfortable  Objective  Blood pressure 125/89, pulse (!) 107, temperature 98.6 F (37 C), temperature source Oral, resp. rate 15, height 5' 5  (1.651 m), weight 59.6 kg, SpO2 100%.  Intake/Output Summary (Last 24 hours) at 10/13/2023 0802 Last data filed at 10/13/2023 0700 Gross per 24 hour  Intake 620.85 ml  Output 1175 ml  Net -554.15 ml   Filed Weights   10/11/23 0500 10/12/23 0315 10/13/23 0400  Weight: 64.1 kg 60.6 kg 59.6 kg     Physical Exam:  General: awake, alert, NAD HEENT: atraumatic, clear conjunctiva, anicteric sclera, MMM Respiratory: normal respiratory effort. Decreased respirations  Cardiovascular: quick capillary refill, normal S1/S2, mildly increase rate Gastrointestinal: soft, NT, ND. Rectal tube in place, cath in place Nervous: unable to follow commands nor respond to questions Extremities: pitting edema diffusely  Skin: dry, intact, normal temperature, normal color. No rashes, lesions or ulcers on exposed skin  Labs   I have personally reviewed the following labs and imaging studies CBC    Component Value Date/Time   WBC 8.5 10/13/2023 0320   RBC 3.03 (L) 10/13/2023 0320   HGB 9.7 (L) 10/13/2023 0320   HCT 30.5 (L) 10/13/2023 0320   PLT 217 10/13/2023 0320   MCV 100.7 (H) 10/13/2023 0320   MCH 32.0 10/13/2023 0320   MCHC 31.8 10/13/2023 0320   RDW 17.2 (H) 10/13/2023 0320   LYMPHSABS 0.4 (L) 10/12/2023 0434   MONOABS 0.4 10/12/2023 0434   EOSABS 0.0 10/12/2023 0434   BASOSABS 0.0 10/12/2023 0434      Latest Ref Rng & Units 10/13/2023    3:20 AM 10/12/2023    4:34 AM 10/11/2023    7:46 PM  BMP  Glucose 70 - 99 mg/dL 878  866    BUN 8 - 23 mg/dL 13  13    Creatinine 9.55 - 1.00 mg/dL <9.69  <9.69    Sodium 135 - 145 mmol/L 147  148    Potassium 3.5 - 5.1 mmol/L 3.8  3.7  3.7   Chloride 98 - 111 mmol/L 107  109    CO2 22 - 32 mmol/L 32  34    Calcium  8.9 - 10.3 mg/dL 7.7  7.8     No results found.  Disposition Plan & Communication  Patient status: Inpatient  Admitted From: SNF Planned disposition location: TBD Anticipated discharge date: TBD pending GOC  Family Communication:  none at bedside    Author: Marien LITTIE Piety, DO [facility] 10/13/2023, 8:02 AM   Available by Epic secure chat 7AM-7PM. If 7PM-7AM, please contact night-coverage.  [facility] contact information found on [email or web address].

## 2023-10-13 NOTE — Progress Notes (Signed)
 Daily Progress Note   Date: 10/13/2023   Patient Name: Selena Pittman  DOB: 08-31-1950  MRN: [id number]  Age / Sex: 73 y.o., female  Attending Physician: Lenon Marien CROME, MD Primary Care Physician: Donal Channing SQUIBB, FNP Admit Date: 09/28/2023 Length of Stay: 15 days  Reason for Consultation: Establishing goals of care  Past Medical History:  Diagnosis Date   Bipolar affective (HCC)    Breast cancer (HCC)    Cancer (HCC) 12/11/2014   INVASIVE MAMMARY CARCINOMA/.left breast/ T2 N0   Cardiomegaly    Depression    Emphysema of lung (HCC)    Endometriosis    Hyperlipidemia    Osteoporosis    Schizoaffective disorder (HCC)    Schizophrenia (HCC)    Severe sepsis (HCC) 04/02/2022   Thyroid  nodule    Vitamin D  deficiency     Subjective:   Subjective: Chart Reviewed. Updates received. Patient Assessed. Created space and opportunity for patient  and family to explore thoughts and feelings regarding current medical situation.  Today's Discussion: Today before meeting with the patient/family, I reviewed the chart including family medicine note from today, dietitian note from today, PT and OT notes from today.  I also reviewed nursing flowsheets and vitals.  The patient continues to do poorly from a respiratory standpoint, although she is stable.  However, she is requiring 40 to 50 L/min heated high flow nasal cannula.  Medical team recommendation continues to be for comfort care.  We have not heard back from DSS supervisor as of yet.  I reviewed labs including CBC showing stable white blood cell count, hemoglobin.  BMP shows persistent hyponatremia but stable/normal kidney function.  Today went to the bedside and saw the patient.  She was lying in bed with heated high flow nasal cannula on.  She is tachypneic with a respiratory rate in the 30s, moderate increased work of breathing.  She is verbally not responsive although she is awake.  She is unable to answer questions even with  head nods.  I spoke with the bedside nurse about plan for today.  States that she is stable but on a lot of oxygen, high risk for decline.  I discussed that I would continue to attempt to follow-up with DSS about comfort care status.  Later in the day I reached out to DSS guardian listed in the chart [name].  We discussed pending supervisor approval of transition to comfort care and she states that she will reach out to her program director to see if she can get a decision.  After speaking with DSS guardian I called and updated the patient's sister and we had a good conversation.  Patient sister is in agreement with transition to comfort care.  She states that the patient's son has been very faithful to her the years and the patient has been declining for the past couple years and notes a poor quality of life over this time span as well.  Afterward I called and updated the patient's son.  He understands that she is very sick and unlikely to survive.  He is not against transition to comfort care but is glad that he is not the one having to make decision.  I informed him that I would call and notify him when we do hear back from DSS.  After speaking with the patient's son DSS program director Alberta Estrin with [facility] Department of Social Services called back.  After discussing the patient situation case she is in agreement that  the patient is struggled for several days and she gave verbal authorization to transition the patient to comfort care.  I called the patient's son back and informed him of this decision.  We discussed prognosis and I indicated that once she is officially transition to full comfort care and we begin to wean back certain medications and oxygen that she likely would only have hours to a day.  He seems a bit overwhelmed about this.  He is asking if we can hold off on official transition until tomorrow afternoon when he and his sister can come see the patient and say  goodbye.  I called DSS back and informed her of the plan to hold off on official transition to comfort care.  In order to ensure that the patient is not suffering from respiratory distress I would add as needed morphine  to help with any dyspnea, tachypnea, increased work of breathing, discomfort.  However, we would leave her oxygen at the levels that at that to support time for family to come [employer].  She agreed.  I provided emotional and general support through therapeutic listening, empathy, sharing of stories, and other techniques. I answered all questions and addressed all concerns to the best of my ability.  Throughout this all the process I continue to update the medical team and nursing teams.  All are on board and understand the plan moving forward.  Palliative medicine will continue to follow.  Review of Systems  Unable to perform ROS   Objective:   Primary Diagnoses: Present on Admission:  Sepsis due to pneumonia (HCC)  Tobacco use  Schizophrenia (HCC)  Hyperlipidemia  GERD (gastroesophageal reflux disease)  Bipolar disorder (HCC)  Hypokalemia  Leukocytosis  Protein-calorie malnutrition, severe (HCC)   Vital Signs:  BP 119/84   Pulse (!) 108   Temp 98.5 F (36.9 C) (Oral)   Resp 15   Ht 5' 5 (1.651 m)   Wt 59.6 kg   SpO2 98%   BMI 21.87 kg/m   Physical Exam Vitals and nursing note reviewed.  Constitutional:      General: She is not in acute distress.    Appearance: She is ill-appearing.     Comments: Appears very frail and weak   Cardiovascular:     Rate and Rhythm: Tachycardia present.  Pulmonary:     Effort: Pulmonary effort is normal. Tachypnea present.     Breath sounds: Examination of the right-upper field reveals wheezing. Examination of the left-upper field reveals decreased breath sounds. Examination of the right-middle field reveals wheezing. Examination of the left-middle field reveals decreased breath sounds. Examination of the right-lower  field reveals wheezing. Examination of the left-lower field reveals decreased breath sounds. Decreased breath sounds and wheezing present.     Comments: Noted high flow nasal cannula in place Abdominal:     General: Abdomen is flat.     Palpations: Abdomen is soft.   Neurological:     Mental Status: She is lethargic, disoriented and confused.     Comments: Not answering questions, essentially verbally nonresponsive  Psychiatric:        Mood and Affect: Mood normal.        Behavior: Behavior normal.     Palliative Assessment/Data: 10%   Existing Vynca/ACP Documentation: None  Advanced Care Planning:   Existing Vynca/ACP Documentation: None  Primary Decision Maker: LEGAL GUARDIAN  Pertinent diagnosis: Severe respiratory failure, severe sepsis due to pneumonia, acute PE  The patient and/or family consented to a voluntary Advance Care  Planning Conversation over the phone. Individuals present for the conversation: DSS guardian [name], patient's sister Randall, patient's son Marcey Gibbs [facility] DSS program director Alberta Estrin  Summary of the conversation: Discussed the patient's ongoing respiratory decline, tachypnea/increased work of breathing despite 40 L/min heated high flow oxygen, increasing oxygen demands, and the fact the patient is quite uncomfortable.  I again made the recommendation on behalf of the medical team for transition to comfort care.  Outcome of the conversations and/or documents completed: DSS is given authorization to transition to comfort care.  Patient's family is requesting to hold off on full transition until tomorrow afternoon so family can come today goodbye and see the patient.  We will add pain medication to help with dyspnea and continue current level of care with no escalation.  I spent 46 minutes providing separately identifiable ACP services with the patient and/or surrogate decision maker in a voluntary, in-person conversation discussing  the patient's wishes and goals as detailed in the above note.  Assessment & Plan:   HPI/Patient Profile:  73 y.o. female  with past medical history significant for hypotension on midodrine , HLD, left breast cancer, bipolar, anxiety, depression, schizophrenia, schizoaffective disorder, and previous suicide attempt.  She presented to ED via EMS 09/28/2023 from [facility] healthcare complaining of lethargy, hypotension and hypoxia.  EMS reported patient had temp of 99.6 axillary and worsening cough. ED workup showed Na+ 139, K+ 3.0, bicarb 23, BUN 7, creatinine 0.35, GFR > 60, WBC 11.6, Hgb 12.2, platelets 474, troponin 38 => 49.  COVID/flu A-B/RSV negative.  Lactic 1.3. BP 99/67, HR 112, RR 18, SpO2 92% on 2 L O2 and 98.3.   Patient was admitted to [facility] for management of severe sepsis 2/2 PNA, pyuria, multilobar lung infiltrate, acute PE and severe protein calorie malnutrition.   PMT was consulted for assistance with goals of care discussion.  10/02/2023 required emergent intubation and transfer to ICU.   6/18 Patient extubated  6/19 Deteriorated overnight requiring re-intubation  6/20 DSS guardian, [name], SW, assessed patient at bedside with CCM who recommended DNR/DNI with one-way extubation if patient fails to respond to medical treatment. DSS guardian to advise after she speaks with her supervisor regarding plan of care.  6/22 Failed SBT and wake up assessment.  After speaking with DSS legal guardian, decision was made to change CODE STATUS to DNR/DNI.  6/23 On minimal vent support, weaned off pressors and sedation discontinued.  Mental status currently precluding extubation. 6/24 extubated but having respiratory decline a few hours later, s/w DSS with recommendation for transition to comfort care, awaiting DSS decisions  SUMMARY OF RECOMMENDATIONS   DNR-limited Continue current scope of care Stop labs Add limited symptom management orders below Anticipate transition to full comfort  care tomorrow after family can visit Palliative medicine will continue to follow  Symptom Management:  Morphine  IV 1 to 2 mg every 2 hours as needed pain, dyspnea, respiratory distress  Code Status: DNR-limited  Prognosis: Hours - Days  Discharge Planning: Anticipated Hospital Death  Discussed with: Patient, medical team, nursing team, DSS guardian. DSS program director, patient's sister, patient's son  Thank you for allowing us  to participate in the care of Selena Pittman PMT will continue to support holistically.  Billing based on MDM: High  Problems Addressed: One acute or chronic illness or injury that poses a threat to life or bodily function  Amount and/or Complexity of Data: Category 1:Review of prior external note(s) from each unique source, Review of the result(s)  of each unique test, and Assessment requiring an independent historian(s) and Category 3:Discussion of management or test interpretation with external physician/other qualified health care professional/appropriate source (not separately reported)  Risks: N/A  Detailed review of medical records (labs, imaging, vital signs), medically appropriate exam, discussed with treatment team, counseling and education to patient, family, & staff, documenting clinical information, medication management, coordination of care  Camellia Kays, NP Palliative Medicine Team  Team Phone # [phone] (Nights/Weekends)  12/17/2020, 8:17 AM

## 2023-10-13 NOTE — Progress Notes (Signed)
 PHARMACY - ANTICOAGULATION CONSULT NOTE  Pharmacy Consult for IV Heparin  Indication: pulmonary embolus  Patient Measurements: Height: 5' 5 (165.1 cm) Weight: 59.6 kg (131 lb 6.3 oz) IBW/kg (Calculated) : 57 HEPARIN  DW (KG): 64  Labs: Recent Labs    10/11/23 0527 10/11/23 0645 10/12/23 0434 10/13/23 0320  HGB  --  8.6* 9.7* 9.7*  HCT  --  25.6* 30.0* 30.5*  PLT  --  238 242 217  HEPARINUNFRC 0.41  --  0.53 0.56  CREATININE <0.30*  --  <0.30* <0.30*    CrCl cannot be calculated (This lab value cannot be used to calculate CrCl because it is not a number: <0.30).   Medical History: Past Medical History:  Diagnosis Date   Bipolar affective (HCC)    Breast cancer (HCC)    Cancer (HCC) 12/11/2014   INVASIVE MAMMARY CARCINOMA/.left breast/ T2 N0   Cardiomegaly    Depression    Emphysema of lung (HCC)    Endometriosis    Hyperlipidemia    Osteoporosis    Schizoaffective disorder (HCC)    Schizophrenia (HCC)    Severe sepsis (HCC) 04/02/2022   Thyroid  nodule    Vitamin D  deficiency    Assessment: 73 y/o female presenting with altered mental status and hypoxia. PMH significant for bipolar disorder, breast cancer, anxiety, depression, hypothyroidism. CT imaging shows pulmonary embolism.Per chart review, patient is not on anticoagulation prior to admission. Pharmacy has been consulted to re- initiate and manage heparin  infusion.  Patient started Xarelto  (last dose 06/14 0825). Now transitioned back to heparin  drip.  Goal of Therapy:  Heparin  level 0.3-0.7 units/ml aPTT 66 - 102s Monitor platelets by anticoagulation protocol: Yes  06/16 0846 HL 0.13, aPTT 67s, subthera; 650 un/hr 06/16 2023 HL 0.24, subthera; 750 un/hr 06/17 0429 HL 0.31, therapeutic x 1 06/17 1244 HL 0.28, subthera; 850 un/hr 06/17 2252 HL 0.34, therapeutic x 1 06/18 0643 HL 0.40, therapeutic x 2 06/19 0540 HL 0.37, therapeutic X 3  06/20 0449 HL 0.53, therapeutic X 4  06/21 0510 HL 0.62,  therapeutic X 5  06/22 0417 HL 0.55, therapeutic X 6 06/23 0527 HL 0.41, therapeutic X 7 06/24 0434 HL 0.53, therapeutic x 8 06/25 0320 HL 0.56, therapeutic x 9 ------------------------------------------------ 06/22 2043 Heparin  drip restarted  Plan:  - continue pt on current rate and recheck HL on 6/26 with AM labs.  --CBC daily  Thank you for involving pharmacy in this patient's care.   Rankin CANDIE Dills, PharmD, [facility] 10/13/2023 6:06 AM

## 2023-10-14 DIAGNOSIS — Z515 Encounter for palliative care: Secondary | ICD-10-CM | POA: Diagnosis not present

## 2023-10-14 DIAGNOSIS — A4101 Sepsis due to Methicillin susceptible Staphylococcus aureus: Secondary | ICD-10-CM | POA: Diagnosis not present

## 2023-10-14 DIAGNOSIS — Z7189 Other specified counseling: Secondary | ICD-10-CM | POA: Diagnosis not present

## 2023-10-14 DIAGNOSIS — J189 Pneumonia, unspecified organism: Secondary | ICD-10-CM | POA: Diagnosis not present

## 2023-10-14 DIAGNOSIS — R531 Weakness: Secondary | ICD-10-CM | POA: Diagnosis not present

## 2023-10-14 DIAGNOSIS — Z66 Do not resuscitate: Secondary | ICD-10-CM | POA: Diagnosis not present

## 2023-10-14 LAB — CBC
HCT: 29.7 % — ABNORMAL LOW (ref 36.0–46.0)
Hemoglobin: 9.5 g/dL — ABNORMAL LOW (ref 12.0–15.0)
MCH: 32.8 pg (ref 26.0–34.0)
MCHC: 32 g/dL (ref 30.0–36.0)
MCV: 102.4 fL — ABNORMAL HIGH (ref 80.0–100.0)
Platelets: 195 10*3/uL (ref 150–400)
RBC: 2.9 MIL/uL — ABNORMAL LOW (ref 3.87–5.11)
RDW: 17.1 % — ABNORMAL HIGH (ref 11.5–15.5)
WBC: 8.1 10*3/uL (ref 4.0–10.5)
nRBC: 0.2 % (ref 0.0–0.2)

## 2023-10-14 LAB — HEPARIN LEVEL (UNFRACTIONATED): Heparin Unfractionated: 0.56 [IU]/mL (ref 0.30–0.70)

## 2023-10-14 LAB — BASIC METABOLIC PANEL WITH GFR
Anion gap: 7 (ref 5–15)
BUN: 12 mg/dL (ref 8–23)
CO2: 34 mmol/L — ABNORMAL HIGH (ref 22–32)
Calcium: 7.9 mg/dL — ABNORMAL LOW (ref 8.9–10.3)
Chloride: 103 mmol/L (ref 98–111)
Creatinine, Ser: <0.3 mg/dL — ABNORMAL LOW (ref 0.44–1.00)
Glucose, Bld: 116 mg/dL — ABNORMAL HIGH (ref 70–99)
Potassium: 4.1 mmol/L (ref 3.5–5.1)
Sodium: 144 mmol/L (ref 135–145)

## 2023-10-14 LAB — GLUCOSE, CAPILLARY
Glucose-Capillary: 113 mg/dL — ABNORMAL HIGH (ref 70–99)
Glucose-Capillary: 106 mg/dL — ABNORMAL HIGH (ref 70–99)

## 2023-10-14 MED ORDER — HALOPERIDOL LACTATE 2 MG/ML PO CONC: 0.5000 mg | ORAL | Status: DC | PRN

## 2023-10-14 MED ORDER — BIOTENE DRY MOUTH MT LIQD: 15.0000 mL | OROMUCOSAL | Status: DC | PRN

## 2023-10-14 MED ORDER — HALOPERIDOL LACTATE 5 MG/ML IJ SOLN: 0.5000 mg | INTRAMUSCULAR | Status: DC | PRN

## 2023-10-14 MED ORDER — LORAZEPAM 1 MG PO TABS: 1.0000 mg | ORAL_TABLET | ORAL | Status: DC | PRN

## 2023-10-14 MED ORDER — POLYVINYL ALCOHOL 1.4 % OP SOLN: 1.0000 [drp] | Freq: Four times a day (QID) | OPHTHALMIC | Status: DC | PRN

## 2023-10-14 MED ORDER — ACETAMINOPHEN 325 MG PO TABS: 650.0000 mg | ORAL_TABLET | Freq: Four times a day (QID) | ORAL | Status: DC | PRN

## 2023-10-14 MED ORDER — MORPHINE BOLUS VIA INFUSION
1.0000 mg | INTRAVENOUS | Status: DC | PRN
  Administered 2023-10-14 (×2): 2 mg via INTRAVENOUS
  Administered 2023-10-14 (×2): 3 mg via INTRAVENOUS
  Administered 2023-10-14 (×2): 2 mg via INTRAVENOUS

## 2023-10-14 MED ORDER — LORAZEPAM 2 MG/ML PO CONC: 1.0000 mg | ORAL | Status: DC | PRN

## 2023-10-14 MED ORDER — GLYCOPYRROLATE 0.2 MG/ML IJ SOLN: 0.2000 mg | INTRAMUSCULAR | Status: DC | PRN

## 2023-10-14 MED ORDER — LORAZEPAM 2 MG/ML IJ SOLN: 1.0000 mg | INTRAMUSCULAR | Status: DC | PRN

## 2023-10-14 MED ORDER — ACETAMINOPHEN 650 MG RE SUPP: 650.0000 mg | Freq: Four times a day (QID) | RECTAL | Status: DC | PRN

## 2023-10-14 MED ORDER — MORPHINE 100MG IN NS 100ML (1MG/ML) PREMIX INFUSION
2.0000 mg/h | INTRAVENOUS | Status: DC
  Administered 2023-10-14: 2 mg/h via INTRAVENOUS
  Filled 2023-10-14: qty 100

## 2023-10-14 MED ORDER — HALOPERIDOL 0.5 MG PO TABS: 0.5000 mg | ORAL_TABLET | ORAL | Status: DC | PRN

## 2023-10-14 MED ORDER — AMIODARONE IV BOLUS ONLY 150 MG/100ML
150.0000 mg | Freq: Once | INTRAVENOUS | Status: AC | PRN
  Administered 2023-10-14: 150 mg via INTRAVENOUS
  Filled 2023-10-14: qty 100

## 2023-10-14 MED ORDER — GLYCOPYRROLATE 1 MG PO TABS: 1.0000 mg | ORAL_TABLET | ORAL | Status: DC | PRN

## 2023-10-14 MED ORDER — ONDANSETRON HCL 4 MG/2ML IJ SOLN
4.0000 mg | Freq: Four times a day (QID) | INTRAMUSCULAR | Status: DC | PRN
Start: 2023-10-14 — End: 2023-10-14

## 2023-10-14 MED ORDER — ONDANSETRON 4 MG PO TBDP: 4.0000 mg | ORAL_TABLET | Freq: Four times a day (QID) | ORAL | Status: DC | PRN

## 2023-10-19 NOTE — Death Summary Note (Signed)
   DEATH SUMMARY   Patient Details  Name: Selena Pittman MRN: [id number] DOB: 1951/03/26 ERE:Opwiozb, Channing SQUIBB, FNP Admission/Discharge Information   Admit Date:  10-04-23  Date of Death: Date of Death: Oct 20, 2023  Time of Death: Time of Death: 10-31-18  Length of Stay: 11/09/2023   Principle Cause of death: respiratory failure  Hospital Diagnoses: Principal Problem:   Sepsis due to pneumonia [facility]) Active Problems:   Acute hypoxic respiratory failure (HCC)   Multilobar lung infiltrate   UTI (urinary tract infection)   Acute pulmonary embolism (HCC)   Right-sided sensory deficit present   Hypokalemia   Protein-calorie malnutrition, severe (HCC)   History of breast cancer   Tobacco use   GERD (gastroesophageal reflux disease)   Schizophrenia (HCC)   Leukocytosis   Weakness   Bipolar disorder (HCC)   Hyperlipidemia   Pressure injury of skin   Deep vein thrombosis (DVT) of femoral vein of both lower extremities [facility])  Hospital Course: Selena Pittman is a 73 y.o. female with a PMH significant for hypotension on midodrine , HLD, left breast cancer, bipolar, anxiety, depression, schizophrenia, schizoaffective disorder, and previous suicide attempt. She presented to ED via EMS 10-04-2023 from [facility] healthcare complaining of lethargy, hypotension and hypoxia.  She was admitted for severe sepsis 2/2 multilobar PNA, pyuria, acute PE and severe protein calorie malnutrition.  Intubated 6/14-6/18, reintubated 6/19-6/24. She was made DNR/DNI 6/22. She remained on high level of non-invasive respiratory support with difficulty maintaining proper O2 saturations. Intermittently responsive.  Completed antibiotic treatment of MSSA CAP and parainfluenza infection and ecoli UTI. Palliative is following, appreciate their care. Ongoing GOC discussions happening with DSS guardian. She has been approved to transition to comfort care by DSS legal guardian. She was started on a morphine  gtt to treat her  air hunger and discomfort.  Passed away shortly after.   Time spent: 35 minutes  Signed: Marien LITTIE Piety, MD 2023-10-20

## 2023-10-19 NOTE — Progress Notes (Signed)
 SLP Cancellation Note  Patient Details Name: Selena Pittman MRN: [id number] DOB: [birth year]   Cancelled treatment:       Reason Eval/Treat Not Completed: Medical issues which prohibited therapy. Chart review completed. Bedside swallow assessment attempted x2. Initially pt sleeping. Second attempt, pt's current respiratory requirements and severe risk for further decline with any PO intake prevented completion of assessment. SLP will continue to monitor, pending decision regarding comfort measures.   [place] Cobi Aldape Clapp, MS, CCC-SLP Speech Language Pathologist Rehab Services; [facility] [phone] (ascom)   [place] J Clapp 09/22/2023, 12:19 PM

## 2023-10-19 NOTE — TOC Progression Note (Signed)
 Transition of Care [facility]) - Progression Note    Patient Details  Name: Selena Pittman MRN: [id number] Date of Birth: [birth year]  Transition of Care [facility]) CM/SW Contact  Danie Diehl A Lambert Jeanty, RN Phone Number: 10/06/2023, 2:51 PM  Clinical Narrative:    Chart reviewed.  Noted that DSS is agreeable to implementing comfort care measures.  Family to visit patient today.  TOC will continue to provide support.     Expected Discharge Plan: Skilled Nursing Facility Barriers to Discharge: Continued Medical Work up  Expected Discharge Plan and Services       Living arrangements for the past 2 months: Skilled Nursing Facility                                       Social Determinants of Health (SDOH) Interventions SDOH Screenings   Food Insecurity: No Food Insecurity (10/29/2022)  Housing: Low Risk  (10/29/2022)  Transportation Needs: No Transportation Needs (10/29/2022)  Utilities: Not At Risk (10/29/2022)  Social Connections: Patient Unable To Answer (09/29/2023)  Tobacco Use: High Risk (09/28/2023)    Readmission Risk Interventions    09/30/2023   10:38 AM 07/30/2023   12:52 PM  Readmission Risk Prevention Plan  Transportation Screening Complete Complete  PCP or Specialist Appt within 3-5 Days Complete   HRI or Home Care Consult  Complete  Social Work Consult for Recovery Care Planning/Counseling Complete   Palliative Care Screening Not Applicable   Medication Review Oceanographer) Complete Complete

## 2023-10-19 NOTE — Progress Notes (Signed)
 Daily Progress Note   Date: 10/18/2023   Patient Name: Selena Pittman  DOB: [birth year]  MRN: [id number]  Age / Sex: 73 y.o., female  Attending Physician: Lenon Marien CROME, MD Primary Care Physician: Donal Channing SQUIBB, FNP Admit Date: 09/28/2023 Length of Stay: 16 days  Reason for Consultation: Establishing goals of care  Past Medical History:  Diagnosis Date   Bipolar affective (HCC)    Breast cancer (HCC)    Cancer (HCC) 12/11/2014   INVASIVE MAMMARY CARCINOMA/.left breast/ T2 N0   Cardiomegaly    Depression    Emphysema of lung (HCC)    Endometriosis    Hyperlipidemia    Osteoporosis    Schizoaffective disorder (HCC)    Schizophrenia (HCC)    Severe sepsis (HCC) 04/02/2022   Thyroid  nodule    Vitamin D  deficiency     Subjective:   Subjective: Chart Reviewed. Updates received. Patient Assessed. Created space and opportunity for patient  and family to explore thoughts and feelings regarding current medical situation.  Today's Discussion: Today before meeting with the patient/family, I reviewed the chart including family medicine note from today, PT note from today, SLP note from today.  Overnight the patient has had a significant respiratory decline, he is on high flow oxygen at this time.  I reviewed labs including BMP which shows normal electrolytes and kidney function, CBC with no leukocytosis, stable anemia with hemoglobin at 9.5.  Today before seeing the patient I was messaged by the nurse that she is having significant respiratory distress, IV morphine  pushes that were ordered yesterday is inadequate in controlling her symptoms.  They are concerned that she is suffering.  I called the patient's son to pass on the information and indicated to him that I would transition her to a morphine  drip for adequate symptom management.  However, I am concerned about her ability to survive until they are planning to arrive at 2:00.  He thanked me for the update and indicated he  would come to the hospital soon as possible as with his sister.  I shared that I would meet them there as soon as I could.  At this point I updated the nurse and entered morphine  drip orders.  Later in the day I was able to come see the patient around 1:00 or 130, both son and daughter were present along with their respective fiance's.  The patient was on a morphine  drip currently at 3 mg/h and appeared comfortable, although she does have agonal breathing and appears to be approaching end-of-life and actively dying.  I spent some time providing support to the patient's family, discussion about full transition to comfort care including weaning of oxygen and discontinuing other curative intent medications and diagnostics.  After discussion they are in agreement to transition to full comfort care.  I provided contact information to both children as well as the contact information to the DSS legal guardian as requested by family.  I indicated that palliative medicine will continue to follow daily while the patient is on comfort care and admitted to the hospital.  I provided emotional and general support through therapeutic listening, empathy, sharing of stories, and other techniques. I answered all questions and addressed all concerns to the best of my ability.  Review of Systems  Unable to perform ROS   Objective:   Primary Diagnoses: Present on Admission:  Sepsis due to pneumonia (HCC)  Tobacco use  Schizophrenia (HCC)  Hyperlipidemia  GERD (gastroesophageal reflux disease)  Bipolar disorder (HCC)  Hypokalemia  Leukocytosis  Protein-calorie malnutrition, severe (HCC)   Vital Signs:  BP (!) 143/109   Pulse (!) 134   Temp 98 F (36.7 C) (Axillary)   Resp (!) 22   Ht 5' 5 (1.651 m)   Wt 59.8 kg   SpO2 (!) 86%   BMI 21.94 kg/m   Physical Exam Vitals and nursing note reviewed.  Constitutional:      General: She is not in acute distress.    Appearance: She is ill-appearing.      Comments: Appears very frail and weak   Cardiovascular:     Rate and Rhythm: Tachycardia present.  Pulmonary:     Effort: Tachypnea present.     Breath sounds: Examination of the left-upper field reveals decreased breath sounds. Examination of the left-middle field reveals decreased breath sounds. Examination of the left-lower field reveals decreased breath sounds. Decreased breath sounds present.     Comments: Agonal breathing, appears to be actively dying Abdominal:     General: Abdomen is flat.   Neurological:     Mental Status: She is lethargic and confused.     Comments: Not answering questions, essentially verbally nonresponsive    Palliative Assessment/Data: 10%   Existing Vynca/ACP Documentation: None  Assessment & Plan:   HPI/Patient Profile:  73 y.o. female  with past medical history significant for hypotension on midodrine , HLD, left breast cancer, bipolar, anxiety, depression, schizophrenia, schizoaffective disorder, and previous suicide attempt.  She presented to ED via EMS 09/28/2023 from [facility] complaining of lethargy, hypotension and hypoxia.  EMS reported patient had temp of 99.6 axillary and worsening cough. ED workup showed Na+ 139, K+ 3.0, bicarb 23, BUN 7, creatinine 0.35, GFR > 60, WBC 11.6, Hgb 12.2, platelets 474, troponin 38 => 49.  COVID/flu A-B/RSV negative.  Lactic 1.3. BP 99/67, HR 112, RR 18, SpO2 92% on 2 L O2 and 98.3.   Patient was admitted to [facility] for management of severe sepsis 2/2 PNA, pyuria, multilobar lung infiltrate, acute PE and severe protein calorie malnutrition.   PMT was consulted for assistance with goals of care discussion.  10/02/2023 required emergent intubation and transfer to ICU.   6/18 Patient extubated  6/19 Deteriorated overnight requiring re-intubation  6/20 DSS guardian, [name], SW, assessed patient at bedside with CCM who recommended DNR/DNI with one-way extubation if patient fails to respond to medical  treatment. DSS guardian to advise after she speaks with her supervisor regarding plan of care.  6/22 Failed SBT and wake up assessment.  After speaking with DSS legal guardian, decision was made to change CODE STATUS to DNR/DNI.  6/23 On minimal vent support, weaned off pressors and sedation discontinued.  Mental status currently precluding extubation. 6/24 extubated but having respiratory decline a few hours later, s/w DSS with recommendation for transition to comfort care, awaiting DSS decisions 6/35 transitioned to full comfort care and morphine  drip  SUMMARY OF RECOMMENDATIONS   DNR-limited Transition to full comfort care See symptom management orders below Ongoing emotional support of patient's family Palliative medicine will continue to follow  Symptom Management:  STOPPED morphine  IV 1 to 2 mg every 2 hours as needed pain, dyspnea, respiratory distress ADDED morphine  drip 2 to 20 mg/h titrate per instructions ADDED morphine  bolus via infusion 1 to 4 mg IV Q 50 minutes as needed per instructions Tylenol  650 mg PR every 6 hours as needed mild pain or fever Biotene solution 15 mL topical as needed dry mouth Robinul  0.2 mg IV every  4 hours as needed excessive secretions Haldol 0.5 mg IV every 4 hours as needed agitation or delirium Ativan 1 mg IV every 4 hours as needed anxiety Zofran  4 mg IV every 6 hours as needed nausea or vomiting  Code Status: DNR-comfort  Prognosis: Hours - Days  Discharge Planning: Anticipated Hospital Death  Discussed with: Patient, medical team, nursing team, patient's family  Thank you for allowing us  to participate in the care of Tomasa Toure PMT will continue to support holistically.  Billing based on MDM: High  Problems Addressed: One acute or chronic illness or injury that poses a threat to life or bodily function  Amount and/or Complexity of Data: Category 1:Review of prior external note(s) from each unique source, Review of the  result(s) of each unique test, and Assessment requiring an independent historian(s) and Category 3:Discussion of management or test interpretation with external physician/other qualified health care professional/appropriate source (not separately reported)  Risks: Parenteral controlled substances  Detailed review of medical records (labs, imaging, vital signs), medically appropriate exam, discussed with treatment team, counseling and education to patient, family, & staff, documenting clinical information, medication management, coordination of care  Camellia Kays, NP Palliative Medicine Team  Team Phone # [phone] (Nights/Weekends)  12/17/2020, 8:17 AM

## 2023-10-19 NOTE — Progress Notes (Signed)
 PHARMACY - ANTICOAGULATION CONSULT NOTE  Pharmacy Consult for IV Heparin  Indication: pulmonary embolus  Patient Measurements: Height: 5' 5 (165.1 cm) Weight: 59.8 kg (131 lb 13.4 oz) IBW/kg (Calculated) : 57 HEPARIN  DW (KG): 64  Labs: Recent Labs    10/12/23 0434 10/13/23 0320 09/25/2023 0602  HGB 9.7* 9.7* 9.5*  HCT 30.0* 30.5* 29.7*  PLT 242 217 195  HEPARINUNFRC 0.53 0.56 0.56  CREATININE <0.30* <0.30* <0.30*    CrCl cannot be calculated (This lab value cannot be used to calculate CrCl because it is not a number: <0.30).   Medical History: Past Medical History:  Diagnosis Date   Bipolar affective (HCC)    Breast cancer (HCC)    Cancer (HCC) 12/11/2014   INVASIVE MAMMARY CARCINOMA/.left breast/ T2 N0   Cardiomegaly    Depression    Emphysema of lung (HCC)    Endometriosis    Hyperlipidemia    Osteoporosis    Schizoaffective disorder (HCC)    Schizophrenia (HCC)    Severe sepsis (HCC) 04/02/2022   Thyroid  nodule    Vitamin D  deficiency    Assessment: 73 y/o female presenting with altered mental status and hypoxia. PMH significant for bipolar disorder, breast cancer, anxiety, depression, hypothyroidism. CT imaging shows pulmonary embolism.Per chart review, patient is not on anticoagulation prior to admission. Pharmacy has been consulted to re- initiate and manage heparin  infusion.  Patient started Xarelto  (last dose 06/14 0825). Now transitioned back to heparin  drip.  Goal of Therapy:  Heparin  level 0.3-0.7 units/ml aPTT 66 - 102s Monitor platelets by anticoagulation protocol: Yes  06/16 0846 HL 0.13, aPTT 67s, subthera; 650 un/hr 06/16 2023 HL 0.24, subthera; 750 un/hr 06/17 0429 HL 0.31, therapeutic x 1 06/17 1244 HL 0.28, subthera; 850 un/hr 06/17 2252 HL 0.34, therapeutic x 1 06/18 0643 HL 0.40, therapeutic x 2 06/19 0540 HL 0.37, therapeutic X 3  06/20 0449 HL 0.53, therapeutic X 4  06/21 0510 HL 0.62, therapeutic X 5  06/22 0417 HL 0.55,  therapeutic X 6 06/23 0527 HL 0.41, therapeutic X 7 06/24 0434 HL 0.53, therapeutic x 8 06/25 0320 HL 0.56, therapeutic x 9 06/26 0602 HL 0.56, therapeutic x 10 ------------------------------------------------ 06/22 2043 Heparin  drip restarted  Plan:  --Current plan is to transition pt to comfort care 6/26 in the PM --continue pt on current rate and recheck HL on 6/27 with AM labs if heparin  not D/C'd.  --CBC daily  Thank you for involving pharmacy in this patient's care.   Rankin CANDIE Dills, PharmD, [facility] 10/15/2023 6:41 AM

## 2023-10-19 NOTE — Progress Notes (Signed)
 Nutrition Brief Note  Chart reviewed. Pt now transitioning to comfort care.  No further nutrition interventions planned at this time.  Please re-consult as needed.   Levada Schilling, RD, LDN, CDCES Registered Dietitian III Certified Diabetes Care and Education Specialist If unable to reach this RD, please use "RD Inpatient" group chat on secure chat between hours of 8am-4 pm daily

## 2023-10-19 NOTE — Progress Notes (Signed)
 PROGRESS NOTE Selena Pittman    DOB: 1950-05-14, 73 y.o.  FMW:[id number]    Code Status: Limited: Do not attempt resuscitation (DNR) -DNR-LIMITED -Do Not Intubate/DNI    DOA: 09/28/2023   LOS: 16  Brief hospital course  Selena Pittman is a 73 y.o. female with a PMH significant for hypotension on midodrine , HLD, left breast cancer, bipolar, anxiety, depression, schizophrenia, schizoaffective disorder, and previous suicide attempt. She presented to ED via EMS 09/28/2023 from [facility] healthcare complaining of lethargy, hypotension and hypoxia.  She was admitted for severe sepsis 2/2 multilobar PNA, pyuria, acute PE and severe protein calorie malnutrition.  Intubated 6/14-6/18, reintubated 6/19-6/24. She was made DNR/DNI 6/22. She currently is on high level of non-invasive respiratory support.  Alert and responding with short responses, difficult to understand, able to follow commands.  Completed antibiotic treatment of MSSA CAP and parainfluenza infection and ecoli UTI. Palliative is following, appreciate their care. Ongoing GOC discussions happening with DSS guardian. She has been approved to transition to comfort care by DSS legal guardian and son is attempting to see her today prior to passing away.   Assessment & Plan  Principal Problem:   Sepsis due to pneumonia [facility]) Active Problems:   Acute hypoxic respiratory failure (HCC)   Multilobar lung infiltrate   UTI (urinary tract infection)   Acute pulmonary embolism (HCC)   Right-sided sensory deficit present   Hypokalemia   Protein-calorie malnutrition, severe (HCC)   History of breast cancer   Tobacco use   GERD (gastroesophageal reflux disease)   Schizophrenia (HCC)   Leukocytosis   Weakness   Bipolar disorder (HCC)   Hyperlipidemia   Pressure injury of skin   Deep vein thrombosis (DVT) of femoral vein of both lower extremities (HCC)  GOC- invasive, life-prolonging treatments to be weaned off and comfort focus treatments  today as she is transitioned to comfort care.  - appreciate palliative care managing transition to comfort care. Family members were encouraged to come to bedside to see her as her condition is worsening  #Acute Hypoxic Respiratory Failure due to #MSSA Pneumonia & Rhinovirus/Parainfluenza Viral Pneumonia #Pulmonary Embolism Intubated 6/14-8/18, reintubated 6/19-6/24. She was bade DNR/DNI 6/22. She currently is on high level of non-invasive respiratory support.  Alert and following commands. Currently on 15L HFNC Completed antibiotic treatment of MSSA CAP and parainfluenza infection and ecoli UTI. - remains on heparin  gtt for PE treatment.  - chest xray 6/20 demonstrated improvement in appearance of pneumonia.  - intermittently received diuretics to treat volume overload but limited with electrolyte abnormalities. Has generalized mild anasarca related to prolonged ICU/bed-bound status.  - palliative following, appreciate your care  NPO- unable to tolerate PO when intubated and had NG placed. Will consider resuming PO comfort deeds and removing NG tube today as she transitions to comfort care - NG tube in place for feeds, RD consulted - SLP consult for PO recs - pharmacy consult for electrolyte management    #Shock: Septic ~ RESOLVED.  #Mildly Elevated Troponin due to demand ischemia- peaked at 49  PMHx: HTN, HLD -Echocardiogram 09/29/23: LVEF 60-65%, normal diastolic parameters, RV systolic function not well visualized, RV size normal -Continuous cardiac monitoring until fully transitioned to comfort care -Vasopressors weaned off, -Continue Midodrine  5 mg TID    #ESBL E. Coli UTI ~ TREATED. Foley in place.  -Completed course of ABX   #Bilateral DVT's #Pulmonary Embolism  -Monitor for S/Sx of bleeding -Trend CBC -Heparin  gtt for AC/VTE treatment    #Acute Metabolic Encephalopathy #Sedation  needs in setting of mechanical ventilation PMHx: Bipolar disorder, schizophrenia,  depression -Treatment of metabolic derangements and sepsis as outlined above -Avoid sedating medications as able  Body mass index is 21.94 kg/m.  VTE ppx: Place TED hose Start: 09/28/23 1701  Diet:     Diet   Diet NPO time specified   Consultants: CCM Palliative   Subjective 10/09/2023    Pt is trying to communicate with me but excessive excretions and dysarthria make it so I'm unable to understand what she is trying to say after several attempts to illicit her wishes. She demonstrates ability to understand what I'm saying and following commands.    Objective  Blood pressure 125/89, pulse (!) 107, temperature 98.6 F (37 C), temperature source Oral, resp. rate 15, height 5' 5 (1.651 m), weight 59.6 kg, SpO2 100%.  Intake/Output Summary (Last 24 hours) at 10/08/2023 0746 Last data filed at 09/25/2023 0320 Gross per 24 hour  Intake 1197.82 ml  Output 1575 ml  Net -377.18 ml   Filed Weights   10/12/23 0315 10/13/23 0400 10/18/2023 0315  Weight: 60.6 kg 59.6 kg 59.8 kg    Physical Exam:  General: awake, alert, mild distress HEENT: atraumatic, clear conjunctiva, anicteric sclera, thick mucus secretions Respiratory: normal respiratory effort. Decreased respirations. Rales, stertor present Cardiovascular: quick capillary refill, normal S1/S2, mildly increase rate Gastrointestinal: soft, NT, ND. Rectal tube in place, cath in place Nervous: alert, following commands. Can illicit movement in all limbs but minimal  Extremities: pitting edema diffusely  Skin: dry, intact, normal temperature, normal color. No rashes, lesions or ulcers on exposed skin  Labs   I have personally reviewed the following labs and imaging studies CBC    Component Value Date/Time   WBC 8.1 09/25/2023 0602   RBC 2.90 (L) 10/15/2023 0602   HGB 9.5 (L) 09/21/2023 0602   HCT 29.7 (L) 10/18/2023 0602   PLT 195 09/21/2023 0602   MCV 102.4 (H) 09/26/2023 0602   MCH 32.8 10/11/2023 0602   MCHC 32.0  09/29/2023 0602   RDW 17.1 (H) 10/09/2023 0602   LYMPHSABS 0.4 (L) 10/12/2023 0434   MONOABS 0.4 10/12/2023 0434   EOSABS 0.0 10/12/2023 0434   BASOSABS 0.0 10/12/2023 0434      Latest Ref Rng & Units 10/08/2023    6:02 AM 10/13/2023    3:20 AM 10/12/2023    4:34 AM  BMP  Glucose 70 - 99 mg/dL 883  878  866   BUN 8 - 23 mg/dL 12  13  13    Creatinine 0.44 - 1.00 mg/dL <9.69  <9.69  <9.69   Sodium 135 - 145 mmol/L 144  147  148   Potassium 3.5 - 5.1 mmol/L 4.1  3.8  3.7   Chloride 98 - 111 mmol/L 103  107  109   CO2 22 - 32 mmol/L 34  32  34   Calcium  8.9 - 10.3 mg/dL 7.9  7.7  7.8    No results found.  Disposition Plan & Communication  Patient status: Inpatient  Admitted From: SNF Planned disposition location: TBD Anticipated discharge date: TBD pending GOC  Family Communication: none at bedside    Author: Marien LITTIE Piety, DO [facility] 09/29/2023, 7:46 AM   Available by Epic secure chat 7AM-7PM. If 7PM-7AM, please contact night-coverage.  [facility] contact information found on [email or web address].

## 2023-10-19 NOTE — Progress Notes (Signed)
 PHARMACY CONSULT NOTE  Pharmacy Consult for Electrolyte Monitoring and Replacement   Recent Labs: Potassium (mmol/L)  Date Value  10/16/2023 4.1  12/31/2014 4.0   Magnesium  (mg/dL)  Date Value  93/74/7974 2.4   Calcium  (mg/dL)  Date Value  93/73/7974 7.9 (L)   Albumin  (g/dL)  Date Value  93/77/7974 2.2 (L)   Phosphorus (mg/dL)  Date Value  93/74/7974 2.9   Sodium (mmol/L)  Date Value  09/24/2023 144   Assessment: 73 y/o female presenting with altered mental status and hypoxia. PMH significant for bipolar disorder, breast cancer, anxiety, depression, hypothyroidism. Pharmacy is asked to follow and replace electrolytes while in CCU  Goal of Therapy:  Electrolytes WNL  Plan:  --no electrolyte replacement warranted for today --no more labs ISO transitioning to comfort care   Adriana JONETTA Bolster 10/13/2023 7:07 AM

## 2023-10-19 NOTE — Progress Notes (Signed)
   09/30/2023 1310  Spiritual Encounters  Type of Visit Initial  Care provided to: Pt and family  Conversation partners present during encounter Physician  Referral source Physician  Reason for visit Code  OnCall Visit Yes  Spiritual Framework  Presenting Themes Coping tools;Impactful experiences and emotions  Interventions  Spiritual Care Interventions Made Established relationship of care and support;Compassionate presence;Reflective listening;Normalization of emotions;Prayer  Intervention Outcomes  Outcomes Connection to spiritual care;Awareness around self/spiritual resourses;Connection to values and goals of care  Spiritual Care Plan  Spiritual Care Issues Still Outstanding Referring to oncoming chaplain for further support   Chaplain responded to page for comfort only measures. Oldest son asked chaplain to pray and we spent time reflecting on pts life.

## 2023-10-19 NOTE — Progress Notes (Signed)
   10/05/2023 1630  Spiritual Encounters  Type of Visit Follow up  Care provided to: Family  Reason for visit End-of-life  OnCall Visit No  Interventions  Spiritual Care Interventions Made Compassionate presence;Prayer  Spiritual Care Plan  Spiritual Care Issues Still Outstanding No further spiritual care needs at this time (see row info)   Chaplain attended family at end of life, said a prayer and took granddaughter to room after volunteer training.

## 2023-10-19 NOTE — Progress Notes (Addendum)
 PT Cancellation Note  Patient Details Name: Selena Pittman MRN: [id number] DOB: 1950/12/31   Cancelled Treatment:     Pt looks to have ordersfor comfort measures. Will sign off. Please re-order if plan of care changes.    Rankin KATHEE Essex 10/03/2023, 12:19 PM

## 2023-10-19 DEATH — deceased
